# Patient Record
Sex: Male | Born: 1965
Health system: Southern US, Community
[De-identification: ages and names within clinical notes are randomized; demographics above are authoritative.]

## PROBLEM LIST (undated history)

## (undated) DIAGNOSIS — N400 Enlarged prostate without lower urinary tract symptoms: Secondary | ICD-10-CM

## (undated) DIAGNOSIS — R972 Elevated prostate specific antigen [PSA]: Secondary | ICD-10-CM

## (undated) DIAGNOSIS — G4730 Sleep apnea, unspecified: Secondary | ICD-10-CM

## (undated) DIAGNOSIS — C61 Malignant neoplasm of prostate: Secondary | ICD-10-CM

## (undated) DIAGNOSIS — N529 Male erectile dysfunction, unspecified: Secondary | ICD-10-CM

## (undated) DIAGNOSIS — E669 Obesity, unspecified: Secondary | ICD-10-CM

## (undated) DIAGNOSIS — R339 Retention of urine, unspecified: Secondary | ICD-10-CM

## (undated) DIAGNOSIS — I10 Essential (primary) hypertension: Secondary | ICD-10-CM

## (undated) HISTORY — DX: Benign prostatic hyperplasia without lower urinary tract symptoms: N40.0

## (undated) HISTORY — DX: Retention of urine, unspecified: R33.9

## (undated) HISTORY — DX: Male erectile dysfunction, unspecified: N52.9

## (undated) HISTORY — DX: Elevated prostate specific antigen (PSA): R97.20

---
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
## 2000-05-11 ENCOUNTER — Emergency Department (HOSPITAL_COMMUNITY): Admission: EM | Admit: 2000-05-11 | Discharge: 2000-05-11 | Payer: Self-pay | Admitting: Emergency Medicine

## 2002-06-07 ENCOUNTER — Emergency Department (HOSPITAL_COMMUNITY): Admission: EM | Admit: 2002-06-07 | Discharge: 2002-06-07 | Payer: Self-pay | Admitting: Emergency Medicine

## 2002-06-07 ENCOUNTER — Encounter: Payer: Self-pay | Admitting: Emergency Medicine

## 2005-04-09 ENCOUNTER — Emergency Department (HOSPITAL_COMMUNITY): Admission: EM | Admit: 2005-04-09 | Discharge: 2005-04-09 | Payer: Self-pay | Admitting: Emergency Medicine

## 2006-10-01 ENCOUNTER — Emergency Department (HOSPITAL_COMMUNITY): Admission: EM | Admit: 2006-10-01 | Discharge: 2006-10-01 | Payer: Self-pay | Admitting: Emergency Medicine

## 2009-01-28 ENCOUNTER — Emergency Department (HOSPITAL_COMMUNITY): Admission: EM | Admit: 2009-01-28 | Discharge: 2009-01-28 | Payer: Self-pay | Admitting: Emergency Medicine

## 2010-04-02 ENCOUNTER — Emergency Department (HOSPITAL_COMMUNITY): Admission: EM | Admit: 2010-04-02 | Discharge: 2010-04-02 | Payer: Self-pay | Admitting: Emergency Medicine

## 2013-09-20 ENCOUNTER — Encounter (HOSPITAL_COMMUNITY): Payer: Self-pay | Admitting: *Deleted

## 2013-09-20 ENCOUNTER — Emergency Department (HOSPITAL_COMMUNITY)
Admission: EM | Admit: 2013-09-20 | Discharge: 2013-09-20 | Disposition: A | Discharge status: Home / Self Care | Payer: Self-pay | Attending: Emergency Medicine | Admitting: Emergency Medicine

## 2013-09-20 ENCOUNTER — Emergency Department (HOSPITAL_COMMUNITY): Payer: Self-pay

## 2013-09-20 DIAGNOSIS — Z79899 Other long term (current) drug therapy: Secondary | ICD-10-CM | POA: Insufficient documentation

## 2013-09-20 DIAGNOSIS — M7651 Patellar tendinitis, right knee: Secondary | ICD-10-CM

## 2013-09-20 DIAGNOSIS — M25469 Effusion, unspecified knee: Secondary | ICD-10-CM | POA: Insufficient documentation

## 2013-09-20 DIAGNOSIS — E669 Obesity, unspecified: Secondary | ICD-10-CM | POA: Insufficient documentation

## 2013-09-20 DIAGNOSIS — I10 Essential (primary) hypertension: Secondary | ICD-10-CM | POA: Insufficient documentation

## 2013-09-20 DIAGNOSIS — Z7982 Long term (current) use of aspirin: Secondary | ICD-10-CM | POA: Insufficient documentation

## 2013-09-20 DIAGNOSIS — M7650 Patellar tendinitis, unspecified knee: Secondary | ICD-10-CM | POA: Insufficient documentation

## 2013-09-20 HISTORY — DX: Essential (primary) hypertension: I10

## 2013-09-20 MED ORDER — NAPROXEN 500 MG PO TABS: 500.0000 mg | ORAL_TABLET | Freq: Two times a day (BID) | ORAL | Status: DC

## 2013-09-20 MED ORDER — HYDROCODONE-ACETAMINOPHEN 5-325 MG PO TABS: 1.0000 | ORAL_TABLET | Freq: Four times a day (QID) | ORAL | Status: DC | PRN

## 2013-09-20 NOTE — Progress Notes (Signed)
Orthopedic Tech Progress Note Patient Details:  Craig Collins 1966-10-26 [id number]  Ortho Devices Type of Ortho Device: Knee Immobilizer Ortho Device/Splint Location: RLE Ortho Device/Splint Interventions: Ordered;Application   Jennye Moccasin 09/20/2013, 9:02 PM

## 2013-09-20 NOTE — ED Notes (Signed)
Pt reports pain and swelling to right knee that started yesterday, denies injury. No relief with ibuprofen.

## 2013-09-20 NOTE — ED Provider Notes (Signed)
CSN: [id number]     Arrival date & time 09/20/13  1849 History  This chart was scribed for non-physician practitioner Felicie Morn, NP, working with Toy Baker, MD by Ronal Fear, ED scribe. This patient was seen in room TR10C/TR10C and the patient's care was started at 8:19 PM.    Chief Complaint  Patient presents with  . Knee Pain  . Joint Swelling    Patient is a 47 y.o. male presenting with knee pain. The history is provided by the patient. No language interpreter was used.  Knee Pain Location:  Knee Time since incident:  1 day Injury: no   Knee location:  R knee Pain details:    Quality:  Aching   Radiates to:  Does not radiate   Severity:  Mild   Onset quality:  Sudden   Timing:  Intermittent Chronicity:  Recurrent Dislocation: no   Prior injury to area:  No Relieved by:  Nothing Worsened by:  Activity Ineffective treatments:  NSAIDs Associated symptoms: stiffness and swelling   Associated symptoms: no muscle weakness and no numbness    Pt has a hx of high BP Past Medical History  Diagnosis Date  . Obesity   . Hypertension    History reviewed. No pertinent past surgical history. History reviewed. No pertinent family history. History  Substance Use Topics  . Smoking status: Not on file  . Smokeless tobacco: Not on file  . Alcohol Use: No    Review of Systems  Musculoskeletal: Positive for joint swelling and stiffness.  All other systems reviewed and are negative.    Allergies  Review of patient's allergies indicates no known allergies.  Home Medications   Current Outpatient Rx  Name  Route  Sig  Dispense  Refill  . aspirin 325 MG buffered tablet   Oral   Take 325 mg by mouth daily.         . hydrALAZINE (APRESOLINE) 10 MG tablet   Oral   Take 10 mg by mouth 3 (three) times daily.         Marland Kitchen lisinopril (PRINIVIL,ZESTRIL) 20 MG tablet   Oral   Take 20 mg by mouth daily.          BP 138/90  Pulse 86  Temp(Src) 98.4 F (36.9 C) (Oral)   Resp 19  SpO2 97% Physical Exam  Nursing note and vitals reviewed. Constitutional: He is oriented to person, place, and time. He appears well-developed and well-nourished. No distress.  HENT:  Head: Normocephalic and atraumatic.  Eyes: EOM are normal.  Neck: Neck supple. No tracheal deviation present.  Cardiovascular: Normal rate.   Pulmonary/Chest: Effort normal. No respiratory distress.  Musculoskeletal: Normal range of motion. He exhibits tenderness.  Left knee patella tendon tenderness with inflammation. Not red or warm  Neurological: He is alert and oriented to person, place, and time.  Skin: Skin is warm and dry.  Psychiatric: He has a normal mood and affect. His behavior is normal.    ED Course  Procedures (including critical care time)  DIAGNOSTIC STUDIES: Oxygen Saturation is 97% on RA, adequate by my interpretation.    COORDINATION OF CARE: 8:21 PM- Pt advised of plan for treatment including immobilizer, narcotic pain medication, anti inflammatory medication and follow-up with PCP and pt agrees.       Labs Review Labs Reviewed - No data to display Imaging Review Dg Knee Complete 4 Views Right  09/20/2013   CLINICAL DATA:  Right knee pain and swelling.  EXAM: RIGHT KNEE - COMPLETE 4+ VIEW  COMPARISON:  None.  FINDINGS: There is no evidence of fracture, dislocation, or joint effusion. Minimal proliferative changes are seen involving tibial spines and the superior patella. No bony lesions or destruction identified. Soft tissues are unremarkable.  IMPRESSION: Minimal degenerative changes. No acute findings.   Electronically Signed   By: Irish Lack   On: 09/20/2013 20:01   Radiology results reviewed and shared with patient. MDM  Right knee pain, suspect patellar tendonitis.   I personally performed the services described in this documentation, which was scribed in my presence. The recorded information has been reviewed and is accurate.    Jimmye Norman,  NP 09/21/13 [phone]

## 2013-09-23 NOTE — ED Provider Notes (Signed)
Medical screening examination/treatment/procedure(s) were performed by non-physician practitioner and as supervising physician I was immediately available for consultation/collaboration.  Toy Baker, MD 09/23/13 [phone]

## 2013-12-31 ENCOUNTER — Emergency Department (HOSPITAL_COMMUNITY)
Admission: EM | Admit: 2013-12-31 | Discharge: 2013-12-31 | Disposition: A | Discharge status: Home / Self Care | Payer: Self-pay | Attending: Emergency Medicine | Admitting: Emergency Medicine

## 2013-12-31 ENCOUNTER — Emergency Department (HOSPITAL_COMMUNITY): Payer: Self-pay

## 2013-12-31 ENCOUNTER — Encounter (HOSPITAL_COMMUNITY): Payer: Self-pay | Admitting: Emergency Medicine

## 2013-12-31 DIAGNOSIS — E669 Obesity, unspecified: Secondary | ICD-10-CM | POA: Insufficient documentation

## 2013-12-31 DIAGNOSIS — Z79899 Other long term (current) drug therapy: Secondary | ICD-10-CM | POA: Insufficient documentation

## 2013-12-31 DIAGNOSIS — Z791 Long term (current) use of non-steroidal anti-inflammatories (NSAID): Secondary | ICD-10-CM | POA: Insufficient documentation

## 2013-12-31 DIAGNOSIS — M25476 Effusion, unspecified foot: Secondary | ICD-10-CM | POA: Insufficient documentation

## 2013-12-31 DIAGNOSIS — Z7982 Long term (current) use of aspirin: Secondary | ICD-10-CM | POA: Insufficient documentation

## 2013-12-31 DIAGNOSIS — M25572 Pain in left ankle and joints of left foot: Secondary | ICD-10-CM

## 2013-12-31 DIAGNOSIS — M25473 Effusion, unspecified ankle: Secondary | ICD-10-CM | POA: Insufficient documentation

## 2013-12-31 DIAGNOSIS — M6580 Other synovitis and tenosynovitis, unspecified site: Secondary | ICD-10-CM | POA: Insufficient documentation

## 2013-12-31 DIAGNOSIS — I10 Essential (primary) hypertension: Secondary | ICD-10-CM | POA: Insufficient documentation

## 2013-12-31 DIAGNOSIS — M67969 Unspecified disorder of synovium and tendon, unspecified lower leg: Secondary | ICD-10-CM

## 2013-12-31 DIAGNOSIS — M6790 Unspecified disorder of synovium and tendon, unspecified site: Secondary | ICD-10-CM

## 2013-12-31 HISTORY — DX: Sleep apnea, unspecified: G47.30

## 2013-12-31 MED ORDER — MELOXICAM 15 MG PO TABS: 15.0000 mg | ORAL_TABLET | Freq: Every day | ORAL | Status: DC

## 2013-12-31 NOTE — ED Provider Notes (Signed)
Medical screening examination/treatment/procedure(s) were performed by non-physician practitioner and as supervising physician I was immediately available for consultation/collaboration.  EKG Interpretation   None         Elmer Sow, MD 12/31/13 2103

## 2013-12-31 NOTE — ED Provider Notes (Signed)
CSN: [id number]     Arrival date & time 12/31/13  75 History   First MD Initiated Contact with Patient 12/31/13 1116     Chief Complaint  Patient presents with  . Foot Pain   (Consider location/radiation/quality/duration/timing/severity/associated sxs/prior Treatment) HPI Comments: This is patient with several days of left ankle pain.  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  He states that the pain in his left ankle is in the anterior portion.  It is worse with standing and walking, relieved with rest and ice.  The patient also has noticed some swelling.  He said his ankle is very tender to palpation.  He thought he might have gout however he was tested at his primary care providers for elevated uric acid and did not have it.  The patient denies any history of gonococcal infection or urethritis.  She denies fevers, chills, myalgias, arthralgias.  No history of DVT, no unilateral calf swelling or pain.  Patient is a 48 y.o. male presenting with ankle pain.  Ankle Pain Location:  Ankle and foot Injury: no   Ankle location:  L ankle (anterior tendons) Foot location:  Dorsum of L foot Pain details:    Quality:  Aching and shooting   Radiates to:  L leg   Severity:  Moderate   Progression:  Unchanged Prior injury to area:  No Relieved by:  NSAIDs, rest and ice Worsened by:  Bearing weight and activity (flex/ext/inversion of ankle, flexion of big toe) Associated symptoms: decreased ROM and swelling   Associated symptoms: no back pain, no fatigue, no fever, no itching, no muscle weakness, no neck pain, no numbness and no tingling     Past Medical History  Diagnosis Date  . Obesity   . Hypertension   . Sleep apnea    History reviewed. No pertinent past surgical history. History reviewed. No pertinent family history. History  Substance Use Topics  . Smoking status: Never Smoker   . Smokeless tobacco: Not on file  . Alcohol Use: No    Review of  Systems  Constitutional: Negative for fever and fatigue.  Musculoskeletal: Negative for back pain and neck pain.  Skin: Negative for itching and rash.    Allergies  Review of patient's allergies indicates no known allergies.  Home Medications   Current Outpatient Rx  Name  Route  Sig  Dispense  Refill  . aspirin 325 MG buffered tablet   Oral   Take 325 mg by mouth daily.         . hydrALAZINE (APRESOLINE) 10 MG tablet   Oral   Take 10 mg by mouth 3 (three) times daily.         Marland Kitchen HYDROcodone-acetaminophen (NORCO/VICODIN) 5-325 MG per tablet   Oral   Take 1 tablet by mouth every 6 (six) hours as needed for pain (for pain not controlled with naprosyn).   10 tablet   0   . lisinopril (PRINIVIL,ZESTRIL) 20 MG tablet   Oral   Take 20 mg by mouth daily.         . naproxen (NAPROSYN) 500 MG tablet   Oral   Take 1 tablet (500 mg total) by mouth 2 (two) times daily.   20 tablet   0    BP 130/82  Pulse 90  Temp(Src) 98.6 F (37 C) (Oral)  Resp 22  SpO2 98% Physical Exam Physical Exam  Nursing note and vitals reviewed. Constitutional: He appears well-developed  and well-nourished. No distress.  obese HENT:  Head: Normocephalic and atraumatic.  Eyes: Conjunctivae normal are normal. No scleral icterus.  Neck: Normal range of motion. Neck supple.  Cardiovascular: Normal rate, regular rhythm and normal heart sounds.   Pulmonary/Chest: Effort normal and breath sounds normal. No respiratory distress.  Abdominal: Soft. There is no tenderness.  Musculoskeletal: He exhibits no edema.  Neurological: He is alert.  Skin: Skin is warm and dry. He is not diaphoretic.  Musculoskeletal: Patient with limited range of motion of the left ankle due to pain.  He is exquisitely tender to palpation over the anterior ankle flexors.  Patient has pain with flexion extension and inversion of the ankle.  No medial malleoli are tenderness.  Distal pulses and sensation are intact.  There is  minimal swelling, no redness, no signs of infection.  Antalgic gait.   Psychiatric: His behavior is normal.    ED Course  Procedures (including critical care time) Labs Review Labs Reviewed - No data to display Imaging Review No results found.  EKG Interpretation   None       MDM   1. Ankle pain, left   2. Lower extremity tendinopathy    Patient with complaint of left foot and ankle pain.  I suspect the anterior ankle tendinopathy.  The patient will have x-ray of the ankle.   Filed Vitals:   12/31/13 1108 12/31/13 1131 12/31/13 1230 12/31/13 1307  BP: 149/90 130/82 120/74 117/81  Pulse: 87 90 79 77  Temp: 99.1 F (37.3 C) 98.6 F (37 C)    TempSrc: Oral Oral    Resp: 16 22 20 20   SpO2: 96% 98% 98% 95%   Patient xray negative for acute abnormality I personally reviewed the images using our PACS system. Patient will be discharged with conservative treatment for tendinopathy including Cam Walker boot.  He is advised to ice 3 times a day along with high-dose anti-inflammatories for 10 days.  I have advised patient to follow up with PCP and podiatry regarding his likely tendinopathy. The patient appears reasonably screened and/or stabilized for discharge and I doubt any other medical condition or other [facility] requiring further screening, evaluation, or treatment in the ED at this time prior to discharge.    Margarita Mail, PA-C 12/31/13 1347

## 2013-12-31 NOTE — Progress Notes (Signed)
Orthopedic Tech Progress Note Patient Details:  Craig Collins Aug 06, 1966 [id number]  Ortho Devices Type of Ortho Device: CAM walker Ortho Device/Splint Location: lle Ortho Device/Splint Interventions: Application   Lareen Mullings 12/31/2013, 1:43 PM

## 2013-12-31 NOTE — Discharge Instructions (Signed)
Please wear the walking boot during waking hours. You ma remove the boot for sleep. Ice the affected area 3 times daily. Take the mobic in the morning with food.  follow up with podiatry   Ankle Pain Ankle pain is a common symptom. The bones, cartilage, tendons, and muscles of the ankle joint perform a lot of work each day. The ankle joint holds your body weight and allows you to move around. Ankle pain can occur on either side or back of 1 or both ankles. Ankle pain may be sharp and burning or dull and aching. There may be tenderness, stiffness, redness, or warmth around the ankle. The pain occurs more often when a person walks or puts pressure on the ankle. CAUSES  There are many reasons ankle pain can develop. It is important to work with your caregiver to identify the cause since many conditions can impact the bones, cartilage, muscles, and tendons. Causes for ankle pain include:  Injury, including a break (fracture), sprain, or strain often due to a fall, sports, or a high-impact activity.  Swelling (inflammation) of a tendon (tendonitis).  Achilles tendon rupture.  Ankle instability after repeated sprains and strains.  Poor foot alignment.  Pressure on a nerve (tarsal tunnel syndrome).  Arthritis in the ankle or the lining of the ankle.  Crystal formation in the ankle (gout or pseudogout). DIAGNOSIS  A diagnosis is based on your medical history, your symptoms, results of your physical exam, and results of diagnostic tests. Diagnostic tests may include X-ray exams or a computerized magnetic scan (magnetic resonance imaging, MRI). TREATMENT  Treatment will depend on the cause of your ankle pain and may include:  Keeping pressure off the ankle and limiting activities.  Using crutches or other walking support (a cane or brace).  Using rest, ice, compression, and elevation.  Participating in physical therapy or home exercises.  Wearing shoe inserts or special shoes.  Losing  weight.  Taking medications to reduce pain or swelling or receiving an injection.  Undergoing surgery. HOME CARE INSTRUCTIONS   Only take over-the-counter or prescription medicines for pain, discomfort, or fever as directed by your caregiver.  Put ice on the injured area.  Put ice in a plastic bag.  Place a towel between your skin and the bag.  Leave the ice on for 15-20 minutes at a time, 03-04 times a day.  Keep your leg raised (elevated) when possible to lessen swelling.  Avoid activities that cause ankle pain.  Follow specific exercises as directed by your caregiver.  Record how often you have ankle pain, the location of the pain, and what it feels like. This information may be helpful to you and your caregiver.  Ask your caregiver about returning to work or sports and whether you should drive.  Follow up with your caregiver for further examination, therapy, or testing as directed. SEEK MEDICAL CARE IF:   Pain or swelling continues or worsens beyond 1 week.  You have an oral temperature above 102 F (38.9 C).  You are feeling unwell or have chills.  You are having an increasingly difficult time with walking.  You have loss of sensation or other new symptoms.  You have questions or concerns. MAKE SURE YOU:   Understand these instructions.  Will watch your condition.  Will get help right away if you are not doing well or get worse. Document Released: 05/28/2010 Document Revised: 03/01/2012 Document Reviewed: 05/28/2010 [facility] Patient Information 2014 [place].

## 2013-12-31 NOTE — ED Notes (Addendum)
Pt c/o L foot and ankle pain and swelling for past several days. Reports he was here in past for similar pain and diagnosed with tendon inflammation. 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 Taking ibuprofen with some relief. Hurts worse to elevate

## 2014-01-05 ENCOUNTER — Encounter (HOSPITAL_COMMUNITY): Payer: Self-pay | Admitting: Emergency Medicine

## 2014-01-05 ENCOUNTER — Emergency Department (HOSPITAL_COMMUNITY)
Admission: EM | Admit: 2014-01-05 | Discharge: 2014-01-05 | Disposition: A | Discharge status: Home / Self Care | Payer: Self-pay | Attending: Emergency Medicine | Admitting: Emergency Medicine

## 2014-01-05 DIAGNOSIS — M25579 Pain in unspecified ankle and joints of unspecified foot: Secondary | ICD-10-CM | POA: Insufficient documentation

## 2014-01-05 DIAGNOSIS — M7989 Other specified soft tissue disorders: Secondary | ICD-10-CM

## 2014-01-05 DIAGNOSIS — Z791 Long term (current) use of non-steroidal anti-inflammatories (NSAID): Secondary | ICD-10-CM | POA: Insufficient documentation

## 2014-01-05 DIAGNOSIS — M25572 Pain in left ankle and joints of left foot: Secondary | ICD-10-CM

## 2014-01-05 DIAGNOSIS — Z7982 Long term (current) use of aspirin: Secondary | ICD-10-CM | POA: Insufficient documentation

## 2014-01-05 DIAGNOSIS — Z79899 Other long term (current) drug therapy: Secondary | ICD-10-CM | POA: Insufficient documentation

## 2014-01-05 DIAGNOSIS — E669 Obesity, unspecified: Secondary | ICD-10-CM | POA: Insufficient documentation

## 2014-01-05 DIAGNOSIS — M25473 Effusion, unspecified ankle: Secondary | ICD-10-CM | POA: Insufficient documentation

## 2014-01-05 DIAGNOSIS — M25476 Effusion, unspecified foot: Principal | ICD-10-CM | POA: Insufficient documentation

## 2014-01-05 DIAGNOSIS — I10 Essential (primary) hypertension: Secondary | ICD-10-CM | POA: Insufficient documentation

## 2014-01-05 MED ORDER — IBUPROFEN 800 MG PO TABS: 800.0000 mg | ORAL_TABLET | Freq: Three times a day (TID) | ORAL | Status: DC

## 2014-01-05 MED ORDER — OXYCODONE-ACETAMINOPHEN 5-325 MG PO TABS
2.0000 | ORAL_TABLET | Freq: Once | ORAL | Status: AC
  Administered 2014-01-05: 2 via ORAL
  Filled 2014-01-05: qty 2

## 2014-01-05 MED ORDER — CEPHALEXIN 500 MG PO CAPS: 500.0000 mg | ORAL_CAPSULE | Freq: Four times a day (QID) | ORAL | Status: DC

## 2014-01-05 MED ORDER — OXYCODONE-ACETAMINOPHEN 5-325 MG PO TABS: 2.0000 | ORAL_TABLET | ORAL | Status: DC | PRN

## 2014-01-05 MED ORDER — CYCLOBENZAPRINE HCL 10 MG PO TABS: 10.0000 mg | ORAL_TABLET | Freq: Two times a day (BID) | ORAL | Status: DC | PRN

## 2014-01-05 NOTE — ED Provider Notes (Signed)
Medical screening examination/treatment/procedure(s) were conducted as a shared visit with non-physician practitioner(s) and myself.  I personally evaluated the patient during the encounter.  EKG Interpretation   None      Patient with left foot swelling and tenderness. Seen a few days ago, told he had a tendinopathy. No relief after using a Cam Walker. Patient has had this before and it was an unknown etiology. He was worked up for gout and it was not gout. He denies any trauma. He denies any fever or systemic symptoms. His left foot is swollen, but his foot is soft. No tight compartments. No concern for compartment syndrome. Skin is not red, there are no cellulitic changes. No concern for infection. He did say he did have an infection once and was treated with antibiotics and improved. He has good cap refill distally and good sensation. I'm unsure of the etiology of his foot swelling. He had a negative x-ray 2 days ago. Patient given pain medicine, anti-inflammatories. He is also given Keflex in case this becomes red, he understands to not fill it if it does not needed. Patient stable for discharge. Given orthopedic followup  Osvaldo Shipper, MD 01/05/14 2328

## 2014-01-05 NOTE — ED Notes (Signed)
Pt was seen here on 1/10 for left foot and ankle pain. Had xray done and sent home with meds and cam walker on left foot. Reports no relief with meds and increase in pain and swelling.

## 2014-01-05 NOTE — ED Notes (Signed)
PA at bedside.

## 2014-01-05 NOTE — Discharge Instructions (Signed)
1. Medications: percocet, flexeril, ibuprofen, Keflex (only fill this if your foot becomes red), usual home medications 2. Treatment: rest, drink plenty of fluids, gentle stretching as discussed, use ice 3. Follow Up: Please followup with your primary doctor for discussion of your diagnoses and further evaluation after today's visit; if you do not have a primary care doctor use the resource guide provided to find one;   Arthralgia Your caregiver has diagnosed you as suffering from an arthralgia. Arthralgia means there is pain in a joint. This can come from many reasons including:  Bruising the joint which causes soreness (inflammation) in the joint.  Wear and tear on the joints which occur as we grow older (osteoarthritis).  Overusing the joint.  Various forms of arthritis.  Infections of the joint. Regardless of the cause of pain in your joint, most of these different pains respond to anti-inflammatory drugs and rest. The exception to this is when a joint is infected, and these cases are treated with antibiotics, if it is a bacterial infection. HOME CARE INSTRUCTIONS   Rest the injured area for as long as directed by your caregiver. Then slowly start using the joint as directed by your caregiver and as the pain allows. Crutches as directed may be useful if the ankles, knees or hips are involved. If the knee was splinted or casted, continue use and care as directed. If an stretchy or elastic wrapping bandage has been applied today, it should be removed and re-applied every 3 to 4 hours. It should not be applied tightly, but firmly enough to keep swelling down. Watch toes and feet for swelling, bluish discoloration, coldness, numbness or excessive pain. If any of these problems (symptoms) occur, remove the ace bandage and re-apply more loosely. If these symptoms persist, contact your caregiver or return to this location.  For the first 24 hours, keep the injured extremity elevated on pillows  while lying down.  Apply ice for 15-20 minutes to the sore joint every couple hours while awake for the first half day. Then 03-04 times per day for the first 48 hours. Put the ice in a plastic bag and place a towel between the bag of ice and your skin.  Wear any splinting, casting, elastic bandage applications, or slings as instructed.  Only take over-the-counter or prescription medicines for pain, discomfort, or fever as directed by your caregiver. Do not use aspirin immediately after the injury unless instructed by your physician. Aspirin can cause increased bleeding and bruising of the tissues.  If you were given crutches, continue to use them as instructed and do not resume weight bearing on the sore joint until instructed. Persistent pain and inability to use the sore joint as directed for more than 2 to 3 days are warning signs indicating that you should see a caregiver for a follow-up visit as soon as possible. Initially, a hairline fracture (break in bone) may not be evident on X-rays. Persistent pain and swelling indicate that further evaluation, non-weight bearing or use of the joint (use of crutches or slings as instructed), or further X-rays are indicated. X-rays may sometimes not show a small fracture until a week or 10 days later. Make a follow-up appointment with your own caregiver or one to whom we have referred you. A radiologist (specialist in reading X-rays) may read your X-rays. Make sure you know how you are to obtain your X-ray results. Do not assume everything is normal if you do not hear from [place]. SEEK MEDICAL CARE IF:  Bruising, swelling, or pain increases. SEEK IMMEDIATE MEDICAL CARE IF:   Your fingers or toes are numb or blue.  The pain is not responding to medications and continues to stay the same or get worse.  The pain in your joint becomes severe.  You develop a fever over 102 F (38.9 C).  It becomes impossible to move or use the joint. MAKE SURE YOU:    Understand these instructions.  Will watch your condition.  Will get help right away if you are not doing well or get worse. Document Released: 12/08/2005 Document Revised: 03/01/2012 Document Reviewed: 07/26/2008 [facility] Patient Information 2014 [place].

## 2014-01-05 NOTE — ED Provider Notes (Signed)
CSN: [id number]     Arrival date & time 01/05/14  1755 History   First MD Initiated Contact with Patient 01/05/14 1921     Chief Complaint  Patient presents with  . Leg Swelling   (Consider location/radiation/quality/duration/timing/severity/associated sxs/prior Treatment) The history is provided by the patient and medical records. No language interpreter was used.    Craig Collins is a 48 y.o. male  with a hx of obesity, hypertension, sleep apnea presents to the Emergency Department complaining of gradual, persistent, progressively worsening left foot pain onset approximately 1 week ago. Associated symptoms include swelling and pain with ambulation.  Patient has attended taking meloxicam and Naprosyn without improvement. Nothing makes it better and walking makes it worse.  Pt denies fever, chills, headache, neck pain, chest pain, shortness of breath abdominal pain, nausea, vomiting, swelling of the legs, numbness, tingling, weakness, syncope.  Record review shows the patient was seen on 01/01/2012 with negative x-ray. I have personally reviewed this imaging.  He was diagnosed with tendinopathy that time and placed in a cam walker. Patient reports this is making his pain worse.  He reports 4 episodes the last 6 months of similar swelling and pain but all less intense than today's symptoms..     Past Medical History  Diagnosis Date  . Obesity   . Hypertension   . Sleep apnea    History reviewed. No pertinent past surgical history. History reviewed. No pertinent family history. History  Substance Use Topics  . Smoking status: Never Smoker   . Smokeless tobacco: Not on file  . Alcohol Use: No    Review of Systems  Constitutional: Negative for fever and chills.  Gastrointestinal: Negative for nausea and vomiting.  Musculoskeletal: Positive for arthralgias and joint swelling. Negative for back pain, neck pain and neck stiffness.  Skin: Negative for wound.  Neurological: Negative for  numbness.  Hematological: Does not bruise/bleed easily.  Psychiatric/Behavioral: The patient is not nervous/anxious.   All other systems reviewed and are negative.    Allergies  Review of patient's allergies indicates no known allergies.  Home Medications   Current Outpatient Rx  Name  Route  Sig  Dispense  Refill  . aspirin EC 81 MG tablet   Oral   Take 81 mg by mouth daily.         . hydrALAZINE (APRESOLINE) 10 MG tablet   Oral   Take 10 mg by mouth daily.          Marland Kitchen lisinopril (PRINIVIL,ZESTRIL) 20 MG tablet   Oral   Take 20 mg by mouth daily.         . meloxicam (MOBIC) 15 MG tablet   Oral   Take 1 tablet (15 mg total) by mouth daily. Take 1 daily with food.   10 tablet   0   . naproxen (NAPROSYN) 500 MG tablet   Oral   Take 500 mg by mouth 2 (two) times daily.         . cephALEXin (KEFLEX) 500 MG capsule   Oral   Take 1 capsule (500 mg total) by mouth 4 (four) times daily.   40 capsule   0   . cyclobenzaprine (FLEXERIL) 10 MG tablet   Oral   Take 1 tablet (10 mg total) by mouth 2 (two) times daily as needed for muscle spasms.   20 tablet   0   . ibuprofen (ADVIL,MOTRIN) 800 MG tablet   Oral   Take 1 tablet (800 mg total)  by mouth 3 (three) times daily.   21 tablet   0   . oxyCODONE-acetaminophen (PERCOCET/ROXICET) 5-325 MG per tablet   Oral   Take 2 tablets by mouth every 4 (four) hours as needed for severe pain.   6 tablet   0    BP 118/75  Pulse 101  Temp(Src) 98.1 F (36.7 C) (Oral)  Resp 22  SpO2 99% Physical Exam  Nursing note and vitals reviewed. Constitutional: He is oriented to person, place, and time. He appears well-developed and well-nourished. No distress.  HENT:  Head: Normocephalic and atraumatic.  Eyes: Conjunctivae are normal.  Neck: Normal range of motion.  Cardiovascular: Normal rate, regular rhythm, normal heart sounds and intact distal pulses.   No murmur heard. Capillary refill less than 3 seconds   Pulmonary/Chest: Effort normal and breath sounds normal.  Musculoskeletal: He exhibits edema and tenderness.  ROM: Decreased range of motion of the left ankle secondary to pain in the dorsum of the foot, decreased range of motion in the toes for the same reason, full range of motion in the knee  1+ pitting edema in the dorsum of the foot but not in the calf ankle or toes  Neurological: He is alert and oriented to person, place, and time. Coordination normal.  Sensation intact to dull and sharp Strength 2/5 in the left foot and ankle including dorsiflexion and plantar flexion secondary to pain, 5 out of 5 in the right foot and ankle  Skin: Skin is warm and dry. He is not diaphoretic.  No tenting of the skin  Psychiatric: He has a normal mood and affect.    ED Course  Procedures (including critical care time) Labs Review Labs Reviewed - No data to display Imaging Review No results found.  EKG Interpretation   None       MDM   1. Foot swelling   2. Arthralgia of foot, left      [name] presents with persistent and worsening arthralgia and swelling of the left foot.  Patient without erythema or increased warmth. No evidence of cellulitis as patient has no induration or fluctuance. No evidence of gout and patient has no history of the same. Patient denies trauma and with negative x-rays no indication for fracture.   Unknown etiology of patient's pain and swelling. Patient without calf tenderness or calf pain. No evidence of DVT. Pain managed in ED. Pt advised to follow up with PCP for further evaluation. Patient given post-op shoe and crutches while in ED, conservative therapy recommended and discussed. Patient will be dc home & is agreeable with above plan.  It has been determined that no acute conditions requiring further emergency intervention are present at this time. The patient/guardian have been advised of the diagnosis and plan. We have discussed signs and symptoms  that warrant return to the ED, such as changes or worsening in symptoms.   Vital signs are stable at discharge.   BP 118/75  Pulse 101  Temp(Src) 98.1 F (36.7 C) (Oral)  Resp 22  SpO2 99%  Patient/guardian has voiced understanding and agreed to follow-up with the PCP or specialist.    I personally performed the services described in this documentation, which was scribed in my presence. The recorded information has been reviewed and is accurate.      Jarrett Soho Slater Mcmanaman, PA-C 01/05/14 2102

## 2014-10-15 ENCOUNTER — Encounter (HOSPITAL_COMMUNITY): Payer: Self-pay | Admitting: Emergency Medicine

## 2014-10-15 ENCOUNTER — Emergency Department (HOSPITAL_COMMUNITY)
Admission: EM | Admit: 2014-10-15 | Discharge: 2014-10-15 | Disposition: A | Discharge status: Home / Self Care | Payer: Self-pay | Attending: Emergency Medicine | Admitting: Emergency Medicine

## 2014-10-15 DIAGNOSIS — Z8669 Personal history of other diseases of the nervous system and sense organs: Secondary | ICD-10-CM | POA: Insufficient documentation

## 2014-10-15 DIAGNOSIS — Z7982 Long term (current) use of aspirin: Secondary | ICD-10-CM | POA: Insufficient documentation

## 2014-10-15 DIAGNOSIS — Z791 Long term (current) use of non-steroidal anti-inflammatories (NSAID): Secondary | ICD-10-CM | POA: Insufficient documentation

## 2014-10-15 DIAGNOSIS — I10 Essential (primary) hypertension: Secondary | ICD-10-CM | POA: Insufficient documentation

## 2014-10-15 DIAGNOSIS — E669 Obesity, unspecified: Secondary | ICD-10-CM | POA: Insufficient documentation

## 2014-10-15 DIAGNOSIS — M25462 Effusion, left knee: Secondary | ICD-10-CM | POA: Insufficient documentation

## 2014-10-15 DIAGNOSIS — M25562 Pain in left knee: Secondary | ICD-10-CM | POA: Insufficient documentation

## 2014-10-15 DIAGNOSIS — Z792 Long term (current) use of antibiotics: Secondary | ICD-10-CM | POA: Insufficient documentation

## 2014-10-15 DIAGNOSIS — Z79899 Other long term (current) drug therapy: Secondary | ICD-10-CM | POA: Insufficient documentation

## 2014-10-15 DIAGNOSIS — R52 Pain, unspecified: Secondary | ICD-10-CM

## 2014-10-15 MED ORDER — HYDROCODONE-ACETAMINOPHEN 5-325 MG PO TABS: ORAL_TABLET | ORAL | Status: DC

## 2014-10-15 MED ORDER — MELOXICAM 15 MG PO TABS: 15.0000 mg | ORAL_TABLET | Freq: Every day | ORAL | Status: DC

## 2014-10-15 MED ORDER — HYDROCODONE-ACETAMINOPHEN 5-325 MG PO TABS
1.0000 | ORAL_TABLET | Freq: Once | ORAL | Status: AC
  Administered 2014-10-15: 1 via ORAL
  Filled 2014-10-15: qty 1

## 2014-10-15 NOTE — ED Provider Notes (Signed)
CSN: [id number]     Arrival date & time 10/15/14  1259 History  This chart was scribed for Carlisle Cater, PA-C, working with Blanchie Dessert, MD by Steva Colder, ED Scribe. The patient was seen in room TR05C/TR05C at 1:22 PM.    Chief Complaint  Patient presents with  . Knee Pain    The history is provided by the patient. No language interpreter was used.   HPI Comments: Craig Collins is a 48 y.o. male who presents to the Emergency Department complaining of left knee pain onset 3 days. He states that he has been working out in [employer] on the treadmill. He states that he just started back working out for the past 3 weeks. He states that the knee feels better when he is standing just not while he is sitting. He states that he is not able to bend the knee. He states that the last time he was here, he was informed that he didn't have gout. He states that he is having associated symptoms of joint swelling. He states that he has tried Ibuprofen and Muscle relaxer medication with mild relief for his symptoms. He states that he last took one at 6 this morning. He denies symptoms in toes and any other symptoms. He states that he does not have an Orthopedic. He states that he takes medications for his HTN and they are lisinopril and hydralazine. He denies kidney and liver issues.   Past Medical History  Diagnosis Date  . Obesity   . Hypertension   . Sleep apnea    History reviewed. No pertinent past surgical history. History reviewed. No pertinent family history. History  Substance Use Topics  . Smoking status: Never Smoker   . Smokeless tobacco: Not on file  . Alcohol Use: Yes     Comment: occ    Review of Systems  Constitutional: Negative for activity change.  Musculoskeletal: Positive for arthralgias (left knee pain), gait problem and joint swelling (right knee). Negative for back pain and neck pain.  Skin: Negative for wound.  Neurological: Negative for weakness and numbness.       Allergies  Review of patient's allergies indicates no known allergies.  Home Medications   Prior to Admission medications   Medication Sig Start Date End Date Taking? Authorizing Provider  aspirin EC 81 MG tablet Take 81 mg by mouth daily.    Historical Provider, MD  cephALEXin (KEFLEX) 500 MG capsule Take 1 capsule (500 mg total) by mouth 4 (four) times daily. 01/05/14   Hannah Muthersbaugh, PA-C  cyclobenzaprine (FLEXERIL) 10 MG tablet Take 1 tablet (10 mg total) by mouth 2 (two) times daily as needed for muscle spasms. 01/05/14   Hannah Muthersbaugh, PA-C  hydrALAZINE (APRESOLINE) 10 MG tablet Take 10 mg by mouth daily.     Historical Provider, MD  ibuprofen (ADVIL,MOTRIN) 800 MG tablet Take 1 tablet (800 mg total) by mouth 3 (three) times daily. 01/05/14   Hannah Muthersbaugh, PA-C  lisinopril (PRINIVIL,ZESTRIL) 20 MG tablet Take 20 mg by mouth daily.    Historical Provider, MD  meloxicam (MOBIC) 15 MG tablet Take 1 tablet (15 mg total) by mouth daily. Take 1 daily with food. 12/31/13   Margarita Mail, PA-C  naproxen (NAPROSYN) 500 MG tablet Take 500 mg by mouth 2 (two) times daily. 09/20/13   Norman Herrlich, NP  oxyCODONE-acetaminophen (PERCOCET/ROXICET) 5-325 MG per tablet Take 2 tablets by mouth every 4 (four) hours as needed for severe pain. 01/05/14  Hannah Muthersbaugh, PA-C   BP 148/102  Pulse 98  Temp(Src) 97.8 F (36.6 C) (Oral)  Resp 20  SpO2 95%  Physical Exam  Nursing note and vitals reviewed. Constitutional: He appears well-developed and well-nourished. No distress.  HENT:  Head: Normocephalic and atraumatic.  Eyes: Conjunctivae and EOM are normal.  Neck: Normal range of motion. Neck supple. No tracheal deviation present.  Cardiovascular: Normal rate and normal pulses.   Pulses:      Dorsalis pedis pulses are 2+ on the left side.       Posterior tibial pulses are 2+ on the left side.  Pulmonary/Chest: Effort normal. No respiratory distress.   Musculoskeletal: He exhibits tenderness. He exhibits no edema.       Left knee: He exhibits decreased range of motion (slightly decreased in flexion) and effusion (mild). He exhibits no ecchymosis, no deformity, no erythema and no bony tenderness. Tenderness found. Medial joint line and lateral joint line tenderness noted.       Left ankle: Normal.       Left upper leg: Normal.       Left lower leg: Normal.       Left foot: Normal. He exhibits normal range of motion and no tenderness.  Neurological: He is alert. No sensory deficit.  Motor, sensation, and vascular distal to the injury is fully intact.   Skin: Skin is warm and dry.  Psychiatric: He has a normal mood and affect. His behavior is normal.    ED Course  Procedures (including critical care time) DIAGNOSTIC STUDIES: Oxygen Saturation is 95% on room air, normal by my interpretation.    COORDINATION OF CARE: 1:28 PM-Discussed treatment plan which includes recheck in 1 week if the symptoms persists with an Orthopedist, Meloxicam, and Vicodin with pt at bedside and pt agreed to plan.   Labs Review Labs Reviewed - No data to display  Imaging Review No results found.   EKG Interpretation None      Vital signs reviewed and are as follows: Filed Vitals:   10/15/14 1348  BP: 154/104  Pulse: 99  Temp:   Resp: 20   Patient was counseled on RICE protocol and told to rest injury, use ice for no longer than 15 minutes every hour, compress the area, and elevate above the level of their heart as much as possible to reduce swelling. Questions answered. Patient verbalized understanding.    Patient counseled on use of narcotic pain medications. Counseled not to combine these medications with others containing tylenol. Urged not to drink alcohol, drive, or perform any other activities that requires focus while taking these medications. The patient verbalizes understanding and agrees with the plan.    MDM   Final diagnoses:  Pain  aggravated by activities of daily living  Knee pain, acute, left  Knee effusion, left   Patient with knee effusion, likely secondary to overuse injury from recent new exercise. Patient likely has osteoarthritis. No significant decreased range of motion or erythema, other systemic symptoms to suggest septic arthritis. Gout/pseudogout is considered possible, but less likely.  I personally performed the services described in this documentation, which was scribed in my presence. The recorded information has been reviewed and is accurate.    Carlisle Cater, PA-C 10/15/14 1415

## 2014-10-15 NOTE — ED Provider Notes (Signed)
Medical screening examination/treatment/procedure(s) were performed by non-physician practitioner and as supervising physician I was immediately available for consultation/collaboration.   EKG Interpretation None        Blanchie Dessert, MD 10/15/14 1623

## 2014-10-15 NOTE — ED Notes (Signed)
Pt c/o left knee pain x 3 days; pt sts hx of chronic issues with same knee; pt unsure of obvious injury and some swelling noted

## 2014-10-15 NOTE — Discharge Instructions (Signed)
Please read and follow all provided instructions.  Your diagnoses today include:  1. Pain aggravated by activities of daily living   2. Knee pain, acute, left   3. Knee effusion, left     Tests performed today include:  Vital signs. See below for your results today.   Medications prescribed:   Vicodin (hydrocodone/acetaminophen) - narcotic pain medication  DO NOT drive or perform any activities that require you to be awake and alert because this medicine can make you drowsy. BE VERY CAREFUL not to take multiple medicines containing Tylenol (also called acetaminophen). Doing so can lead to an overdose which can damage your liver and cause liver failure and possibly death.   Meloxicam - anti-inflammatory pain medication  You have been prescribed an anti-inflammatory medication or NSAID. Take with food. Do not take aspirin, ibuprofen, or naproxen if taking this medication. Take smallest effective dose for the shortest duration needed for your pain. Stop taking if you experience stomach pain or vomiting.   Take any prescribed medications only as directed.  Home care instructions:   Follow any educational materials contained in this packet  Follow R.I.C.E. Protocol:  R - rest your injury   I  - use ice on injury without applying directly to skin  C - compress injury with bandage or splint  E - elevate the injury as much as possible  Follow-up instructions: Please follow-up with your primary care provider or the provided orthopedic physician (bone specialist) if you continue to have significant pain or trouble walking in 1 week. In this case you may have a severe injury that requires further care.   Return instructions:   Please return if your toes are numb or tingling, appear gray or blue, or you have severe pain (also elevate leg and loosen splint or wrap if you were given one)  Please return to the Emergency Department if you experience worsening symptoms.   Please return  if you have any other emergent concerns.  Additional Information:  Your vital signs today were: BP 148/102   Pulse 98   Temp(Src) 97.8 F (36.6 C) (Oral)   Resp 20   SpO2 95% If your blood pressure (BP) was elevated above 135/85 this visit, please have this repeated by your doctor within one month. -------------- If prescribed crutches for your injury: use crutches with non-weight bearing for the first few days. Then, you may walk as the pain allows, or as instructed. Start gradually with weight bearing on the affected side. Once you can walk pain free, then try jogging. When you can run forwards, then you can try moving side-to-side. If you cannot walk without crutches in one week, you need a re-check. --------------

## 2016-11-18 ENCOUNTER — Emergency Department (HOSPITAL_COMMUNITY)
Admission: EM | Admit: 2016-11-18 | Discharge: 2016-11-18 | Disposition: A | Discharge status: Home / Self Care | Payer: Self-pay | Attending: Emergency Medicine | Admitting: Emergency Medicine

## 2016-11-18 ENCOUNTER — Encounter (HOSPITAL_COMMUNITY): Payer: Self-pay | Admitting: *Deleted

## 2016-11-18 DIAGNOSIS — I10 Essential (primary) hypertension: Secondary | ICD-10-CM | POA: Insufficient documentation

## 2016-11-18 DIAGNOSIS — Z7982 Long term (current) use of aspirin: Secondary | ICD-10-CM | POA: Insufficient documentation

## 2016-11-18 DIAGNOSIS — B309 Viral conjunctivitis, unspecified: Secondary | ICD-10-CM | POA: Insufficient documentation

## 2016-11-18 NOTE — ED Notes (Signed)
Patient is A&Ox4 at this time.  Patient in no signs of distress.  Please see providers note for complete history and physical exam.  

## 2016-11-18 NOTE — ED Triage Notes (Signed)
Pt reports a recent cold. States that he woke this morning with reddness and drainage to rt eye

## 2016-11-18 NOTE — ED Provider Notes (Signed)
[facility] DEPT Provider Note   CSN: [id number] Arrival date & time: 11/18/16  1810  By signing my name below, I, [name], attest that this documentation has been prepared under the direction and in the presence of Virgel Manifold, MD . Electronically Signed: [name], Scribe. 11/18/2016. 7:39 PM.  History   Chief Complaint Chief Complaint  Patient presents with  . Eye Drainage   The history is provided by the patient. No language interpreter was used.    HPI Comments:  Craig Collins is a 50 y.o. male who presents to the Emergency Department complaining of eye redness and irritation to the right eye since this AM. He describes a griyty/sandy sensation in the eye. Pt reports associated teraing from the right eye. No blurry vision. No alleviating factors noted.  No use of corrective lenses or contacts. Pt notes he recently had cough and congestion; symptoms have improved at this time.   Past Medical History:  Diagnosis Date  . Hypertension   . Obesity   . Sleep apnea     There are no active problems to display for this patient.   No past surgical history on file.     Home Medications    Prior to Admission medications   Medication Sig Start Date End Date Taking? Authorizing Provider  aspirin EC 81 MG tablet Take 81 mg by mouth daily.    Historical Provider, MD  cephALEXin (KEFLEX) 500 MG capsule Take 1 capsule (500 mg total) by mouth 4 (four) times daily. 01/05/14   Hannah Muthersbaugh, PA-C  cyclobenzaprine (FLEXERIL) 10 MG tablet Take 1 tablet (10 mg total) by mouth 2 (two) times daily as needed for muscle spasms. 01/05/14   Hannah Muthersbaugh, PA-C  hydrALAZINE (APRESOLINE) 10 MG tablet Take 10 mg by mouth daily.     Historical Provider, MD  HYDROcodone-acetaminophen (NORCO/VICODIN) 5-325 MG per tablet Take 1-2 tablets every 6 hours as needed for severe pain 10/15/14   Carlisle Cater, PA-C  ibuprofen (ADVIL,MOTRIN) 800 MG tablet Take 1 tablet (800 mg total)  by mouth 3 (three) times daily. 01/05/14   Hannah Muthersbaugh, PA-C  lisinopril (PRINIVIL,ZESTRIL) 20 MG tablet Take 20 mg by mouth daily.    Historical Provider, MD  meloxicam (MOBIC) 15 MG tablet Take 1 tablet (15 mg total) by mouth daily. Take 1 daily with food. 10/15/14   Carlisle Cater, PA-C  naproxen (NAPROSYN) 500 MG tablet Take 500 mg by mouth 2 (two) times daily. 09/20/13   Etta Quill, NP  oxyCODONE-acetaminophen (PERCOCET/ROXICET) 5-325 MG per tablet Take 2 tablets by mouth every 4 (four) hours as needed for severe pain. 01/05/14   Jarrett Soho Muthersbaugh, PA-C    Family History No family history on file.  Social History Social History  Substance Use Topics  . Smoking status: Never Smoker  . Smokeless tobacco: Not on file  . Alcohol use Yes     Comment: occ     Allergies   Patient has no known allergies.   Review of Systems Review of Systems  Constitutional: Negative for fever.  HENT: Positive for congestion.   Eyes: Positive for discharge, redness and itching. Negative for visual disturbance.  Respiratory: Positive for cough.      Physical Exam Updated Vital Signs BP (!) 169/117 (BP Location: Right Arm)   Pulse 91   Temp 98.1 F (36.7 C) (Oral)   Resp 16   SpO2 99%   Physical Exam  Constitutional: He is oriented to person, place, and time. He appears  well-developed and well-nourished. No distress.  HENT:  Head: Normocephalic.  Eyes: EOM are normal.  Right conjunctival injection; tearing  Periorbital tissue is normal   Neck: Normal range of motion.  Pulmonary/Chest: Effort normal.  Abdominal: He exhibits no distension.  Musculoskeletal: Normal range of motion.  Neurological: He is alert and oriented to person, place, and time.  Psychiatric: He has a normal mood and affect.  Nursing note and vitals reviewed.    ED Treatments / Results  DIAGNOSTIC STUDIES:  Oxygen Saturation is 99% on RA, normal by my interpretation.    COORDINATION OF CARE:  7:35  PM Discussed treatment plan with pt at bedside and pt agreed to plan.  Labs (all labs ordered are listed, but only abnormal results are displayed) Labs Reviewed - No data to display  EKG  EKG Interpretation None       Radiology No results found.  Procedures Procedures (including critical care time)  Medications Ordered in ED Medications - No data to display   Initial Impression / Assessment and Plan / ED Course  I have reviewed the triage vital signs and the nursing notes.  Pertinent labs & imaging results that were available during my care of the patient were reviewed by me and considered in my medical decision making (see chart for details).  Clinical Course     Final Clinical Impressions(s) / ED Diagnoses   Final diagnoses:  Viral conjunctivitis of right eye    New Prescriptions New Prescriptions   No medications on file   I personally preformed the services scribed in my presence. The recorded information has been reviewed is accurate. Virgel Manifold, MD.     Virgel Manifold, MD 11/27/16 [phone]

## 2016-11-18 NOTE — ED Notes (Signed)
Patient Alert and oriented X4. Stable and ambulatory. Patient verbalized understanding of the discharge instructions.  Patient belongings were taken by the patient.  

## 2017-10-12 ENCOUNTER — Encounter (HOSPITAL_COMMUNITY): Payer: Self-pay | Admitting: *Deleted

## 2017-10-12 ENCOUNTER — Inpatient Hospital Stay (HOSPITAL_COMMUNITY)
Admission: EM | Admit: 2017-10-12 | Discharge: 2017-10-15 | DRG: 378 | Disposition: A | Discharge status: Home / Self Care | Payer: Self-pay | Attending: Internal Medicine | Admitting: Internal Medicine

## 2017-10-12 DIAGNOSIS — K219 Gastro-esophageal reflux disease without esophagitis: Secondary | ICD-10-CM | POA: Diagnosis present

## 2017-10-12 DIAGNOSIS — G4733 Obstructive sleep apnea (adult) (pediatric): Secondary | ICD-10-CM | POA: Diagnosis present

## 2017-10-12 DIAGNOSIS — D62 Acute posthemorrhagic anemia: Secondary | ICD-10-CM | POA: Diagnosis present

## 2017-10-12 DIAGNOSIS — E669 Obesity, unspecified: Secondary | ICD-10-CM | POA: Diagnosis present

## 2017-10-12 DIAGNOSIS — N179 Acute kidney failure, unspecified: Secondary | ICD-10-CM | POA: Diagnosis present

## 2017-10-12 DIAGNOSIS — K5731 Diverticulosis of large intestine without perforation or abscess with bleeding: Principal | ICD-10-CM

## 2017-10-12 DIAGNOSIS — Z7982 Long term (current) use of aspirin: Secondary | ICD-10-CM

## 2017-10-12 DIAGNOSIS — R739 Hyperglycemia, unspecified: Secondary | ICD-10-CM | POA: Diagnosis present

## 2017-10-12 DIAGNOSIS — Z6836 Body mass index (BMI) 36.0-36.9, adult: Secondary | ICD-10-CM

## 2017-10-12 DIAGNOSIS — K921 Melena: Secondary | ICD-10-CM

## 2017-10-12 DIAGNOSIS — D128 Benign neoplasm of rectum: Secondary | ICD-10-CM

## 2017-10-12 DIAGNOSIS — E538 Deficiency of other specified B group vitamins: Secondary | ICD-10-CM | POA: Diagnosis present

## 2017-10-12 DIAGNOSIS — I10 Essential (primary) hypertension: Secondary | ICD-10-CM | POA: Diagnosis present

## 2017-10-12 DIAGNOSIS — Z8249 Family history of ischemic heart disease and other diseases of the circulatory system: Secondary | ICD-10-CM

## 2017-10-12 DIAGNOSIS — K625 Hemorrhage of anus and rectum: Secondary | ICD-10-CM | POA: Diagnosis present

## 2017-10-12 DIAGNOSIS — K922 Gastrointestinal hemorrhage, unspecified: Secondary | ICD-10-CM | POA: Diagnosis present

## 2017-10-12 HISTORY — DX: Obesity, unspecified: E66.9

## 2017-10-12 LAB — COMPREHENSIVE METABOLIC PANEL
ALBUMIN: 3.8 g/dL (ref 3.5–5.0)
ALT: 19 U/L (ref 17–63)
AST: 20 U/L (ref 15–41)
Alkaline Phosphatase: 64 U/L (ref 38–126)
Anion gap: 7 (ref 5–15)
BUN: 18 mg/dL (ref 6–20)
CHLORIDE: 107 mmol/L (ref 101–111)
CO2: 25 mmol/L (ref 22–32)
Calcium: 8.9 mg/dL (ref 8.9–10.3)
Creatinine, Ser: 1.48 mg/dL — ABNORMAL HIGH (ref 0.61–1.24)
GFR calc Af Amer: >60 mL/min (ref >60)
GFR calc non Af Amer: 53 mL/min — ABNORMAL LOW (ref >60)
GLUCOSE: 132 mg/dL — AB (ref 65–99)
POTASSIUM: 3.6 mmol/L (ref 3.5–5.1)
Sodium: 139 mmol/L (ref 135–145)
Total Bilirubin: 0.7 mg/dL (ref 0.3–1.2)
Total Protein: 6.7 g/dL (ref 6.5–8.1)

## 2017-10-12 LAB — POC OCCULT BLOOD, ED: Fecal Occult Bld: POSITIVE — AB

## 2017-10-12 LAB — CBC
HEMATOCRIT: 39.2 % (ref 39.0–52.0)
Hemoglobin: 12.7 g/dL — ABNORMAL LOW (ref 13.0–17.0)
MCH: 27.3 pg (ref 26.0–34.0)
MCHC: 32.4 g/dL (ref 30.0–36.0)
MCV: 84.3 fL (ref 78.0–100.0)
Platelets: 226 10*3/uL (ref 150–400)
RBC: 4.65 MIL/uL (ref 4.22–5.81)
RDW: 13.1 % (ref 11.5–15.5)
WBC: 5.2 10*3/uL (ref 4.0–10.5)

## 2017-10-12 NOTE — ED Notes (Signed)
Patient ambulatory to restroom and changing into gown.

## 2017-10-12 NOTE — ED Triage Notes (Signed)
Pt reports that his "stomach has been feeling uneasy today." had bowel movement with blood noted x 2 today.denies n/v.  No acute distress is noted at triage.

## 2017-10-12 NOTE — ED Provider Notes (Signed)
[facility] EMERGENCY DEPARTMENT Provider Note   CSN: [id number] Arrival date & time: 10/12/17  1636     History   Chief Complaint Chief Complaint  Patient presents with  . Rectal Bleeding    HPI Craig Collins is a 51 y.o. male.  The history is provided by the patient and medical records.  Rectal Bleeding     51 y.o. M with hx of HTN, obesity, sleep apnea, presenting to the ED for rectal bleeding.  States today he felt like he needed to have a BM so he went to the bathroom, but states it was nothing but gushes of blood that came out.  States it was lighter red in color, not dark and tarry.  States this happened an additional 2x.  No clots passed.  Denies abdominal pain, rectal pain, nausea, vomiting, or urinary symptoms.  States he takes daily ASA, no other anticoagulation.  Does not take any NSAIDs regularly.  States no prior hx of GI bleeding or other issues in the past.  Has never had a colonoscopy.  Past Medical History:  Diagnosis Date  . Hypertension   . Obesity   . Sleep apnea     There are no active problems to display for this patient.   History reviewed. No pertinent surgical history.     Home Medications    Prior to Admission medications   Medication Sig Start Date End Date Taking? Authorizing Provider  aspirin EC 81 MG tablet Take 81 mg by mouth daily.    [provider]  cephALEXin (KEFLEX) 500 MG capsule Take 1 capsule (500 mg total) by mouth 4 (four) times daily. 01/05/14   Muthersbaugh, Jarrett Soho, PA-C  cyclobenzaprine (FLEXERIL) 10 MG tablet Take 1 tablet (10 mg total) by mouth 2 (two) times daily as needed for muscle spasms. 01/05/14   Muthersbaugh, Jarrett Soho, PA-C  hydrALAZINE (APRESOLINE) 10 MG tablet Take 10 mg by mouth daily.     [provider]  HYDROcodone-acetaminophen (NORCO/VICODIN) 5-325 MG per tablet Take 1-2 tablets every 6 hours as needed for severe pain 10/15/14   Carlisle Cater, PA-C  ibuprofen  (ADVIL,MOTRIN) 800 MG tablet Take 1 tablet (800 mg total) by mouth 3 (three) times daily. 01/05/14   Muthersbaugh, Jarrett Soho, PA-C  lisinopril (PRINIVIL,ZESTRIL) 20 MG tablet Take 20 mg by mouth daily.    [provider]  meloxicam (MOBIC) 15 MG tablet Take 1 tablet (15 mg total) by mouth daily. Take 1 daily with food. 10/15/14   Carlisle Cater, PA-C  naproxen (NAPROSYN) 500 MG tablet Take 500 mg by mouth 2 (two) times daily. 09/20/13   Etta Quill, NP  oxyCODONE-acetaminophen (PERCOCET/ROXICET) 5-325 MG per tablet Take 2 tablets by mouth every 4 (four) hours as needed for severe pain. 01/05/14   Muthersbaugh, Jarrett Soho, PA-C    Family History History reviewed. No pertinent family history.  Social History Social History  Substance Use Topics  . Smoking status: Never Smoker  . Smokeless tobacco: Not on file  . Alcohol use Yes     Comment: occ     Allergies   Patient has no known allergies.   Review of Systems Review of Systems  Gastrointestinal: Positive for hematochezia.  All other systems reviewed and are negative.    Physical Exam Updated Vital Signs BP (!) 174/113 (BP Location: Right Arm)   Pulse (!) 105   Temp 98.4 F (36.9 C) (Oral)   Resp 18   SpO2 96%   Physical Exam  Constitutional:  He is oriented to person, place, and time. He appears well-developed and well-nourished.  HENT:  Head: Normocephalic and atraumatic.  Mouth/Throat: Oropharynx is clear and moist.  Eyes: Pupils are equal, round, and reactive to light. Conjunctivae and EOM are normal.  Neck: Normal range of motion.  Cardiovascular: Normal rate, regular rhythm and normal heart sounds.   Pulmonary/Chest: Effort normal and breath sounds normal.  Abdominal: Soft. Bowel sounds are normal. There is no tenderness. There is no rigidity and no guarding.  Genitourinary:  Genitourinary Comments: Exam chaperoned by NT Rectum normal without external hemorrhoids or fissures; gross bright red blood on DRE  intermixed with light brown stool  Musculoskeletal: Normal range of motion.  Neurological: He is alert and oriented to person, place, and time.  Skin: Skin is warm and dry.  Psychiatric: He has a normal mood and affect.  Nursing note and vitals reviewed.    ED Treatments / Results  Labs (all labs ordered are listed, but only abnormal results are displayed) Labs Reviewed  COMPREHENSIVE METABOLIC PANEL - Abnormal; Notable for the following:       Result Value   Glucose, Bld 132 (*)    Creatinine, Ser 1.48 (*)    GFR calc non Af Amer 53 (*)    All other components within normal limits  CBC - Abnormal; Notable for the following:    Hemoglobin 12.7 (*)    All other components within normal limits  POC OCCULT BLOOD, ED - Abnormal; Notable for the following:    Fecal Occult Bld POSITIVE (*)    All other components within normal limits  PROTIME-INR  APTT  TYPE AND SCREEN    EKG  EKG Interpretation None       Radiology No results found.  Procedures Procedures (including critical care time)  Medications Ordered in ED Medications - No data to display   Initial Impression / Assessment and Plan / ED Course  I have reviewed the triage vital signs and the nursing notes.  Pertinent labs & imaging results that were available during my care of the patient were reviewed by me and considered in my medical decision making (see chart for details).  51 year old male here with rectal bleeding onset today. No abdominal pain, vomiting, fever, or chills. Takes daily aspirin, no other anticoagulation. No history of GI bleeding or issues in the past. No prior colonoscopy.  On exam he is afebrile and nontoxic. Abdomen is obese but nontender Bowel sounds are normal. Rectal exam was performed here, gross bright red blood noted intermixed with light brown stool. Patient is hemodynamically stable. Labs reviewed, overall reassuring. Hemoglobin is 12.7.  Patient has no established PCP or GI  follow-up.  Will admit for further evaluation.  Coags and type and screen added.  Discussed with on call GI, Dr. Fuller Plan-- member of his team will see in the morning.  Will plan admission to medicine service.  Discussed with Dr. Hal Hope, he will admit for ongoing care.  Final Clinical Impressions(s) / ED Diagnoses   Final diagnoses:  Acute GI bleeding    New Prescriptions New Prescriptions   No medications on file     Larene Pickett, PA-C 10/13/17 0114    Merryl Hacker, MD 10/14/17 [phone]

## 2017-10-13 ENCOUNTER — Encounter (HOSPITAL_COMMUNITY): Payer: Self-pay | Admitting: Internal Medicine

## 2017-10-13 DIAGNOSIS — K625 Hemorrhage of anus and rectum: Secondary | ICD-10-CM | POA: Diagnosis present

## 2017-10-13 DIAGNOSIS — K922 Gastrointestinal hemorrhage, unspecified: Secondary | ICD-10-CM | POA: Diagnosis present

## 2017-10-13 DIAGNOSIS — N179 Acute kidney failure, unspecified: Secondary | ICD-10-CM

## 2017-10-13 DIAGNOSIS — D62 Acute posthemorrhagic anemia: Secondary | ICD-10-CM | POA: Diagnosis present

## 2017-10-13 DIAGNOSIS — K921 Melena: Secondary | ICD-10-CM

## 2017-10-13 LAB — BASIC METABOLIC PANEL
ANION GAP: 6 (ref 5–15)
BUN: 17 mg/dL (ref 6–20)
CALCIUM: 8.5 mg/dL — AB (ref 8.9–10.3)
CO2: 24 mmol/L (ref 22–32)
CREATININE: 1.37 mg/dL — AB (ref 0.61–1.24)
Chloride: 109 mmol/L (ref 101–111)
GFR, EST NON AFRICAN AMERICAN: 58 mL/min — AB (ref >60)
GLUCOSE: 118 mg/dL — AB (ref 65–99)
Potassium: 4 mmol/L (ref 3.5–5.1)
Sodium: 139 mmol/L (ref 135–145)

## 2017-10-13 LAB — IRON AND TIBC
IRON: 104 ug/dL (ref 45–182)
Saturation Ratios: 36 % (ref 17.9–39.5)
TIBC: 290 ug/dL (ref 250–450)
UIBC: 186 ug/dL

## 2017-10-13 LAB — CBC
HCT: 34 % — ABNORMAL LOW (ref 39.0–52.0)
HEMATOCRIT: 31.8 % — AB (ref 39.0–52.0)
HEMOGLOBIN: 10.3 g/dL — AB (ref 13.0–17.0)
Hemoglobin: 11 g/dL — ABNORMAL LOW (ref 13.0–17.0)
MCH: 27.5 pg (ref 26.0–34.0)
MCH: 27.5 pg (ref 26.0–34.0)
MCHC: 32.4 g/dL (ref 30.0–36.0)
MCHC: 32.4 g/dL (ref 30.0–36.0)
MCV: 85 fL (ref 78.0–100.0)
MCV: 85 fL (ref 78.0–100.0)
PLATELETS: 196 10*3/uL (ref 150–400)
Platelets: 175 10*3/uL (ref 150–400)
RBC: 4 MIL/uL — AB (ref 4.22–5.81)
RBC: 3.74 MIL/uL — AB (ref 4.22–5.81)
RDW: 12.9 % (ref 11.5–15.5)
RDW: 13 % (ref 11.5–15.5)
WBC: 5.7 10*3/uL (ref 4.0–10.5)
WBC: 6.5 10*3/uL (ref 4.0–10.5)

## 2017-10-13 LAB — RETICULOCYTES
RBC.: 3.74 MIL/uL — ABNORMAL LOW (ref 4.22–5.81)
RETIC COUNT ABSOLUTE: 44.9 10*3/uL (ref 19.0–186.0)
Retic Ct Pct: 1.2 % (ref 0.4–3.1)

## 2017-10-13 LAB — MAGNESIUM: MAGNESIUM: 1.9 mg/dL (ref 1.7–2.4)

## 2017-10-13 LAB — TYPE AND SCREEN
ABO/RH(D): O POS
Antibody Screen: NEGATIVE

## 2017-10-13 LAB — HIV ANTIBODY (ROUTINE TESTING W REFLEX): HIV SCREEN 4TH GENERATION: NONREACTIVE

## 2017-10-13 LAB — FERRITIN: Ferritin: 158 ng/mL (ref 24–336)

## 2017-10-13 LAB — GLUCOSE, CAPILLARY
GLUCOSE-CAPILLARY: 106 mg/dL — AB (ref 65–99)
GLUCOSE-CAPILLARY: 95 mg/dL (ref 65–99)

## 2017-10-13 LAB — ABO/RH: ABO/RH(D): O POS

## 2017-10-13 LAB — PROTIME-INR
INR: 1.09
PROTHROMBIN TIME: 14 s (ref 11.4–15.2)

## 2017-10-13 LAB — APTT: aPTT: 33 seconds (ref 24–36)

## 2017-10-13 MED ORDER — HYDRALAZINE HCL 20 MG/ML IJ SOLN: 5.0000 mg | INTRAMUSCULAR | Status: DC | PRN

## 2017-10-13 MED ORDER — PEG-KCL-NACL-NASULF-NA ASC-C 100 G PO SOLR
1.0000 | Freq: Once | ORAL | Status: AC
  Administered 2017-10-14: 200 g via ORAL

## 2017-10-13 MED ORDER — METOCLOPRAMIDE HCL 5 MG/ML IJ SOLN
10.0000 mg | Freq: Once | INTRAMUSCULAR | Status: AC
  Administered 2017-10-13: 10 mg via INTRAVENOUS
  Filled 2017-10-13: qty 2

## 2017-10-13 MED ORDER — ACETAMINOPHEN 325 MG PO TABS: 650.0000 mg | ORAL_TABLET | Freq: Four times a day (QID) | ORAL | Status: DC | PRN

## 2017-10-13 MED ORDER — PEG-KCL-NACL-NASULF-NA ASC-C 100 G PO SOLR: 1.0000 | Freq: Once | ORAL | Status: DC

## 2017-10-13 MED ORDER — PEG-KCL-NACL-NASULF-NA ASC-C 100 G PO SOLR
1.0000 | Freq: Once | ORAL | Status: AC
  Administered 2017-10-13: 200 g via ORAL
  Filled 2017-10-13: qty 1

## 2017-10-13 MED ORDER — METOCLOPRAMIDE HCL 5 MG/ML IJ SOLN
10.0000 mg | Freq: Once | INTRAMUSCULAR | Status: AC
  Administered 2017-10-14: 10 mg via INTRAVENOUS
  Filled 2017-10-13: qty 2

## 2017-10-13 MED ORDER — ACETAMINOPHEN 650 MG RE SUPP: 650.0000 mg | Freq: Four times a day (QID) | RECTAL | Status: DC | PRN

## 2017-10-13 MED ORDER — ONDANSETRON HCL 4 MG/2ML IJ SOLN: 4.0000 mg | Freq: Four times a day (QID) | INTRAMUSCULAR | Status: DC | PRN

## 2017-10-13 MED ORDER — SODIUM CHLORIDE 0.9 % IV SOLN
INTRAVENOUS | Status: DC
  Administered 2017-10-13: 04:00:00 via INTRAVENOUS

## 2017-10-13 MED ORDER — ONDANSETRON HCL 4 MG PO TABS: 4.0000 mg | ORAL_TABLET | Freq: Four times a day (QID) | ORAL | Status: DC | PRN

## 2017-10-13 MED ORDER — SODIUM CHLORIDE 0.9 % IV SOLN: INTRAVENOUS | Status: DC

## 2017-10-13 MED ORDER — BISACODYL 5 MG PO TBEC
5.0000 mg | DELAYED_RELEASE_TABLET | Freq: Once | ORAL | Status: AC
  Administered 2017-10-13: 5 mg via ORAL
  Filled 2017-10-13: qty 1

## 2017-10-13 NOTE — Progress Notes (Signed)
Pt experiencing frequent PVC's, one episode of trigeminal PVC's. BP 144/115, P 100. Patient asymptomatic. MD notified, new orders received.

## 2017-10-13 NOTE — Consult Note (Signed)
[facility] Consult: 9:20 AM 10/13/2017  LOS: 0 days    Referring Provider: Dr Wynelle Cleveland  Primary Care Physician:  Patient, No Pcp Per.  Previously Craig Collins.   Primary Gastroenterologist:  unassigned   Reason for Consultation:  Bleeding from rectum.     HPI: Craig Collins is a 51 y.o. male.  Hx OSA.  Htn.  Obesity.  Takes 81 mg ASA and vitamin, Herbal Life supplements but no Rx meds.   Had 4 to 5 episodes of hematochezia, no abd pain or GI upset, scattered throughout the day yesterday.  Started at 8 AM.  No systemic sxs: able to walk 3 miles in park in the AM, go to work as Cabin crew.  Never dizzy, no CP or palpitations.   For about 6 months having intermittent scant BPR and occasional smears of brown stool after a normal formed "turd".  Ongoing weight loss efforts: 70 # 2 years ago when he went off BP meds and his CPAP.  In last 4 weeks, dropped 7 #, his goal is to achieve 30# wt loss.  Last BM this AM was some dark blood and some brown stool.     Hypertensive to 174/113.  Tachy to 111.   Hgb 11.  MCV 85.  BUN, coags, platelets normal.  Minor increase creatinine with normal GFR.  FOBT +.    No previous EGD or colonoscopy.  GERD sxs abated with 70# wt loss.  No anorexia.   FM hx + for heart dz: brothers died at 64 and 34, Dad at age 31.  Mom died in her 64s with ? Leukemia.  No known colorectal disease or cancers.   No NSAIDs, no tobacco.  No ETOH for many weeks and generally moderate if/when he drinks.  No hx liver disease.    Past Medical History:  Diagnosis Date  . Hypertension   . Obesity (BMI 35.0-39.9 without comorbidity)   . Sleep apnea     History reviewed. No pertinent surgical history.  Prior to Admission medications   Medication Sig Start Date End Date Taking? Authorizing  Provider  aspirin EC 81 MG tablet Take 81 mg by mouth daily.   Yes [provider]    Scheduled Meds:  Infusions:  PRN Meds: acetaminophen **OR** acetaminophen, hydrALAZINE, ondansetron **OR** ondansetron (ZOFRAN) IV   Allergies as of 10/12/2017  . (No Known Allergies)    Family History  Problem Relation Age of Onset  . Hypertension Other   . Hypertension Brother   . Colon cancer Neg Hx     Social History   Social History  . Marital status: Married    Spouse name: N/A  . Number of children: N/A  . Years of education: N/A   Occupational History  . Not on file.   Social History Main Topics  . Smoking status: Never Smoker  . Smokeless tobacco: Never Used  . Alcohol use Yes     Comment: occ  . Drug use: No  . Sexual activity: Not on file   Other Topics Concern  .  Not on file   Social History Narrative  . No narrative on file    REVIEW OF SYSTEMS: Constitutional:  No weakness. No malaise. ENT:  No nose bleeds Pulm:  No trouble breathing. Able to walk a ong flight of stairs without difficulty. Also exercises regularly without problems.  His wife has noticed that he doesn't have any snoring or apneic spells that he used to have before he lost weight. CV:  No palpitations, no LE edema. No chest pain. GU:  No hematuria, no frequency GI:  Per HPI.  No heartburn or dysphagia. Heme:  No unusual patterns of excessive bleeding or bruising.   Transfusions:  Has never received transfusion of blood products. Neuro:  No headaches, no peripheral tingling or numbness Derm:  No itching, no rash or sores.  Endocrine:  No sweats or chills.  No polyuria or dysuria Immunization:  Did not inquire as to recent or past immunizations. Travel:  None beyond local counties in last few months.    PHYSICAL EXAM: Vital signs in last 24 hours: Vitals:   10/13/17 0408 10/13/17 0529  BP: (!) 144/115 (!) 137/91  Pulse: 100 99  Resp:  17  Temp: 98 F (36.7 C) 97.8 F (36.6  C)  SpO2: 98% 95%   Wt Readings from Last 3 Encounters:  10/13/17 119.5 kg (263 lb 7.2 oz)    General: obese, alert, comfortable. Does not look ill. Head:  No facial asymmetry or swelling. No signs of head trauma.  Eyes:  No conjunctival pallor.EOMI. Ears:  Not hard of hearing  Nose:  No discharge or congestion Mouth:  Good dentition. Tongue midline. Oral mucosa pink, moist, clear. Neck:  No JVD, no thyromegaly. No masses or bruits. Lungs:  nonlabored breathing. No cough. Lungs clear bilaterally. Heart: RRR. No MRG. S1, S2 present. Abdomen:  Obese, soft. Nontender. No organomegaly. No masses, no bruits no hernias..   Rectal: in the ED he had no rectal masses, no hemorrhoids, no fissures. There was bright red blood intermixed with light brown stool on exam glove. Rectal exam was not repeated   Musc/Skeltl: no joint swelling or gross deformities. Extremities:  No CCE.  Neurologic:  Alert. Oriented times 3. Excellent historian. Moves all 4 limbs, strength not tested. No tremors no gross deficits. Skin:  No rashes, no sores, no purpura, no bruising. Tattoos:  None observed Nodes:  o cervical or inguinal adenopathy.   Psych:  Pleasant, cooperative. Fluid speech. Not anxious or depressed  LAB RESULTS:  Recent Labs  10/12/17 1713 10/13/17 0435  WBC 5.2 5.7  HGB 12.7* 11.0*  HCT 39.2 34.0*  PLT 226 196   BMET Lab Results  Component Value Date   NA 139 10/13/2017   NA 139 10/12/2017   K 4.0 10/13/2017   K 3.6 10/12/2017   CL 109 10/13/2017   CL 107 10/12/2017   CO2 24 10/13/2017   CO2 25 10/12/2017   GLUCOSE 118 (H) 10/13/2017   GLUCOSE 132 (H) 10/12/2017   BUN 17 10/13/2017   BUN 18 10/12/2017   CREATININE 1.37 (H) 10/13/2017   CREATININE 1.48 (H) 10/12/2017   CALCIUM 8.5 (L) 10/13/2017   CALCIUM 8.9 10/12/2017   LFT  Recent Labs  10/12/17 1713  PROT 6.7  ALBUMIN 3.8  AST 20  ALT 19  ALKPHOS 64  BILITOT 0.7   PT/INR Lab Results  Component Value Date    INR 1.09 10/13/2017    IMPRESSION:   *  Painless hematochezia.  Rule  out diverticulosis, rule out colon mass, rule out hemorrhoidal bleeding  *  Hypertension.  No meds at home.  His blood pressure had improved when he lost 70 pounds but looks like he may need to restart antihypertensives.   *  Obesity.  Through moderate exercise and diet, patient lost 70 pounds 2 years ago and has lost 7 pounds in the last month.  BMI remains in obese range at 36.  He is to be commended for his significant weight reduction and lifestyle changes.    *  OSA. Patient stopped using CPAP when he lost weight.    PLAN:     *  Colonoscopy set for 1 PM tomorrow.  Risks and benefits of the procedure discussed with patient along with the prep protocol. Patient is agreeable to proceed.  *  Clear liquid diet today, split dose moviprep begins tonight.   Azucena Freed  10/13/2017, 9:20 AM Pager: [phone]    [facility] Attending   I have taken an interval history, reviewed the chart and examined the patient. I agree with the Advanced Practitioner's note, impression and recommendations.    Gatha Mayer, MD, [facility] [phone] (pager) 10/13/2017 5:57 PM

## 2017-10-13 NOTE — Progress Notes (Signed)
Pt arrived to unit, ambulated to bed. Identified appropriately and assessed pt. Alert and oriented x 4. Temperature 98.0, BP 144/110, P 111, R 17. Skin intact. Oriented to room. Resting, denies any pain. Will ctm.

## 2017-10-13 NOTE — H&P (Signed)
History and Physical    Craig Collins GGY:[id number] DOB: 30-Aug-1966 DOA: 10/12/2017  PCP: Patient, No Pcp Per  Patient coming from: Home.  Chief Complaint: Rectal bleeding.  HPI: Craig Collins is a 51 y.o. male with history of hypertension and sleep apnea who has not been taking any medications and has not been to a physician for many years presents to the ER after patient had an episode of rectal bleeding.  Patient states yesterday morning patient had an episode of frank rectal bleeding.  Denies any abdominal pain nausea vomiting.  Denies any dizziness or weakness.  Denies taking any NSAIDs but does take baby aspirin.  ED Course: In the ER just before I was about examined patient had another episode of rectal bleeding.  Patient was mildly tachycardic but blood pressure was elevated.  Abdomen appears benign and hemoglobin was around 12.  Patient is being admitted for rectal bleeding likely lower GI.  Review of Systems: As per HPI, rest all negative.   Past Medical History:  Diagnosis Date  . Hypertension   . Obesity   . Sleep apnea     History reviewed. No pertinent surgical history.   reports that he has never smoked. He has never used smokeless tobacco. He reports that he drinks alcohol. He reports that he does not use drugs.  No Known Allergies  Family History  Problem Relation Age of Onset  . Hypertension Other     Prior to Admission medications   Medication Sig Start Date End Date Taking? Authorizing Provider  aspirin EC 81 MG tablet Take 81 mg by mouth daily.   Yes [provider]  cephALEXin (KEFLEX) 500 MG capsule Take 1 capsule (500 mg total) by mouth 4 (four) times daily. Patient not taking: Reported on 10/13/2017 01/05/14   Muthersbaugh, Jarrett Soho, PA-C  cyclobenzaprine (FLEXERIL) 10 MG tablet Take 1 tablet (10 mg total) by mouth 2 (two) times daily as needed for muscle spasms. Patient not taking: Reported on 10/13/2017 01/05/14   Muthersbaugh, Jarrett Soho,  PA-C  HYDROcodone-acetaminophen (NORCO/VICODIN) 5-325 MG per tablet Take 1-2 tablets every 6 hours as needed for severe pain Patient not taking: Reported on 10/13/2017 10/15/14   Carlisle Cater, PA-C  ibuprofen (ADVIL,MOTRIN) 800 MG tablet Take 1 tablet (800 mg total) by mouth 3 (three) times daily. Patient not taking: Reported on 10/13/2017 01/05/14   Muthersbaugh, Jarrett Soho, PA-C  meloxicam (MOBIC) 15 MG tablet Take 1 tablet (15 mg total) by mouth daily. Take 1 daily with food. Patient not taking: Reported on 10/13/2017 10/15/14   Carlisle Cater, PA-C  oxyCODONE-acetaminophen (PERCOCET/ROXICET) 5-325 MG per tablet Take 2 tablets by mouth every 4 (four) hours as needed for severe pain. Patient not taking: Reported on 10/13/2017 01/05/14   Abigail Butts, PA-C    Physical Exam: Vitals:   10/12/17 1646 10/12/17 1813  BP: (!) 139/105 (!) 174/113  Pulse: (!) 105 (!) 105  Resp: 18 18  Temp: 98.4 F (36.9 C)   TempSrc: Oral   SpO2: 98% 96%      Constitutional: Moderately built and nourished. Vitals:   10/12/17 1646 10/12/17 1813  BP: (!) 139/105 (!) 174/113  Pulse: (!) 105 (!) 105  Resp: 18 18  Temp: 98.4 F (36.9 C)   TempSrc: Oral   SpO2: 98% 96%   Eyes: Anicteric no pallor. ENMT: No discharge from the ears eyes nose or mouth. Neck: No mass felt.  No neck rigidity. Respiratory: No rhonchi or crepitations. Cardiovascular: S1-S2 heard no murmurs  appreciated. Abdomen: Soft nontender bowel sounds present. Musculoskeletal: No edema.  No joint effusion. Skin: No rash.  The skin appears warm. Neurologic: Alert awake oriented to time place and person.  Moves all extremities. Psychiatric: Appears normal.  Normal affect.   Labs on Admission: I have personally reviewed following labs and imaging studies  CBC:  Recent Labs Lab 10/12/17 1713  WBC 5.2  HGB 12.7*  HCT 39.2  MCV 84.3  PLT 696   Basic Metabolic Panel:  Recent Labs Lab 10/12/17 1713  NA 139  K 3.6  CL  107  CO2 25  GLUCOSE 132*  BUN 18  CREATININE 1.48*  CALCIUM 8.9   GFR: CrCl cannot be calculated (Unknown ideal weight.). Liver Function Tests:  Recent Labs Lab 10/12/17 1713  AST 20  ALT 19  ALKPHOS 64  BILITOT 0.7  PROT 6.7  ALBUMIN 3.8   No results for input(s): LIPASE, AMYLASE in the last 168 hours. No results for input(s): AMMONIA in the last 168 hours. Coagulation Profile:  Recent Labs Lab 10/13/17 0009  INR 1.09   Cardiac Enzymes: No results for input(s): CKTOTAL, CKMB, CKMBINDEX, TROPONINI in the last 168 hours. BNP (last 3 results) No results for input(s): PROBNP in the last 8760 hours. HbA1C: No results for input(s): HGBA1C in the last 72 hours. CBG: No results for input(s): GLUCAP in the last 168 hours. Lipid Profile: No results for input(s): CHOL, HDL, LDLCALC, TRIG, CHOLHDL, LDLDIRECT in the last 72 hours. Thyroid Function Tests: No results for input(s): TSH, T4TOTAL, FREET4, T3FREE, THYROIDAB in the last 72 hours. Anemia Panel: No results for input(s): VITAMINB12, FOLATE, FERRITIN, TIBC, IRON, RETICCTPCT in the last 72 hours. Urine analysis: No results found for: COLORURINE, APPEARANCEUR, LABSPEC, PHURINE, GLUCOSEU, HGBUR, BILIRUBINUR, KETONESUR, PROTEINUR, UROBILINOGEN, NITRITE, LEUKOCYTESUR Sepsis Labs: @LABRCNTIP (procalcitonin:4,lacticidven:4) )No results found for this or any previous visit (from the past 240 hour(s)).   Radiological Exams on Admission: No results found.  EKG: Independently reviewed.  Sinus tachycardia with PVCs.  Assessment/Plan Principal Problem:   Rectal bleeding Active Problems:   Acute blood loss anemia   Acute renal failure (ARF) (HCC)   Acute GI bleeding    1. Rectal bleeding -likely lower GI.  Will hold off aspirin.  Follow CBC.  Transfuse if hemoglobin less than 7 or patient becomes hypotensive.  Gently hydrate.  Consult gastroenterologist in the morning.  ER physician had discussed with Dr. Fuller Plan,  gastroenterologist. 2. History of hypertension has not been on medications the past few years -we will keep patient on as needed IV hydralazine for now and closely follow blood pressure trends. 3. Acute blood loss anemia -follow CBC. 4. Renal failure likely acute -gently hydrate and recheck metabolic panel. 5. History of sleep apnea -patient states he has not required CPAP after he lost weight.   DVT prophylaxis: SCDs. Code Status: Full code. Family Communication: Discussed with patient. Disposition Plan: Home. Consults called: The ER physician discussed with gastroenterologist Dr. Fuller Plan. Admission status: Observation.   Rise Patience MD [facility] Pager [phone].  If 7PM-7AM, please contact night-coverage [email or web address] Password [email or web address]  10/13/2017, 12:55 AM

## 2017-10-13 NOTE — ED Notes (Signed)
Patient is stable and ready to be transport to the floor at this time.  Report was called to 5W RN.  Belongings taken with the patient to the floor.   

## 2017-10-13 NOTE — Progress Notes (Signed)
[facility]  HPI: 51 y/o with history of hypertension and sleep apnea who has not been taking any medications and has not been to a physician for at least 1 yr presents to the ER after patient had an episode of rectal bleeding.  He has a few episodes of frank rectal bleeding.  Denies any abdominal pain nausea vomiting.  Denies any dizziness or weakness.  Denies taking any NSAIDs but does take baby aspirin daily (prophylactically).  FOB +  Principal Problem:   Rectal bleeding - GI plans for colonoscopy tomorrow afternoon- he has never had one in the past - last stool this AM had a small amount of blood mixed with stool  Active Problems:   Acute blood loss anemia - Hb 12.7 >> 11 - check Q 12 hrs and transfused if < 7 - check Anemia panel in AM  HTN - states he was taken off of medications 2 yrs ago- does not have a PCP currently - follow BP- on hydralazine PRN for now    Acute renal failure (ARF) vs CKD 2 - follow  Obesity - has lost about 40 lb via using Herbal life supplements/ meal replacement drinks  Mild hyperglycemia - check A1c  No PCP - case manager aware and attempting to find appt with [facility]  Debbe Odea, MD Pager: [email or web address]

## 2017-10-13 NOTE — Care Management Note (Signed)
Case Management Note  Patient Details  Name: Craig Collins MRN: [id number] Date of Birth: 03/27/1966  Subjective/Objective:          Presents with rectal bleeding, hx of sleep apnea and HTN, obesity. Resides with wife, Darryll Capers. Independent with ADL's, no DME usage. Pt without PCP/insurance.  Beauregard Jarrells (Spouse)     [phone]       PCP: n/a  Action/Plan: Colonoscopy scheduled for tomorrow @ 1pm..... CM to f/u with disposition needs.  Post hospital follow up scheduled for 10/28/2017 at 9:30 am with Molli Barrows NP @ [facility], noted on AVS. CM to share information with pt. Expected Discharge Date:    10/14/2017           Expected Discharge Plan:  Home/Self Care  In-House Referral:     Discharge planning Services  CM Consult  Post Acute Care Choice:    Choice offered to:     DME Arranged:    DME Agency:     HH Arranged:    HH Agency:     Status of Service:  In process, will continue to follow  If discussed at Long Length of Stay Meetings, dates discussed:    Additional Comments:  Sharin Mons, RN 10/13/2017, 10:27 AM

## 2017-10-14 ENCOUNTER — Encounter (HOSPITAL_COMMUNITY)
Admission: EM | Disposition: A | Discharge status: Home / Self Care | Payer: Self-pay | Source: Home / Self Care | Attending: Internal Medicine

## 2017-10-14 ENCOUNTER — Encounter (HOSPITAL_COMMUNITY): Payer: Self-pay

## 2017-10-14 DIAGNOSIS — K5731 Diverticulosis of large intestine without perforation or abscess with bleeding: Secondary | ICD-10-CM

## 2017-10-14 DIAGNOSIS — K621 Rectal polyp: Secondary | ICD-10-CM

## 2017-10-14 DIAGNOSIS — D128 Benign neoplasm of rectum: Secondary | ICD-10-CM

## 2017-10-14 DIAGNOSIS — K625 Hemorrhage of anus and rectum: Secondary | ICD-10-CM

## 2017-10-14 HISTORY — PX: COLONOSCOPY: SHX5424

## 2017-10-14 LAB — CBC
HCT: 30 % — ABNORMAL LOW (ref 39.0–52.0)
HEMATOCRIT: 29.8 % — AB (ref 39.0–52.0)
Hemoglobin: 9.7 g/dL — ABNORMAL LOW (ref 13.0–17.0)
Hemoglobin: 9.9 g/dL — ABNORMAL LOW (ref 13.0–17.0)
MCH: 27.6 pg (ref 26.0–34.0)
MCH: 28 pg (ref 26.0–34.0)
MCHC: 32.6 g/dL (ref 30.0–36.0)
MCHC: 33 g/dL (ref 30.0–36.0)
MCV: 84.7 fL (ref 78.0–100.0)
MCV: 85 fL (ref 78.0–100.0)
Platelets: 184 10*3/uL (ref 150–400)
Platelets: 178 10*3/uL (ref 150–400)
RBC: 3.52 MIL/uL — ABNORMAL LOW (ref 4.22–5.81)
RBC: 3.53 MIL/uL — AB (ref 4.22–5.81)
RDW: 13.1 % (ref 11.5–15.5)
RDW: 12.9 % (ref 11.5–15.5)
WBC: 5.3 10*3/uL (ref 4.0–10.5)
WBC: 5.5 10*3/uL (ref 4.0–10.5)

## 2017-10-14 LAB — BASIC METABOLIC PANEL
Anion gap: 6 (ref 5–15)
BUN: 10 mg/dL (ref 6–20)
CALCIUM: 8.3 mg/dL — AB (ref 8.9–10.3)
CHLORIDE: 109 mmol/L (ref 101–111)
CO2: 23 mmol/L (ref 22–32)
Creatinine, Ser: 1.34 mg/dL — ABNORMAL HIGH (ref 0.61–1.24)
GFR calc non Af Amer: 60 mL/min — ABNORMAL LOW (ref >60)
Glucose, Bld: 108 mg/dL — ABNORMAL HIGH (ref 65–99)
Potassium: 3.6 mmol/L (ref 3.5–5.1)
Sodium: 138 mmol/L (ref 135–145)

## 2017-10-14 LAB — GLUCOSE, CAPILLARY
GLUCOSE-CAPILLARY: 104 mg/dL — AB (ref 65–99)
Glucose-Capillary: 109 mg/dL — ABNORMAL HIGH (ref 65–99)

## 2017-10-14 LAB — VITAMIN B12: Vitamin B-12: 234 pg/mL (ref 180–914)

## 2017-10-14 LAB — FOLATE: FOLATE: 13.2 ng/mL (ref >5.9)

## 2017-10-14 LAB — HEMOGLOBIN A1C
HEMOGLOBIN A1C: 5.7 % — AB (ref 4.8–5.6)
Mean Plasma Glucose: 116.89 mg/dL

## 2017-10-14 SURGERY — COLONOSCOPY
Anesthesia: Moderate Sedation

## 2017-10-14 MED ORDER — DIPHENHYDRAMINE HCL 50 MG/ML IJ SOLN
INTRAMUSCULAR | Status: AC
  Filled 2017-10-14: qty 1

## 2017-10-14 MED ORDER — VITAMIN B-12 1000 MCG PO TABS
1000.0000 ug | ORAL_TABLET | Freq: Every day | ORAL | Status: DC
  Administered 2017-10-15: 1000 ug via ORAL
  Filled 2017-10-14: qty 1

## 2017-10-14 MED ORDER — FENTANYL CITRATE (PF) 100 MCG/2ML IJ SOLN
INTRAMUSCULAR | Status: AC
  Filled 2017-10-14: qty 2

## 2017-10-14 MED ORDER — MIDAZOLAM HCL 5 MG/5ML IJ SOLN
INTRAMUSCULAR | Status: DC | PRN
  Administered 2017-10-14 (×2): 2 mg via INTRAVENOUS
  Administered 2017-10-14: 1 mg via INTRAVENOUS
  Administered 2017-10-14: 2 mg via INTRAVENOUS

## 2017-10-14 MED ORDER — SODIUM CHLORIDE 0.9 % IV SOLN
INTRAVENOUS | Status: DC
  Administered 2017-10-14: 14:00:00 via INTRAVENOUS

## 2017-10-14 MED ORDER — FENTANYL CITRATE (PF) 100 MCG/2ML IJ SOLN
INTRAMUSCULAR | Status: DC | PRN
  Administered 2017-10-14 (×3): 25 ug via INTRAVENOUS

## 2017-10-14 MED ORDER — CYANOCOBALAMIN 1000 MCG/ML IJ SOLN
1000.0000 ug | Freq: Once | INTRAMUSCULAR | Status: AC
  Administered 2017-10-14: 1000 ug via SUBCUTANEOUS
  Filled 2017-10-14: qty 1

## 2017-10-14 MED ORDER — MIDAZOLAM HCL 5 MG/ML IJ SOLN
INTRAMUSCULAR | Status: AC
  Filled 2017-10-14: qty 3

## 2017-10-14 NOTE — Interval H&P Note (Signed)
History and Physical Interval Note:  10/14/2017 2:56 PM  Craig Collins  has presented today for surgery, with the diagnosis of hematochezia  The various methods of treatment have been discussed with the patient and family. After consideration of risks, benefits and other options for treatment, the patient has consented to  Procedure(s): COLONOSCOPY (N/A) as a surgical intervention .  The patient's history has been reviewed, patient examined, no change in status, stable for surgery.  I have reviewed the patient's chart and labs.  Questions were answered to the patient's satisfaction.     Silvano Rusk

## 2017-10-14 NOTE — Op Note (Addendum)
[facility] Patient Name: Craig Collins Procedure Date : 10/14/2017 MRN: [id number] Attending MD: Gatha Mayer , MD Date of Birth: 20-Mar-1966 CSN: [id number] Age: 51 Admit Type: Inpatient Procedure:                Colonoscopy Indications:              Hematochezia Providers:                Gatha Mayer, MD, Zenon Mayo, RN, Alan Mulder, Technician Referring MD:              Medicines:                Fentanyl 75 micrograms IV, Midazolam 7 mg IV Complications:            No immediate complications. Estimated Blood Loss:     Estimated blood loss was minimal. Procedure:                Pre-Anesthesia Assessment:                           - Prior to the procedure, a History and Physical                            was performed, and patient medications and                            allergies were reviewed. The patient's tolerance of                            previous anesthesia was also reviewed. The risks                            and benefits of the procedure and the sedation                            options and risks were discussed with the patient.                            All questions were answered, and informed consent                            was obtained. Prior Anticoagulants: The patient has                            taken no previous anticoagulant or antiplatelet                            agents. ASA Grade Assessment: III - A patient with                            severe systemic disease. After reviewing the risks  and benefits, the patient was deemed in                            satisfactory condition to undergo the procedure.                           After obtaining informed consent, the colonoscope                            was passed under direct vision. Throughout the                            procedure, the patient's blood pressure, pulse, and                            oxygen  saturations were monitored continuously. The                            EC-[id number] ([id number]) scope was introduced through                            the anus and advanced to the the cecum, identified                            by appendiceal orifice and ileocecal valve. The                            colonoscopy was performed without difficulty. The                            patient tolerated the procedure well. The quality                            of the bowel preparation was good. The appendiceal                            orifice and the rectum were photographed. Scope In: 3:20:50 PM Scope Out: 3:35:26 PM Scope Withdrawal Time: 0 hours 12 minutes 6 seconds  Total Procedure Duration: 0 hours 14 minutes 36 seconds  Findings:      The perianal and digital rectal examinations were normal.      A diminutive polyp was found in the rectum. The polyp was sessile. The       polyp was removed with a cold snare. Resection and retrieval were       complete. Verification of patient identification for the specimen was       done. Estimated blood loss was minimal.      Multiple small and large-mouthed diverticula were found in the sigmoid       colon. A few in transverse colon.      The exam was otherwise without abnormality on direct and retroflexion       views. Impression:               - One diminutive polyp in the rectum, removed with  a cold snare. Resected and retrieved.                           - Diverticulosis in the sigmoid colon and                            transverse colon. I suspect this was cause of                            bleeding.                           - The examination was otherwise normal on direct                            and retroflexion views. Moderate Sedation:      Moderate (conscious) sedation was administered by the endoscopy nurse       and supervised by the endoscopist. The following parameters were       monitored: oxygen  saturation, heart rate, blood pressure, respiratory       rate, EKG, adequacy of pulmonary ventilation, and response to care.       Total physician intraservice time was 15 minutes. Recommendation:           - Patient has a contact number available for                            emergencies. The signs and symptoms of potential                            delayed complications were discussed with the                            patient. Return to normal activities tomorrow.                            Written discharge instructions were provided to the                            patient.                           - Resume previous diet.                           - Continue present medications.                           - Repeat colonoscopy is recommended. The                            colonoscopy date will be determined after pathology                            results from today's exam become available for  review.                           - Return patient to hospital ward for ongoing care.                           - I will f/u pathology and notify patient.                           Follow-up GI prn                           Home tomorrow if no further bleeding - call if ? Procedure Code(s):        --- Professional ---                           [phone], Colonoscopy, flexible; with removal of                            tumor(s), polyp(s), or other lesion(s) by snare                            technique                           G0500, Moderate sedation services provided by the                            same physician or other qualified health care                            professional performing a gastrointestinal                            endoscopic service that sedation supports,                            requiring the presence of an independent trained                            observer to assist in the monitoring of the                             patient's level of consciousness and physiological                            status; initial 15 minutes of intra-service time;                            patient age 63 years or older (additional time may                            be reported with [phone], as appropriate) Diagnosis Code(s):        --- Professional ---  K62.1, Rectal polyp                           K92.1, Melena (includes Hematochezia)                           K57.30, Diverticulosis of large intestine without                            perforation or abscess without bleeding CPT copyright 2016 [employer]. All rights reserved. The codes documented in this report are preliminary and upon coder review may  be revised to meet current compliance requirements. Gatha Mayer, MD 10/14/2017 3:46:42 PM This report has been signed electronically. Number of Addenda: 0

## 2017-10-14 NOTE — H&P (View-Only) (Signed)
[facility] Consult: 9:20 AM 10/13/2017  LOS: 0 days    Referring Provider: Dr Wynelle Cleveland  Primary Care Physician:  Patient, No Pcp Per.  Previously [name].   Primary Gastroenterologist:  unassigned   Reason for Consultation:  Bleeding from rectum.     HPI: INGVALD THEISEN is a 51 y.o. male.  Hx OSA.  Htn.  Obesity.  Takes 81 mg ASA and vitamin, Herbal Life supplements but no Rx meds.   Had 4 to 5 episodes of hematochezia, no abd pain or GI upset, scattered throughout the day yesterday.  Started at 8 AM.  No systemic sxs: able to walk 3 miles in park in the AM, go to work as Cabin crew.  Never dizzy, no CP or palpitations.   For about 6 months having intermittent scant BPR and occasional smears of brown stool after a normal formed "turd".  Ongoing weight loss efforts: 70 # 2 years ago when he went off BP meds and his CPAP.  In last 4 weeks, dropped 7 #, his goal is to achieve 30# wt loss.  Last BM this AM was some dark blood and some brown stool.     Hypertensive to 174/113.  Tachy to 111.   Hgb 11.  MCV 85.  BUN, coags, platelets normal.  Minor increase creatinine with normal GFR.  FOBT +.    No previous EGD or colonoscopy.  GERD sxs abated with 70# wt loss.  No anorexia.   FM hx + for heart dz: brothers died at 38 and 71, Dad at age 60.  Mom died in her 48s with ? Leukemia.  No known colorectal disease or cancers.   No NSAIDs, no tobacco.  No ETOH for many weeks and generally moderate if/when he drinks.  No hx liver disease.    Past Medical History:  Diagnosis Date  . Hypertension   . Obesity (BMI 35.0-39.9 without comorbidity)   . Sleep apnea     History reviewed. No pertinent surgical history.  Prior to Admission medications   Medication Sig Start Date End Date Taking? Authorizing  Provider  aspirin EC 81 MG tablet Take 81 mg by mouth daily.   Yes [provider]    Scheduled Meds:  Infusions:  PRN Meds: acetaminophen **OR** acetaminophen, hydrALAZINE, ondansetron **OR** ondansetron (ZOFRAN) IV   Allergies as of 10/12/2017  . (No Known Allergies)    Family History  Problem Relation Age of Onset  . Hypertension Other   . Hypertension Brother   . Colon cancer Neg Hx     Social History   Social History  . Marital status: Married    Spouse name: N/A  . Number of children: N/A  . Years of education: N/A   Occupational History  . Not on file.   Social History Main Topics  . Smoking status: Never Smoker  . Smokeless tobacco: Never Used  . Alcohol use Yes     Comment: occ  . Drug use: No  . Sexual activity: Not on file   Other Topics Concern  .  Not on file   Social History Narrative  . No narrative on file    REVIEW OF SYSTEMS: Constitutional:  No weakness. No malaise. ENT:  No nose bleeds Pulm:  No trouble breathing. Able to walk a ong flight of stairs without difficulty. Also exercises regularly without problems.  His wife has noticed that he doesn't have any snoring or apneic spells that he used to have before he lost weight. CV:  No palpitations, no LE edema. No chest pain. GU:  No hematuria, no frequency GI:  Per HPI.  No heartburn or dysphagia. Heme:  No unusual patterns of excessive bleeding or bruising.   Transfusions:  Has never received transfusion of blood products. Neuro:  No headaches, no peripheral tingling or numbness Derm:  No itching, no rash or sores.  Endocrine:  No sweats or chills.  No polyuria or dysuria Immunization:  Did not inquire as to recent or past immunizations. Travel:  None beyond local counties in last few months.    PHYSICAL EXAM: Vital signs in last 24 hours: Vitals:   10/13/17 0408 10/13/17 0529  BP: (!) 144/115 (!) 137/91  Pulse: 100 99  Resp:  17  Temp: 98 F (36.7 C) 97.8 F (36.6  C)  SpO2: 98% 95%   Wt Readings from Last 3 Encounters:  10/13/17 119.5 kg (263 lb 7.2 oz)    General: obese, alert, comfortable. Does not look ill. Head:  No facial asymmetry or swelling. No signs of head trauma.  Eyes:  No conjunctival pallor.EOMI. Ears:  Not hard of hearing  Nose:  No discharge or congestion Mouth:  Good dentition. Tongue midline. Oral mucosa pink, moist, clear. Neck:  No JVD, no thyromegaly. No masses or bruits. Lungs:  nonlabored breathing. No cough. Lungs clear bilaterally. Heart: RRR. No MRG. S1, S2 present. Abdomen:  Obese, soft. Nontender. No organomegaly. No masses, no bruits no hernias..   Rectal: in the ED he had no rectal masses, no hemorrhoids, no fissures. There was bright red blood intermixed with light brown stool on exam glove. Rectal exam was not repeated   Musc/Skeltl: no joint swelling or gross deformities. Extremities:  No CCE.  Neurologic:  Alert. Oriented times 3. Excellent historian. Moves all 4 limbs, strength not tested. No tremors no gross deficits. Skin:  No rashes, no sores, no purpura, no bruising. Tattoos:  None observed Nodes:  o cervical or inguinal adenopathy.   Psych:  Pleasant, cooperative. Fluid speech. Not anxious or depressed  LAB RESULTS:  Recent Labs  10/12/17 1713 10/13/17 0435  WBC 5.2 5.7  HGB 12.7* 11.0*  HCT 39.2 34.0*  PLT 226 196   BMET Lab Results  Component Value Date   NA 139 10/13/2017   NA 139 10/12/2017   K 4.0 10/13/2017   K 3.6 10/12/2017   CL 109 10/13/2017   CL 107 10/12/2017   CO2 24 10/13/2017   CO2 25 10/12/2017   GLUCOSE 118 (H) 10/13/2017   GLUCOSE 132 (H) 10/12/2017   BUN 17 10/13/2017   BUN 18 10/12/2017   CREATININE 1.37 (H) 10/13/2017   CREATININE 1.48 (H) 10/12/2017   CALCIUM 8.5 (L) 10/13/2017   CALCIUM 8.9 10/12/2017   LFT  Recent Labs  10/12/17 1713  PROT 6.7  ALBUMIN 3.8  AST 20  ALT 19  ALKPHOS 64  BILITOT 0.7   PT/INR Lab Results  Component Value Date    INR 1.09 10/13/2017    IMPRESSION:   *  Painless hematochezia.  Rule  out diverticulosis, rule out colon mass, rule out hemorrhoidal bleeding  *  Hypertension.  No meds at home.  His blood pressure had improved when he lost 70 pounds but looks like he may need to restart antihypertensives.   *  Obesity.  Through moderate exercise and diet, patient lost 70 pounds 2 years ago and has lost 7 pounds in the last month.  BMI remains in obese range at 36.  He is to be commended for his significant weight reduction and lifestyle changes.    *  OSA. Patient stopped using CPAP when he lost weight.    PLAN:     *  Colonoscopy set for 1 PM tomorrow.  Risks and benefits of the procedure discussed with patient along with the prep protocol. Patient is agreeable to proceed.  *  Clear liquid diet today, split dose moviprep begins tonight.   Azucena Freed  10/13/2017, 9:20 AM Pager: [phone]    [facility] Attending   I have taken an interval history, reviewed the chart and examined the patient. I agree with the Advanced Practitioner's note, impression and recommendations.    Gatha Mayer, MD, [facility] [phone] (pager) 10/13/2017 5:57 PM

## 2017-10-14 NOTE — Progress Notes (Addendum)
[facility] Progress Note  Patient: Craig Collins ERX:[id number]   PCP: Patient, No Pcp Per DOB: [birth year]   DOA: 10/12/2017   DOS: 10/14/2017   Date of Service: the patient was seen and examined on 10/14/2017  Subjective: Feeling better, no further bowel movement with blood. Patient at the end of his bowel prep had only clear liquid coming out without any active bleeding.  Brief hospital course: Pt. with PMH of hypertension and sleep apnea; admitted on 10/12/2017, presented with complaint of maroon color bowel movement, was found to have hematochezia likely from diverticular bleed. Currently further plan is monitor overnight.  Assessment and Plan: 1.  Hematochezia Diverticular bleed. S/P colonoscopy. One polyp and diverticulosis in the sigmoid colon. No active bleeding identified. Hemogram and relatively stable. GI recommends to monitor overnight and discharge his hemoglobin stable in the morning. Avoid NSAIDs.  2. Acute blood loss anemia. B-12 deficiency. Hemoglobin dropped, transfuse for hemoglobin less than 7. B-12 relatively low, we will provide 1 dose of subcutaneous injection followed by daily supplement. Recommend to recheck as an outpatient.  3. Obesity. OSA. Outpatient follow-up.  4. Elevated serum creatinine. GFR more than 60. CR CL 112. Continue to monitor for now.  Diet: Cardiac diet DVT Prophylaxis: mechanical compression device  Advance goals of care discussion: full code  Family Communication: no family was present at bedside, at the time of interview.  Disposition:  Discharge to home.  Consultants: gastroenterology  Procedures: colonoscopy   Antibiotics: Anti-infectives    None       Objective: Physical Exam: Vitals:   10/14/17 1540 10/14/17 1546 10/14/17 1557 10/14/17 1605  BP: 133/78 124/82 122/74 133/77  Pulse: 98 100 97 91  Resp: 19 19 17  (!) 24  Temp:      TempSrc:      SpO2: 100% 100% 100% 98%  Weight:      Height:          Intake/Output Summary (Last 24 hours) at 10/14/17 1717 Last data filed at 10/14/17 1500  Gross per 24 hour  Intake          1010.33 ml  Output                0 ml  Net          1010.33 ml   Filed Weights   10/13/17 0320 10/14/17 1351  Weight: 119.5 kg (263 lb 7.2 oz) 119.5 kg (263 lb 7.2 oz)   General: Alert, Awake and Oriented to Time, Place and Person. Appear in mild distress, affect appropriate Eyes: PERRL, Conjunctiva normal ENT: Oral Mucosa clear moist. Neck: no JVD, no Abnormal Mass Or lumps Cardiovascular: S1 and S2 Present, no Murmur, Peripheral Pulses Present Respiratory: normal respiratory effort, Bilateral Air entry equal and Decreased, no use of accessory muscle, Clear to Auscultation, no Crackles, no wheezes Abdomen: Bowel Sound present, Soft and no tenderness, no hernia Skin: no redness, no Rash, no induration Extremities: no Pedal edema, no calf tenderness Neurologic: Grossly no focal neuro deficit. Bilaterally Equal motor strength  Data Reviewed: CBC:  Recent Labs Lab 10/12/17 1713 10/13/17 0435 10/13/17 1814 10/14/17 0458  WBC 5.2 5.7 6.5 5.3  HGB 12.7* 11.0* 10.3* 9.7*  HCT 39.2 34.0* 31.8* 29.8*  MCV 84.3 85.0 85.0 84.7  PLT 226 196 175 950   Basic Metabolic Panel:  Recent Labs Lab 10/12/17 1713 10/13/17 0435 10/14/17 0458  NA 139 139 138  K 3.6 4.0 3.6  CL 107 109 109  CO2  25 24 23   GLUCOSE 132* 118* 108*  BUN 18 17 10   CREATININE 1.48* 1.37* 1.34*  CALCIUM 8.9 8.5* 8.3*  MG  --  1.9  --     Liver Function Tests:  Recent Labs Lab 10/12/17 1713  AST 20  ALT 19  ALKPHOS 64  BILITOT 0.7  PROT 6.7  ALBUMIN 3.8   No results for input(s): LIPASE, AMYLASE in the last 168 hours. No results for input(s): AMMONIA in the last 168 hours. Coagulation Profile:  Recent Labs Lab 10/13/17 0009  INR 1.09   Cardiac Enzymes: No results for input(s): CKTOTAL, CKMB, CKMBINDEX, TROPONINI in the last 168 hours. BNP (last 3 results) No  results for input(s): PROBNP in the last 8760 hours. CBG:  Recent Labs Lab 10/13/17 0755 10/13/17 1516 10/14/17 0138 10/14/17 0752  GLUCAP 106* 95 104* 109*   Studies: No results found.  Scheduled Meds: . [START ON 10/15/2017] vitamin B-12  1,000 mcg Oral Daily   Continuous Infusions: PRN Meds: acetaminophen **OR** acetaminophen, hydrALAZINE, ondansetron **OR** ondansetron (ZOFRAN) IV  Time spent: 35 minutes  Author: Berle Mull, MD [facility] Pager: [phone] 10/14/2017 5:17 PM  If 7PM-7AM, please contact night-coverage at [email or web address], password [email or web address]

## 2017-10-15 LAB — CBC
HCT: 28.1 % — ABNORMAL LOW (ref 39.0–52.0)
HCT: 29.1 % — ABNORMAL LOW (ref 39.0–52.0)
Hemoglobin: 9.2 g/dL — ABNORMAL LOW (ref 13.0–17.0)
Hemoglobin: 9.6 g/dL — ABNORMAL LOW (ref 13.0–17.0)
MCH: 27.8 pg (ref 26.0–34.0)
MCH: 28.2 pg (ref 26.0–34.0)
MCHC: 32.7 g/dL (ref 30.0–36.0)
MCHC: 33 g/dL (ref 30.0–36.0)
MCV: 84.9 fL (ref 78.0–100.0)
MCV: 85.6 fL (ref 78.0–100.0)
PLATELETS: 185 10*3/uL (ref 150–400)
PLATELETS: 184 10*3/uL (ref 150–400)
RBC: 3.31 MIL/uL — ABNORMAL LOW (ref 4.22–5.81)
RBC: 3.4 MIL/uL — ABNORMAL LOW (ref 4.22–5.81)
RDW: 13.2 % (ref 11.5–15.5)
RDW: 13 % (ref 11.5–15.5)
WBC: 4.6 10*3/uL (ref 4.0–10.5)
WBC: 4.5 10*3/uL (ref 4.0–10.5)

## 2017-10-15 LAB — GLUCOSE, CAPILLARY
GLUCOSE-CAPILLARY: 129 mg/dL — AB (ref 65–99)
Glucose-Capillary: 103 mg/dL — ABNORMAL HIGH (ref 65–99)

## 2017-10-15 MED ORDER — CYANOCOBALAMIN 1000 MCG PO TABS: 1000.0000 ug | ORAL_TABLET | Freq: Every day | ORAL | 0 refills | Status: DC

## 2017-10-15 NOTE — Progress Notes (Signed)
Ether Griffins to be D/C'd Home per MD order.  Discussed with the patient and all questions fully answered.  VSS, Skin clean, dry and intact without evidence of skin break down, no evidence of skin tears noted. IV catheter discontinued intact. Site without signs and symptoms of complications. Dressing and pressure applied.  An After Visit Summary was printed and given to the patient. Patient received prescription.  D/c education completed with patient/family including follow up instructions, medication list, d/c activities limitations if indicated, with other d/c instructions as indicated by MD - patient able to verbalize understanding, all questions fully answered.   Patient instructed to return to ED, call 911, or call MD for any changes in condition.   Patient refused to be escorted and D/C'd home via private auto.  Christoper Fabian Anaiz Qazi 10/15/2017 12:57 PM

## 2017-10-17 ENCOUNTER — Encounter (HOSPITAL_COMMUNITY): Payer: Self-pay | Admitting: Internal Medicine

## 2017-10-19 NOTE — Discharge Summary (Signed)
[facility] Discharge Summary   Patient: Craig Collins [id number]   PCP: Patient, No Pcp Per DOB: 06-30-1966   Date of admission: 10/12/2017   Date of discharge: 10/15/2017   Discharge Diagnoses:  Principal Problem:   Rectal bleeding Active Problems:   Acute blood loss anemia   Acute renal failure (ARF) (HCC)   Acute GI bleeding   Hematochezia   Benign neoplasm of rectum   Diverticulosis of colon with hemorrhage   Admitted From: home Disposition:  home  Recommendations for Outpatient Follow-up:  1. Follow-up with GI as recommended. 2. Follow-up with PCP in 1 week with repeat CBC.  Follow-up [place]. Go on 10/28/2017.   Specialty:  Internal Medicine Why:  Post hospital follow up scheduled for 10/28/2017 at 9:30am with Molli Barrows NP Contact information: [place] [phone]       [facility]. Schedule an appointment as soon as possible for a visit in 3 month(s).   Specialty:  Gastroenterology Contact information: [address] [place] [address] [phone]         Diet recommendation: Regular diet  Activity: The patient is advised to gradually reintroduce usual activities.  Discharge Condition: good  Code Status: Full code  History of present illness: As per the H and P dictated on admission, "Craig Collins is a 51 y.o. male with history of hypertension and sleep apnea who has not been taking any medications and has not been to a physician for many years presents to the ER after patient had an episode of rectal bleeding.  Patient states yesterday morning patient had an episode of frank rectal bleeding.  Denies any abdominal pain nausea vomiting.  Denies any dizziness or weakness.  Denies taking any NSAIDs but does take baby aspirin.  ED Course: In the ER just before I was about examined patient had another episode of rectal  bleeding.  Patient was mildly tachycardic but blood pressure was elevated.  Abdomen appears benign and hemoglobin was around 12.  Patient is being admitted for rectal bleeding likely lower GI."  Hospital Course:  Summary of his active problems in the hospital is as following. 1.  Hematochezia Diverticular bleed. S/P colonoscopy. One polyp and diverticulosis in the sigmoid colon. No active bleeding identified. Hemogram relatively stable. Avoid NSAIDs. Follow-up with GI as needed.  2. Acute blood loss anemia. B-12 deficiency. Hemoglobin dropped, transfuse for hemoglobin less than 7. B-12 relatively low, we will provide 1 dose of subcutaneous injection followed by daily supplement. Recommend to recheck as an outpatient.  3. Obesity. OSA. Outpatient follow-up.  4. Elevated serum creatinine. GFR more than 60. CR CL 112. Continue to monitor for now.  All other chronic medical condition were stable during the hospitalization.  Patient was ambulatory without any assistance. On the day of the discharge the patient's vitals were stable, and no other acute medical condition were reported by patient. the patient was felt safe to be discharge at home with family.  Procedures and Results:  colonoscopy   Consultations:  Gastroenterology  DISCHARGE MEDICATION: Discharge Medication List as of 10/15/2017 12:32 PM    START taking these medications   Details  vitamin B-12 1000 MCG tablet Take 1 tablet (1,000 mcg total) by mouth daily., Starting Thu 10/15/2017, Print      CONTINUE these medications which have NOT CHANGED   Details  aspirin EC 81 MG tablet Take 81 mg by  mouth daily., Until Discontinued, Historical Med      STOP taking these medications     meloxicam (MOBIC) 15 MG tablet        No Known Allergies Discharge Instructions    Diet - low sodium heart healthy    Complete by:  As directed    Discharge instructions    Complete by:  As directed    It is important  that you read following instructions as well as go over your medication list with RN to help you understand your care after this hospitalization.  Discharge Instructions: Please follow-up with PCP in one week  Please request your primary care physician to go over all Hospital Tests and Procedure/Radiological results at the follow up,  Please get all Hospital records sent to your PCP by signing hospital release before you go home.   Do not take more than prescribed Pain, Sleep and Anxiety Medications. You were cared for by a hospitalist during your hospital stay. If you have any questions about your discharge medications or the care you received while you were in the hospital after you are discharged, you can call the unit and ask to speak with the hospitalist on call if the hospitalist that took care of you is not available.  Once you are discharged, your primary care physician will handle any further medical issues. Please note that NO REFILLS for any discharge medications will be authorized once you are discharged, as it is imperative that you return to your primary care physician (or establish a relationship with a primary care physician if you do not have one) for your aftercare needs so that they can reassess your need for medications and monitor your lab values. You Must read complete instructions/literature along with all the possible adverse reactions/side effects for all the Medicines you take and that have been prescribed to you. Take any new Medicines after you have completely understood and accept all the possible adverse reactions/side effects. Wear Seat belts while driving. If you have smoked or chewed Tobacco in the last 2 yrs please stop smoking and/or stop any Recreational drug use.   Increase activity slowly    Complete by:  As directed      Discharge Exam: Filed Weights   10/13/17 0320 10/14/17 1351  Weight: 119.5 kg (263 lb 7.2 oz) 119.5 kg (263 lb 7.2 oz)   Vitals:    10/14/17 2106 10/15/17 0539  BP: (!) 154/96 (!) 143/88  Pulse: (!) 105 86  Resp: 18 20  Temp: 98.8 F (37.1 C) 98.3 F (36.8 C)  SpO2: 98% 97%   General: Appear in no distress, no Rash; Oral Mucosa moist. Cardiovascular: S1 and S2 Present, no Murmur, no JVD Respiratory: Bilateral Air entry present and Clear to Auscultation, no Crackles, no wheezes Abdomen: Bowel Sound present, Soft and no tenderness Extremities: no Pedal edema, no calf tenderness Neurology: Grossly no focal neuro deficit.  The results of significant diagnostics from this hospitalization (including imaging, microbiology, ancillary and laboratory) are listed below for reference.    Significant Diagnostic Studies: No results found.  Microbiology: No results found for this or any previous visit (from the past 240 hour(s)).   Labs: CBC:  Recent Labs Lab 10/13/17 1814 10/14/17 0458 10/14/17 1819 10/15/17 0808 10/15/17 0911  WBC 6.5 5.3 5.5 4.6 4.5  HGB 10.3* 9.7* 9.9* 9.2* 9.6*  HCT 31.8* 29.8* 30.0* 28.1* 29.1*  MCV 85.0 84.7 85.0 84.9 85.6  PLT 175 184 178 185 184   Basic  Metabolic Panel:  Recent Labs Lab 10/12/17 1713 10/13/17 0435 10/14/17 0458  NA 139 139 138  K 3.6 4.0 3.6  CL 107 109 109  CO2 25 24 23   GLUCOSE 132* 118* 108*  BUN 18 17 10   CREATININE 1.48* 1.37* 1.34*  CALCIUM 8.9 8.5* 8.3*  MG  --  1.9  --    Liver Function Tests:  Recent Labs Lab 10/12/17 1713  AST 20  ALT 19  ALKPHOS 64  BILITOT 0.7  PROT 6.7  ALBUMIN 3.8   CBG:  Recent Labs Lab 10/13/17 1516 10/14/17 0138 10/14/17 0752 10/15/17 0017 10/15/17 0809  GLUCAP 95 104* 109* 129* 103*   Time spent: 35 minutes  Signed:  Deola Rewis  [facility] 10/15/2017 , 12:37 PM

## 2017-10-23 ENCOUNTER — Encounter: Payer: Self-pay | Admitting: Internal Medicine

## 2017-10-23 NOTE — Progress Notes (Signed)
Hyperplastic rectal polyp Recall 2028 Letter created

## 2017-10-28 ENCOUNTER — Ambulatory Visit (INDEPENDENT_AMBULATORY_CARE_PROVIDER_SITE_OTHER): Payer: Self-pay | Admitting: Family Medicine

## 2017-10-28 ENCOUNTER — Encounter: Payer: Self-pay | Admitting: Family Medicine

## 2017-10-28 VITALS — BP 150/86 | HR 85 | Temp 98.3°F | Ht 71.0 in | Wt 267.0 lb

## 2017-10-28 DIAGNOSIS — I10 Essential (primary) hypertension: Secondary | ICD-10-CM

## 2017-10-28 DIAGNOSIS — R7309 Other abnormal glucose: Secondary | ICD-10-CM

## 2017-10-28 DIAGNOSIS — E669 Obesity, unspecified: Secondary | ICD-10-CM

## 2017-10-28 DIAGNOSIS — Z125 Encounter for screening for malignant neoplasm of prostate: Secondary | ICD-10-CM

## 2017-10-28 DIAGNOSIS — K5731 Diverticulosis of large intestine without perforation or abscess with bleeding: Secondary | ICD-10-CM

## 2017-10-28 DIAGNOSIS — G4733 Obstructive sleep apnea (adult) (pediatric): Secondary | ICD-10-CM

## 2017-10-28 LAB — CBC WITH DIFFERENTIAL/PLATELET
BASOS PCT: 0.6 %
Basophils Absolute: 31 cells/uL (ref 0–200)
Eosinophils Absolute: 224 cells/uL (ref 15–500)
Eosinophils Relative: 4.3 %
HEMATOCRIT: 34.3 % — AB (ref 38.5–50.0)
Hemoglobin: 11.1 g/dL — ABNORMAL LOW (ref 13.2–17.1)
LYMPHS ABS: 2018 {cells}/uL (ref 850–3900)
MCH: 27.7 pg (ref 27.0–33.0)
MCHC: 32.4 g/dL (ref 32.0–36.0)
MCV: 85.5 fL (ref 80.0–100.0)
MPV: 9.2 fL (ref 7.5–12.5)
Monocytes Relative: 9.3 %
NEUTROS PCT: 47 %
Neutro Abs: 2444 cells/uL (ref 1500–7800)
Platelets: 299 10*3/uL (ref 140–400)
RBC: 4.01 10*6/uL — AB (ref 4.20–5.80)
RDW: 13.6 % (ref 11.0–15.0)
Total Lymphocyte: 38.8 %
WBC: 5.2 10*3/uL (ref 3.8–10.8)
WBCMIX: 484 {cells}/uL (ref 200–950)

## 2017-10-28 MED ORDER — CLONIDINE HCL 0.1 MG PO TABS
0.1000 mg | ORAL_TABLET | Freq: Once | ORAL | Status: AC
  Administered 2017-10-28: 0.1 mg via ORAL

## 2017-10-28 MED ORDER — LISINOPRIL 20 MG PO TABS: 20.0000 mg | ORAL_TABLET | Freq: Every day | ORAL | 3 refills | Status: DC

## 2017-10-28 MED ORDER — HYDROCHLOROTHIAZIDE 25 MG PO TABS: 12.5000 mg | ORAL_TABLET | Freq: Every day | ORAL | 3 refills | Status: DC

## 2017-10-28 MED FILL — HYDROCHLOROTHIAZIDE 25 MG T: 25 | 30 days supply | Qty: 15 | Fill #0

## 2017-10-28 MED FILL — LISINOPRIL 20 MG TABLET: 20 | 30 days supply | Qty: 30 | Fill #0

## 2017-10-28 NOTE — Progress Notes (Signed)
Craig Collins, is a 51 y.o. male  MWU:[id number]  VOZ:[id number]  DOB - 16-Mar-1966  CC:  Chief Complaint  Patient presents with  . Establish Care  . Hospitalization Follow-up    Rectal bleeding      HPI: Craig Collins is a 51 y.o. male is here today to establish care and for hospital follow-up. On 10/12/2017, Craig Collins was admitted to [facility] for a complaint of rectal bleeding. During admission, his hemoglobin was stable, however, s/p colonoscopy, hemoglobin dropped to below 7. He was transfused 1 unit of blood. Colonoscopy findings consisted of diverticulosis of colon and the presence of one colon polyp in which pathology is pending.  [name]'s medical history is only significant for hypertension and sleep apnea . He has been treated in the past with ACE and diuretic therapy. He discontinued medication after losing approximately 70 lbs and making lifestyle changes. Current Body mass index is 37.24 kg/m.  He has a CPAP machine which he doesn't use at present, although spouse who accompanies at today's appointment notes apneic episodes throughout the night. He is uninsured and has not had a sleep study in several years. [name] reports routine physical activity, 1 hour/5 days weekly, adheres to low sodium diet, and drinks mostly water for hydration.   No Known Allergies Past Medical History:  Diagnosis Date  . Hypertension   . Obesity (BMI 35.0-39.9 without comorbidity)   . Sleep apnea    Current Outpatient Medications on File Prior to Visit  Medication Sig Dispense Refill  . aspirin EC 81 MG tablet Take 81 mg by mouth daily.    . vitamin B-12 1000 MCG tablet Take 1 tablet (1,000 mcg total) by mouth daily. 30 tablet 0   No current facility-administered medications on file prior to visit.    Family History  Problem Relation Age of Onset  . Hypertension Other   . Hypertension Brother   . Colon cancer Neg Hx    Social History   Socioeconomic History  . Marital status: Married     Spouse name: Not on file  . Number of children: Not on file  . Years of education: Not on file  . Highest education level: Not on file  Social Needs  . Financial resource strain: Not on file  . Food insecurity - worry: Not on file  . Food insecurity - inability: Not on file  . Transportation needs - medical: Not on file  . Transportation needs - non-medical: Not on file  Occupational History  . Not on file  Tobacco Use  . Smoking status: Never Smoker  . Smokeless tobacco: Never Used  Substance and Sexual Activity  . Alcohol use: Yes    Comment: occ  . Drug use: No  . Sexual activity: Not on file  Other Topics Concern  . Not on file  Social History Narrative  . Not on file    Review of Systems: Constitutional: Negative for fever, chills, diaphoresis, activity change, appetite change and fatigue. HENT: Negative for ear pain, nosebleeds, congestion, facial swelling, rhinorrhea, neck pain, neck stiffness and ear discharge.  Eyes: Negative for pain, discharge, redness, itching and visual disturbance. Respiratory: Negative for cough, choking, chest tightness, shortness of breath, wheezing and stridor.  Cardiovascular: Negative for chest pain, palpitations and leg swelling. Gastrointestinal: Negative for abdominal distention. Genitourinary: Negative for dysuria, urgency, frequency, hematuria, flank pain, decreased urine volume, difficulty urinating and dyspareunia.  Musculoskeletal: Negative for back pain, joint swelling, arthralgia and gait problem. Neurological: Negative for dizziness,  tremors, seizures, syncope, facial asymmetry, speech difficulty, weakness, light-headedness, numbness and headaches.  Hematological: Negative for adenopathy. Does not bruise/bleed easily. Psychiatric/Behavioral: Negative for hallucinations, behavioral problems, confusion, dysphoric mood, decreased concentration and agitation.    Objective:   Vitals:   10/28/17 0940 10/28/17 1035  BP: (!)  180/90 (!) 150/86  Pulse: 85   Temp: 98.3 F (36.8 C)   SpO2: 100%     Physical Exam: Constitutional: Patient appears well-developed and well-nourished. No distress. HENT: Normocephalic, atraumatic, External right and left ear normal. Oropharynx is clear and moist.  Eyes: Conjunctivae and EOM are normal. PERRLA, no scleral icterus. Neck: Normal ROM. Neck supple. No JVD. No tracheal deviation. No thyromegaly. CVS: RRR, S1/S2 +, no murmurs, no gallops, no carotid bruit.  Pulmonary: Effort and breath sounds normal, no stridor, rhonchi, wheezes, rales.  Abdominal: Soft. BS +, no distension, tenderness, rebound or guarding.  Musculoskeletal: Normal range of motion. No edema and no tenderness.  Lymphadenopathy: No lymphadenopathy noted, cervical, inguinal or axillary Neuro: Alert. Normal reflexes, muscle tone coordination. No cranial nerve deficit. Skin: Skin is warm and dry. No rash noted. Not diaphoretic. No erythema. No pallor. Psychiatric: Normal mood and affect. Behavior, judgment, thought content normal.  Lab Results  Component Value Date   WBC 4.5 10/15/2017   HGB 9.6 (L) 10/15/2017   HCT 29.1 (L) 10/15/2017   MCV 85.6 10/15/2017   PLT 184 10/15/2017   Lab Results  Component Value Date   CREATININE 1.34 (H) 10/14/2017   BUN 10 10/14/2017   NA 138 10/14/2017   K 3.6 10/14/2017   CL 109 10/14/2017   CO2 23 10/14/2017    Lab Results  Component Value Date   HGBA1C 5.7 (H) 10/14/2017   Lipid Panel  No results found for: CHOL, TRIG, HDL, CHOLHDL, VLDL, LDLCALC      Assessment and plan:  1. Accelerated essential hypertension, patient has been without antihypertensive medications for several years. Patient reports elevated BP per home readings for at least 1 month. Resuming previously prescribed blood pressure medications and doses. Treated hypertension in office with Clonidine 0.1 mg. Start Lisinopril 20 mg once daily and hydrochlorothiazide 12.5 mg.  Continue lifestyle  modifications.   2. Diverticulosis of large intestine with hemorrhage, GI bleeding resolved. Colonoscopy was significant for 1 polyp. Pathology pending. No follow-up pending with GI. Patient is asymptomatic. Will repeat CBC to check hemoglobin and check B12 level.   3. Elevated glucose- check hemoglobin A1c  4. Screening PSA (prostate specific antigen)-obtaining PSA  5. OSA (obstructive sleep apnea), provided a  Patient Assistance Application for patient to complete in order to be referred for sleep study.    6. Obesity (BMI 30-39.9), continue current physical activity and dietary management regimen.     Orders Placed This Encounter  Procedures  . CBC with Differential  . Vitamin B12  . Basic metabolic panel  . Thyroid Panel With TSH  . Hemoglobin A1c  . PSA  . Lipid panel  . Recheck vitals   Meds ordered this encounter  Medications  . cloNIDine (CATAPRES) tablet 0.1 mg  . lisinopril (PRINIVIL,ZESTRIL) 20 MG tablet    Sig: Take 1 tablet (20 mg total) daily by mouth.    Dispense:  90 tablet    Refill:  3    Order Specific Question:   Supervising Provider    Answer:   Tresa Garter [id number]  . hydrochlorothiazide (HYDRODIURIL) 25 MG tablet    Sig: Take 0.5 tablets (12.5 mg  total) daily by mouth.    Dispense:  90 tablet    Refill:  3    Order Specific Question:   Supervising Provider    Answer:   Tresa Garter [[id number]]    [id number] S. [name], MSN, FNP-C [facility]  [name] [place], [place] [address] [phone]   Return in about 4 weeks (around 11/25/2017), or Hypertension and Monitor hemoglobin.  The patient was given clear instructions to go to ER or return to medical center if symptoms don't improve, worsen or new problems develop. The patient verbalized understanding. The patient was told to call to get lab results if they haven't heard anything in the next week.     This note has been created  with Surveyor, quantity. Any transcriptional errors are unintentional.

## 2017-10-28 NOTE — Patient Instructions (Signed)
For constipation I recommend, Miralax, 2 grams twice daily as needed. Continue water intake to facilitate bulky stool production.  I am resuming your blood pressure medications as follows:   Lisinopril 20 mg once daily and hydrochlorothiazide 12.5 mg once daily.  Target BP range and blood pressure goal 140/90. Continue to monitor BP at home. Recommend DASH diet which is cardiovascular healthy in addition to your current herbal life eating plan.  Contact me once your Financial Assistance is approved in order to be referred to sleep medicine for repeat sleep study and Gastroenterology to follow-up with Dr. Carlean Purl.   DASH Eating Plan DASH stands for "Dietary Approaches to Stop Hypertension." The DASH eating plan is a healthy eating plan that has been shown to reduce high blood pressure (hypertension). It may also reduce your risk for type 2 diabetes, heart disease, and stroke. The DASH eating plan may also help with weight loss. What are tips for following this plan? General guidelines  Avoid eating more than 2,300 mg (milligrams) of salt (sodium) a day. If you have hypertension, you may need to reduce your sodium intake to 1,500 mg a day.  Limit alcohol intake to no more than 1 drink a day for nonpregnant women and 2 drinks a day for men. One drink equals 12 oz of beer, 5 oz of wine, or 1 oz of hard liquor.  Work with your health care provider to maintain a healthy body weight or to lose weight. Ask what an ideal weight is for you.  Get at least 30 minutes of exercise that causes your heart to beat faster (aerobic exercise) most days of the week. Activities may include walking, swimming, or biking.  Work with your health care provider or diet and nutrition specialist (dietitian) to adjust your eating plan to your individual calorie needs. Reading food labels  Check food labels for the amount of sodium per serving. Choose foods with less than 5 percent of the Daily Value of sodium.  Generally, foods with less than 300 mg of sodium per serving fit into this eating plan.  To find whole grains, look for the word "whole" as the first word in the ingredient list. Shopping  Buy products labeled as "low-sodium" or "no salt added."  Buy fresh foods. Avoid canned foods and premade or frozen meals. Cooking  Avoid adding salt when cooking. Use salt-free seasonings or herbs instead of table salt or sea salt. Check with your health care provider or pharmacist before using salt substitutes.  Do not fry foods. Cook foods using healthy methods such as baking, boiling, grilling, and broiling instead.  Cook with heart-healthy oils, such as olive, canola, soybean, or sunflower oil. Meal planning   Eat a balanced diet that includes: ? 5 or more servings of fruits and vegetables each day. At each meal, try to fill half of your plate with fruits and vegetables. ? Up to 6-8 servings of whole grains each day. ? Less than 6 oz of lean meat, poultry, or fish each day. A 3-oz serving of meat is about the same size as a deck of cards. One egg equals 1 oz. ? 2 servings of low-fat dairy each day. ? A serving of nuts, seeds, or beans 5 times each week. ? Heart-healthy fats. Healthy fats called Omega-3 fatty acids are found in foods such as flaxseeds and coldwater fish, like sardines, salmon, and mackerel.  Limit how much you eat of the following: ? Canned or prepackaged foods. ? Food that is high  in trans fat, such as fried foods. ? Food that is high in saturated fat, such as fatty meat. ? Sweets, desserts, sugary drinks, and other foods with added sugar. ? Full-fat dairy products.  Do not salt foods before eating.  Try to eat at least 2 vegetarian meals each week.  Eat more home-cooked food and less restaurant, buffet, and fast food.  When eating at a restaurant, ask that your food be prepared with less salt or no salt, if possible. What foods are recommended? The items listed may  not be a complete list. Talk with your dietitian about what dietary choices are best for you. Grains Whole-grain or whole-wheat bread. Whole-grain or whole-wheat pasta. Brown rice. Modena Morrow. Bulgur. Whole-grain and low-sodium cereals. Pita bread. Low-fat, low-sodium crackers. Whole-wheat flour tortillas. Vegetables Fresh or frozen vegetables (raw, steamed, roasted, or grilled). Low-sodium or reduced-sodium tomato and vegetable juice. Low-sodium or reduced-sodium tomato sauce and tomato paste. Low-sodium or reduced-sodium canned vegetables. Fruits All fresh, dried, or frozen fruit. Canned fruit in natural juice (without added sugar). Meat and other protein foods Skinless chicken or [place]. Ground chicken or [place]. Pork with fat trimmed off. Fish and seafood. Egg whites. Dried beans, peas, or lentils. Unsalted nuts, nut butters, and seeds. Unsalted canned beans. Lean cuts of beef with fat trimmed off. Low-sodium, lean deli meat. Dairy Low-fat (1%) or fat-free (skim) milk. Fat-free, low-fat, or reduced-fat cheeses. Nonfat, low-sodium ricotta or cottage cheese. Low-fat or nonfat yogurt. Low-fat, low-sodium cheese. Fats and oils Soft margarine without trans fats. Vegetable oil. Low-fat, reduced-fat, or light mayonnaise and salad dressings (reduced-sodium). Canola, safflower, olive, soybean, and sunflower oils. Avocado. Seasoning and other foods Herbs. Spices. Seasoning mixes without salt. Unsalted popcorn and pretzels. Fat-free sweets. What foods are not recommended? The items listed may not be a complete list. Talk with your dietitian about what dietary choices are best for you. Grains Baked goods made with fat, such as croissants, muffins, or some breads. Dry pasta or rice meal packs. Vegetables Creamed or fried vegetables. Vegetables in a cheese sauce. Regular canned vegetables (not low-sodium or reduced-sodium). Regular canned tomato sauce and paste (not low-sodium or reduced-sodium).  Regular tomato and vegetable juice (not low-sodium or reduced-sodium). Angie Fava. Olives. Fruits Canned fruit in a light or heavy syrup. Fried fruit. Fruit in cream or butter sauce. Meat and other protein foods Fatty cuts of meat. Ribs. Fried meat. Berniece Salines. Sausage. Bologna and other processed lunch meats. Salami. Fatback. Hotdogs. Bratwurst. Salted nuts and seeds. Canned beans with added salt. Canned or smoked fish. Whole eggs or egg yolks. Chicken or [place] with skin. Dairy Whole or 2% milk, cream, and half-and-half. Whole or full-fat cream cheese. Whole-fat or sweetened yogurt. Full-fat cheese. Nondairy creamers. Whipped toppings. Processed cheese and cheese spreads. Fats and oils Butter. Stick margarine. Lard. Shortening. Ghee. Bacon fat. Tropical oils, such as coconut, palm kernel, or palm oil. Seasoning and other foods Salted popcorn and pretzels. Onion salt, garlic salt, seasoned salt, table salt, and sea salt. Worcestershire sauce. Tartar sauce. Barbecue sauce. Teriyaki sauce. Soy sauce, including reduced-sodium. Steak sauce. Canned and packaged gravies. Fish sauce. Oyster sauce. Cocktail sauce. Horseradish that you find on the shelf. Ketchup. Mustard. Meat flavorings and tenderizers. Bouillon cubes. Hot sauce and Tabasco sauce. Premade or packaged marinades. Premade or packaged taco seasonings. Relishes. Regular salad dressings. Where to find more information:  National Heart, Lung, and [place]: https://wilson-eaton.com/  American Heart Association: www.heart.org Summary  The DASH eating plan is a healthy eating plan  that has been shown to reduce high blood pressure (hypertension). It may also reduce your risk for type 2 diabetes, heart disease, and stroke.  With the DASH eating plan, you should limit salt (sodium) intake to 2,300 mg a day. If you have hypertension, you may need to reduce your sodium intake to 1,500 mg a day.  When on the DASH eating plan, aim to eat more fresh fruits and  vegetables, whole grains, lean proteins, low-fat dairy, and heart-healthy fats.  Work with your health care provider or diet and nutrition specialist (dietitian) to adjust your eating plan to your individual calorie needs. This information is not intended to replace advice given to you by your health care provider. Make sure you discuss any questions you have with your health care provider. Document Released: 11/27/2011 Document Revised: 12/01/2016 Document Reviewed: 12/01/2016 Elsevier Interactive Patient Education  2017 Elsevier Inc.   Diverticulosis Diverticulosis is a condition that develops when small pouches (diverticula) form in the wall of the large intestine (colon). The colon is where water is absorbed and stool is formed. The pouches form when the inside layer of the colon pushes through weak spots in the outer layers of the colon. You may have a few pouches or many of them. What are the causes? The cause of this condition is not known. What increases the risk? The following factors may make you more likely to develop this condition:  Being older than age 68. Your risk for this condition increases with age. Diverticulosis is rare among people younger than age 33. By age 50, many people have it.  Eating a low-fiber diet.  Having frequent constipation.  Being overweight.  Not getting enough exercise.  Smoking.  Taking over-the-counter pain medicines, like aspirin and ibuprofen.  Having a family history of diverticulosis.  What are the signs or symptoms? In most people, there are no symptoms of this condition. If you do have symptoms, they may include:  Bloating.  Cramps in the abdomen.  Constipation or diarrhea.  Pain in the lower left side of the abdomen.  How is this diagnosed? This condition is most often diagnosed during an exam for other colon problems. Because diverticulosis usually has no symptoms, it often cannot be diagnosed independently. This condition  may be diagnosed by:  Using a flexible scope to examine the colon (colonoscopy).  Taking an X-ray of the colon after dye has been put into the colon (barium enema).  Doing a CT scan.  How is this treated? You may not need treatment for this condition if you have never developed an infection related to diverticulosis. If you have had an infection before, treatment may include:  Eating a high-fiber diet. This may include eating more fruits, vegetables, and grains.  Taking a fiber supplement.  Taking a live bacteria supplement (probiotic).  Taking medicine to relax your colon.  Taking antibiotic medicines.  Follow these instructions at home:  Drink 6-8 glasses of water or more each day to prevent constipation.  Try not to strain when you have a bowel movement.  If you have had an infection before: ? Eat more fiber as directed by your health care provider or your diet and nutrition specialist (dietitian). ? Take a fiber supplement or probiotic, if your health care provider approves.  Take over-the-counter and prescription medicines only as told by your health care provider.  If you were prescribed an antibiotic, take it as told by your health care provider. Do not stop taking the antibiotic even  if you start to feel better.  Keep all follow-up visits as told by your health care provider. This is important. Contact a health care provider if:  You have pain in your abdomen.  You have bloating.  You have cramps.  You have not had a bowel movement in 3 days. Get help right away if:  Your pain gets worse.  Your bloating becomes very bad.  You have a fever or chills, and your symptoms suddenly get worse.  You vomit.  You have bowel movements that are bloody or black.  You have bleeding from your rectum. Summary  Diverticulosis is a condition that develops when small pouches (diverticula) form in the wall of the large intestine (colon).  You may have a few pouches  or many of them.  This condition is most often diagnosed during an exam for other colon problems.  If you have had an infection related to diverticulosis, treatment may include increasing the fiber in your diet, taking supplements, or taking medicines. This information is not intended to replace advice given to you by your health care provider. Make sure you discuss any questions you have with your health care provider. Document Released: 09/04/2004 Document Revised: 10/27/2016 Document Reviewed: 10/27/2016 Elsevier Interactive Patient Education  2017 [employer].

## 2017-10-29 LAB — THYROID PANEL WITH TSH
Free Thyroxine Index: 2.4 (ref 1.4–3.8)
T3 Uptake: 30 % (ref 22–35)
T4, Total: 7.9 ug/dL (ref 4.9–10.5)
TSH: 1.26 m[IU]/L (ref 0.40–4.50)

## 2017-10-29 LAB — HEMOGLOBIN A1C
HEMOGLOBIN A1C: 5 %{Hb} (ref <5.7)
MEAN PLASMA GLUCOSE: 97 (calc)
eAG (mmol/L): 5.4 (calc)

## 2017-10-29 LAB — LIPID PANEL
CHOLESTEROL: 196 mg/dL (ref <200)
HDL: 56 mg/dL (ref >40)
LDL Cholesterol (Calc): 123 mg/dL (calc) — ABNORMAL HIGH
Non-HDL Cholesterol (Calc): 140 mg/dL (calc) — ABNORMAL HIGH (ref <130)
Total CHOL/HDL Ratio: 3.5 (calc) (ref <5.0)
Triglycerides: 76 mg/dL (ref <150)

## 2017-10-29 LAB — PSA: PSA: 17 ng/mL — AB (ref <=4.0)

## 2017-10-29 LAB — BASIC METABOLIC PANEL
BUN/Creatinine Ratio: 14 (calc) (ref 6–22)
BUN: 20 mg/dL (ref 7–25)
CO2: 22 mmol/L (ref 20–32)
CREATININE: 1.39 mg/dL — AB (ref 0.70–1.33)
Calcium: 8.9 mg/dL (ref 8.6–10.3)
Chloride: 106 mmol/L (ref 98–110)
Glucose, Bld: 88 mg/dL (ref 65–99)
POTASSIUM: 4 mmol/L (ref 3.5–5.3)
SODIUM: 137 mmol/L (ref 135–146)

## 2017-10-29 LAB — VITAMIN B12: Vitamin B-12: 475 pg/mL (ref 200–1100)

## 2017-11-03 ENCOUNTER — Telehealth: Payer: Self-pay | Admitting: Family Medicine

## 2017-11-03 DIAGNOSIS — R972 Elevated prostate specific antigen [PSA]: Secondary | ICD-10-CM

## 2017-11-03 NOTE — Telephone Encounter (Signed)
Craig Collins,  Please schedule a 3 month lab visit for PSA level.  Craig Collins. [name], MSN, FNP-C [facility]  [address] [name],  [address] [phone]

## 2017-11-03 NOTE — Telephone Encounter (Signed)
Phone patient to advise of recent lab results.  Advised him that his LDL was elevated and I recommended lifestyle modification such as decreasing saturated fat intake and intake of fried foods.  Increase in physical activity.  Also advised patient that his PSA antigen was elevated at 17ng- recommended a repeat lab in 3 months.  Patient reports that he has been evaluated by a urologist about 2 years ago for an elevated PSA antigen.  He thinks level was around the 17 range at that time.  Will recheck PSA level in 3 months.  Patient is applying for [facility] financial assistance therefore I will refer him for second opinion to urology once assistance has been approved.  In the meantime we will continue to evaluate level here in office.    Craig Collins. [name], MSN, FNP-C [facility]  [name] [place], [place] [address] [phone]

## 2017-11-04 NOTE — Telephone Encounter (Signed)
Patient notified and schedule for lab recheck in 3 months

## 2017-11-23 ENCOUNTER — Ambulatory Visit: Payer: Self-pay

## 2017-11-23 MED FILL — HYDROCHLOROTHIAZIDE 25 MG T: 25 | 30 days supply | Qty: 15 | Fill #1

## 2017-11-23 MED FILL — LISINOPRIL 20 MG TAB: 20 | 30 days supply | Qty: 30 | Fill #1

## 2017-11-25 ENCOUNTER — Ambulatory Visit (INDEPENDENT_AMBULATORY_CARE_PROVIDER_SITE_OTHER): Payer: Self-pay | Admitting: Family Medicine

## 2017-11-25 ENCOUNTER — Encounter: Payer: Self-pay | Admitting: Family Medicine

## 2017-11-25 VITALS — BP 140/82 | HR 66 | Temp 98.5°F | Resp 16 | Ht 71.0 in | Wt 280.0 lb

## 2017-11-25 DIAGNOSIS — I10 Essential (primary) hypertension: Secondary | ICD-10-CM

## 2017-11-25 DIAGNOSIS — R972 Elevated prostate specific antigen [PSA]: Secondary | ICD-10-CM

## 2017-11-25 NOTE — Progress Notes (Signed)
Patient ID: Craig Collins, male    DOB: Jun 23, 1966, 51 y.o.   MRN: [id number]  PCP: Scot Jun, FNP  Chief Complaint  Patient presents with  . Follow-up    accelerated hypertension     Subjective:  HPI Craig Collins is a 51 y.o. male presents for evaluation of hypertension and elevated prostate level. [name] reports that he has recently applied for [facility] and application is pending approval in order him to be referred to Gastroenterology for evaluation of recent GI bleed. During most recent office visit, Craig Collins was found to have accelerated hypertension. He was resumed on an anti-hypertension regimen and reports compliance with medication. He reports no home monitoring, although denies any episodes of dizziness, headaches, shortness of breath, or chest pain. Craig Collins 's PSA level was abnormally elevated during his last office visit. He reports that this is not a new problem and had been elevated for over the course of 2-3 years. He was to follow-up with urology bi annually, however he is uninsured, for this reason has been unable to follow-up. Denies active symptoms of weak urine stream , hematuria, persistent low back or hip pain, or urine frequency.  Social History   Socioeconomic History  . Marital status: Married    Spouse name: Not on file  . Number of children: Not on file  . Years of education: Not on file  . Highest education level: Not on file  Social Needs  . Financial resource strain: Not on file  . Food insecurity - worry: Not on file  . Food insecurity - inability: Not on file  . Transportation needs - medical: Not on file  . Transportation needs - non-medical: Not on file  Occupational History  . Not on file  Tobacco Use  . Smoking status: Never Smoker  . Smokeless tobacco: Never Used  Substance and Sexual Activity  . Alcohol use: Yes    Comment: occ  . Drug use: No  . Sexual activity: Not on file  Other Topics Concern  . Not on  file  Social History Narrative  . Not on file    Family History  Problem Relation Age of Onset  . Hypertension Other   . Hypertension Brother   . Colon cancer Neg Hx    Review of Systems  Constitutional: Negative.   Respiratory: Negative.   Cardiovascular: Negative.   Genitourinary: Negative.   Musculoskeletal: Negative.   Neurological: Negative.   Psychiatric/Behavioral: Negative.     Patient Active Problem List   Diagnosis Date Noted  . Benign neoplasm of rectum   . Diverticulosis of colon with hemorrhage   . Rectal bleeding 10/13/2017  . Acute blood loss anemia 10/13/2017  . Acute renal failure (ARF) ([place]) 10/13/2017  . Acute GI bleeding 10/13/2017  . Hematochezia     No Known Allergies  Prior to Admission medications   Medication Sig Start Date End Date Taking? Authorizing Provider  aspirin EC 81 MG tablet Take 81 mg by mouth daily.   Yes [provider]  hydrochlorothiazide (HYDRODIURIL) 25 MG tablet Take 0.5 tablets (12.5 mg total) daily by mouth. 10/28/17  Yes Scot Jun, FNP  lisinopril (PRINIVIL,ZESTRIL) 20 MG tablet Take 1 tablet (20 mg total) daily by mouth. 10/28/17  Yes Scot Jun, FNP  vitamin B-12 1000 MCG tablet Take 1 tablet (1,000 mcg total) by mouth daily. 10/15/17  Yes Lavina Hamman, MD    Past Medical, Surgical Family and Social History  reviewed and updated.    Objective:   Today's Vitals   11/25/17 1005 11/25/17 1044  BP: (!) 160/90 140/82  Pulse: 66   Resp: 16   Temp: 98.5 F (36.9 C)   TempSrc: Oral   SpO2: 98%   Weight: 280 lb (127 kg)   Height: 5\' 11"  (1.803 m)     Wt Readings from Last 3 Encounters:  11/25/17 280 lb (127 kg)  10/28/17 267 lb (121.1 kg)  10/14/17 263 lb 7.2 oz (119.5 kg)    Physical Exam Constitutional: Patient appears well-developed and well-nourished. No distress. HENT: Normocephalic, atraumatic, External right and left ear normal. Oropharynx is clear and moist.  Eyes:  Conjunctivae and EOM are normal. PERRLA, no scleral icterus. Neck: Normal ROM. Neck supple. No JVD. No tracheal deviation. No thyromegaly. CVS: RRR, S1/S2 +, no murmurs, no gallops, no carotid bruit.  Pulmonary: Effort and breath sounds normal, no stridor, rhonchi, wheezes, rales.  Abdominal: Soft. BS +, no distension, tenderness, rebound or guarding.  Musculoskeletal: Normal range of motion. No edema and no tenderness.  Neuro: Alert. Normal reflexes, muscle tone coordination. No cranial nerve deficit. Skin: Skin is warm and dry. No rash noted. Not diaphoretic. No erythema. No pallor. Psychiatric: Normal mood and affect. Behavior, judgment, thought content normal.   Assessment & Plan:  1. Elevated PSA, rechecking today. Patient is pending approval of [facility],-all documentation has been sumbitted. I will proceed today with referring him to urology for further evaluation and work-up.   2. Hypertension, essential, initially elevated, recheck improved. No changes in regimen today. We have discussed target BP range and blood pressure goal. I have advised patient to check BP regularly and to call us back or report to clinic if the numbers are consistently higher than 140/90. We discussed the importance of compliance with medical therapy and DASH diet recommended, consequences of uncontrolled hypertension discussed.  - continue current BP medications  Orders Placed This Encounter  Procedures  . PSA   Return for care 6 months for hypertension follow-up.    Carroll Sage. [name], MSN, FNP-C [facility]  [address] [name] [place], [place] [address] [phone]

## 2017-11-26 LAB — PSA: Prostate Specific Ag, Serum: 21.2 ng/mL — ABNORMAL HIGH (ref 0.0–4.0)

## 2018-02-04 ENCOUNTER — Other Ambulatory Visit: Payer: Self-pay

## 2018-03-23 MED FILL — LISINOPRIL 20 MG TAB: 20 | 30 days supply | Qty: 30 | Fill #2

## 2018-03-23 MED FILL — HYDROCHLOROTHIAZIDE 25 MG T: 25 | 30 days supply | Qty: 15 | Fill #2

## 2018-04-26 MED FILL — LISINOPRIL 20 MG TAB: 20 | 30 days supply | Qty: 30 | Fill #3

## 2018-04-26 MED FILL — HYDROCHLOROTHIAZIDE 25 MG T: 25 | 30 days supply | Qty: 15 | Fill #3

## 2018-05-21 MED FILL — HYDROCHLOROTHIAZIDE 25 MG T: 25 | 30 days supply | Qty: 15 | Fill #4

## 2018-05-21 MED FILL — LISINOPRIL 20 MG TAB: 20 | 30 days supply | Qty: 30 | Fill #4

## 2018-05-26 ENCOUNTER — Encounter: Payer: Self-pay | Admitting: Family Medicine

## 2018-05-26 ENCOUNTER — Ambulatory Visit (INDEPENDENT_AMBULATORY_CARE_PROVIDER_SITE_OTHER): Payer: Self-pay | Admitting: Family Medicine

## 2018-05-26 VITALS — BP 142/80 | HR 84 | Temp 97.8°F | Ht 71.0 in | Wt 273.0 lb

## 2018-05-26 DIAGNOSIS — I10 Essential (primary) hypertension: Secondary | ICD-10-CM

## 2018-05-26 DIAGNOSIS — R972 Elevated prostate specific antigen [PSA]: Secondary | ICD-10-CM

## 2018-05-26 DIAGNOSIS — Z09 Encounter for follow-up examination after completed treatment for conditions other than malignant neoplasm: Secondary | ICD-10-CM

## 2018-05-26 NOTE — Progress Notes (Signed)
POCT

## 2018-05-29 MED ORDER — HYDROCHLOROTHIAZIDE 25 MG PO TABS: 12.5000 mg | ORAL_TABLET | Freq: Every day | ORAL | 6 refills | Status: DC

## 2018-05-29 NOTE — Progress Notes (Signed)
Subjective:    Patient ID: Craig Collins, male    DOB: 1966-02-22, 52 y.o.   MRN: [id number]   PCP: Craig Becton, NP  Chief Complaint  Patient presents with  . Follow-up    HTN   HPI  Craig Collins has a history of Sleep Apnea, Obesity, and Hypertension. He is here today for follow up.   Current Status: He is doing well. He continues to work on his weight loss. He denies fevers, chills, recent infections, fatigue, and night sweats. Denies visual changes, headaches, dizziness, and falls. Denies cough, chest pain, heart palpitations, and shortness of breath.   Denies GI symptoms.   Denies pain.   Past Medical History:  Diagnosis Date  . Hypertension   . Obesity (BMI 35.0-39.9 without comorbidity)   . Sleep apnea     Family History  Problem Relation Age of Onset  . Hypertension Other   . Hypertension Brother   . Colon cancer Neg Hx     Social History   Socioeconomic History  . Marital status: Married    Spouse name: Not on file  . Number of children: Not on file  . Years of education: Not on file  . Highest education level: Not on file  Occupational History  . Not on file  Social Needs  . Financial resource strain: Not on file  . Food insecurity:    Worry: Not on file    Inability: Not on file  . Transportation needs:    Medical: Not on file    Non-medical: Not on file  Tobacco Use  . Smoking status: Never Smoker  . Smokeless tobacco: Never Used  Substance and Sexual Activity  . Alcohol use: Yes    Comment: occ  . Drug use: No  . Sexual activity: Not on file  Lifestyle  . Physical activity:    Days per week: Not on file    Minutes per session: Not on file  . Stress: Not on file  Relationships  . Social connections:    Talks on phone: Not on file    Gets together: Not on file    Attends religious service: Not on file    Active member of club or organization: Not on file    Attends meetings of clubs or organizations: Not on file    Relationship  status: Not on file  . Intimate partner violence:    Fear of current or ex partner: Not on file    Emotionally abused: Not on file    Physically abused: Not on file    Forced sexual activity: Not on file  Other Topics Concern  . Not on file  Social History Narrative  . Not on file    Past Surgical History:  Procedure Laterality Date  . COLONOSCOPY N/A 10/14/2017   Procedure: COLONOSCOPY;  Surgeon: Craig Mayer, MD;  Location[facility] ENDOSCOPY;  Service: Endoscopy;  Laterality: N/A;    There is no immunization history on file for this patient.  Current Meds  Medication Sig  . aspirin EC 81 MG tablet Take 81 mg by mouth daily.  . hydrochlorothiazide (HYDRODIURIL) 25 MG tablet Take 0.5 tablets (12.5 mg total) daily by mouth.  Marland Kitchen lisinopril (PRINIVIL,ZESTRIL) 20 MG tablet Take 1 tablet (20 mg total) daily by mouth.    No Known Allergies  BP (!) 142/80 (BP Location: Left Arm, Patient Position: Sitting, Cuff Size: Large)   Pulse 84   Temp 97.8 F (36.6 C) (Oral)  Ht 5\' 11"  (1.803 m)   Wt 273 lb (123.8 kg)   SpO2 99%   BMI 38.08 kg/m      Review of Systems  Constitutional: Negative.   HENT: Negative.   Eyes: Negative.   Respiratory: Negative.   Cardiovascular: Negative.   Gastrointestinal: Negative.   Endocrine: Negative.   Genitourinary: Negative.   Musculoskeletal: Negative.   Skin: Negative.   Allergic/Immunologic: Negative.   Neurological: Negative.   Hematological: Negative.   Psychiatric/Behavioral: Negative.    Objective:   Physical Exam  Constitutional: He is oriented to person, place, and time. He appears well-developed and well-nourished.  HENT:  Head: Normocephalic and atraumatic.  Right Ear: External ear normal.  Left Ear: External ear normal.  Nose: Nose normal.  Mouth/Throat: Oropharynx is clear and moist.  Eyes: Pupils are equal, round, and reactive to light. Conjunctivae and EOM are normal.  Neck: Normal range of motion. Neck supple.   Cardiovascular: Normal rate, regular rhythm, normal heart sounds and intact distal pulses.  Pulmonary/Chest: Effort normal and breath sounds normal.  Abdominal: Soft. Bowel sounds are normal.  Musculoskeletal: Normal range of motion.  Neurological: He is alert and oriented to person, place, and time.  Skin: Skin is warm and dry. Capillary refill takes less than 2 seconds.  Psychiatric: He has a normal mood and affect. His behavior is normal. Judgment and thought content normal.  Nursing note and vitals reviewed.  Assessment & Plan:   1. Hypertension, essential Stable. Blood pressure today is stable at 142/80.  Continue Lisinopril and HCTZ as directed.  - hydrochlorothiazide (HYDRODIURIL) 25 MG tablet; Take 0.5 tablets (12.5 mg total) by mouth daily.  Dispense: 15 tablet; Refill: 6  2. Elevated PSA PSA was elevated at 21.2. Patient states that he has an initial appointment with Oncologist in July.   3. Follow up She will follow up in 6 months.   Meds ordered this encounter  Medications  . hydrochlorothiazide (HYDRODIURIL) 25 MG tablet    Sig: Take 0.5 tablets (12.5 mg total) by mouth daily.    Dispense:  15 tablet    Refill:  [place],  MSN, FNP-BC [facility] [address] [place], [place] [address] [phone]

## 2018-06-29 MED FILL — HYDROCHLOROTHIAZIDE 25 MG T: 25 | 30 days supply | Qty: 15 | Fill #5

## 2018-06-29 MED FILL — LISINOPRIL 20 MG TAB: 20 | 30 days supply | Qty: 30 | Fill #5

## 2018-08-02 MED FILL — HYDROCHLOROTHIAZIDE 25 MG T: 25 | 30 days supply | Qty: 15 | Fill #6

## 2018-08-02 MED FILL — LISINOPRIL 20 MG TAB: 20 | 30 days supply | Qty: 30 | Fill #6

## 2018-09-03 MED FILL — LISINOPRIL 20 MG TAB: 20 | 30 days supply | Qty: 30 | Fill #7

## 2018-09-03 MED FILL — HYDROCHLOROTHIAZIDE 25 MG T: 25 | 30 days supply | Qty: 15 | Fill #7

## 2018-10-05 MED FILL — LISINOPRIL 20 MG TAB: 20 | 30 days supply | Qty: 30 | Fill #8

## 2018-10-05 MED FILL — HYDROCHLOROTHIAZIDE 25 MG T: 25 | 30 days supply | Qty: 15 | Fill #8

## 2018-11-09 ENCOUNTER — Other Ambulatory Visit: Payer: Self-pay | Admitting: Family Medicine

## 2018-11-09 MED FILL — HYDROCHLOROTHIAZIDE 25 MG T: 25 | 30 days supply | Qty: 15 | Fill #0

## 2018-11-10 MED FILL — LISINOPRIL 20 MG TAB: 20 | 30 days supply | Qty: 30 | Fill #0

## 2018-11-26 ENCOUNTER — Ambulatory Visit: Payer: Self-pay | Admitting: Family Medicine

## 2018-11-29 ENCOUNTER — Ambulatory Visit: Payer: Self-pay | Admitting: Family Medicine

## 2018-12-20 MED FILL — HYDROCHLOROTHIAZIDE 25 MG T: 25 | 30 days supply | Qty: 15 | Fill #1

## 2018-12-20 MED FILL — LISINOPRIL 20 MG TAB: 20 | 30 days supply | Qty: 30 | Fill #1

## 2019-01-31 MED FILL — HYDROCHLOROTHIAZIDE 25 MG T: 25 | 30 days supply | Qty: 15 | Fill #2

## 2019-01-31 MED FILL — LISINOPRIL 20 MG TAB: 20 | 30 days supply | Qty: 30 | Fill #2

## 2019-03-03 MED FILL — LISINOPRIL 20 MG TAB: 20 | 30 days supply | Qty: 30 | Fill #3

## 2019-03-03 MED FILL — HYDROCHLOROTHIAZIDE 25 MG T: 25 | 30 days supply | Qty: 15 | Fill #3

## 2019-04-15 ENCOUNTER — Other Ambulatory Visit: Payer: Self-pay

## 2019-04-15 ENCOUNTER — Ambulatory Visit (INDEPENDENT_AMBULATORY_CARE_PROVIDER_SITE_OTHER): Payer: BLUE CROSS/BLUE SHIELD | Admitting: Family Medicine

## 2019-04-15 ENCOUNTER — Encounter: Payer: Self-pay | Admitting: Family Medicine

## 2019-04-15 VITALS — BP 140/74 | HR 88 | Temp 98.2°F | Ht 71.0 in | Wt 260.8 lb

## 2019-04-15 DIAGNOSIS — R339 Retention of urine, unspecified: Secondary | ICD-10-CM

## 2019-04-15 DIAGNOSIS — N400 Enlarged prostate without lower urinary tract symptoms: Secondary | ICD-10-CM

## 2019-04-15 DIAGNOSIS — Z09 Encounter for follow-up examination after completed treatment for conditions other than malignant neoplasm: Secondary | ICD-10-CM

## 2019-04-15 DIAGNOSIS — I10 Essential (primary) hypertension: Secondary | ICD-10-CM

## 2019-04-15 DIAGNOSIS — Z0000 Encounter for general adult medical examination without abnormal findings: Secondary | ICD-10-CM

## 2019-04-15 DIAGNOSIS — R972 Elevated prostate specific antigen [PSA]: Secondary | ICD-10-CM | POA: Diagnosis not present

## 2019-04-15 LAB — POCT URINALYSIS DIP (MANUAL ENTRY)
Bilirubin, UA: NEGATIVE
Blood, UA: NEGATIVE
Glucose, UA: NEGATIVE mg/dL
Ketones, POC UA: NEGATIVE mg/dL
Leukocytes, UA: NEGATIVE
Nitrite, UA: NEGATIVE
Protein Ur, POC: NEGATIVE mg/dL
Spec Grav, UA: 1.015 (ref 1.010–1.025)
Urobilinogen, UA: 0.2 E.U./dL
pH, UA: 6 (ref 5.0–8.0)

## 2019-04-15 NOTE — Progress Notes (Signed)
[facility]   Established Patient Office Visit  Subjective:  Patient ID: Craig Collins, male    DOB: 24-May-1966  Age: 53 y.o. MRN: [id number]  CC:  Chief Complaint  Patient presents with  . Urinary Retention    HPI Craig Collins is a 52 year old male who presents for Sick Visit today.  Past Medical History:  Diagnosis Date  . Benign prostate hyperplasia   . Elevated PSA   . Hypertension   . Obesity (BMI 35.0-39.9 without comorbidity)   . Sleep apnea   . Urinary retention    Current Status: Since his last office visit, he has urinary retention X 1 month. He states that he is experiencing difficulty beginning urinary stream. New medications that he has been using Nugenix for workouts X 2 months, for improved stamina. He has a history of elevated PSA of 21.2 on 11/25/2017. He was referred to Urology at that time, but did not follow up. He denies dysuria and hematuria.   He denies fevers, chills, fatigue, recent infections, weight loss, and night sweats. He has not had any headaches, visual changes, dizziness, and falls. No chest pain, heart palpitations, cough and shortness of breath reported. No reports of GI problems such as nausea, vomiting, diarrhea, and constipation. He has no reports of blood in stools. No depression or anxiety reported.  He denies pain today.   Past Surgical History:  Procedure Laterality Date  . COLONOSCOPY N/A 10/14/2017   Procedure: COLONOSCOPY;  Surgeon: Gatha Mayer, MD;  Location: [facility] ENDOSCOPY;  Service: Endoscopy;  Laterality: N/A;    Family History  Problem Relation Age of Onset  . Hypertension Other   . Hypertension Brother   . Colon cancer Neg Hx     Social History   Socioeconomic History  . Marital status: Married    Spouse name: Not on file  . Number of children: Not on file  . Years of education: Not on file  . Highest education level: Not on file  Occupational History  . Not on  file  Social Needs  . Financial resource strain: Not on file  . Food insecurity:    Worry: Not on file    Inability: Not on file  . Transportation needs:    Medical: Not on file    Non-medical: Not on file  Tobacco Use  . Smoking status: Never Smoker  . Smokeless tobacco: Never Used  Substance and Sexual Activity  . Alcohol use: Yes    Comment: occ  . Drug use: No  . Sexual activity: Not on file  Lifestyle  . Physical activity:    Days per week: Not on file    Minutes per session: Not on file  . Stress: Not on file  Relationships  . Social connections:    Talks on phone: Not on file    Gets together: Not on file    Attends religious service: Not on file    Active member of club or organization: Not on file    Attends meetings of clubs or organizations: Not on file    Relationship status: Not on file  . Intimate partner violence:    Fear of current or ex partner: Not on file    Emotionally abused: Not on file    Physically abused: Not on file    Forced sexual activity: Not on file  Other Topics Concern  . Not on file  Social History Narrative  .  Not on file    Outpatient Medications Prior to Visit  Medication Sig Dispense Refill  . aspirin EC 81 MG tablet Take 81 mg by mouth daily.    . hydrochlorothiazide (HYDRODIURIL) 25 MG tablet Take 0.5 tablets (12.5 mg total) by mouth daily. 15 tablet 6  . lisinopril (PRINIVIL,ZESTRIL) 20 MG tablet TAKE 1 TABLET DAILY BY MOUTH. 90 tablet 3  . NON FORMULARY Take 3 tablets by mouth daily. NUGENIX    . vitamin B-12 1000 MCG tablet Take 1 tablet (1,000 mcg total) by mouth daily. (Patient not taking: Reported on 05/26/2018) 30 tablet 0   No facility-administered medications prior to visit.     No Known Allergies  ROS Review of Systems  Constitutional: Negative.   HENT: Negative.   Eyes: Negative.   Respiratory: Negative.   Cardiovascular: Negative.   Gastrointestinal: Negative.   Endocrine: Negative.   Genitourinary:  Negative.        Urinary retention  Musculoskeletal: Negative.   Skin: Negative.   Allergic/Immunologic: Negative.   Neurological: Negative.   Hematological: Negative.   Psychiatric/Behavioral: Negative.    Objective:    Physical Exam  Constitutional: He is oriented to person, place, and time. He appears well-developed and well-nourished.  HENT:  Head: Normocephalic and atraumatic.  Eyes: Conjunctivae are normal.  Neck: Normal range of motion. Neck supple.  Cardiovascular: Normal rate, regular rhythm, normal heart sounds and intact distal pulses.  Pulmonary/Chest: Effort normal and breath sounds normal.  Abdominal: Soft. Bowel sounds are normal.  Musculoskeletal: Normal range of motion.  Neurological: He is alert and oriented to person, place, and time. He has normal reflexes.  Skin: Skin is warm and dry.  Psychiatric: He has a normal mood and affect. His behavior is normal. Judgment and thought content normal.  Nursing note and vitals reviewed.   BP 140/74 (BP Location: Right Arm, Patient Position: Sitting, Cuff Size: Large)   Pulse 88   Temp 98.2 F (36.8 C) (Oral)   Ht 5\' 11"  (1.803 m)   Wt 260 lb 12.8 oz (118.3 kg)   SpO2 99%   BMI 36.37 kg/m  Wt Readings from Last 3 Encounters:  04/15/19 260 lb 12.8 oz (118.3 kg)  05/26/18 273 lb (123.8 kg)  11/25/17 280 lb (127 kg)     There are no preventive care reminders to display for this patient.  There are no preventive care reminders to display for this patient.  Lab Results  Component Value Date   TSH 0.807 04/15/2019   Lab Results  Component Value Date   WBC 5.1 04/15/2019   HGB 14.0 04/15/2019   HCT 41.6 04/15/2019   MCV 82 04/15/2019   PLT 246 04/15/2019   Lab Results  Component Value Date   NA 142 04/15/2019   K 3.8 04/15/2019   CO2 20 04/15/2019   GLUCOSE 96 04/15/2019   BUN 14 04/15/2019   CREATININE 1.38 (H) 04/15/2019   BILITOT 0.5 04/15/2019   ALKPHOS 67 04/15/2019   AST 16 04/15/2019   ALT  17 04/15/2019   PROT 7.6 04/15/2019   ALBUMIN 4.5 04/15/2019   CALCIUM 9.8 04/15/2019   ANIONGAP 6 10/14/2017   Lab Results  Component Value Date   CHOL 183 04/15/2019   Lab Results  Component Value Date   HDL 59 04/15/2019   Lab Results  Component Value Date   LDLCALC 112 (H) 04/15/2019   Lab Results  Component Value Date   TRIG 60 04/15/2019   Lab Results  Component Value Date   CHOLHDL 3.1 04/15/2019   Lab Results  Component Value Date   HGBA1C 5.0 10/28/2017   Assessment & Plan:   1. Urinary retention Worsening.  - POCT urinalysis dipstick  2. Benign prostatic hyperplasia   3. Elevated PSA Most recent PSA was 21.2 on 11/25/2017. We will re-check PSA today.   4. Hypertension, essential Blood pressure is stable at 140/74 today. Continue HCTZ and Lisinopril as prescribed. He will continue to decrease high sodium intake, excessive alcohol intake, increase potassium intake, smoking cessation, and increase physical activity of at least 30 minutes of cardio activity daily. He will continue to follow Heart Healthy or DASH diet.  5. Healthcare maintenance - CBC with Differential - Comprehensive metabolic panel - Lipid Panel - Vitamin B12 - TSH - PSA  6. Follow up He will follow up in 3 months.  We will send referral to Urology today.   Meds ordered this encounter  Medications  . finasteride (PROSCAR) 5 MG tablet    Sig: Take 1 tablet (5 mg total) by mouth daily.    Dispense:  30 tablet    Refill:  3    Orders Placed This Encounter  Procedures  . CBC with Differential  . Comprehensive metabolic panel  . Lipid Panel  . Vitamin B12  . TSH  . PSA  . Ambulatory referral to Urology  . POCT urinalysis dipstick     Referral Orders     Ambulatory referral to Urology  Kathe Becton,  MSN, [place] [place], [place] [address] [phone]   Problem List Items Addressed This Visit     None    Visit Diagnoses    Urinary retention    -  Primary   Relevant Medications   finasteride (PROSCAR) 5 MG tablet   Other Relevant Orders   POCT urinalysis dipstick (Completed)   Ambulatory referral to Urology   Benign prostatic hyperplasia with elevated prostate specific antigen (PSA)       Relevant Medications   finasteride (PROSCAR) 5 MG tablet   Other Relevant Orders   Ambulatory referral to Urology   Elevated PSA       Relevant Medications   finasteride (PROSCAR) 5 MG tablet   Other Relevant Orders   Ambulatory referral to Urology   Hypertension, essential       Healthcare maintenance       Relevant Orders   CBC with Differential (Completed)   Comprehensive metabolic panel (Completed)   Lipid Panel (Completed)   Vitamin B12 (Completed)   TSH (Completed)   PSA (Completed)   Follow up          Meds ordered this encounter  Medications  . finasteride (PROSCAR) 5 MG tablet    Sig: Take 1 tablet (5 mg total) by mouth daily.    Dispense:  30 tablet    Refill:  3    Follow-up: Return in about 3 months (around 07/15/2019).    Azzie Glatter, FNP

## 2019-04-16 ENCOUNTER — Encounter: Payer: Self-pay | Admitting: Family Medicine

## 2019-04-16 DIAGNOSIS — R339 Retention of urine, unspecified: Secondary | ICD-10-CM | POA: Insufficient documentation

## 2019-04-16 DIAGNOSIS — N400 Enlarged prostate without lower urinary tract symptoms: Secondary | ICD-10-CM | POA: Insufficient documentation

## 2019-04-16 DIAGNOSIS — R972 Elevated prostate specific antigen [PSA]: Secondary | ICD-10-CM | POA: Insufficient documentation

## 2019-04-16 DIAGNOSIS — I10 Essential (primary) hypertension: Secondary | ICD-10-CM | POA: Insufficient documentation

## 2019-04-16 LAB — CBC WITH DIFFERENTIAL/PLATELET
Basophils Absolute: 0 10*3/uL (ref 0.0–0.2)
Basos: 0 %
EOS (ABSOLUTE): 0.2 10*3/uL (ref 0.0–0.4)
Eos: 4 %
Hematocrit: 41.6 % (ref 37.5–51.0)
Hemoglobin: 14 g/dL (ref 13.0–17.7)
Immature Grans (Abs): 0 10*3/uL (ref 0.0–0.1)
Immature Granulocytes: 0 %
Lymphocytes Absolute: 1.6 10*3/uL (ref 0.7–3.1)
Lymphs: 31 %
MCH: 27.5 pg (ref 26.6–33.0)
MCHC: 33.7 g/dL (ref 31.5–35.7)
MCV: 82 fL (ref 79–97)
Monocytes Absolute: 0.5 10*3/uL (ref 0.1–0.9)
Monocytes: 11 %
Neutrophils Absolute: 2.7 10*3/uL (ref 1.4–7.0)
Neutrophils: 54 %
Platelets: 246 10*3/uL (ref 150–450)
RBC: 5.09 x10E6/uL (ref 4.14–5.80)
RDW: 13 % (ref 11.6–15.4)
WBC: 5.1 10*3/uL (ref 3.4–10.8)

## 2019-04-16 LAB — VITAMIN B12: Vitamin B-12: 841 pg/mL (ref 232–1245)

## 2019-04-16 LAB — COMPREHENSIVE METABOLIC PANEL
ALT: 17 IU/L (ref 0–44)
AST: 16 IU/L (ref 0–40)
Albumin/Globulin Ratio: 1.5 (ref 1.2–2.2)
Albumin: 4.5 g/dL (ref 3.8–4.9)
Alkaline Phosphatase: 67 IU/L (ref 39–117)
BUN/Creatinine Ratio: 10 (ref 9–20)
BUN: 14 mg/dL (ref 6–24)
Bilirubin Total: 0.5 mg/dL (ref 0.0–1.2)
CO2: 20 mmol/L (ref 20–29)
Calcium: 9.8 mg/dL (ref 8.7–10.2)
Chloride: 103 mmol/L (ref 96–106)
Creatinine, Ser: 1.38 mg/dL — ABNORMAL HIGH (ref 0.76–1.27)
GFR calc Af Amer: 67 mL/min/{1.73_m2} (ref >59)
GFR calc non Af Amer: 58 mL/min/{1.73_m2} — ABNORMAL LOW (ref >59)
Globulin, Total: 3.1 g/dL (ref 1.5–4.5)
Glucose: 96 mg/dL (ref 65–99)
Potassium: 3.8 mmol/L (ref 3.5–5.2)
Sodium: 142 mmol/L (ref 134–144)
Total Protein: 7.6 g/dL (ref 6.0–8.5)

## 2019-04-16 LAB — LIPID PANEL
Chol/HDL Ratio: 3.1 ratio (ref 0.0–5.0)
Cholesterol, Total: 183 mg/dL (ref 100–199)
HDL: 59 mg/dL (ref >39)
LDL Calculated: 112 mg/dL — ABNORMAL HIGH (ref 0–99)
Triglycerides: 60 mg/dL (ref 0–149)
VLDL Cholesterol Cal: 12 mg/dL (ref 5–40)

## 2019-04-16 LAB — PSA: Prostate Specific Ag, Serum: 42.3 ng/mL — ABNORMAL HIGH (ref 0.0–4.0)

## 2019-04-16 LAB — TSH: TSH: 0.807 u[IU]/mL (ref 0.450–4.500)

## 2019-04-16 MED ORDER — FINASTERIDE 5 MG PO TABS: 5.0000 mg | ORAL_TABLET | Freq: Every day | ORAL | 3 refills | Status: DC

## 2019-04-22 MED FILL — HYDROCHLOROTHIAZIDE 25 MG T: 25 | 30 days supply | Qty: 15 | Fill #4

## 2019-04-22 MED FILL — LISINOPRIL 20 MG TAB: 20 | 30 days supply | Qty: 30 | Fill #4

## 2019-05-10 DIAGNOSIS — R3914 Feeling of incomplete bladder emptying: Secondary | ICD-10-CM | POA: Diagnosis not present

## 2019-05-10 DIAGNOSIS — N403 Nodular prostate with lower urinary tract symptoms: Secondary | ICD-10-CM | POA: Diagnosis not present

## 2019-05-10 DIAGNOSIS — R972 Elevated prostate specific antigen [PSA]: Secondary | ICD-10-CM | POA: Diagnosis not present

## 2019-05-17 MED FILL — levoFLOXacin 750 MG TABS: 750 | 1 days supply | Qty: 1 | Fill #0

## 2019-05-17 MED FILL — TAMSULOSIN HCL 0.4 MG CAP: 0.4 | 30 days supply | Qty: 30 | Fill #0

## 2019-05-23 DIAGNOSIS — C61 Malignant neoplasm of prostate: Secondary | ICD-10-CM

## 2019-05-23 HISTORY — DX: Malignant neoplasm of prostate: C61

## 2019-06-02 DIAGNOSIS — R972 Elevated prostate specific antigen [PSA]: Secondary | ICD-10-CM | POA: Diagnosis not present

## 2019-06-03 ENCOUNTER — Other Ambulatory Visit: Payer: Self-pay | Admitting: Family Medicine

## 2019-06-03 DIAGNOSIS — I10 Essential (primary) hypertension: Secondary | ICD-10-CM

## 2019-06-03 MED FILL — LISINOPRIL 20 MG TABLET: 20 | 30 days supply | Qty: 30 | Fill #5

## 2019-06-16 ENCOUNTER — Telehealth: Payer: Self-pay

## 2019-06-16 ENCOUNTER — Other Ambulatory Visit: Payer: Self-pay

## 2019-06-16 DIAGNOSIS — C61 Malignant neoplasm of prostate: Secondary | ICD-10-CM | POA: Diagnosis not present

## 2019-06-16 DIAGNOSIS — I10 Essential (primary) hypertension: Secondary | ICD-10-CM

## 2019-06-16 MED ORDER — HYDROCHLOROTHIAZIDE 25 MG PO TABS: 12.5000 mg | ORAL_TABLET | Freq: Every day | ORAL | 0 refills | Status: DC

## 2019-06-16 MED FILL — HYDROCHLOROTHIAZIDE 25 MG T: 25 | 30 days supply | Qty: 15 | Fill #0

## 2019-06-16 NOTE — Telephone Encounter (Signed)
30 day supply sent to pharmacy until patient come in for follow up

## 2019-06-16 NOTE — Telephone Encounter (Signed)
Patient notified

## 2019-06-17 ENCOUNTER — Other Ambulatory Visit: Payer: Self-pay | Admitting: Urology

## 2019-06-17 DIAGNOSIS — C61 Malignant neoplasm of prostate: Secondary | ICD-10-CM

## 2019-07-01 ENCOUNTER — Encounter (HOSPITAL_COMMUNITY)
Admission: RE | Admit: 2019-07-01 | Discharge: 2019-07-01 | Disposition: A | Discharge status: Home / Self Care | Payer: BC Managed Care – PPO | Source: Ambulatory Visit | Attending: Urology | Admitting: Urology

## 2019-07-01 ENCOUNTER — Other Ambulatory Visit: Payer: Self-pay

## 2019-07-01 DIAGNOSIS — R590 Localized enlarged lymph nodes: Secondary | ICD-10-CM | POA: Diagnosis not present

## 2019-07-01 DIAGNOSIS — C61 Malignant neoplasm of prostate: Secondary | ICD-10-CM | POA: Diagnosis not present

## 2019-07-01 MED ORDER — TECHNETIUM TC 99M MEDRONATE IV KIT
20.0000 | PACK | Freq: Once | INTRAVENOUS | Status: AC | PRN
  Administered 2019-07-01: 22 via INTRAVENOUS

## 2019-07-11 MED FILL — HYDROCHLOROTHIAZIDE 25 MG T: 25 | 30 days supply | Qty: 15 | Fill #1

## 2019-07-11 MED FILL — LISINOPRIL 20 MG TABLET: 20 | 30 days supply | Qty: 30 | Fill #6

## 2019-07-14 DIAGNOSIS — C7951 Secondary malignant neoplasm of bone: Secondary | ICD-10-CM | POA: Diagnosis not present

## 2019-07-14 DIAGNOSIS — C61 Malignant neoplasm of prostate: Secondary | ICD-10-CM | POA: Diagnosis not present

## 2019-07-15 ENCOUNTER — Ambulatory Visit (INDEPENDENT_AMBULATORY_CARE_PROVIDER_SITE_OTHER): Payer: BC Managed Care – PPO | Admitting: Family Medicine

## 2019-07-15 ENCOUNTER — Encounter: Payer: Self-pay | Admitting: Family Medicine

## 2019-07-15 ENCOUNTER — Other Ambulatory Visit: Payer: Self-pay

## 2019-07-15 VITALS — BP 140/78 | HR 60 | Temp 98.2°F | Ht 71.0 in | Wt 270.0 lb

## 2019-07-15 DIAGNOSIS — I10 Essential (primary) hypertension: Secondary | ICD-10-CM | POA: Diagnosis not present

## 2019-07-15 DIAGNOSIS — N400 Enlarged prostate without lower urinary tract symptoms: Secondary | ICD-10-CM

## 2019-07-15 DIAGNOSIS — C61 Malignant neoplasm of prostate: Secondary | ICD-10-CM | POA: Diagnosis not present

## 2019-07-15 DIAGNOSIS — R972 Elevated prostate specific antigen [PSA]: Secondary | ICD-10-CM | POA: Diagnosis not present

## 2019-07-15 DIAGNOSIS — R339 Retention of urine, unspecified: Secondary | ICD-10-CM

## 2019-07-15 DIAGNOSIS — Z09 Encounter for follow-up examination after completed treatment for conditions other than malignant neoplasm: Secondary | ICD-10-CM

## 2019-07-15 LAB — POCT URINALYSIS DIP (MANUAL ENTRY)
Bilirubin, UA: NEGATIVE
Blood, UA: NEGATIVE
Glucose, UA: NEGATIVE mg/dL
Ketones, POC UA: NEGATIVE mg/dL
Leukocytes, UA: NEGATIVE
Nitrite, UA: NEGATIVE
Protein Ur, POC: NEGATIVE mg/dL
Spec Grav, UA: 1.02 (ref 1.010–1.025)
Urobilinogen, UA: 0.2 E.U./dL
pH, UA: 5.5 (ref 5.0–8.0)

## 2019-07-15 NOTE — Progress Notes (Signed)
[facility]   Established Patient Office Visit  Subjective:  Patient ID: Craig Collins, male    DOB: 1966-05-17  Age: 53 y.o. MRN: [id number]  CC:  Chief Complaint  Patient presents with  . Follow-up    HPI Craig Collins is a 53 year old male who presents for follow up today.   Past Medical History:  Diagnosis Date  . Benign prostate hyperplasia   . Elevated PSA   . Hypertension   . Obesity (BMI 35.0-39.9 without comorbidity)   . Prostate cancer ([place]) 05/2019  . Sleep apnea   . Urinary retention    Current Status: Since his last office visit, he is doing well with no complaints. He was referred to Urology because in increasing PSA levels and was recently diagnosed with Prostate Cancer in 05/2019. He is currently receiving Lupron injection treatments from Dr. Karsten Ro at [facility]. He continues to have occasional urinary retention, which he was prescribed Flomax. He states that he only has problems after long sedentary periods. He has discontinued medication because if itching. His anxiety is stable today is mild. He denies suicidal ideations, homicidal ideations, or auditory hallucinations. He denies visual changes, chest pain, cough, shortness of breath, heart palpitations, and falls. He has occasional headaches and dizziness with position changes. Denies severe headaches, confusion, seizures, double vision, and blurred vision, nausea and vomiting.  He denies fevers, chills, fatigue, recent infections, weight loss, and night sweats. He has not had any visual changes, and falls. No chest pain, heart palpitations, cough and shortness of breath reported. No reports of GI problems such as diarrhea, and constipation. He has no reports of blood in stools, dysuria and hematuria. He denies pain today.   Past Surgical History:  Procedure Laterality Date  . COLONOSCOPY N/A 10/14/2017   Procedure: COLONOSCOPY;  Surgeon: Gatha Mayer, MD;  Location: [facility] ENDOSCOPY;  Service: Endoscopy;  Laterality: N/A;    Family History  Problem Relation Age of Onset  . Hypertension Other   . Hypertension Brother   . Colon cancer Neg Hx     Social History   Socioeconomic History  . Marital status: Married    Spouse name: Not on file  . Number of children: Not on file  . Years of education: Not on file  . Highest education level: Not on file  Occupational History  . Not on file  Social Needs  . Financial resource strain: Not on file  . Food insecurity    Worry: Not on file    Inability: Not on file  . Transportation needs    Medical: Not on file    Non-medical: Not on file  Tobacco Use  . Smoking status: Never Smoker  . Smokeless tobacco: Never Used  Substance and Sexual Activity  . Alcohol use: Yes    Comment: occ  . Drug use: No  . Sexual activity: Not on file  Lifestyle  . Physical activity    Days per week: Not on file    Minutes per session: Not on file  . Stress: Not on file  Relationships  . Social Herbalist on phone: Not on file    Gets together: Not on file    Attends religious service: Not on file    Active member of club or organization: Not on file    Attends meetings of clubs or organizations: Not on file    Relationship status: Not on  file  . Intimate partner violence    Fear of current or ex partner: Not on file    Emotionally abused: Not on file    Physically abused: Not on file    Forced sexual activity: Not on file  Other Topics Concern  . Not on file  Social History Narrative  . Not on file    Outpatient Medications Prior to Visit  Medication Sig Dispense Refill  . aspirin EC 81 MG tablet Take 81 mg by mouth daily.    . Calcium Carbonate-Vitamin D (CALCIUM 500/D) 500-125 MG-UNIT TABS Take by mouth.    . hydrochlorothiazide (HYDRODIURIL) 25 MG tablet Take 0.5 tablets (12.5 mg total) by mouth daily. 30 tablet 0  . lisinopril (PRINIVIL,ZESTRIL) 20 MG tablet TAKE 1 TABLET  DAILY BY MOUTH. 90 tablet 3  . vitamin B-12 1000 MCG tablet Take 1 tablet (1,000 mcg total) by mouth daily. 30 tablet 0  . finasteride (PROSCAR) 5 MG tablet Take 1 tablet (5 mg total) by mouth daily. (Patient not taking: Reported on 07/15/2019) 30 tablet 3  . NON FORMULARY Take 3 tablets by mouth daily. NUGENIX    . tamsulosin (FLOMAX) 0.4 MG CAPS capsule Take 0.4 mg by mouth.     No facility-administered medications prior to visit.     No Known Allergies  ROS Review of Systems  Constitutional: Negative.   HENT: Negative.   Eyes: Negative.   Respiratory: Negative.   Cardiovascular: Negative.   Gastrointestinal: Negative.   Endocrine: Negative.   Genitourinary: Negative.   Musculoskeletal: Negative.   Skin: Negative.   Allergic/Immunologic: Negative.   Neurological: Positive for dizziness (occasional) and headaches (occasional ).  Hematological: Negative.   Psychiatric/Behavioral: Negative.     Objective:    Physical Exam  Constitutional: He is oriented to person, place, and time. He appears well-developed and well-nourished.  HENT:  Head: Normocephalic and atraumatic.  Eyes: Conjunctivae are normal.  Neck: Normal range of motion. Neck supple.  Cardiovascular: Normal rate, regular rhythm, normal heart sounds and intact distal pulses.  Pulmonary/Chest: Effort normal and breath sounds normal.  Abdominal: Soft. Bowel sounds are normal.  Musculoskeletal: Normal range of motion.  Neurological: He is alert and oriented to person, place, and time. He has normal reflexes.  Skin: Skin is warm and dry.  Psychiatric: He has a normal mood and affect. His behavior is normal. Judgment and thought content normal.    BP 140/78 (BP Location: Right Arm, Patient Position: Sitting, Cuff Size: Large)   Pulse 60   Temp 98.2 F (36.8 C) (Oral)   Ht 5\' 11"  (1.803 m)   Wt 270 lb (122.5 kg)   SpO2 99%   BMI 37.66 kg/m  Wt Readings from Last 3 Encounters:  07/15/19 270 lb (122.5 kg)   04/15/19 260 lb 12.8 oz (118.3 kg)  05/26/18 273 lb (123.8 kg)     There are no preventive care reminders to display for this patient.  There are no preventive care reminders to display for this patient.  Lab Results  Component Value Date   TSH 0.807 04/15/2019   Lab Results  Component Value Date   WBC 5.1 04/15/2019   HGB 14.0 04/15/2019   HCT 41.6 04/15/2019   MCV 82 04/15/2019   PLT 246 04/15/2019   Lab Results  Component Value Date   NA 142 04/15/2019   K 3.8 04/15/2019   CO2 20 04/15/2019   GLUCOSE 96 04/15/2019   BUN 14 04/15/2019   CREATININE 1.38 (  H) 04/15/2019   BILITOT 0.5 04/15/2019   ALKPHOS 67 04/15/2019   AST 16 04/15/2019   ALT 17 04/15/2019   PROT 7.6 04/15/2019   ALBUMIN 4.5 04/15/2019   CALCIUM 9.8 04/15/2019   ANIONGAP 6 10/14/2017   Lab Results  Component Value Date   CHOL 183 04/15/2019   Lab Results  Component Value Date   HDL 59 04/15/2019   Lab Results  Component Value Date   LDLCALC 112 (H) 04/15/2019   Lab Results  Component Value Date   TRIG 60 04/15/2019   Lab Results  Component Value Date   CHOLHDL 3.1 04/15/2019   Lab Results  Component Value Date   HGBA1C 5.0 10/28/2017   Assessment & Plan:   1. Prostate cancer [facility]) Recently diagnoses 05/2019. Stable. Continue injection treatments from Urology.   2. Benign prostatic hyperplasia with elevated prostate specific antigen (PSA)  3. Elevated PSA  4. Hypertension, essential The current medical regimen is effective; blood pressure  is stable at 140/78 today; continue present plan and medications as prescribed. He will continue to decrease high sodium intake, excessive alcohol intake, increase potassium intake, smoking cessation, and increase physical activity of at least 30 minutes of cardio activity daily. He will continue to follow Heart Healthy or DASH diet. - POCT urinalysis dipstick  5. Urinary retention Patient has discontinued Flomax at this time.   6. Follow  up He will follow up in 6 months.   No orders of the defined types were placed in this encounter.   Orders Placed This Encounter  Procedures  . POCT urinalysis dipstick    Referral Orders  No referral(s) requested today    Kathe Becton,  MSN, FNP-BC [facility] [address] [place],  [address] [phone]   Problem List Items Addressed This Visit      Cardiovascular and Mediastinum   Hypertension, essential   Relevant Orders   POCT urinalysis dipstick (Completed)     Genitourinary   Benign prostatic hyperplasia with elevated prostate specific antigen (PSA)   Relevant Medications   tamsulosin (FLOMAX) 0.4 MG CAPS capsule   Urinary retention     Other   Elevated PSA    Other Visit Diagnoses    Prostate cancer ([place])    -  Primary   Follow up          No orders of the defined types were placed in this encounter.   Follow-up: Return in about 6 months (around 01/15/2020).    Azzie Glatter, FNP

## 2019-08-05 ENCOUNTER — Other Ambulatory Visit: Payer: Self-pay | Admitting: Family Medicine

## 2019-08-05 DIAGNOSIS — I10 Essential (primary) hypertension: Secondary | ICD-10-CM

## 2019-08-05 MED FILL — HYDROCHLOROTHIAZIDE 25 MG T: 25 | 30 days supply | Qty: 15 | Fill #0

## 2019-08-10 MED FILL — LISINOPRIL 20 MG TABLET: 20 | 30 days supply | Qty: 30 | Fill #7

## 2019-09-01 ENCOUNTER — Telehealth: Payer: Self-pay

## 2019-09-02 ENCOUNTER — Other Ambulatory Visit: Payer: Self-pay

## 2019-09-02 DIAGNOSIS — I10 Essential (primary) hypertension: Secondary | ICD-10-CM

## 2019-09-02 MED ORDER — HYDROCHLOROTHIAZIDE 25 MG PO TABS: 12.5000 mg | ORAL_TABLET | Freq: Every day | ORAL | 1 refills | Status: DC

## 2019-09-02 MED FILL — HYDROCHLOROTHIAZIDE 25 MG T: 25 | 30 days supply | Qty: 15 | Fill #0

## 2019-09-02 NOTE — Telephone Encounter (Signed)
Sent refill to the pharmacy  

## 2019-09-12 MED FILL — LISINOPRIL 20 MG TABLET: 20 | 30 days supply | Qty: 30 | Fill #8

## 2019-10-12 ENCOUNTER — Other Ambulatory Visit: Payer: Self-pay | Admitting: Urology

## 2019-10-12 ENCOUNTER — Other Ambulatory Visit (HOSPITAL_COMMUNITY): Payer: Self-pay | Admitting: Urology

## 2019-10-12 DIAGNOSIS — C61 Malignant neoplasm of prostate: Secondary | ICD-10-CM

## 2019-10-14 MED FILL — LISINOPRIL 20 MG TABLET: 20 | 30 days supply | Qty: 30 | Fill #9

## 2019-10-18 MED FILL — HYDROCHLOROTHIAZIDE 25 MG T: 25 | 30 days supply | Qty: 15 | Fill #0

## 2019-11-15 ENCOUNTER — Other Ambulatory Visit: Payer: Self-pay

## 2019-11-15 MED ORDER — LISINOPRIL 20 MG PO TABS: 20.0000 mg | ORAL_TABLET | Freq: Every day | ORAL | 3 refills | Status: DC

## 2019-11-16 ENCOUNTER — Telehealth: Payer: Self-pay | Admitting: Internal Medicine

## 2019-11-16 NOTE — Telephone Encounter (Signed)
error 

## 2019-12-20 ENCOUNTER — Other Ambulatory Visit: Payer: Self-pay

## 2019-12-20 ENCOUNTER — Telehealth: Payer: Self-pay | Admitting: Family Medicine

## 2019-12-20 ENCOUNTER — Encounter: Payer: Self-pay | Admitting: Family Medicine

## 2019-12-20 ENCOUNTER — Ambulatory Visit (INDEPENDENT_AMBULATORY_CARE_PROVIDER_SITE_OTHER): Payer: BC Managed Care – PPO | Admitting: Family Medicine

## 2019-12-20 VITALS — BP 132/65 | HR 69 | Temp 97.8°F | Ht 71.0 in | Wt 269.0 lb

## 2019-12-20 DIAGNOSIS — R972 Elevated prostate specific antigen [PSA]: Secondary | ICD-10-CM | POA: Diagnosis not present

## 2019-12-20 DIAGNOSIS — N400 Enlarged prostate without lower urinary tract symptoms: Secondary | ICD-10-CM | POA: Diagnosis not present

## 2019-12-20 DIAGNOSIS — I10 Essential (primary) hypertension: Secondary | ICD-10-CM

## 2019-12-20 DIAGNOSIS — T148XXA Other injury of unspecified body region, initial encounter: Secondary | ICD-10-CM | POA: Diagnosis not present

## 2019-12-20 DIAGNOSIS — Z09 Encounter for follow-up examination after completed treatment for conditions other than malignant neoplasm: Secondary | ICD-10-CM

## 2019-12-20 DIAGNOSIS — C61 Malignant neoplasm of prostate: Secondary | ICD-10-CM

## 2019-12-20 DIAGNOSIS — R339 Retention of urine, unspecified: Secondary | ICD-10-CM

## 2019-12-20 LAB — POCT URINALYSIS DIPSTICK
Bilirubin, UA: NEGATIVE
Blood, UA: NEGATIVE
Glucose, UA: NEGATIVE
Ketones, UA: NEGATIVE
Leukocytes, UA: NEGATIVE
Nitrite, UA: NEGATIVE
Protein, UA: NEGATIVE
Spec Grav, UA: 1.025 (ref 1.010–1.025)
Urobilinogen, UA: 0.2 E.U./dL
pH, UA: 5.5 (ref 5.0–8.0)

## 2019-12-20 MED ORDER — IBUPROFEN 800 MG PO TABS: 800.0000 mg | ORAL_TABLET | Freq: Three times a day (TID) | ORAL | 3 refills | Status: DC | PRN

## 2019-12-20 MED FILL — IBUPROFEN 800 MG TABLET: 800 | 15 days supply | Qty: 45 | Fill #0

## 2019-12-20 NOTE — Telephone Encounter (Signed)
error 

## 2019-12-20 NOTE — Progress Notes (Signed)
Patient [facility]   Established Patient Office Visit  Subjective:  Patient ID: Craig Collins, male    DOB: 07-03-1966  Age: 53 y.o. MRN: [id number]  CC:  Chief Complaint  Patient presents with  . Follow-up    HTN  . Shoulder Pain    RIGHT Shoulder pain 2 weeks   . Neck Pain    RIGHT side neck    HPI Craig Collins is a 53 year old male who presents for Follow Up today.   Past Medical History:  Diagnosis Date  . Benign prostate hyperplasia   . Elevated PSA   . Hypertension   . Obesity (BMI 35.0-39.9 without comorbidity)   . Prostate cancer ([place]) 05/2019  . Sleep apnea   . Urinary retention    Current Status: Since his last office visit, he has c/o of right shoulder/arm/neck pain X 3 weeks now. He has not been able to work out, and is now having problems with preforming tasks at work. He continues to follow up with Urology for Prostate Cancer. He denies fevers, chills, fatigue, recent infections, weight loss, and night sweats. He has not had any headaches, visual changes, dizziness, and falls. No chest pain, heart palpitations, cough and shortness of breath reported. No reports of GI problems such as nausea, vomiting, diarrhea, and constipation. He has no reports of blood in stools, dysuria and hematuria. No depression or anxiety, and denies suicidal ideations, homicidal ideations, or auditory hallucinations. He denies pain today.   Past Surgical History:  Procedure Laterality Date  . COLONOSCOPY N/A 10/14/2017   Procedure: COLONOSCOPY;  Surgeon: Gatha Mayer, MD;  Location: [facility] ENDOSCOPY;  Service: Endoscopy;  Laterality: N/A;    Family History  Problem Relation Age of Onset  . Hypertension Other   . Hypertension Brother   . Colon cancer Neg Hx     Social History   Socioeconomic History  . Marital status: Married    Spouse name: Not on file  . Number of children: Not on file  . Years of education: Not on file  .  Highest education level: Not on file  Occupational History  . Not on file  Tobacco Use  . Smoking status: Never Smoker  . Smokeless tobacco: Never Used  Substance and Sexual Activity  . Alcohol use: Yes    Comment: occ  . Drug use: No  . Sexual activity: Yes    Birth control/protection: Condom  Other Topics Concern  . Not on file  Social History Narrative  . Not on file   Social Determinants of Health   Financial Resource Strain:   . Difficulty of Paying Living Expenses: Not on file  Food Insecurity:   . Worried About Charity fundraiser in the Last Year: Not on file  . Ran Out of Food in the Last Year: Not on file  Transportation Needs:   . Lack of Transportation (Medical): Not on file  . Lack of Transportation (Non-Medical): Not on file  Physical Activity:   . Days of Exercise per Week: Not on file  . Minutes of Exercise per Session: Not on file  Stress:   . Feeling of Stress : Not on file  Social Connections:   . Frequency of Communication with Friends and Family: Not on file  . Frequency of Social Gatherings with Friends and Family: Not on file  . Attends Religious Services: Not on file  . Active Member of Clubs or Organizations:  Not on file  . Attends Archivist Meetings: Not on file  . Marital Status: Not on file  Intimate Partner Violence:   . Fear of Current or Ex-Partner: Not on file  . Emotionally Abused: Not on file  . Physically Abused: Not on file  . Sexually Abused: Not on file    Outpatient Medications Prior to Visit  Medication Sig Dispense Refill  . aspirin EC 81 MG tablet Take 81 mg by mouth daily.    . Calcium Carbonate-Vitamin D (CALCIUM 500/D) 500-125 MG-UNIT TABS Take by mouth.    . hydrochlorothiazide (HYDRODIURIL) 25 MG tablet Take 0.5 tablets (12.5 mg total) by mouth daily. 30 tablet 1  . lisinopril (ZESTRIL) 20 MG tablet Take 1 tablet (20 mg total) by mouth daily. 90 tablet 3  . tamsulosin (FLOMAX) 0.4 MG CAPS capsule Take 0.4 mg  by mouth.    . vitamin B-12 1000 MCG tablet Take 1 tablet (1,000 mcg total) by mouth daily. 30 tablet 0  . finasteride (PROSCAR) 5 MG tablet Take 1 tablet (5 mg total) by mouth daily. (Patient not taking: Reported on 07/15/2019) 30 tablet 3  . NON FORMULARY Take 3 tablets by mouth daily. NUGENIX     No facility-administered medications prior to visit.    No Known Allergies  ROS Review of Systems  Constitutional: Negative.   HENT: Negative.   Eyes: Negative.   Respiratory: Negative.   Cardiovascular: Negative.   Gastrointestinal: Negative.   Endocrine: Negative.   Genitourinary:       Urinary retention  Musculoskeletal: Negative.   Skin: Negative.   Allergic/Immunologic: Negative.   Neurological: Positive for dizziness (occasional ) and headaches (occasional ).  Hematological: Negative.   Psychiatric/Behavioral: Negative.       Objective:    Physical Exam  Constitutional: He is oriented to person, place, and time. He appears well-developed and well-nourished.  HENT:  Head: Normocephalic and atraumatic.  Eyes: Conjunctivae are normal.  Cardiovascular: Normal rate, regular rhythm, normal heart sounds and intact distal pulses.  Pulmonary/Chest: Effort normal and breath sounds normal.  Abdominal: Soft. Bowel sounds are normal. He exhibits distension (occasional.).  Genitourinary:    Genitourinary Comments: Prostate cancer   Musculoskeletal:        General: Normal range of motion.     Cervical back: Normal range of motion and neck supple.  Neurological: He is alert and oriented to person, place, and time. He has normal reflexes.  Skin: Skin is warm and dry.  Psychiatric: He has a normal mood and affect. His behavior is normal. Judgment and thought content normal.  Nursing note and vitals reviewed.   BP 132/65   Pulse 69   Temp 97.8 F (36.6 C) (Oral)   Ht 5\' 11"  (1.803 m)   Wt 269 lb (122 kg)   SpO2 99%   BMI 37.52 kg/m  Wt Readings from Last 3 Encounters:    12/20/19 269 lb (122 kg)  07/15/19 270 lb (122.5 kg)  04/15/19 260 lb 12.8 oz (118.3 kg)     Health Maintenance Due  Topic Date Due  . INFLUENZA VACCINE  07/23/2019    There are no preventive care reminders to display for this patient.  Lab Results  Component Value Date   TSH 0.807 04/15/2019   Lab Results  Component Value Date   WBC 5.1 04/15/2019   HGB 14.0 04/15/2019   HCT 41.6 04/15/2019   MCV 82 04/15/2019   PLT 246 04/15/2019   Lab Results  Component Value Date   NA 142 04/15/2019   K 3.8 04/15/2019   CO2 20 04/15/2019   GLUCOSE 96 04/15/2019   BUN 14 04/15/2019   CREATININE 1.38 (H) 04/15/2019   BILITOT 0.5 04/15/2019   ALKPHOS 67 04/15/2019   AST 16 04/15/2019   ALT 17 04/15/2019   PROT 7.6 04/15/2019   ALBUMIN 4.5 04/15/2019   CALCIUM 9.8 04/15/2019   ANIONGAP 6 10/14/2017   Lab Results  Component Value Date   CHOL 183 04/15/2019   Lab Results  Component Value Date   HDL 59 04/15/2019   Lab Results  Component Value Date   LDLCALC 112 (H) 04/15/2019   Lab Results  Component Value Date   TRIG 60 04/15/2019   Lab Results  Component Value Date   CHOLHDL 3.1 04/15/2019   Lab Results  Component Value Date   HGBA1C 5.0 10/28/2017      Assessment & Plan:   1. Muscle strain We will initiate pain medication today. Discussed and printed RICE interventions for pain relief also. 24 excuse written today. Patient will report to office if symptoms do not improve or worsen.  - ibuprofen (ADVIL) 800 MG tablet; Take 1 tablet (800 mg total) by mouth every 8 (eight) hours as needed.  Dispense: 45 tablet; Refill: 3  2. Prostate cancer ([place])  3. Elevated PSA  4. Benign prostatic hyperplasia with elevated prostate specific antigen (PSA) He will continue Lupron injections, per Urology.   5. Urinary retention  6. Hypertension, essential The current medical regimen is effective; blood pressure is stable at 132/65 today; continue present  plan and medications as prescribed. He will continue to take medications as prescribed, to decrease high sodium intake, excessive alcohol intake, increase potassium intake, smoking cessation, and increase physical activity of at least 30 minutes of cardio activity daily. He will continue to follow Heart Healthy or DASH diet. - Urinalysis Dipstick  7. Follow up He will follow up in 6 months.   Meds ordered this encounter  Medications  . ibuprofen (ADVIL) 800 MG tablet    Sig: Take 1 tablet (800 mg total) by mouth every 8 (eight) hours as needed.    Dispense:  45 tablet    Refill:  3    Orders Placed This Encounter  Procedures  . Urinalysis Dipstick    Referral Orders  No referral(s) requested today   Kathe Becton,  MSN, FNP-BC [place], [place] [address] [phone] fax   Problem List Items Addressed This Visit      Cardiovascular and Mediastinum   Hypertension, essential   Relevant Orders   Urinalysis Dipstick (Completed)     Genitourinary   Benign prostatic hyperplasia with elevated prostate specific antigen (PSA)   Urinary retention     Other   Elevated PSA    Other Visit Diagnoses    Muscle strain    -  Primary   Relevant Medications   ibuprofen (ADVIL) 800 MG tablet   Prostate cancer ([place])       Follow up          Meds ordered this encounter  Medications  . ibuprofen (ADVIL) 800 MG tablet    Sig: Take 1 tablet (800 mg total) by mouth every 8 (eight) hours as needed.    Dispense:  45 tablet    Refill:  3    Follow-up: Return in about 6 months (around 06/19/2020).  Azzie Glatter, FNP

## 2019-12-21 ENCOUNTER — Other Ambulatory Visit: Payer: Self-pay | Admitting: Family Medicine

## 2019-12-21 DIAGNOSIS — C61 Malignant neoplasm of prostate: Secondary | ICD-10-CM | POA: Insufficient documentation

## 2019-12-21 DIAGNOSIS — R972 Elevated prostate specific antigen [PSA]: Secondary | ICD-10-CM

## 2019-12-21 DIAGNOSIS — R339 Retention of urine, unspecified: Secondary | ICD-10-CM

## 2019-12-21 DIAGNOSIS — T148XXA Other injury of unspecified body region, initial encounter: Secondary | ICD-10-CM | POA: Insufficient documentation

## 2019-12-21 DIAGNOSIS — N400 Enlarged prostate without lower urinary tract symptoms: Secondary | ICD-10-CM

## 2019-12-21 MED ORDER — TAMSULOSIN HCL 0.4 MG PO CAPS: 0.4000 mg | ORAL_CAPSULE | Freq: Every day | ORAL | 6 refills | Status: DC

## 2019-12-21 MED FILL — TAMSULOSIN HCL 0.4 MG CAP: 0.4 | 30 days supply | Qty: 30 | Fill #0

## 2019-12-21 NOTE — Patient Instructions (Signed)
RICE Therapy for Routine Care of Injuries Many injuries can be cared for with rest, ice, compression, and elevation (RICE therapy). This includes:  Resting the injured part.  Putting ice on the injury.  Putting pressure (compression) on the injury.  Raising the injured part (elevation). Using RICE therapy can help to lessen pain and swelling. Supplies needed:  Ice.  Plastic bag.  Towel.  Elastic bandage.  Pillow or pillows to raise (elevate) your injured body part. How to care for your injury with RICE therapy Rest Limit your normal activities, and try not to use the injured part of your body. You can go back to your normal activities when your doctor says it is okay to do them and you feel okay. Ask your doctor if you should do exercises to help your injury get better. Ice Put ice on the injured area. Do not put ice on your bare skin.  Put ice in a plastic bag.  Place a towel between your skin and the bag.  Leave the ice on for 20 minutes, 2-3 times a day. Use ice on as many days as told by your doctor.  Compression Compression means putting pressure on the injured area. This can be done with an elastic bandage. If an elastic bandage has been put on your injury:  Do not wrap the bandage too tight. Wrap the bandage more loosely if part of your body away from the bandage is blue, swollen, cold, painful, or loses feeling (gets numb).  Take off the bandage and put it on again. Do this every 3-4 hours or as told by your doctor.  See your doctor if the bandage seems to make your problems worse.  Elevation Elevation means keeping the injured area raised. If you can, raise the injured area above your heart or the center of your chest. Contact a doctor if:  You keep having pain and swelling.  Your symptoms get worse. Get help right away if:  You have sudden bad pain at your injury or lower than your injury.  You have redness or more swelling around your injury.  You  have tingling or numbness at your injury or lower than your injury, and it does not go away when you take off the bandage. Summary  Many injuries can be cared for using rest, ice, compression, and elevation (RICE therapy).  You can go back to your normal activities when you feel okay and your doctor says it is okay.  Put ice on the injured area as told by your doctor.  Get help if your symptoms get worse or if you keep having pain and swelling. This information is not intended to replace advice given to you by your health care provider. Make sure you discuss any questions you have with your health care provider. Document Released: 05/26/2008 Document Revised: 08/28/2017 Document Reviewed: 08/28/2017 Elsevier Patient Education  2020 [place]. Muscle Strain A muscle strain is an injury that happens when a muscle is stretched longer than normal. This can happen during a fall, sports, or lifting. This can tear some muscle fibers. Usually, recovery from muscle strain takes 1-2 weeks. Complete healing normally takes 5-6 weeks. This condition is first treated with PRICE therapy. This involves:  Protecting your muscle from being injured again.  Resting your injured muscle.  Icing your injured muscle.  Applying pressure (compression) to your injured muscle. This may be done with a splint or elastic bandage.  Raising (elevating) your injured muscle. Your doctor may also recommend  medicine for pain. Follow these instructions at home: If you have a splint:  Wear the splint as told by your doctor. Take it off only as told by your doctor.  Loosen the splint if your fingers or toes tingle, get numb, or turn cold and blue.  Keep the splint clean.  If the splint is not waterproof: ? Do not let it get wet. ? Cover it with a watertight covering when you take a bath or a shower. Managing pain, stiffness, and swelling   If directed, put ice on your injured area. ? If you have a removable  splint, take it off as told by your doctor. ? Put ice in a plastic bag. ? Place a towel between your skin and the bag. ? Leave the ice on for 20 minutes, 2-3 times a day.  Move your fingers or toes often. This helps to avoid stiffness and lessen swelling.  Raise your injured area above the level of your heart while you are sitting or lying down.  Wear an elastic bandage as told by your doctor. Make sure it is not too tight. General instructions  Take over-the-counter and prescription medicines only as told by your doctor.  Limit your activity. Rest your injured muscle as told by your doctor. Your doctor may say that gentle movements are okay.  If physical therapy was prescribed, do exercises as told by your doctor.  Do not put pressure on any part of the splint until it is fully hardened. This may take many hours.  Do not use any products that contain nicotine or tobacco, such as cigarettes and e-cigarettes. These can delay bone healing. If you need help quitting, ask your doctor.  Warm up before you exercise. This helps to prevent more muscle strains.  Ask your doctor when it is safe to drive if you have a splint.  Keep all follow-up visits as told by your doctor. This is important. Contact a doctor if:  You have more pain or swelling in your injured area. Get help right away if:  You have any of these problems in your injured area: ? You have numbness. ? You have tingling. ? You lose a lot of strength. Summary  A muscle strain is an injury that happens when a muscle is stretched longer than normal.  This condition is first treated with PRICE therapy. This includes protecting, resting, icing, adding pressure, and raising your injury.  Limit your activity. Rest your injured muscle as told by your doctor. Your doctor may say that gentle movements are okay.  Warm up before you exercise. This helps to prevent more muscle strains. This information is not intended to replace  advice given to you by your health care provider. Make sure you discuss any questions you have with your health care provider. Document Released: 09/16/2008 Document Revised: 02/03/2019 Document Reviewed: 01/14/2017 Elsevier Patient Education  2020 [employer].

## 2019-12-25 ENCOUNTER — Other Ambulatory Visit: Payer: Self-pay | Admitting: Family Medicine

## 2019-12-26 MED FILL — HYDROCHLOROTHIAZIDE 25 MG T: 25 | 30 days supply | Qty: 15 | Fill #2

## 2020-01-16 ENCOUNTER — Ambulatory Visit: Payer: BC Managed Care – PPO | Admitting: Family Medicine

## 2020-01-17 ENCOUNTER — Encounter (HOSPITAL_COMMUNITY)
Admission: RE | Admit: 2020-01-17 | Discharge: 2020-01-17 | Disposition: A | Discharge status: Home / Self Care | Payer: BC Managed Care – PPO | Source: Ambulatory Visit | Attending: Urology | Admitting: Urology

## 2020-01-17 ENCOUNTER — Other Ambulatory Visit: Payer: Self-pay

## 2020-01-17 DIAGNOSIS — C61 Malignant neoplasm of prostate: Secondary | ICD-10-CM | POA: Diagnosis not present

## 2020-01-17 MED ORDER — TECHNETIUM TC 99M MEDRONATE IV KIT
20.0000 | PACK | Freq: Once | INTRAVENOUS | Status: AC | PRN
  Administered 2020-01-17: 21.8 via INTRAVENOUS

## 2020-01-23 MED FILL — TAMSULOSIN HCL 0.4 MG CAP: 0.4 | 30 days supply | Qty: 30 | Fill #1

## 2020-01-23 MED FILL — IBUPROFEN 800 MG TABLET: 800 | 15 days supply | Qty: 45 | Fill #1

## 2020-01-23 MED FILL — HYDROCHLOROTHIAZIDE 25 MG T: 25 | 30 days supply | Qty: 15 | Fill #3

## 2020-01-25 DIAGNOSIS — C61 Malignant neoplasm of prostate: Secondary | ICD-10-CM | POA: Diagnosis not present

## 2020-01-25 DIAGNOSIS — R232 Flushing: Secondary | ICD-10-CM | POA: Diagnosis not present

## 2020-01-25 DIAGNOSIS — C778 Secondary and unspecified malignant neoplasm of lymph nodes of multiple regions: Secondary | ICD-10-CM | POA: Diagnosis not present

## 2020-02-23 MED FILL — TAMSULOSIN HCL 0.4 MG CAP: 0.4 | 30 days supply | Qty: 30 | Fill #2

## 2020-02-23 MED FILL — LISINOPRIL 20 MG TABLET: 20 | 90 days supply | Qty: 90 | Fill #1

## 2020-02-23 MED FILL — IBUPROFEN 800 MG TABLET: 800 | 15 days supply | Qty: 45 | Fill #2

## 2020-02-23 MED FILL — HYDROCHLOROTHIAZIDE 25 MG T: 25 | 30 days supply | Qty: 15 | Fill #0

## 2020-03-26 MED FILL — HYDROCHLOROTHIAZIDE 25 MG T: 25 | 30 days supply | Qty: 15 | Fill #1

## 2020-03-26 MED FILL — TAMSULOSIN HCL 0.4 MG CAP: 0.4 | 30 days supply | Qty: 30 | Fill #3

## 2020-05-02 ENCOUNTER — Other Ambulatory Visit: Payer: Self-pay | Admitting: Family Medicine

## 2020-05-02 DIAGNOSIS — I10 Essential (primary) hypertension: Secondary | ICD-10-CM

## 2020-06-04 MED FILL — HYDROCHLOROTHIAZIDE 25 MG T: 25 | 30 days supply | Qty: 15 | Fill #1

## 2020-06-04 MED FILL — TAMSULOSIN HCL 0.4 MG CAP: 0.4 | 30 days supply | Qty: 30 | Fill #5

## 2020-06-04 MED FILL — LISINOPRIL 20 MG TABLET: 20 | 90 days supply | Qty: 90 | Fill #2

## 2020-06-04 MED FILL — IBUPROFEN 800 MG TABLET: 800 | 15 days supply | Qty: 45 | Fill #3

## 2020-06-18 ENCOUNTER — Other Ambulatory Visit: Payer: Self-pay

## 2020-06-18 ENCOUNTER — Ambulatory Visit (INDEPENDENT_AMBULATORY_CARE_PROVIDER_SITE_OTHER): Payer: BC Managed Care – PPO | Admitting: Family Medicine

## 2020-06-18 ENCOUNTER — Encounter: Payer: Self-pay | Admitting: Family Medicine

## 2020-06-18 VITALS — BP 145/103 | HR 78 | Temp 97.5°F | Ht 71.0 in | Wt 283.1 lb

## 2020-06-18 DIAGNOSIS — C61 Malignant neoplasm of prostate: Secondary | ICD-10-CM | POA: Diagnosis not present

## 2020-06-18 DIAGNOSIS — R972 Elevated prostate specific antigen [PSA]: Secondary | ICD-10-CM

## 2020-06-18 DIAGNOSIS — R339 Retention of urine, unspecified: Secondary | ICD-10-CM

## 2020-06-18 DIAGNOSIS — N529 Male erectile dysfunction, unspecified: Secondary | ICD-10-CM | POA: Diagnosis not present

## 2020-06-18 DIAGNOSIS — Z09 Encounter for follow-up examination after completed treatment for conditions other than malignant neoplasm: Secondary | ICD-10-CM

## 2020-06-18 DIAGNOSIS — I10 Essential (primary) hypertension: Secondary | ICD-10-CM | POA: Diagnosis not present

## 2020-06-18 DIAGNOSIS — N400 Enlarged prostate without lower urinary tract symptoms: Secondary | ICD-10-CM | POA: Diagnosis not present

## 2020-06-18 DIAGNOSIS — I160 Hypertensive urgency: Secondary | ICD-10-CM

## 2020-06-18 LAB — POCT URINALYSIS DIPSTICK
Bilirubin, UA: NEGATIVE
Blood, UA: NEGATIVE
Glucose, UA: NEGATIVE
Ketones, UA: NEGATIVE
Leukocytes, UA: NEGATIVE
Nitrite, UA: NEGATIVE
Protein, UA: NEGATIVE
Spec Grav, UA: 1.025 (ref 1.010–1.025)
Urobilinogen, UA: 0.2 E.U./dL
pH, UA: 5.5 (ref 5.0–8.0)

## 2020-06-18 MED ORDER — SILDENAFIL CITRATE 100 MG PO TABS: 100.0000 mg | ORAL_TABLET | Freq: Every day | ORAL | 2 refills | Status: DC | PRN

## 2020-06-18 MED ORDER — CLONIDINE HCL 0.1 MG PO TABS
0.1000 mg | ORAL_TABLET | Freq: Once | ORAL | Status: AC
  Administered 2020-06-18: 0.2 mg via ORAL

## 2020-06-18 NOTE — Patient Instructions (Signed)
Sildenafil tablets (Erectile Dysfunction) What is this medicine? SILDENAFIL (sil DEN a fil) is used to treat erection problems in men. This medicine may be used for other purposes; ask your health care provider or pharmacist if you have questions. COMMON BRAND NAME(S): Viagra What should I tell my health care provider before I take this medicine? They need to know if you have any of these conditions:  bleeding disorders  eye or vision problems, including a rare inherited eye disease called retinitis pigmentosa  anatomical deformation of the penis, Peyronie's disease, or history of priapism (painful and prolonged erection)  heart disease, angina, a history of heart attack, irregular heart beats, or other heart problems  high or low blood pressure  history of blood diseases, like sickle cell anemia or leukemia  history of stomach bleeding  kidney disease  liver disease  stroke  an unusual or allergic reaction to sildenafil, other medicines, foods, dyes, or preservatives  pregnant or trying to get pregnant  breast-feeding How should I use this medicine? Take this medicine by mouth with a glass of water. Follow the directions on the prescription label. The dose is usually taken 1 hour before sexual activity. You should not take the dose more than once per day. Do not take your medicine more often than directed. Talk to your pediatrician regarding the use of this medicine in children. This medicine is not used in children for this condition. Overdosage: If you think you have taken too much of this medicine contact a poison control center or emergency room at once. NOTE: This medicine is only for you. Do not share this medicine with others. What if I miss a dose? This does not apply. Do not take double or extra doses. What may interact with this medicine? Do not take this medicine with any of the following medications:  cisapride  nitrates like amyl nitrite, isosorbide  dinitrate, isosorbide mononitrate, nitroglycerin  riociguat This medicine may also interact with the following medications:  antiviral medicines for HIV or AIDS  bosentan  certain medicines for benign prostatic hyperplasia (BPH)  certain medicines for blood pressure  certain medicines for fungal infections like ketoconazole and itraconazole  cimetidine  erythromycin  rifampin This list may not describe all possible interactions. Give your health care provider a list of all the medicines, herbs, non-prescription drugs, or dietary supplements you use. Also tell them if you smoke, drink alcohol, or use illegal drugs. Some items may interact with your medicine. What should I watch for while using this medicine? If you notice any changes in your vision while taking this drug, call your doctor or health care professional as soon as possible. Stop using this medicine and call your health care provider right away if you have a loss of sight in one or both eyes. Contact your doctor or health care professional right away if you have an erection that lasts longer than 4 hours or if it becomes painful. This may be a sign of a serious problem and must be treated right away to prevent permanent damage. If you experience symptoms of nausea, dizziness, chest pain or arm pain upon initiation of sexual activity after taking this medicine, you should refrain from further activity and call your doctor or health care professional as soon as possible. Do not drink alcohol to excess (examples, 5 glasses of wine or 5 shots of whiskey) when taking this medicine. When taken in excess, alcohol can increase your chances of getting a headache or getting dizzy, increasing  your heart rate or lowering your blood pressure. Using this medicine does not protect you or your partner against HIV infection (the virus that causes AIDS) or other sexually transmitted diseases. What side effects may I notice from receiving this  medicine? Side effects that you should report to your doctor or health care professional as soon as possible:  allergic reactions like skin rash, itching or hives, swelling of the face, lips, or tongue  breathing problems  changes in hearing  changes in vision  chest pain  fast, irregular heartbeat  prolonged or painful erection  seizures Side effects that usually do not require medical attention (report to your doctor or health care professional if they continue or are bothersome):  back pain  dizziness  flushing  headache  indigestion  muscle aches  nausea  stuffy or runny nose This list may not describe all possible side effects. Call your doctor for medical advice about side effects. You may report side effects to FDA at 1-800-FDA-1088. Where should I keep my medicine? Keep out of reach of children. Store at room temperature between 15 and 30 degrees C (59 and 86 degrees F). Throw away any unused medicine after the expiration date. NOTE: This sheet is a summary. It may not cover all possible information. If you have questions about this medicine, talk to your doctor, pharmacist, or health care provider.  2020 Elsevier/Gold Standard (2015-11-21 12:00:25)  

## 2020-06-18 NOTE — Progress Notes (Signed)
Patient [facility]   Subjective:  Patient ID: Craig Collins, male    DOB: 04-Nov-1966  Age: 54 y.o. MRN: [id number]  CC:  Chief Complaint  Patient presents with  . Follow-up    6 month follow up; no concerns    HPI Craig Collins is a 54 year old male who presents for Follow Up today.   Patient Active Problem List   Diagnosis Date Noted  . Muscle strain 12/21/2019  . Prostate cancer ([place]) 12/21/2019  . Urinary retention 04/16/2019  . Benign prostatic hyperplasia with elevated prostate specific antigen (PSA) 04/16/2019  . Elevated PSA 04/16/2019  . Hypertension, essential 04/16/2019  . Benign neoplasm of rectum   . Diverticulosis of colon with hemorrhage   . Rectal bleeding 10/13/2017  . Acute blood loss anemia 10/13/2017  . Acute renal failure (ARF) ([place]) 10/13/2017  . Acute GI bleeding 10/13/2017  . Hematochezia     Past Medical History:  Diagnosis Date  . Benign prostate hyperplasia   . Elevated PSA   . Erectile dysfunction   . Hypertension   . Obesity (BMI 35.0-39.9 without comorbidity)   . Prostate cancer ([place]) 05/2019  . Sleep apnea   . Urinary retention      Current Status: Since his last office visit, he continues to have increased fatigue, and has not been successful with taking Sildenafil, which was prescribed for Erectile Dysfunction. His blood pressures are elevated today, which he has not been taking hypertensive medications as prescribed.  He denies visual changes, chest pain, cough, shortness of breath, heart palpitations, and falls. He has occasional headaches and dizziness with position changes. Denies severe headaches, confusion, seizures, double vision, and blurred vision, nausea and vomiting. He denies fevers, chills, fatigue, recent infections, weight loss, and night sweats. Denies GI problems such as diarrhea, and constipation. He has no reports of blood in stools, dysuria and hematuria. He has mild  anxiety r/t his healthcare status. He denies suicidal ideations, homicidal ideations, or auditory hallucinations. He is taking all medications as prescribed. He denies pain today.   Past Surgical History:  Procedure Laterality Date  . COLONOSCOPY N/A 10/14/2017   Procedure: COLONOSCOPY;  Surgeon: Gatha Mayer, MD;  Location: [facility];  Service: Endoscopy;  Laterality: N/A;    Family History  Problem Relation Age of Onset  . Hypertension Other   . Hypertension Brother   . Colon cancer Neg Hx     Social History   Socioeconomic History  . Marital status: Married    Spouse name: Not on file  . Number of children: Not on file  . Years of education: Not on file  . Highest education level: Not on file  Occupational History  . Not on file  Tobacco Use  . Smoking status: Never Smoker  . Smokeless tobacco: Never Used  Vaping Use  . Vaping Use: Never used  Substance and Sexual Activity  . Alcohol use: Yes    Comment: occ  . Drug use: No  . Sexual activity: Yes    Birth control/protection: Condom  Other Topics Concern  . Not on file  Social History Narrative  . Not on file   Social Determinants of Health   Financial Resource Strain:   . Difficulty of Paying Living Expenses:   Food Insecurity:   . Worried About Charity fundraiser in the Last Year:   . [place] in the Last Year:  Transportation Needs:   . Film/video editor (Medical):   Marland Kitchen Lack of Transportation (Non-Medical):   Physical Activity:   . Days of Exercise per Week:   . Minutes of Exercise per Session:   Stress:   . Feeling of Stress :   Social Connections:   . Frequency of Communication with Friends and Family:   . Frequency of Social Gatherings with Friends and Family:   . Attends Religious Services:   . Active Member of Clubs or Organizations:   . Attends Archivist Meetings:   Marland Kitchen Marital Status:   Intimate Partner Violence:   . Fear of Current or Ex-Partner:   .  Emotionally Abused:   Marland Kitchen Physically Abused:   . Sexually Abused:     Outpatient Medications Prior to Visit  Medication Sig Dispense Refill  . aspirin EC 81 MG tablet Take 81 mg by mouth daily.    . Calcium Carbonate-Vitamin D (CALCIUM 500/D) 500-125 MG-UNIT TABS Take by mouth.    Marland Kitchen ibuprofen (ADVIL) 800 MG tablet Take 1 tablet (800 mg total) by mouth every 8 (eight) hours as needed. 45 tablet 3  . tamsulosin (FLOMAX) 0.4 MG CAPS capsule Take 1 capsule (0.4 mg total) by mouth daily. 30 capsule 6  . vitamin B-12 1000 MCG tablet Take 1 tablet (1,000 mcg total) by mouth daily. 30 tablet 0  . finasteride (PROSCAR) 5 MG tablet Take 1 tablet (5 mg total) by mouth daily. 30 tablet 3  . hydrochlorothiazide (HYDRODIURIL) 25 MG tablet TAKE 1/2 TABLET (12.5 MG TOTAL) BY MOUTH DAILY. 30 tablet 1  . lisinopril (ZESTRIL) 20 MG tablet Take 1 tablet (20 mg total) by mouth daily. 90 tablet 3  . NON FORMULARY Take 3 tablets by mouth daily. NUGENIX     No facility-administered medications prior to visit.    No Known Allergies  ROS Review of Systems  Constitutional: Negative.   HENT: Negative.   Eyes: Negative.   Respiratory: Negative.   Cardiovascular: Negative.   Gastrointestinal: Negative.   Endocrine: Negative.   Genitourinary: Negative.   Musculoskeletal: Positive for arthralgias (generalized joint pain).  Skin: Negative.   Allergic/Immunologic: Negative.   Neurological: Positive for dizziness (occasional ) and headaches (occasional ).  Hematological: Negative.   Psychiatric/Behavioral: Negative.       Objective:    Physical Exam Vitals and nursing note reviewed.  Constitutional:      Appearance: Normal appearance.  HENT:     Head: Normocephalic and atraumatic.     Nose: Nose normal.     Mouth/Throat:     Mouth: Mucous membranes are moist.     Pharynx: Oropharynx is clear.  Cardiovascular:     Rate and Rhythm: Normal rate and regular rhythm.     Pulses: Normal pulses.     Heart  sounds: Normal heart sounds.  Pulmonary:     Effort: Pulmonary effort is normal.     Breath sounds: Normal breath sounds.  Abdominal:     General: Bowel sounds are normal. There is distension (obese).     Palpations: Abdomen is soft.  Musculoskeletal:        General: Normal range of motion.     Cervical back: Normal range of motion and neck supple.  Skin:    General: Skin is warm and dry.  Neurological:     General: No focal deficit present.     Mental Status: He is alert and oriented to person, place, and time.  Psychiatric:  Mood and Affect: Mood normal.        Behavior: Behavior normal.        Thought Content: Thought content normal.        Judgment: Judgment normal.     BP (!) 145/103   Pulse 78   Temp (!) 97.5 F (36.4 C)   Ht 5\' 11"  (1.803 m)   Wt 283 lb 0.8 oz (128.4 kg)   SpO2 100%   BMI 39.48 kg/m  Wt Readings from Last 3 Encounters:  06/18/20 283 lb 0.8 oz (128.4 kg)  12/20/19 269 lb (122 kg)  07/15/19 270 lb (122.5 kg)     Health Maintenance Due  Topic Date Due  . Hepatitis C Screening  Never done  . COVID-19 Vaccine (1) Never done    There are no preventive care reminders to display for this patient.  Lab Results  Component Value Date   TSH 1.580 06/18/2020   Lab Results  Component Value Date   WBC 4.5 06/18/2020   HGB 12.5 (L) 06/18/2020   HCT 36.7 (L) 06/18/2020   MCV 83 06/18/2020   PLT 242 06/18/2020   Lab Results  Component Value Date   NA 140 06/18/2020   K 4.2 06/18/2020   CO2 21 06/18/2020   GLUCOSE 93 06/18/2020   BUN 23 06/18/2020   CREATININE 1.40 (H) 06/18/2020   BILITOT 0.2 06/18/2020   ALKPHOS 76 06/18/2020   AST 11 06/18/2020   ALT 10 06/18/2020   PROT 7.4 06/18/2020   ALBUMIN 4.3 06/18/2020   CALCIUM 9.8 06/18/2020   ANIONGAP 6 10/14/2017   Lab Results  Component Value Date   CHOL 200 (H) 06/18/2020   Lab Results  Component Value Date   HDL 51 06/18/2020   Lab Results  Component Value Date    LDLCALC 131 (H) 06/18/2020   Lab Results  Component Value Date   TRIG 98 06/18/2020   Lab Results  Component Value Date   CHOLHDL 3.9 06/18/2020   Lab Results  Component Value Date   HGBA1C 5.0 10/28/2017   Assessment & Plan:   1. Hypertensive urgency Blood pressures are elevated today. Clonidine 0.3 mg given to patient in office and blood pressures remain elevated. We referred him to ED via ambulance at this time. Patient refused and signed AMA form at discharge. He will re-start taking his hypertensive medications as prescribed. He denies severe headaches, confusion, seizures, double vision, and blurred vision, nausea and vomiting. He will report to ED if he experiences these symptoms. Patient verbalized understanding.    2. Hypertension, essential He will continue to take medications as prescribed, to decrease high sodium intake, excessive alcohol intake, increase potassium intake, smoking cessation, and increase physical activity of at least 30 minutes of cardio activity daily. He will continue to follow Heart Healthy or DASH diet. - Urinalysis Dipstick - CBC with Differential - Comprehensive metabolic panel - Lipid Panel - Vitamin B12 - Vitamin D, 25-hydroxy - TSH - cloNIDine (CATAPRES) tablet 0.1 mg - finasteride (PROSCAR) 5 MG tablet; Take 1 tablet (5 mg total) by mouth daily.  Dispense: 90 tablet; Refill: 3 - hydrochlorothiazide (HYDRODIURIL) 25 MG tablet; TAKE 1/2 TABLET (12.5 MG TOTAL) BY MOUTH DAILY.  Dispense: 45 tablet; Refill: 3 - lisinopril (ZESTRIL) 20 MG tablet; Take 1 tablet (20 mg total) by mouth daily.  Dispense: 90 tablet; Refill: 3  3. Erectile dysfunction, unspecified erectile dysfunction type - PSA - sildenafil (VIAGRA) 100 MG tablet; Take 1 tablet (100 mg  total) by mouth daily as needed for erectile dysfunction.  Dispense: 10 tablet; Refill: 2  4. Benign prostatic hyperplasia with elevated prostate specific antigen (PSA) - PSA - sildenafil (VIAGRA) 100  MG tablet; Take 1 tablet (100 mg total) by mouth daily as needed for erectile dysfunction.  Dispense: 10 tablet; Refill: 2 - finasteride (PROSCAR) 5 MG tablet; Take 1 tablet (5 mg total) by mouth daily.  Dispense: 90 tablet; Refill: 3  5. Prostate cancer ([place]) - PSA - sildenafil (VIAGRA) 100 MG tablet; Take 1 tablet (100 mg total) by mouth daily as needed for erectile dysfunction.  Dispense: 10 tablet; Refill: 2  6. Elevated PSA Most recent PSA on 04/15/2019 elevated at 42.3. He will continue to follow up with Urology as needed to receive Lupron injections.  - PSA - sildenafil (VIAGRA) 100 MG tablet; Take 1 tablet (100 mg total) by mouth daily as needed for erectile dysfunction.  Dispense: 10 tablet; Refill: 2 - finasteride (PROSCAR) 5 MG tablet; Take 1 tablet (5 mg total) by mouth daily.  Dispense: 90 tablet; Refill: 3  7. Urinary retention - PSA - finasteride (PROSCAR) 5 MG tablet; Take 1 tablet (5 mg total) by mouth daily.  Dispense: 90 tablet; Refill: 3  8. Follow up He will follow up in 4 days for blood pressure recheck only.  He will follow up 6 months.    Meds ordered this encounter  Medications  . DISCONTD: sildenafil (VIAGRA) 100 MG tablet    Sig: Take 1 tablet (100 mg total) by mouth daily as needed for erectile dysfunction.    Dispense:  10 tablet    Refill:  2  . sildenafil (VIAGRA) 100 MG tablet    Sig: Take 1 tablet (100 mg total) by mouth daily as needed for erectile dysfunction.    Dispense:  10 tablet    Refill:  2  . cloNIDine (CATAPRES) tablet 0.1 mg  . finasteride (PROSCAR) 5 MG tablet    Sig: Take 1 tablet (5 mg total) by mouth daily.    Dispense:  90 tablet    Refill:  3  . hydrochlorothiazide (HYDRODIURIL) 25 MG tablet    Sig: TAKE 1/2 TABLET (12.5 MG TOTAL) BY MOUTH DAILY.    Dispense:  45 tablet    Refill:  3  . lisinopril (ZESTRIL) 20 MG tablet    Sig: Take 1 tablet (20 mg total) by mouth daily.    Dispense:  90 tablet    Refill:  3    Orders  Placed This Encounter  Procedures  . CBC with Differential  . Comprehensive metabolic panel  . Lipid Panel  . Vitamin B12  . Vitamin D, 25-hydroxy  . PSA  . TSH  . TSH  . Specimen status report  . Urinalysis Dipstick    Referral Orders  No referral(s) requested today    Kathe Becton,  MSN, FNP-BC [place], [place] [address] [phone] fax   Problem List Items Addressed This Visit      Cardiovascular and Mediastinum   Hypertension, essential   Relevant Medications   sildenafil (VIAGRA) 100 MG tablet   finasteride (PROSCAR) 5 MG tablet   hydrochlorothiazide (HYDRODIURIL) 25 MG tablet   lisinopril (ZESTRIL) 20 MG tablet   Other Relevant Orders   Urinalysis Dipstick (Completed)   CBC with Differential (Completed)   Comprehensive metabolic panel (Completed)   Lipid Panel (Completed)  Vitamin B12 (Completed)   Vitamin D, 25-hydroxy (Completed)   TSH     Genitourinary   Benign prostatic hyperplasia with elevated prostate specific antigen (PSA)   Relevant Medications   sildenafil (VIAGRA) 100 MG tablet   finasteride (PROSCAR) 5 MG tablet   Other Relevant Orders   PSA (Completed)   Prostate cancer ([place])   Relevant Medications   sildenafil (VIAGRA) 100 MG tablet   Other Relevant Orders   PSA (Completed)   Urinary retention   Relevant Medications   finasteride (PROSCAR) 5 MG tablet   Other Relevant Orders   PSA (Completed)     Other   Elevated PSA   Relevant Medications   sildenafil (VIAGRA) 100 MG tablet   finasteride (PROSCAR) 5 MG tablet   Other Relevant Orders   PSA (Completed)    Other Visit Diagnoses    Hypertensive urgency    -  Primary   Relevant Medications   sildenafil (VIAGRA) 100 MG tablet   cloNIDine (CATAPRES) tablet 0.1 mg (Completed)   hydrochlorothiazide (HYDRODIURIL) 25 MG tablet   lisinopril (ZESTRIL) 20  MG tablet   Erectile dysfunction, unspecified erectile dysfunction type       Relevant Medications   sildenafil (VIAGRA) 100 MG tablet   Other Relevant Orders   PSA (Completed)   Follow up          Meds ordered this encounter  Medications  . DISCONTD: sildenafil (VIAGRA) 100 MG tablet    Sig: Take 1 tablet (100 mg total) by mouth daily as needed for erectile dysfunction.    Dispense:  10 tablet    Refill:  2  . sildenafil (VIAGRA) 100 MG tablet    Sig: Take 1 tablet (100 mg total) by mouth daily as needed for erectile dysfunction.    Dispense:  10 tablet    Refill:  2  . cloNIDine (CATAPRES) tablet 0.1 mg  . finasteride (PROSCAR) 5 MG tablet    Sig: Take 1 tablet (5 mg total) by mouth daily.    Dispense:  90 tablet    Refill:  3  . hydrochlorothiazide (HYDRODIURIL) 25 MG tablet    Sig: TAKE 1/2 TABLET (12.5 MG TOTAL) BY MOUTH DAILY.    Dispense:  45 tablet    Refill:  3  . lisinopril (ZESTRIL) 20 MG tablet    Sig: Take 1 tablet (20 mg total) by mouth daily.    Dispense:  90 tablet    Refill:  3    Follow-up: Return in about 6 months (around 12/18/2020).    Azzie Glatter, FNP

## 2020-06-19 LAB — COMPREHENSIVE METABOLIC PANEL
ALT: 10 IU/L (ref 0–44)
AST: 11 IU/L (ref 0–40)
Albumin/Globulin Ratio: 1.4 (ref 1.2–2.2)
Albumin: 4.3 g/dL (ref 3.8–4.9)
Alkaline Phosphatase: 76 IU/L (ref 48–121)
BUN/Creatinine Ratio: 16 (ref 9–20)
BUN: 23 mg/dL (ref 6–24)
Bilirubin Total: 0.2 mg/dL (ref 0.0–1.2)
CO2: 21 mmol/L (ref 20–29)
Calcium: 9.8 mg/dL (ref 8.7–10.2)
Chloride: 107 mmol/L — ABNORMAL HIGH (ref 96–106)
Creatinine, Ser: 1.4 mg/dL — ABNORMAL HIGH (ref 0.76–1.27)
GFR calc Af Amer: 65 mL/min/{1.73_m2} (ref >59)
GFR calc non Af Amer: 57 mL/min/{1.73_m2} — ABNORMAL LOW (ref >59)
Globulin, Total: 3.1 g/dL (ref 1.5–4.5)
Glucose: 93 mg/dL (ref 65–99)
Potassium: 4.2 mmol/L (ref 3.5–5.2)
Sodium: 140 mmol/L (ref 134–144)
Total Protein: 7.4 g/dL (ref 6.0–8.5)

## 2020-06-19 LAB — CBC WITH DIFFERENTIAL/PLATELET
Basophils Absolute: 0 10*3/uL (ref 0.0–0.2)
Basos: 1 %
EOS (ABSOLUTE): 0.2 10*3/uL (ref 0.0–0.4)
Eos: 4 %
Hematocrit: 36.7 % — ABNORMAL LOW (ref 37.5–51.0)
Hemoglobin: 12.5 g/dL — ABNORMAL LOW (ref 13.0–17.7)
Immature Grans (Abs): 0 10*3/uL (ref 0.0–0.1)
Immature Granulocytes: 0 %
Lymphocytes Absolute: 2 10*3/uL (ref 0.7–3.1)
Lymphs: 45 %
MCH: 28.3 pg (ref 26.6–33.0)
MCHC: 34.1 g/dL (ref 31.5–35.7)
MCV: 83 fL (ref 79–97)
Monocytes Absolute: 0.4 10*3/uL (ref 0.1–0.9)
Monocytes: 9 %
Neutrophils Absolute: 1.9 10*3/uL (ref 1.4–7.0)
Neutrophils: 41 %
Platelets: 242 10*3/uL (ref 150–450)
RBC: 4.42 x10E6/uL (ref 4.14–5.80)
RDW: 13.1 % (ref 11.6–15.4)
WBC: 4.5 10*3/uL (ref 3.4–10.8)

## 2020-06-19 LAB — PSA: Prostate Specific Ag, Serum: 8.5 ng/mL — ABNORMAL HIGH (ref 0.0–4.0)

## 2020-06-19 LAB — VITAMIN D 25 HYDROXY (VIT D DEFICIENCY, FRACTURES): Vit D, 25-Hydroxy: 21.7 ng/mL — ABNORMAL LOW (ref 30.0–100.0)

## 2020-06-19 LAB — LIPID PANEL
Chol/HDL Ratio: 3.9 ratio (ref 0.0–5.0)
Cholesterol, Total: 200 mg/dL — ABNORMAL HIGH (ref 100–199)
HDL: 51 mg/dL (ref >39)
LDL Chol Calc (NIH): 131 mg/dL — ABNORMAL HIGH (ref 0–99)
Triglycerides: 98 mg/dL (ref 0–149)
VLDL Cholesterol Cal: 18 mg/dL (ref 5–40)

## 2020-06-19 LAB — VITAMIN B12: Vitamin B-12: 1555 pg/mL — ABNORMAL HIGH (ref 232–1245)

## 2020-06-20 DIAGNOSIS — I160 Hypertensive urgency: Secondary | ICD-10-CM | POA: Insufficient documentation

## 2020-06-20 LAB — SPECIMEN STATUS REPORT

## 2020-06-20 LAB — TSH: TSH: 1.58 u[IU]/mL (ref 0.450–4.500)

## 2020-06-20 MED ORDER — FINASTERIDE 5 MG PO TABS: 5.0000 mg | ORAL_TABLET | Freq: Every day | ORAL | 3 refills | Status: DC

## 2020-06-20 MED ORDER — LISINOPRIL 20 MG PO TABS: 20.0000 mg | ORAL_TABLET | Freq: Every day | ORAL | 3 refills | Status: DC

## 2020-06-20 MED ORDER — HYDROCHLOROTHIAZIDE 25 MG PO TABS: ORAL_TABLET | ORAL | 3 refills | Status: DC

## 2020-07-16 DIAGNOSIS — C61 Malignant neoplasm of prostate: Secondary | ICD-10-CM | POA: Diagnosis not present

## 2020-07-23 DIAGNOSIS — C778 Secondary and unspecified malignant neoplasm of lymph nodes of multiple regions: Secondary | ICD-10-CM | POA: Diagnosis not present

## 2020-07-23 DIAGNOSIS — C61 Malignant neoplasm of prostate: Secondary | ICD-10-CM | POA: Diagnosis not present

## 2020-07-23 DIAGNOSIS — N522 Drug-induced erectile dysfunction: Secondary | ICD-10-CM | POA: Diagnosis not present

## 2020-08-01 DIAGNOSIS — C61 Malignant neoplasm of prostate: Secondary | ICD-10-CM | POA: Diagnosis not present

## 2020-08-01 DIAGNOSIS — Z5111 Encounter for antineoplastic chemotherapy: Secondary | ICD-10-CM | POA: Diagnosis not present

## 2020-08-03 MED FILL — TAMSULOSIN HCL 0.4 MG CAP: 0.4 | 30 days supply | Qty: 30 | Fill #6

## 2020-08-16 ENCOUNTER — Other Ambulatory Visit: Payer: Self-pay | Admitting: Family Medicine

## 2020-08-16 DIAGNOSIS — T148XXA Other injury of unspecified body region, initial encounter: Secondary | ICD-10-CM

## 2020-09-05 ENCOUNTER — Other Ambulatory Visit: Payer: Self-pay | Admitting: Family Medicine

## 2020-09-05 DIAGNOSIS — R339 Retention of urine, unspecified: Secondary | ICD-10-CM

## 2020-09-05 DIAGNOSIS — R972 Elevated prostate specific antigen [PSA]: Secondary | ICD-10-CM

## 2020-09-05 MED FILL — LISINOPRIL 20 MG TABLET: 20 | 90 days supply | Qty: 90 | Fill #3

## 2020-09-06 ENCOUNTER — Other Ambulatory Visit: Payer: Self-pay | Admitting: Family Medicine

## 2020-09-06 ENCOUNTER — Telehealth: Payer: Self-pay | Admitting: Family Medicine

## 2020-09-06 DIAGNOSIS — N400 Enlarged prostate without lower urinary tract symptoms: Secondary | ICD-10-CM

## 2020-09-06 DIAGNOSIS — R972 Elevated prostate specific antigen [PSA]: Secondary | ICD-10-CM

## 2020-09-06 DIAGNOSIS — R339 Retention of urine, unspecified: Secondary | ICD-10-CM

## 2020-09-06 MED ORDER — TAMSULOSIN HCL 0.4 MG PO CAPS: 0.4000 mg | ORAL_CAPSULE | Freq: Every day | ORAL | 11 refills | Status: DC

## 2020-09-06 NOTE — Telephone Encounter (Signed)
Patient left message for refill on Flomax.

## 2020-09-07 MED FILL — TAMSULOSIN HCL 0.4 MG CAP: 0.4 | 30 days supply | Qty: 30 | Fill #0

## 2020-10-08 ENCOUNTER — Ambulatory Visit (INDEPENDENT_AMBULATORY_CARE_PROVIDER_SITE_OTHER): Payer: BC Managed Care – PPO | Admitting: Family Medicine

## 2020-10-08 ENCOUNTER — Other Ambulatory Visit: Payer: Self-pay | Admitting: Family Medicine

## 2020-10-08 ENCOUNTER — Encounter: Payer: Self-pay | Admitting: Family Medicine

## 2020-10-08 ENCOUNTER — Other Ambulatory Visit: Payer: Self-pay

## 2020-10-08 VITALS — BP 152/98 | HR 94 | Temp 98.3°F | Resp 18 | Ht 71.0 in | Wt 291.2 lb

## 2020-10-08 DIAGNOSIS — Z09 Encounter for follow-up examination after completed treatment for conditions other than malignant neoplasm: Secondary | ICD-10-CM

## 2020-10-08 DIAGNOSIS — N400 Enlarged prostate without lower urinary tract symptoms: Secondary | ICD-10-CM

## 2020-10-08 DIAGNOSIS — I10 Essential (primary) hypertension: Secondary | ICD-10-CM

## 2020-10-08 DIAGNOSIS — I160 Hypertensive urgency: Secondary | ICD-10-CM | POA: Diagnosis not present

## 2020-10-08 DIAGNOSIS — N529 Male erectile dysfunction, unspecified: Secondary | ICD-10-CM | POA: Diagnosis not present

## 2020-10-08 DIAGNOSIS — Z0000 Encounter for general adult medical examination without abnormal findings: Secondary | ICD-10-CM

## 2020-10-08 DIAGNOSIS — Z760 Encounter for issue of repeat prescription: Secondary | ICD-10-CM

## 2020-10-08 DIAGNOSIS — R972 Elevated prostate specific antigen [PSA]: Secondary | ICD-10-CM

## 2020-10-08 DIAGNOSIS — C61 Malignant neoplasm of prostate: Secondary | ICD-10-CM

## 2020-10-08 DIAGNOSIS — Z23 Encounter for immunization: Secondary | ICD-10-CM

## 2020-10-08 DIAGNOSIS — R339 Retention of urine, unspecified: Secondary | ICD-10-CM

## 2020-10-08 DIAGNOSIS — N62 Hypertrophy of breast: Secondary | ICD-10-CM

## 2020-10-08 DIAGNOSIS — T50905A Adverse effect of unspecified drugs, medicaments and biological substances, initial encounter: Secondary | ICD-10-CM

## 2020-10-08 DIAGNOSIS — M62830 Muscle spasm of back: Secondary | ICD-10-CM

## 2020-10-08 DIAGNOSIS — T148XXA Other injury of unspecified body region, initial encounter: Secondary | ICD-10-CM

## 2020-10-08 MED ORDER — CLONIDINE HCL 0.1 MG PO TABS
0.1000 mg | ORAL_TABLET | Freq: Once | ORAL | Status: AC
  Administered 2020-10-08: 0.1 mg via ORAL

## 2020-10-08 MED ORDER — IBUPROFEN 800 MG PO TABS: 800.0000 mg | ORAL_TABLET | Freq: Three times a day (TID) | ORAL | 3 refills | Status: DC | PRN

## 2020-10-08 MED ORDER — CYCLOBENZAPRINE HCL 10 MG PO TABS: 10.0000 mg | ORAL_TABLET | Freq: Three times a day (TID) | ORAL | 6 refills | Status: DC | PRN

## 2020-10-08 MED FILL — IBUPROFEN 800 MG TABLET: 800 | 20 days supply | Qty: 60 | Fill #0

## 2020-10-08 MED FILL — TAMSULOSIN HCL 0.4 MG CAP: 0.4 | 30 days supply | Qty: 30 | Fill #1

## 2020-10-08 MED FILL — CYCLOBENZAPRINE 10 MG TAB: 10 | 20 days supply | Qty: 60 | Fill #0

## 2020-10-08 NOTE — Progress Notes (Signed)
[facility]    Established Patient Office Visit  Subjective:  Patient ID: Craig Collins, male    DOB: 1966/09/29  Age: 54 y.o. MRN: [id number]  CC:  Chief Complaint  Patient presents with  . Back Pain    X 1wk    HPI Craig Collins is a 54 year old male who presents for Follow Up today.    Patient Active Problem List   Diagnosis Date Noted  . Hypertensive urgency 06/20/2020  . Muscle strain 12/21/2019  . Prostate cancer ([place]) 12/21/2019  . Urinary retention 04/16/2019  . Benign prostatic hyperplasia with elevated prostate specific antigen (PSA) 04/16/2019  . Elevated PSA 04/16/2019  . Hypertension, essential 04/16/2019  . Benign neoplasm of rectum   . Diverticulosis of colon with hemorrhage   . Rectal bleeding 10/13/2017  . Acute blood loss anemia 10/13/2017  . Acute renal failure (ARF) ([place]) 10/13/2017  . Acute GI bleeding 10/13/2017  . Hematochezia     Patient Active Problem List   Diagnosis Date Noted  . Hypertensive urgency 06/20/2020  . Muscle strain 12/21/2019  . Prostate cancer ([place]) 12/21/2019  . Urinary retention 04/16/2019  . Benign prostatic hyperplasia with elevated prostate specific antigen (PSA) 04/16/2019  . Elevated PSA 04/16/2019  . Hypertension, essential 04/16/2019  . Benign neoplasm of rectum   . Diverticulosis of colon with hemorrhage   . Rectal bleeding 10/13/2017  . Acute blood loss anemia 10/13/2017  . Acute renal failure (ARF) ([place]) 10/13/2017  . Acute GI bleeding 10/13/2017  . Hematochezia    Current Status: Since his last office visit, he is doing well with no complaints.  He has c/o of chronic back pain today. He is currently taking Motrin to help relieve his pain, which he states elevates his blood pressures. His blood pressures are elevated today. He does monitor his blood pressures at home and they are normally 117-120/70's. He denies visual changes, chest pain, cough, shortness  of breath, heart palpitations, and falls. He has occasional headaches and dizziness with position changes. Denies severe headaches, confusion, seizures, double vision, and blurred vision, nausea and vomiting. He denies fevers, chills, fatigue, recent infections, weight loss, and night sweats. Denies GI problems such as diarrhea, and constipation. He has no reports of blood in stools, dysuria and hematuria. No depression or anxiety reported today. He is taking all medications as prescribed.   Past Medical History:  Diagnosis Date  . Benign prostate hyperplasia   . Elevated PSA   . Erectile dysfunction   . Hypertension   . Obesity (BMI 35.0-39.9 without comorbidity)   . Prostate cancer ([place]) 05/2019  . Sleep apnea   . Urinary retention     Past Surgical History:  Procedure Laterality Date  . COLONOSCOPY N/A 10/14/2017   Procedure: COLONOSCOPY;  Surgeon: Gatha Mayer, MD;  Location: [facility] ENDOSCOPY;  Service: Endoscopy;  Laterality: N/A;    Family History  Problem Relation Age of Onset  . Hypertension Other   . Hypertension Brother   . Colon cancer Neg Hx     Social History   Socioeconomic History  . Marital status: Married    Spouse name: Not on file  . Number of children: Not on file  . Years of education: Not on file  . Highest education level: Not on file  Occupational History  . Not on file  Tobacco Use  . Smoking status: Never Smoker  . Smokeless tobacco: Never Used  Vaping Use  . Vaping Use: Never used  Substance and Sexual Activity  . Alcohol use: Yes    Comment: occ  . Drug use: No  . Sexual activity: Yes    Birth control/protection: Condom  Other Topics Concern  . Not on file  Social History Narrative  . Not on file   Social Determinants of Health   Financial Resource Strain:   . Difficulty of Paying Living Expenses: Not on file  Food Insecurity:   . Worried About Charity fundraiser in the Last Year: Not on file  . Ran Out of Food in the Last Year:  Not on file  Transportation Needs:   . Lack of Transportation (Medical): Not on file  . Lack of Transportation (Non-Medical): Not on file  Physical Activity:   . Days of Exercise per Week: Not on file  . Minutes of Exercise per Session: Not on file  Stress:   . Feeling of Stress : Not on file  Social Connections:   . Frequency of Communication with Friends and Family: Not on file  . Frequency of Social Gatherings with Friends and Family: Not on file  . Attends Religious Services: Not on file  . Active Member of Clubs or Organizations: Not on file  . Attends Archivist Meetings: Not on file  . Marital Status: Not on file  Intimate Partner Violence:   . Fear of Current or Ex-Partner: Not on file  . Emotionally Abused: Not on file  . Physically Abused: Not on file  . Sexually Abused: Not on file    Outpatient Medications Prior to Visit  Medication Sig Dispense Refill  . aspirin EC 81 MG tablet Take 81 mg by mouth daily.    . Calcium Carbonate-Vitamin D (CALCIUM 500/D) 500-125 MG-UNIT TABS Take by mouth.    . hydrochlorothiazide (HYDRODIURIL) 25 MG tablet TAKE 1/2 TABLET (12.5 MG TOTAL) BY MOUTH DAILY. 45 tablet 3  . lisinopril (ZESTRIL) 20 MG tablet Take 1 tablet (20 mg total) by mouth daily. 90 tablet 3  . NON FORMULARY Take 3 tablets by mouth daily. NUGENIX    . tamsulosin (FLOMAX) 0.4 MG CAPS capsule Take 1 capsule (0.4 mg total) by mouth daily. 30 capsule 11  . vitamin B-12 1000 MCG tablet Take 1 tablet (1,000 mcg total) by mouth daily. 30 tablet 0  . ibuprofen (ADVIL) 800 MG tablet Take 1 tablet (800 mg total) by mouth every 8 (eight) hours as needed. 45 tablet 3  . finasteride (PROSCAR) 5 MG tablet Take 1 tablet (5 mg total) by mouth daily. (Patient not taking: Reported on 10/08/2020) 90 tablet 3  . sildenafil (VIAGRA) 100 MG tablet Take 1 tablet (100 mg total) by mouth daily as needed for erectile dysfunction. (Patient not taking: Reported on 10/08/2020) 10 tablet 2    No facility-administered medications prior to visit.    No Known Allergies  ROS Review of Systems  Constitutional: Negative.   HENT: Negative.   Eyes: Negative.   Respiratory: Negative.   Cardiovascular: Negative.   Gastrointestinal: Negative.   Endocrine: Negative.   Genitourinary: Negative.   Musculoskeletal: Negative.   Skin: Negative.   Allergic/Immunologic: Negative.   Neurological: Positive for dizziness (occasional ) and headaches (occasional ).  Hematological: Negative.   Psychiatric/Behavioral: Negative.       Objective:    Physical Exam Vitals and nursing note reviewed.  Constitutional:      Appearance: Normal appearance.  HENT:     Head: Normocephalic  and atraumatic.     Nose: Nose normal.     Mouth/Throat:     Mouth: Mucous membranes are dry.     Pharynx: Oropharynx is clear.  Cardiovascular:     Rate and Rhythm: Normal rate and regular rhythm.     Pulses: Normal pulses.     Heart sounds: Normal heart sounds.  Pulmonary:     Effort: Pulmonary effort is normal.     Breath sounds: Normal breath sounds.  Abdominal:     General: Bowel sounds are normal.     Palpations: Abdomen is soft.  Musculoskeletal:     Cervical back: Normal range of motion and neck supple.     Comments: Limited ROM in spine  Skin:    General: Skin is warm and dry.       Neurological:     General: No focal deficit present.     Mental Status: He is alert and oriented to person, place, and time.  Psychiatric:        Mood and Affect: Mood normal.        Behavior: Behavior normal.        Thought Content: Thought content normal.        Judgment: Judgment normal.    BP (!) 152/98 (BP Location: Right Arm, Patient Position: Sitting, Cuff Size: Large)   Pulse 94   Temp 98.3 F (36.8 C)   Resp 18   Ht 5\' 11"  (1.803 m)   Wt 291 lb 3.2 oz (132.1 kg)   SpO2 99%   BMI 40.61 kg/m  Wt Readings from Last 3 Encounters:  10/08/20 291 lb 3.2 oz (132.1 kg)  06/18/20 283 lb 0.8 oz  (128.4 kg)  12/20/19 269 lb (122 kg)     Health Maintenance Due  Topic Date Due  . Hepatitis C Screening  Never done  . COVID-19 Vaccine (1) Never done  . INFLUENZA VACCINE  Never done    There are no preventive care reminders to display for this patient.  Lab Results  Component Value Date   TSH 1.580 06/18/2020   Lab Results  Component Value Date   WBC 4.5 06/18/2020   HGB 12.5 (L) 06/18/2020   HCT 36.7 (L) 06/18/2020   MCV 83 06/18/2020   PLT 242 06/18/2020   Lab Results  Component Value Date   NA 140 06/18/2020   K 4.2 06/18/2020   CO2 21 06/18/2020   GLUCOSE 93 06/18/2020   BUN 23 06/18/2020   CREATININE 1.40 (H) 06/18/2020   BILITOT 0.2 06/18/2020   ALKPHOS 76 06/18/2020   AST 11 06/18/2020   ALT 10 06/18/2020   PROT 7.4 06/18/2020   ALBUMIN 4.3 06/18/2020   CALCIUM 9.8 06/18/2020   ANIONGAP 6 10/14/2017   Lab Results  Component Value Date   CHOL 200 (H) 06/18/2020   Lab Results  Component Value Date   HDL 51 06/18/2020   Lab Results  Component Value Date   LDLCALC 131 (H) 06/18/2020   Lab Results  Component Value Date   TRIG 98 06/18/2020   Lab Results  Component Value Date   CHOLHDL 3.9 06/18/2020   Lab Results  Component Value Date   HGBA1C 5.0 10/28/2017      Assessment & Plan:   1. Hypertensive urgency Blood pressures are elevated today. Clonidine 0.1 mg given to patient in office and blood pressures remain elevated. We referred him to ED via ambulance at this time. Patient refused and signed AMA form at  discharge. He denies severe headaches, confusion, seizures, double vision, and blurred vision, nausea and vomiting. He will report to ED if he experiences these symptoms. Patient verbalized understanding.    2. Hypertension, essential - Urinalysis Dipstick - cloNIDine (CATAPRES) tablet 0.1 mg  3. Erectile dysfunction, unspecified erectile dysfunction type  4. Prostate cancer ([place])  5. Benign prostatic hyperplasia with  elevated prostate specific antigen (PSA)  6. Urinary retention  7. Elevated PSA Stable. He will continue to follow up with Urology and receive Lupron injections as prescribed.   8. Muscle spasm of back - cyclobenzaprine (FLEXERIL) 10 MG tablet; Take 1 tablet (10 mg total) by mouth 3 (three) times daily as needed for muscle spasms.  Dispense: 60 tablet; Refill: 6 - ibuprofen (ADVIL) 800 MG tablet; Take 1 tablet (800 mg total) by mouth every 8 (eight) hours as needed.  Dispense: 60 tablet; Refill: 3  9. Muscle strain - ibuprofen (ADVIL) 800 MG tablet; Take 1 tablet (800 mg total) by mouth every 8 (eight) hours as needed.  Dispense: 60 tablet; Refill: 3  10. Drug-induced gynecomastia  11. Medication refill  12. Healthcare maintenance - Flu Vaccine QUAD 6+ mos PF IM (Fluarix Quad PF)  13. Follow up He will follow in 6 months.   Meds ordered this encounter  Medications  . cloNIDine (CATAPRES) tablet 0.1 mg  . cyclobenzaprine (FLEXERIL) 10 MG tablet    Sig: Take 1 tablet (10 mg total) by mouth 3 (three) times daily as needed for muscle spasms.    Dispense:  60 tablet    Refill:  6  . ibuprofen (ADVIL) 800 MG tablet    Sig: Take 1 tablet (800 mg total) by mouth every 8 (eight) hours as needed.    Dispense:  60 tablet    Refill:  3    Orders Placed This Encounter  Procedures  . Flu Vaccine QUAD 6+ mos PF IM (Fluarix Quad PF)  . Urinalysis Dipstick    Referral Orders  No referral(s) requested today    Kathe Becton,  MSN, FNP-BC [facility] [address] [place], [place] [address] [phone]- fax   Problem List Items Addressed This Visit      Cardiovascular and Mediastinum   Hypertension, essential   Relevant Orders   Urinalysis Dipstick   Hypertensive urgency - Primary     Musculoskeletal and Integument   Muscle strain   Relevant Medications   ibuprofen  (ADVIL) 800 MG tablet     Genitourinary   Benign prostatic hyperplasia with elevated prostate specific antigen (PSA)   Prostate cancer ([place])   Urinary retention     Other   Elevated PSA    Other Visit Diagnoses    Erectile dysfunction, unspecified erectile dysfunction type       Muscle spasm of back       Relevant Medications   cyclobenzaprine (FLEXERIL) 10 MG tablet   ibuprofen (ADVIL) 800 MG tablet   Drug-induced gynecomastia       Medication refill       Healthcare maintenance       Relevant Orders   Flu Vaccine QUAD 6+ mos PF IM (Fluarix Quad PF)   Follow up          Meds ordered this encounter  Medications  . cloNIDine (CATAPRES) tablet 0.1 mg  . cyclobenzaprine (FLEXERIL) 10 MG tablet    Sig: Take 1 tablet (10 mg total) by mouth 3 (  three) times daily as needed for muscle spasms.    Dispense:  60 tablet    Refill:  6  . ibuprofen (ADVIL) 800 MG tablet    Sig: Take 1 tablet (800 mg total) by mouth every 8 (eight) hours as needed.    Dispense:  60 tablet    Refill:  3    Follow-up: Return in about 6 months (around 04/08/2021).    Azzie Glatter, FNP

## 2020-10-08 NOTE — Patient Instructions (Signed)
Muscle Cramps and Spasms Muscle cramps and spasms are when muscles tighten by themselves. They usually get better within minutes. Muscle cramps are painful. They are usually stronger and last longer than muscle spasms. Muscle spasms may or may not be painful. They can last a few seconds or much longer. Cramps and spasms can affect any muscle, but they occur most often in the calf muscles of the leg. They are usually not caused by a serious problem. In many cases, the cause is not known. Some common causes include:  Doing more physical work or exercise than your body is ready for.  Using the muscles too much (overuse) by repeating certain movements too many times.  Staying in a certain position for a long time.  Playing a sport or doing an activity without preparing properly.  Using bad form or technique while playing a sport or doing an activity.  Not having enough water in your body (dehydration).  Injury.  Side effects of some medicines.  Low levels of the salts and minerals in your blood (electrolytes), such as low potassium or calcium. Follow these instructions at home: Managing pain and stiffness      Massage, stretch, and relax the muscle. Do this for many minutes at a time.  If told, put heat on tight or tense muscles as often as told by your doctor. Use the heat source that your doctor recommends, such as a moist heat pack or a heating pad. ? Place a towel between your skin and the heat source. ? Leave the heat on for 20-30 minutes. ? Remove the heat if your skin turns bright red. This is very important if you are not able to feel pain, heat, or cold. You may have a greater risk of getting burned.  If told, put ice on the affected area. This may help if you are sore or have pain after a cramp or spasm. ? Put ice in a plastic bag. ? Place a towel between your skin and the bag. ? Leave the ice on for 20 minutes, 2-3 times a day.  Try taking hot showers or baths to help  relax tight muscles. Eating and drinking  Drink enough fluid to keep your pee (urine) pale yellow.  Eat a healthy diet to help ensure that your muscles work well. This should include: ? Fruits and vegetables. ? Lean protein. ? Whole grains. ? Low-fat or nonfat dairy products. General instructions  If you are having cramps often, avoid intense exercise for several days.  Take over-the-counter and prescription medicines only as told by your doctor.  Watch for any changes in your symptoms.  Keep all follow-up visits as told by your doctor. This is important. Contact a doctor if:  Your cramps or spasms get worse or happen more often.  Your cramps or spasms do not get better with time. Summary  Muscle cramps and spasms are when muscles tighten by themselves. They usually get better within minutes.  Cramps and spasms occur most often in the calf muscles of the leg.  Massage, stretch, and relax the muscle. This may help the cramp or spasm go away.  Drink enough fluid to keep your pee (urine) pale yellow. This information is not intended to replace advice given to you by your health care provider. Make sure you discuss any questions you have with your health care provider. Document Revised: 05/03/2018 Document Reviewed: 05/03/2018 Elsevier Patient Education  [place]. Cyclobenzaprine tablets What is this medicine? CYCLOBENZAPRINE (sye kloe  BEN za preen) is a muscle relaxer. It is used to treat muscle pain, spasms, and stiffness. This medicine may be used for other purposes; ask your health care provider or pharmacist if you have questions. COMMON BRAND NAME(S): Fexmid, Flexeril What should I tell my health care provider before I take this medicine? They need to know if you have any of these conditions:  heart disease, irregular heartbeat, or previous heart attack  liver disease  thyroid problem  an unusual or allergic reaction to cyclobenzaprine, tricyclic  antidepressants, lactose, other medicines, foods, dyes, or preservatives  pregnant or trying to get pregnant  breast-feeding How should I use this medicine? Take this medicine by mouth with a glass of water. Follow the directions on the prescription label. If this medicine upsets your stomach, take it with food or milk. Take your medicine at regular intervals. Do not take it more often than directed. Talk to your pediatrician regarding the use of this medicine in children. Special care may be needed. Overdosage: If you think you have taken too much of this medicine contact a poison control center or emergency room at once. NOTE: This medicine is only for you. Do not share this medicine with others. What if I miss a dose? If you miss a dose, take it as soon as you can. If it is almost time for your next dose, take only that dose. Do not take double or extra doses. What may interact with this medicine? Do not take this medicine with any of the following medications:  MAOIs like Carbex, Eldepryl, Marplan, Nardil, and Parnate  narcotic medicines for cough  safinamide This medicine may also interact with the following medications:  alcohol  bupropion  antihistamines for allergy, cough and cold  certain medicines for anxiety or sleep  certain medicines for bladder problems like oxybutynin, tolterodine  certain medicines for depression like amitriptyline, fluoxetine, sertraline  certain medicines for Parkinson's disease like benztropine, trihexyphenidyl  certain medicines for seizures like phenobarbital, primidone  certain medicines for stomach problems like dicyclomine, hyoscyamine  certain medicines for travel sickness like scopolamine  general anesthetics like halothane, isoflurane, methoxyflurane, propofol  ipratropium  local anesthetics like lidocaine, pramoxine, tetracaine  medicines that relax muscles for surgery  narcotic medicines for pain  phenothiazines like  chlorpromazine, mesoridazine, prochlorperazine, thioridazine  verapamil This list may not describe all possible interactions. Give your health care provider a list of all the medicines, herbs, non-prescription drugs, or dietary supplements you use. Also tell them if you smoke, drink alcohol, or use illegal drugs. Some items may interact with your medicine. What should I watch for while using this medicine? Tell your doctor or health care professional if your symptoms do not start to get better or if they get worse. You may get drowsy or dizzy. Do not drive, use machinery, or do anything that needs mental alertness until you know how this medicine affects you. Do not stand or sit up quickly, especially if you are an older patient. This reduces the risk of dizzy or fainting spells. Alcohol may interfere with the effect of this medicine. Avoid alcoholic drinks. If you are taking another medicine that also causes drowsiness, you may have more side effects. Give your health care provider a list of all medicines you use. Your doctor will tell you how much medicine to take. Do not take more medicine than directed. Call emergency for help if you have problems breathing or unusual sleepiness. Your mouth may get dry. Chewing sugarless gum  or sucking hard candy, and drinking plenty of water may help. Contact your doctor if the problem does not go away or is severe. What side effects may I notice from receiving this medicine? Side effects that you should report to your doctor or health care professional as soon as possible:  allergic reactions like skin rash, itching or hives, swelling of the face, lips, or tongue  breathing problems  chest pain  fast, irregular heartbeat  hallucinations  seizures  unusually weak or tired Side effects that usually do not require medical attention (report to your doctor or health care professional if they continue or are bothersome):  headache  nausea, vomiting This  list may not describe all possible side effects. Call your doctor for medical advice about side effects. You may report side effects to FDA at 1-800-FDA-1088. Where should I keep my medicine? Keep out of the reach of children. Store at room temperature between 15 and 30 degrees C (59 and 86 degrees F). Keep container tightly closed. Throw away any unused medicine after the expiration date. NOTE: This sheet is a summary. It may not cover all possible information. If you have questions about this medicine, talk to your doctor, pharmacist, or health care provider.  2020 Elsevier/Gold Standard (2018-11-10 12:49:26)

## 2020-10-10 ENCOUNTER — Encounter: Payer: Self-pay | Admitting: Family Medicine

## 2020-10-23 DIAGNOSIS — C61 Malignant neoplasm of prostate: Secondary | ICD-10-CM | POA: Diagnosis not present

## 2020-11-05 ENCOUNTER — Telehealth: Payer: Self-pay | Admitting: Family Medicine

## 2020-11-05 MED FILL — TAMSULOSIN HCL 0.4 MG CAP: 0.4 | 30 days supply | Qty: 30 | Fill #2

## 2020-11-05 NOTE — Telephone Encounter (Signed)
Pt was called concerning awv w/ pcc. lvm to return call

## 2020-11-12 DIAGNOSIS — C61 Malignant neoplasm of prostate: Secondary | ICD-10-CM | POA: Diagnosis not present

## 2020-11-12 DIAGNOSIS — C778 Secondary and unspecified malignant neoplasm of lymph nodes of multiple regions: Secondary | ICD-10-CM | POA: Diagnosis not present

## 2020-11-12 DIAGNOSIS — N62 Hypertrophy of breast: Secondary | ICD-10-CM | POA: Diagnosis not present

## 2020-11-12 DIAGNOSIS — C7951 Secondary malignant neoplasm of bone: Secondary | ICD-10-CM | POA: Diagnosis not present

## 2020-12-03 ENCOUNTER — Other Ambulatory Visit: Payer: Self-pay | Admitting: Family Medicine

## 2020-12-03 DIAGNOSIS — I10 Essential (primary) hypertension: Secondary | ICD-10-CM

## 2020-12-03 NOTE — Telephone Encounter (Signed)
Please see refill request.

## 2020-12-04 ENCOUNTER — Other Ambulatory Visit: Payer: Self-pay | Admitting: Family Medicine

## 2020-12-04 MED FILL — LISINOPRIL 20 MG TABLET: 20 | 90 days supply | Qty: 90 | Fill #0

## 2020-12-10 MED FILL — TAMSULOSIN HCL 0.4 MG CAP: 0.4 | 30 days supply | Qty: 30 | Fill #3

## 2020-12-11 MED FILL — IBUPROFEN 800 MG TABLET: 800 | 20 days supply | Qty: 60 | Fill #1

## 2020-12-18 ENCOUNTER — Ambulatory Visit: Payer: BC Managed Care – PPO | Admitting: Family Medicine

## 2020-12-24 ENCOUNTER — Ambulatory Visit: Payer: Self-pay | Admitting: Surgery

## 2020-12-24 DIAGNOSIS — N62 Hypertrophy of breast: Secondary | ICD-10-CM | POA: Diagnosis not present

## 2020-12-24 DIAGNOSIS — T50905A Adverse effect of unspecified drugs, medicaments and biological substances, initial encounter: Secondary | ICD-10-CM | POA: Diagnosis not present

## 2020-12-24 NOTE — H&P (Signed)
Craig Collins Appointment: 12/24/2020 3:00 PM Location: [facility] Patient #: [id number] DOB: 1966-02-11 Married / Language: English / Race: Black or African American Male  History of Present Illness Craig Collins A. Masen Luallen MD; 12/24/2020 4:08 PM) Patient words: Patient sent at the request of Dr. Mena Goes of urology for gynecomastia. The patient is stage IV prostate cancer and is managed medically. Medications he takes do suppress the effects of testosterone which in turn will magnify the effects of admission and progesterone. He has significant severe gynecomastia. He denies any breast pain, breast mass or nipple discharge. His become uncomfortable given the increase in size. No history of breast cancer in his family.  The patient is a 55 year old male.   Past Surgical History Jaynee Collins, [place]; 12/24/2020 3:11 PM) Colon Polyp Removal - Colonoscopy Oral Surgery  Diagnostic Studies History Jaynee Collins, [place]; 12/24/2020 3:11 PM) Colonoscopy 1-5 years ago  Allergies Jaynee Collins, CMA; 12/24/2020 3:11 PM) No Known Drug Allergies [12/24/2020]: Allergies Reconciled  Medication History Jaynee Collins, CMA; 12/24/2020 3:14 PM) Lisinopril (20MG  Tablet, Oral) Active. Tamsulosin HCl (0.4MG  Capsule, Oral) Active. Ibuprofen (800MG  Tablet, Oral) Active. hydroCHLOROthiazide (25MG  Tablet, Oral) Active. Erleada (60MG  Tablet, Oral) Active. Cyclobenzaprine HCl (10MG  Tablet, Oral) Active. Vitamin B-12 ( Tablet, 1 (one) Oral) Active. Calcium Carbonate w/Vitamin D (600-400MG -UNIT Tablet, Oral) Active. D3 (Oral) Specific strength unknown - Active. Aspirin (81MG  Tablet, 1 (one) Oral) Active. Medications Reconciled  Social History , ; 12/24/2020 3:11 PM) Alcohol use Occasional alcohol use. Caffeine use Tea. No drug use Tobacco use Never smoker.  Family History , ; 12/24/2020 3:11 PM) Alcohol Abuse Family Members In  General. Cerebrovascular Accident Father. Diabetes Mellitus Brother. Heart Disease Brother. Heart disease in male family member before age 61 Heart disease in male family member before age 39 Seizure disorder Father.  Other Problems 02/21/2021, CMA; 12/24/2020 3:11 PM) Enlarged Prostate High blood pressure Prostate Cancer Sleep Apnea     Review of Systems [place] CMA; 12/24/2020 3:11 PM) General Present- Night Sweats and Weight Gain. Not Present- Appetite Loss, Chills, Fatigue, Fever and Weight Loss.  Vitals 76 CMA; 12/24/2020 3:15 PM) 12/24/2020 3:14 PM Weight: 290.38 lb Height: 71in Body Surface Area: 2.47 m Body Mass Index: 40.5 kg/m  Temp.: 18F  Pulse: 122 (Regular)  P.OX: 96% (Room air) BP: 170/98(Sitting, Left Arm, Standard)        Physical Exam (Clayton Jarmon A. Aydien Majette MD; 12/24/2020 4:09 PM)  General Mental Status-Alert. General Appearance-Consistent with stated age. Hydration-Well hydrated. Voice-Normal.  Head and Neck Head-normocephalic, atraumatic with no lesions or palpable masses. Trachea-midline. Thyroid Gland Characteristics - normal size and consistency.  Chest and Lung Exam Note: Severe excessive bilateral diffuse gynecomastia noted. No masses in either breast.  Cardiovascular Cardiovascular examination reveals -normal heart sounds, regular rate and rhythm with no murmurs and normal pedal pulses bilaterally.  Neurologic Neurologic evaluation reveals -alert and oriented x 3 with no impairment of recent or remote memory. Mental Status-Normal.  Lymphatic Head & Neck  General Head & Neck Lymphatics: Bilateral - Description - Normal. Axillary  General Axillary Region: Bilateral - Description - Normal. Tenderness - Non Tender.    Assessment & Plan (Nahome Bublitz A. Jamiya Nims MD; 12/24/2020 4:15 PM)  DRUG-INDUCED GYNECOMASTIA (N62) Impression: Gynecomastia secondary to hormone therapy  secondary to stage IV prostate cancer Medications he will be on pressors life when intact his testosterone levels which will directly impact breast development given that there will be some elevation of estrogen and progesterone.  This has caused significant gynecomastia. This is causing significant psychological or physical stress patient. He has a strong desire for vasectomy to reduce the breast burden to help with this condition. He is a good candidate for nipple preserving mastectomy which should keep his natural anatomy and treat his underlying condition. There is no medical treatment for this because the medications he is on a workup are more likely for many years if not the rest of his life. He has stable disease and certainly at this point time is not an risk of rapid decline from his prostate cancer. It is reasonable to consider bilateral mastectomy for his excessive gynecomastia in this circumstance. Tinged and he may have some element of recurrence of this given the fact that he is on medications as stated above. Risks and benefits of surgery discussed as well as potential issues with getting scheduled. Discussed treatment options for breast cancer to include breast conservation vs mastectomy with reconstruction. Pt has decided on mastectomy. Risk include bleeding, infection, flap necrosis, pain, numbness, nipple loss recurrence, hematoma, other surgery needs. Pt understands and agrees to proceed.  total time 45 minutes  Current Plans You are being scheduled for surgery- Our schedulers will call you.  You should hear from our office's scheduling department within 5 working days about the location, date, and time of surgery. We try to make accommodations for patient's preferences in scheduling surgery, but sometimes the OR schedule or the surgeon's schedule prevents [place] from making those accommodations.  If you have not heard from our office [phone]) in 5 working days, call the office and  ask for your surgeon's nurse.  If you have other questions about your diagnosis, plan, or surgery, call the office and ask for your surgeon's nurse.  Pt Education - [facility] Breast Cancer Information Given - [employer] "Breast Journey" Package Pt Education - [facility] Mastectomy HCI

## 2021-01-08 MED FILL — TAMSULOSIN HCL 0.4 MG CAP: 0.4 | 30 days supply | Qty: 30 | Fill #4

## 2021-01-22 MED FILL — CYCLOBENZAPRINE 10 MG TAB: 10 | 20 days supply | Qty: 60 | Fill #1

## 2021-01-22 NOTE — Pre-Procedure Instructions (Signed)
[place], [employer] [address] [place] [address] Phone: [phone] Fax: [facility] #[id number] [place], [address] [place] [address] Phone: [phone] Fax: [phone]      Your procedure is scheduled on Wednesday, February 9th.  Report to [facility] Main Entrance "A" at 11:45 A.M., and check in at the Admitting office.  Call this number if you have problems the morning of surgery:  [phone]  Call [phone] if you have any questions prior to your surgery date Monday-Friday 8am-4pm    Remember:  Do not eat after midnight the night before your surgery  You may drink clear liquids until 10:45 A.M. the morning of your surgery.   Clear liquids allowed are: Water, Non-Citrus Juices (without pulp), Carbonated Beverages, Clear Tea, Black Coffee Only, and Gatorade    Take these medicines the morning of surgery with A SIP OF WATER  tamsulosin (FLOMAX)  cyclobenzaprine (FLEXERIL)-use as needed  Follow your surgeon's instructions on when to stop Aspirin.  If no instructions were given by your surgeon then you will need to call the office to get those instructions.     As of today, STOP taking any Aspirin (unless otherwise instructed by your surgeon) Aleve, Naproxen, Ibuprofen, Motrin, Advil, Goody's, BC's, all herbal medications, fish oil, and all vitamins.                      Do not wear jewelry            Do not wear lotions, powders, colognes, or deodorant.            Men may shave face and neck.            Do not bring valuables to the hospital.            [facility] is not responsible for any belongings or valuables.  Do NOT Smoke (Tobacco/Vaping) or drink Alcohol 24 hours prior to your procedure If you use a CPAP at night, you may bring all equipment for your overnight stay.   Contacts, glasses, dentures  or bridgework may not be worn into surgery.      For patients admitted to the hospital, discharge time will be determined by your treatment team.   Patients discharged the day of surgery will not be allowed to drive home, and someone needs to stay with them for 24 hours.    Special instructions:   [facility]- Preparing For Surgery  Before surgery, you can play an important role. Because skin is not sterile, your skin needs to be as free of germs as possible. You can reduce the number of germs on your skin by washing with CHG (chlorahexidine gluconate) Soap before surgery.  CHG is an antiseptic cleaner which kills germs and bonds with the skin to continue killing germs even after washing.    Oral Hygiene is also important to reduce your risk of infection.  Remember - BRUSH YOUR TEETH THE MORNING OF SURGERY WITH YOUR REGULAR TOOTHPASTE  Please do not use if you have an allergy to CHG or antibacterial soaps. If your skin becomes reddened/irritated stop using the CHG.  Do not shave (including legs and underarms) for at least 48 hours prior to first CHG shower. It is OK to shave your face.  Please follow these instructions  carefully.   1. Shower the NIGHT BEFORE SURGERY and the MORNING OF SURGERY with CHG Soap.   2. If you chose to wash your hair, wash your hair first as usual with your normal shampoo.  3. After you shampoo, rinse your hair and body thoroughly to remove the shampoo.  4. Use CHG as you would any other liquid soap. You can apply CHG directly to the skin and wash gently with a scrungie or a clean washcloth.   5. Apply the CHG Soap to your body ONLY FROM THE NECK DOWN.  Do not use on open wounds or open sores. Avoid contact with your eyes, ears, mouth and genitals (private parts). Wash Face and genitals (private parts)  with your normal soap.   6. Wash thoroughly, paying special attention to the area where your surgery will be performed.  7. Thoroughly rinse your body with  warm water from the neck down.  8. DO NOT shower/wash with your normal soap after using and rinsing off the CHG Soap.  9. Pat yourself dry with a CLEAN TOWEL.  10. Wear CLEAN PAJAMAS to bed the night before surgery  11. Place CLEAN SHEETS on your bed the night of your first shower and DO NOT SLEEP WITH PETS.   Day of Surgery: Wear Clean/Comfortable clothing the morning of surgery Do not apply any deodorants/lotions.   Remember to brush your teeth WITH YOUR REGULAR TOOTHPASTE.   Please read over the following fact sheets that you were given.

## 2021-01-23 ENCOUNTER — Inpatient Hospital Stay (HOSPITAL_COMMUNITY)
Admission: RE | Admit: 2021-01-23 | Discharge: 2021-01-23 | Disposition: A | Discharge status: Home / Self Care | Payer: Self-pay | Source: Ambulatory Visit

## 2021-01-26 ENCOUNTER — Inpatient Hospital Stay (HOSPITAL_COMMUNITY): Admission: RE | Admit: 2021-01-26 | Payer: Self-pay | Source: Ambulatory Visit

## 2021-01-28 ENCOUNTER — Other Ambulatory Visit: Payer: Self-pay | Admitting: Pharmacy Technician

## 2021-01-30 ENCOUNTER — Ambulatory Visit: Admission: RE | Admit: 2021-01-30 | Payer: BC Managed Care – PPO | Source: Home / Self Care | Admitting: Surgery

## 2021-01-30 ENCOUNTER — Encounter: Admission: RE | Payer: Self-pay | Source: Home / Self Care

## 2021-01-30 SURGERY — MASTECTOMY, NIPPLE SPARING
Anesthesia: General | Site: Breast | Laterality: Bilateral

## 2021-01-30 MED FILL — IBUPROFEN 800 MG TABLET: 800 | 20 days supply | Qty: 60 | Fill #2

## 2021-01-30 MED FILL — CYCLOBENZAPRINE 10 MG TAB: 10 | 20 days supply | Qty: 60 | Fill #1

## 2021-02-08 MED FILL — TAMSULOSIN HCL 0.4 MG CAP: 0.4 | 30 days supply | Qty: 30 | Fill #5

## 2021-02-11 DIAGNOSIS — N522 Drug-induced erectile dysfunction: Secondary | ICD-10-CM | POA: Diagnosis not present

## 2021-02-11 DIAGNOSIS — C61 Malignant neoplasm of prostate: Secondary | ICD-10-CM | POA: Diagnosis not present

## 2021-03-31 MED FILL — Ibuprofen Tab 800 MG: ORAL | 20 days supply | Qty: 60 | Fill #0 | Status: CN

## 2021-03-31 MED FILL — Tamsulosin HCl Cap 0.4 MG: ORAL | 30 days supply | Qty: 30 | Fill #0 | Status: CN

## 2021-04-01 ENCOUNTER — Other Ambulatory Visit: Payer: Self-pay

## 2021-04-08 ENCOUNTER — Other Ambulatory Visit: Payer: Self-pay

## 2021-04-08 ENCOUNTER — Encounter: Payer: Self-pay | Admitting: Nurse Practitioner

## 2021-04-08 ENCOUNTER — Ambulatory Visit (INDEPENDENT_AMBULATORY_CARE_PROVIDER_SITE_OTHER): Payer: Self-pay | Admitting: Nurse Practitioner

## 2021-04-08 ENCOUNTER — Ambulatory Visit: Payer: BC Managed Care – PPO | Admitting: Family Medicine

## 2021-04-08 VITALS — BP 143/92 | HR 89 | Temp 99.3°F | Ht 71.0 in | Wt 304.0 lb

## 2021-04-08 DIAGNOSIS — G4733 Obstructive sleep apnea (adult) (pediatric): Secondary | ICD-10-CM

## 2021-04-08 DIAGNOSIS — I10 Essential (primary) hypertension: Secondary | ICD-10-CM

## 2021-04-08 DIAGNOSIS — Z13220 Encounter for screening for lipoid disorders: Secondary | ICD-10-CM

## 2021-04-08 DIAGNOSIS — Z8249 Family history of ischemic heart disease and other diseases of the circulatory system: Secondary | ICD-10-CM

## 2021-04-08 DIAGNOSIS — Z1159 Encounter for screening for other viral diseases: Secondary | ICD-10-CM

## 2021-04-08 DIAGNOSIS — C61 Malignant neoplasm of prostate: Secondary | ICD-10-CM

## 2021-04-08 LAB — POCT URINALYSIS DIPSTICK
Bilirubin, UA: NEGATIVE
Blood, UA: NEGATIVE
Clarity, UA: NEGATIVE
Glucose, UA: NEGATIVE
Ketones, UA: NEGATIVE
Leukocytes, UA: NEGATIVE
Nitrite, UA: NEGATIVE
Protein, UA: NEGATIVE
Spec Grav, UA: 1.02 (ref 1.010–1.025)
Urobilinogen, UA: 0.2 E.U./dL
pH, UA: 5.5 (ref 5.0–8.0)

## 2021-04-08 MED FILL — Ibuprofen Tab 800 MG: ORAL | 20 days supply | Qty: 60 | Fill #0 | Status: AC

## 2021-04-08 MED FILL — Tamsulosin HCl Cap 0.4 MG: ORAL | 30 days supply | Qty: 30 | Fill #0 | Status: AC

## 2021-04-08 NOTE — Progress Notes (Signed)
[place], [place]  [address] Phone:  [phone]   Fax:  [phone]   Established Patient Office Visit  Subjective:  Patient ID: Craig Collins, male    DOB: [birth year]  Age: 55 y.o. MRN: [id number]  CC:  Chief Complaint  Patient presents with  . Follow-up    6 month follow up     HPI Craig Collins presents for follow up. He  has a past medical history of Benign prostate hyperplasia, Elevated PSA, Erectile dysfunction, Hypertension, Obesity (BMI 35.0-39.9 without comorbidity), Prostate cancer ([place]) (05/2019), Sleep apnea, and Urinary retention.    Hypertension Patient is here for follow-up of elevated blood pressure. He is not exercising and is adherent to a low-salt diet. Blood pressure is well controlled at home.128-130/80-90; q 2-3 days Cardiac symptoms: none. Patient denies chest pain, dyspnea, irregular heart beat, orthopnea, palpitations and syncope. Cardiovascular risk factors: advanced age (older than 14 for men, 48 for women), hypertension, male gender, obesity (BMI >= 30 kg/m2) and sedentary lifestyle. Use of agents associated with hypertension: NSAIDS. History of target organ damage: nonerenal function stable however familiar Heart disease 2 brothers passed under the age of 73 from heart attack.    He has heel spurs. He is using [employer]. He is using this more due to weight gain. He feels like the cancer injection for his prostate cancer has caused the weight gain. He continues with his injections per urology. His PSA level is down.  He is requesting reevaluation of his PSA.  He reports this is monitored   Past Medical History:  Diagnosis Date  . Benign prostate hyperplasia   . Elevated PSA   . Erectile dysfunction   . Hypertension   . Obesity (BMI 35.0-39.9 without comorbidity)   . Prostate cancer ([place]) 05/2019  . Sleep apnea   . Urinary retention     Past Surgical History:  Procedure Laterality Date  . COLONOSCOPY N/A  10/14/2017   Procedure: COLONOSCOPY;  Surgeon: Gatha Mayer, MD;  Location: [facility] ENDOSCOPY;  Service: Endoscopy;  Laterality: N/A;    Family History  Problem Relation Age of Onset  . Hypertension Other   . Hypertension Brother   . Colon cancer Neg Hx     Social History   Socioeconomic History  . Marital status: Married    Spouse name: Not on file  . Number of children: Not on file  . Years of education: Not on file  . Highest education level: Not on file  Occupational History  . Not on file  Tobacco Use  . Smoking status: Never Smoker  . Smokeless tobacco: Never Used  Vaping Use  . Vaping Use: Never used  Substance and Sexual Activity  . Alcohol use: Yes    Comment: occ  . Drug use: No  . Sexual activity: Yes    Birth control/protection: Condom  Other Topics Concern  . Not on file  Social History Narrative  . Not on file   Social Determinants of Health   Financial Resource Strain: Not on file  Food Insecurity: Not on file  Transportation Needs: Not on file  Physical Activity: Not on file  Stress: Not on file  Social Connections: Not on file  Intimate Partner Violence: Not on file    Outpatient Medications Prior to Visit  Medication Sig Dispense Refill  . aspirin EC 81 MG tablet Take 81 mg by mouth daily.    . Calcium Carbonate-Vitamin D (  CALCIUM 500/D) 500-125 MG-UNIT TABS Take by mouth.    . cyclobenzaprine (FLEXERIL) 10 MG tablet TAKE 1 TABLET (10 MG TOTAL) BY MOUTH 3 (THREE) TIMES DAILY AS NEEDED FOR MUSCLE SPASMS. 60 tablet 6  . hydrochlorothiazide (HYDRODIURIL) 25 MG tablet TAKE 1/2 TABLET (12.5 MG TOTAL) BY MOUTH DAILY. 45 tablet 3  . ibuprofen (ADVIL) 800 MG tablet TAKE 1 TABLET (800 MG TOTAL) BY MOUTH EVERY 8 (EIGHT) HOURS AS NEEDED. 60 tablet 3  . lisinopril (ZESTRIL) 20 MG tablet TAKE 1 TABLET (20 MG TOTAL) BY MOUTH DAILY. 90 tablet 3  . NON FORMULARY Take 3 tablets by mouth daily. NUGENIX    . tamsulosin (FLOMAX) 0.4 MG CAPS capsule TAKE 1 CAPSULE  (0.4 MG TOTAL) BY MOUTH DAILY. 30 capsule 11  . vitamin B-12 1000 MCG tablet Take 1 tablet (1,000 mcg total) by mouth daily. 30 tablet 0  . sildenafil (VIAGRA) 100 MG tablet Take 1 tablet (100 mg total) by mouth daily as needed for erectile dysfunction. 10 tablet 2  . ERLEADA 60 MG tablet Take 240 mg by mouth daily.    . finasteride (PROSCAR) 5 MG tablet Take 1 tablet (5 mg total) by mouth daily. (Patient not taking: Reported on 10/08/2020) 90 tablet 3   No facility-administered medications prior to visit.    No Known Allergies  ROS Review of Systems    Objective:    Physical Exam Constitutional:      General: He is not in acute distress.    Appearance: He is obese. He is not ill-appearing, toxic-appearing or diaphoretic.  HENT:     Head: Normocephalic and atraumatic.  Cardiovascular:     Rate and Rhythm: Normal rate and regular rhythm.     Pulses: Normal pulses.     Heart sounds: Normal heart sounds.  Pulmonary:     Effort: Pulmonary effort is normal.     Breath sounds: Normal breath sounds.  Abdominal:     General: Bowel sounds are normal.     Palpations: Abdomen is soft.  Musculoskeletal:        General: Normal range of motion.     Cervical back: Normal range of motion.  Skin:    General: Skin is warm.     Capillary Refill: Capillary refill takes less than 2 seconds.  Neurological:     General: No focal deficit present.     Mental Status: He is alert and oriented to person, place, and time.  Psychiatric:        Mood and Affect: Mood normal.        Behavior: Behavior normal.        Thought Content: Thought content normal.        Judgment: Judgment normal.     BP (!) 143/92   Pulse 89   Temp 99.3 F (37.4 C) (Temporal)   Ht 5\' 11"  (1.803 m)   Wt (!) 304 lb (137.9 kg)   SpO2 95%   BMI 42.40 kg/m  Wt Readings from Last 3 Encounters:  04/08/21 (!) 304 lb (137.9 kg)  10/08/20 291 lb 3.2 oz (132.1 kg)  06/18/20 283 lb 0.8 oz (128.4 kg)     There are no  preventive care reminders to display for this patient.  There are no preventive care reminders to display for this patient.  Lab Results  Component Value Date   TSH 1.580 06/18/2020   Lab Results  Component Value Date   WBC 4.5 06/18/2020   HGB 12.5 (L) 06/18/2020  HCT 36.7 (L) 06/18/2020   MCV 83 06/18/2020   PLT 242 06/18/2020   Lab Results  Component Value Date   NA 140 06/18/2020   K 4.2 06/18/2020   CO2 21 06/18/2020   GLUCOSE 93 06/18/2020   BUN 23 06/18/2020   CREATININE 1.40 (H) 06/18/2020   BILITOT 0.2 06/18/2020   ALKPHOS 76 06/18/2020   AST 11 06/18/2020   ALT 10 06/18/2020   PROT 7.4 06/18/2020   ALBUMIN 4.3 06/18/2020   CALCIUM 9.8 06/18/2020   ANIONGAP 6 10/14/2017   Lab Results  Component Value Date   CHOL 200 (H) 06/18/2020   Lab Results  Component Value Date   HDL 51 06/18/2020   Lab Results  Component Value Date   LDLCALC 131 (H) 06/18/2020   Lab Results  Component Value Date   TRIG 98 06/18/2020   Lab Results  Component Value Date   CHOLHDL 3.9 06/18/2020   Lab Results  Component Value Date   HGBA1C 5.0 10/28/2017      Assessment & Plan:   Problem List Items Addressed This Visit      Cardiovascular and Mediastinum   Hypertension, essential - Primary Persistent  Encouraged on going compliance with current medication regimen Encouraged home monitoring and recording BP <130/80 Eating a heart-healthy diet with less salt Encouraged regular physical activity  Recommend Weight loss Recommend cardiology referral due to his family history and Hx of OSA and inconsistent with the use of CPAP   Relevant Orders   POCT Urinalysis Dipstick (Completed)   Comp. Metabolic Panel (12)     Genitourinary   Prostate cancer ([place]) Persistent on Lupron injections and Erleada; Urology monitored closely     Other Visit Diagnoses    Encounter for hepatitis C screening test for low risk patient       Relevant Orders   Hepatitis C antibody    Screening for cholesterol level       Relevant Orders   Lipid panel   OSA (obstructive sleep apnea)     CPAP no using daily      No orders of the defined types were placed in this encounter.   Follow-up: Return in about 6 months (around 10/08/2021).    Vevelyn Francois, NP

## 2021-04-08 NOTE — Patient Instructions (Addendum)
Managing Your Hypertension Hypertension, also called high blood pressure, is when the force of the blood pressing against the walls of the arteries is too strong. Arteries are blood vessels that carry blood from your heart throughout your body. Hypertension forces the heart to work harder to pump blood and may cause the arteries to become narrow or stiff. Understanding blood pressure readings Your personal target blood pressure may vary depending on your medical conditions, your age, and other factors. A blood pressure reading includes a higher number over a lower number. Ideally, your blood pressure should be below 120/80. You should know that:  The first, or top, number is called the systolic pressure. It is a measure of the pressure in your arteries as your heart beats.  The second, or bottom number, is called the diastolic pressure. It is a measure of the pressure in your arteries as the heart relaxes. Blood pressure is classified into four stages. Based on your blood pressure reading, your health care provider may use the following stages to determine what type of treatment you need, if any. Systolic pressure and diastolic pressure are measured in a unit called mmHg. Normal  Systolic pressure: below 120.  Diastolic pressure: below 80. Elevated  Systolic pressure: 120-129.  Diastolic pressure: below 80. Hypertension stage 1  Systolic pressure: 130-139.  Diastolic pressure: 80-89. Hypertension stage 2  Systolic pressure: 140 or above.  Diastolic pressure: 90 or above. How can this condition affect me? Managing your hypertension is an important responsibility. Over time, hypertension can damage the arteries and decrease blood flow to important parts of the body, including the brain, heart, and kidneys. Having untreated or uncontrolled hypertension can lead to:  A heart attack.  A stroke.  A weakened blood vessel (aneurysm).  Heart failure.  Kidney damage.  Eye  damage.  Metabolic syndrome.  Memory and concentration problems.  Vascular dementia. What actions can I take to manage this condition? Hypertension can be managed by making lifestyle changes and possibly by taking medicines. Your health care provider will help you make a plan to bring your blood pressure within a normal range. Nutrition  Eat a diet that is high in fiber and potassium, and low in salt (sodium), added sugar, and fat. An example eating plan is called the Dietary Approaches to Stop Hypertension (DASH) diet. To eat this way: ? Eat plenty of fresh fruits and vegetables. Try to fill one-half of your plate at each meal with fruits and vegetables. ? Eat whole grains, such as whole-wheat pasta, brown rice, or whole-grain bread. Fill about one-fourth of your plate with whole grains. ? Eat low-fat dairy products. ? Avoid fatty cuts of meat, processed or cured meats, and poultry with skin. Fill about one-fourth of your plate with lean proteins such as fish, chicken without skin, beans, eggs, and tofu. ? Avoid pre-made and processed foods. These tend to be higher in sodium, added sugar, and fat.  Reduce your daily sodium intake. Most people with hypertension should eat less than 1,500 mg of sodium a day.   Lifestyle  Work with your health care provider to maintain a healthy body weight or to lose weight. Ask what an ideal weight is for you.  Get at least 30 minutes of exercise that causes your heart to beat faster (aerobic exercise) most days of the week. Activities may include walking, swimming, or biking.  Include exercise to strengthen your muscles (resistance exercise), such as weight lifting, as part of your weekly exercise routine. Try   to do these types of exercises for 30 minutes at least 3 days a week.  Do not use any products that contain nicotine or tobacco, such as cigarettes, e-cigarettes, and chewing tobacco. If you need help quitting, ask your health care  provider.  Control any long-term (chronic) conditions you have, such as high cholesterol or diabetes.  Identify your sources of stress and find ways to manage stress. This may include meditation, deep breathing, or making time for fun activities.   Alcohol use  Do not drink alcohol if: ? Your health care provider tells you not to drink. ? You are pregnant, may be pregnant, or are planning to become pregnant.  If you drink alcohol: ? Limit how much you use to:  0-1 drink a day for women.  0-2 drinks a day for men. ? Be aware of how much alcohol is in your drink. In the [place] one drink equals one 12 oz bottle of beer (355 mL), one 5 oz glass of wine (148 mL), or one 1 oz glass of hard liquor (44 mL). Medicines Your health care provider may prescribe medicine if lifestyle changes are not enough to get your blood pressure under control and if:  Your systolic blood pressure is 130 or higher.  Your diastolic blood pressure is 80 or higher. Take medicines only as told by your health care provider. Follow the directions carefully. Blood pressure medicines must be taken as told by your health care provider. The medicine does not work as well when you skip doses. Skipping doses also puts you at risk for problems. Monitoring Before you monitor your blood pressure:  Do not smoke, drink caffeinated beverages, or exercise within 30 minutes before taking a measurement.  Use the bathroom and empty your bladder (urinate).  Sit quietly for at least 5 minutes before taking measurements. Monitor your blood pressure at home as told by your health care provider. To do this:  Sit with your back straight and supported.  Place your feet flat on the floor. Do not cross your legs.  Support your arm on a flat surface, such as a table. Make sure your upper arm is at heart level.  Each time you measure, take two or three readings one minute apart and record the results. You may also need to have your  blood pressure checked regularly by your health care provider.   General information  Talk with your health care provider about your diet, exercise habits, and other lifestyle factors that may be contributing to hypertension.  Review all the medicines you take with your health care provider because there may be side effects or interactions.  Keep all visits as told by your health care provider. Your health care provider can help you create and adjust your plan for managing your high blood pressure. Where to find more information  National Heart, Lung, and Blood Institute: www.nhlbi.nih.gov  American Heart Association: www.heart.org Contact a health care provider if:  You think you are having a reaction to medicines you have taken.  You have repeated (recurrent) headaches.  You feel dizzy.  You have swelling in your ankles.  You have trouble with your vision. Get help right away if:  You develop a severe headache or confusion.  You have unusual weakness or numbness, or you feel faint.  You have severe pain in your chest or abdomen.  You vomit repeatedly.  You have trouble breathing. These symptoms may represent a serious problem that is an emergency. Do not wait   to see if the symptoms will go away. Get medical help right away. Call your local emergency services (911 in the [place] Do not drive yourself to the hospital. Summary  Hypertension is when the force of blood pumping through your arteries is too strong. If this condition is not controlled, it may put you at risk for serious complications.  Your personal target blood pressure may vary depending on your medical conditions, your age, and other factors. For most people, a normal blood pressure is less than 120/80.  Hypertension is managed by lifestyle changes, medicines, or both.  Lifestyle changes to help manage hypertension include losing weight, eating a healthy, low-sodium diet, exercising more, stopping smoking, and  limiting alcohol. This information is not intended to replace advice given to you by your health care provider. Make sure you discuss any questions you have with your health care provider. Document Revised: 01/13/2020 Document Reviewed: 11/08/2019 Elsevier Patient Education  2021 [place]  A heel spur is a bony growth that forms on the bottom of the heel bone (calcaneus). Heel spurs are common. They often cause inflammation in the plantar fascia, which is the band of tissue that connects the toe bones to the heel bone. When the plantar fascia is inflamed, it is called plantar fasciitis. This may cause pain on the bottom of the foot, near the heel. Many people with plantar fasciitis also have heel spurs. However, spurs are not the cause of plantar fasciitis pain. What are the causes? The exact cause of heel spurs is not known. They may be caused by: Pressure on the heel bone. Bands of tissues that connect muscle to bone (tendons) pulling on the heel bone. What increases the risk? You are more likely to develop this condition if you: Are older than age 70. Are overweight. Have wear-and-tear arthritis (osteoarthritis). Have plantar fascia inflammation. Participate in sports or activities that include a lot of running or jumping. Wear poorly fitted shoes. What are the signs or symptoms? Some people have no symptoms. If you do have symptoms, they may include: Pain in the bottom of your heel. Pain that is worse when you first get out of bed. Pain that gets worse after walking or standing. How is this diagnosed? This condition may be diagnosed based on: Your symptoms and medical history. A physical exam. A foot X-ray. How is this treated? Treatment for this condition depends on how much pain you have. Treatment options may include: Doing stretching exercises and losing weight, if necessary. Wearing specific shoes or inserts inside of shoes (orthotics) for comfort and  support. Wearing splints on your feet while you sleep. Splints keep your feet in a position (usually a 90-degree angle) that should prevent and relieve the pain you feel when you first get out of bed. They also make stretching easier in the morning. Taking over-the-counter medicine to relieve pain, such as NSAIDs. Using high-intensity sound waves to break up the heel spur (extracorporeal shock wave therapy). Getting steroid injections in your heel to reduce inflammation. Having surgery, if your heel spur causes long-term (chronic) pain. Follow these instructions at home: Activity Avoid activities that cause pain until you recover, or for as long as told by your health care provider. Do stretching exercises as told. Stretch before exercising or being physically active. Managing pain, stiffness, and swelling If directed, put ice on your foot. To do this: Put ice in a plastic bag. Place a towel between your skin and the bag. Leave  the ice on for 20 minutes, 2-3 times a day. Remove the ice if your skin turns bright red. This is very important. If you cannot feel pain, heat, or cold, you have a greater risk of damage to the area. Move your toes often to reduce stiffness and swelling. When possible, raise (elevate) your foot above the level of your heart while you are sitting or lying down. General instructions Take over-the-counter and prescription medicines only as told by your health care provider. Wear supportive shoes that fit well. Wear splints, inserts, or orthotics as told by your health care provider. If recommended, work with your health care provider to lose weight. This can relieve pressure on your foot. Do not use any products that contain nicotine or tobacco, such as cigarettes, e-cigarettes, and chewing tobacco. These can delay bone healing. If you need help quitting, ask your health care provider. Keep all follow-up visits. This is important.   Where to find more  information American Academy of Orthopaedic Surgeons: www.orthoinfo.aaos.org Contact a health care provider if: Your pain does not go away with treatment. Your pain gets worse. Summary A heel spur is a bony growth that forms on the bottom of the heel bone (calcaneus). Heel spurs often cause inflammation in the plantar fascia, which is the band of tissue that connects the toes to the heel bone. This may cause pain on the bottom of the foot, near the heel. Doing stretching exercises, losing weight, wearing specific shoes or shoe inserts, wearing splints while you sleep, and taking pain medicine may ease the pain and stiffness. Other treatment options may include high-intensity sound waves to break up the heel spur, steroid injections, or surgery. This information is not intended to replace advice given to you by your health care provider. Make sure you discuss any questions you have with your health care provider. Document Revised: 04/03/2020 Document Reviewed: 04/03/2020 Elsevier Patient Education  2021 [employer].

## 2021-04-09 LAB — COMP. METABOLIC PANEL (12)
AST: 12 IU/L (ref 0–40)
Albumin/Globulin Ratio: 1.2 (ref 1.2–2.2)
Albumin: 4.3 g/dL (ref 3.8–4.9)
Alkaline Phosphatase: 103 IU/L (ref 44–121)
BUN/Creatinine Ratio: 11 (ref 9–20)
BUN: 15 mg/dL (ref 6–24)
Bilirubin Total: 0.3 mg/dL (ref 0.0–1.2)
Calcium: 9.7 mg/dL (ref 8.7–10.2)
Chloride: 103 mmol/L (ref 96–106)
Creatinine, Ser: 1.41 mg/dL — ABNORMAL HIGH (ref 0.76–1.27)
Globulin, Total: 3.5 g/dL (ref 1.5–4.5)
Glucose: 95 mg/dL (ref 65–99)
Potassium: 4.2 mmol/L (ref 3.5–5.2)
Sodium: 140 mmol/L (ref 134–144)
Total Protein: 7.8 g/dL (ref 6.0–8.5)
eGFR: 59 mL/min/{1.73_m2} — ABNORMAL LOW (ref >59)

## 2021-04-09 LAB — LIPID PANEL
Chol/HDL Ratio: 4.6 ratio (ref 0.0–5.0)
Cholesterol, Total: 220 mg/dL — ABNORMAL HIGH (ref 100–199)
HDL: 48 mg/dL (ref >39)
LDL Chol Calc (NIH): 156 mg/dL — ABNORMAL HIGH (ref 0–99)
Triglycerides: 92 mg/dL (ref 0–149)
VLDL Cholesterol Cal: 16 mg/dL (ref 5–40)

## 2021-04-09 LAB — HEPATITIS C ANTIBODY: Hep C Virus Ab: <0.1 s/co ratio (ref 0.0–0.9)

## 2021-04-12 ENCOUNTER — Other Ambulatory Visit: Payer: Self-pay

## 2021-05-09 MED FILL — Tamsulosin HCl Cap 0.4 MG: ORAL | 30 days supply | Qty: 30 | Fill #1 | Status: AC

## 2021-05-10 ENCOUNTER — Other Ambulatory Visit: Payer: Self-pay

## 2021-05-13 ENCOUNTER — Other Ambulatory Visit: Payer: Self-pay

## 2021-06-03 ENCOUNTER — Other Ambulatory Visit: Payer: Self-pay

## 2021-06-03 MED FILL — Lisinopril Tab 20 MG: ORAL | 90 days supply | Qty: 90 | Fill #0 | Status: CN

## 2021-06-07 ENCOUNTER — Other Ambulatory Visit: Payer: Self-pay

## 2021-06-07 MED FILL — Tamsulosin HCl Cap 0.4 MG: ORAL | 30 days supply | Qty: 30 | Fill #2 | Status: AC

## 2021-06-10 ENCOUNTER — Other Ambulatory Visit: Payer: Self-pay

## 2021-06-12 ENCOUNTER — Other Ambulatory Visit: Payer: Self-pay | Admitting: Nurse Practitioner

## 2021-06-12 ENCOUNTER — Other Ambulatory Visit: Payer: Self-pay

## 2021-06-12 ENCOUNTER — Telehealth: Payer: Self-pay

## 2021-06-12 DIAGNOSIS — I10 Essential (primary) hypertension: Secondary | ICD-10-CM

## 2021-06-12 MED ORDER — LISINOPRIL 20 MG PO TABS: ORAL_TABLET | Freq: Every day | ORAL | 3 refills | Status: DC

## 2021-06-12 MED ORDER — HYDROCHLOROTHIAZIDE 25 MG PO TABS: ORAL_TABLET | ORAL | 3 refills | Status: DC

## 2021-06-12 MED FILL — Lisinopril Tab 20 MG: ORAL | 30 days supply | Qty: 30 | Fill #0 | Status: AC

## 2021-06-12 NOTE — Telephone Encounter (Signed)
BP medicine   [facility]  [place] / [employer]

## 2021-06-12 NOTE — Telephone Encounter (Signed)
Medications refilled

## 2021-07-10 ENCOUNTER — Other Ambulatory Visit: Payer: Self-pay

## 2021-07-10 MED FILL — Tamsulosin HCl Cap 0.4 MG: ORAL | 30 days supply | Qty: 30 | Fill #3 | Status: AC

## 2021-07-15 ENCOUNTER — Other Ambulatory Visit: Payer: Self-pay

## 2021-07-22 ENCOUNTER — Other Ambulatory Visit: Payer: Self-pay

## 2021-07-22 ENCOUNTER — Telehealth: Payer: Self-pay

## 2021-07-22 DIAGNOSIS — M62830 Muscle spasm of back: Secondary | ICD-10-CM

## 2021-07-22 DIAGNOSIS — T148XXA Other injury of unspecified body region, initial encounter: Secondary | ICD-10-CM

## 2021-07-22 MED ORDER — IBUPROFEN 800 MG PO TABS
ORAL_TABLET | Freq: Three times a day (TID) | ORAL | 3 refills | Status: DC | PRN
  Filled 2021-07-22: qty 60, 20d supply, fill #0

## 2021-07-29 ENCOUNTER — Other Ambulatory Visit: Payer: Self-pay

## 2021-08-06 NOTE — Telephone Encounter (Signed)
error 

## 2021-08-14 ENCOUNTER — Other Ambulatory Visit: Payer: Self-pay

## 2021-08-14 MED FILL — Tamsulosin HCl Cap 0.4 MG: ORAL | 30 days supply | Qty: 30 | Fill #4 | Status: AC

## 2021-08-20 ENCOUNTER — Other Ambulatory Visit: Payer: Self-pay

## 2021-08-21 ENCOUNTER — Other Ambulatory Visit: Payer: Self-pay

## 2021-08-21 ENCOUNTER — Other Ambulatory Visit: Payer: Self-pay | Admitting: Nurse Practitioner

## 2021-08-21 DIAGNOSIS — I10 Essential (primary) hypertension: Secondary | ICD-10-CM

## 2021-08-21 MED ORDER — LISINOPRIL 20 MG PO TABS
ORAL_TABLET | Freq: Every day | ORAL | 3 refills | Status: DC
  Filled 2021-08-21: qty 30, 30d supply, fill #0
  Filled 2021-09-20: qty 30, 30d supply, fill #1
  Filled 2021-10-28 – 2021-11-04 (×2): qty 30, 30d supply, fill #2
  Filled 2021-12-03: qty 30, 30d supply, fill #3
  Filled 2022-01-08: qty 30, 30d supply, fill #4
  Filled 2022-01-08: qty 30, 30d supply, fill #0
  Filled 2022-02-13: qty 30, 30d supply, fill #1
  Filled 2022-03-19: qty 30, 30d supply, fill #2
  Filled 2022-04-23: qty 30, 30d supply, fill #3
  Filled 2022-06-05: qty 30, 30d supply, fill #4
  Filled 2022-07-10: qty 30, 30d supply, fill #5

## 2021-08-23 ENCOUNTER — Other Ambulatory Visit: Payer: Self-pay

## 2021-09-20 ENCOUNTER — Other Ambulatory Visit: Payer: Self-pay | Admitting: Nurse Practitioner

## 2021-09-20 ENCOUNTER — Telehealth: Payer: Self-pay

## 2021-09-20 ENCOUNTER — Other Ambulatory Visit: Payer: Self-pay

## 2021-09-20 DIAGNOSIS — N400 Enlarged prostate without lower urinary tract symptoms: Secondary | ICD-10-CM

## 2021-09-20 DIAGNOSIS — R972 Elevated prostate specific antigen [PSA]: Secondary | ICD-10-CM

## 2021-09-20 DIAGNOSIS — R339 Retention of urine, unspecified: Secondary | ICD-10-CM

## 2021-09-20 MED ORDER — TAMSULOSIN HCL 0.4 MG PO CAPS
ORAL_CAPSULE | Freq: Every day | ORAL | 11 refills | Status: DC
  Filled 2021-09-20: qty 30, 30d supply, fill #0
  Filled 2021-10-31 – 2021-11-04 (×2): qty 30, 30d supply, fill #1
  Filled 2021-12-03: qty 30, 30d supply, fill #2
  Filled 2022-01-08: qty 30, 30d supply, fill #0
  Filled 2022-01-08: qty 30, 30d supply, fill #3
  Filled 2022-02-13: qty 30, 30d supply, fill #1
  Filled 2022-03-19: qty 30, 30d supply, fill #2
  Filled 2022-04-23: qty 30, 30d supply, fill #3
  Filled 2022-06-05: qty 30, 30d supply, fill #4
  Filled 2022-07-10: qty 30, 30d supply, fill #5
  Filled 2022-08-27: qty 30, 30d supply, fill #6

## 2021-09-20 NOTE — Telephone Encounter (Signed)
-   Flomax 

## 2021-09-24 ENCOUNTER — Other Ambulatory Visit: Payer: Self-pay

## 2021-10-01 NOTE — Telephone Encounter (Signed)
Left vm for patient to call back.

## 2021-10-09 ENCOUNTER — Ambulatory Visit: Payer: Self-pay | Admitting: Nurse Practitioner

## 2021-10-28 ENCOUNTER — Other Ambulatory Visit: Payer: Self-pay

## 2021-10-31 ENCOUNTER — Other Ambulatory Visit: Payer: Self-pay

## 2021-11-04 ENCOUNTER — Other Ambulatory Visit: Payer: Self-pay

## 2021-12-03 ENCOUNTER — Other Ambulatory Visit: Payer: Self-pay

## 2021-12-04 ENCOUNTER — Ambulatory Visit: Payer: Self-pay | Admitting: Nurse Practitioner

## 2021-12-04 ENCOUNTER — Other Ambulatory Visit: Payer: Self-pay

## 2022-01-08 ENCOUNTER — Other Ambulatory Visit: Payer: Self-pay

## 2022-01-10 ENCOUNTER — Other Ambulatory Visit: Payer: Self-pay

## 2022-02-07 ENCOUNTER — Other Ambulatory Visit: Payer: Self-pay

## 2022-02-07 ENCOUNTER — Ambulatory Visit (INDEPENDENT_AMBULATORY_CARE_PROVIDER_SITE_OTHER): Payer: Self-pay | Admitting: Nurse Practitioner

## 2022-02-07 ENCOUNTER — Encounter: Payer: Self-pay | Admitting: Nurse Practitioner

## 2022-02-07 VITALS — BP 140/90 | HR 85 | Temp 97.4°F | Ht 71.0 in | Wt 294.1 lb

## 2022-02-07 DIAGNOSIS — T148XXA Other injury of unspecified body region, initial encounter: Secondary | ICD-10-CM

## 2022-02-07 DIAGNOSIS — G4733 Obstructive sleep apnea (adult) (pediatric): Secondary | ICD-10-CM

## 2022-02-07 DIAGNOSIS — C61 Malignant neoplasm of prostate: Secondary | ICD-10-CM

## 2022-02-07 DIAGNOSIS — Z8249 Family history of ischemic heart disease and other diseases of the circulatory system: Secondary | ICD-10-CM

## 2022-02-07 DIAGNOSIS — Z13220 Encounter for screening for lipoid disorders: Secondary | ICD-10-CM

## 2022-02-07 DIAGNOSIS — M62830 Muscle spasm of back: Secondary | ICD-10-CM

## 2022-02-07 DIAGNOSIS — I10 Essential (primary) hypertension: Secondary | ICD-10-CM

## 2022-02-07 MED ORDER — IBUPROFEN 800 MG PO TABS
ORAL_TABLET | Freq: Three times a day (TID) | ORAL | 3 refills | Status: AC | PRN
  Filled 2022-02-07: qty 60, 20d supply, fill #0
  Filled 2022-03-04: qty 60, 20d supply, fill #1
  Filled 2022-04-09: qty 60, 20d supply, fill #2
  Filled 2022-05-08: qty 60, 20d supply, fill #3

## 2022-02-07 MED ORDER — CYCLOBENZAPRINE HCL 10 MG PO TABS
10.0000 mg | ORAL_TABLET | Freq: Three times a day (TID) | ORAL | 3 refills | Status: DC | PRN
  Filled 2022-02-07: qty 60, 20d supply, fill #0
  Filled 2022-05-23: qty 60, 20d supply, fill #1
  Filled 2022-11-26: qty 60, 20d supply, fill #2

## 2022-02-07 MED ORDER — HYDROCHLOROTHIAZIDE 25 MG PO TABS
ORAL_TABLET | ORAL | 3 refills | Status: DC
  Filled 2022-02-07: qty 45, 90d supply, fill #0
  Filled 2022-04-09: qty 45, 90d supply, fill #1
  Filled 2022-07-10: qty 45, 90d supply, fill #2
  Filled 2022-10-06: qty 45, 90d supply, fill #3

## 2022-02-07 NOTE — Progress Notes (Signed)
[place], [place]  [address] Phone:  [phone]   Fax:  [phone]    Established Patient Office Visit  Subjective:  Patient ID: Craig Collins, male    DOB: Apr 08, 1966  Age: 56 y.o. MRN: [id number]  CC:  Chief Complaint  Patient presents with   Follow-up    Pt is here for 6 month follow up visit. Pt would like a refill on ibuprofen and muscle relaxer    HPI Craig Collins presents for follow up.He . has a past medical history of Benign prostate hyperplasia, Elevated PSA, Erectile dysfunction, Hypertension, Obesity (BMI 35.0-39.9 without comorbidity), Prostate cancer ([place]) (05/2019), Sleep apnea, and Urinary retention.   Craig Collins is in today for follow up for Hypertension. The current prescribed treatment is lisinopril 20 mg daily and HCTZ 25 mg 1/2 tab daily. Compliance is reported. He has been out of the HCTZ for one week despite having additional refills. The  DASH diet is being followed. An exercise regimen not ongoing. There is a goal to continue to work on his weight and be healthy as possible. Denies headache, dizziness, visual changes, shortness of breath, dyspnea on exertion, chest pain, nausea, vomiting or any edema. He reports a strong family history of heart disease. His mother and 2 brothers died from heart attacks at a young age; 59.   He continues to follow up with urology for prostate cancer treatments. He gets q 6 month hormonal injections. He endorses hot flashes and gynecomasty. He desires to have surgery.     Past Medical History:  Diagnosis Date   Benign prostate hyperplasia    Elevated PSA    Erectile dysfunction    Hypertension    Obesity (BMI 35.0-39.9 without comorbidity)    Prostate cancer ([place]) 05/2019   Sleep apnea    Urinary retention     Past Surgical History:  Procedure Laterality Date   COLONOSCOPY N/A 10/14/2017   Procedure: COLONOSCOPY;  Surgeon: Gatha Mayer, MD;  Location: [facility];  Service: Endoscopy;  Laterality: N/A;    Family History  Problem Relation Age of Onset   Hypertension Other    Hypertension Brother    Colon cancer Neg Hx     Social History   Socioeconomic History   Marital status: Married    Spouse name: Not on file   Number of children: Not on file   Years of education: Not on file   Highest education level: Not on file  Occupational History   Not on file  Tobacco Use   Smoking status: Never   Smokeless tobacco: Never  Vaping Use   Vaping Use: Never used  Substance and Sexual Activity   Alcohol use: Yes    Comment: occ   Drug use: No   Sexual activity: Yes    Birth control/protection: Condom  Other Topics Concern   Not on file  Social History Narrative   Not on file   Social Determinants of Health   Financial Resource Strain: Not on file  Food Insecurity: Not on file  Transportation Needs: Not on file  Physical Activity: Not on file  Stress: Not on file  Social Connections: Not on file  Intimate Partner Violence: Not on file    Outpatient Medications Prior to Visit  Medication Sig Dispense Refill   aspirin EC 81 MG tablet Take 81 mg by mouth daily.     Calcium Carbonate-Vitamin D (CALCIUM 500/D) 500-125 MG-UNIT TABS  Take by mouth.     lisinopril (ZESTRIL) 20 MG tablet TAKE 1 TABLET (20 MG TOTAL) BY MOUTH DAILY. 90 tablet 3   tamsulosin (FLOMAX) 0.4 MG CAPS capsule TAKE 1 CAPSULE (0.4 MG TOTAL) BY MOUTH DAILY. 30 capsule 11   vitamin B-12 1000 MCG tablet Take 1 tablet (1,000 mcg total) by mouth daily. 30 tablet 0   hydrochlorothiazide (HYDRODIURIL) 25 MG tablet TAKE 1/2 TABLET (12.5 MG TOTAL) BY MOUTH DAILY. 45 tablet 3   ibuprofen (ADVIL) 800 MG tablet TAKE 1 TABLET (800 MG TOTAL) BY MOUTH EVERY 8 (EIGHT) HOURS AS NEEDED. 60 tablet 3   ERLEADA 60 MG tablet Take 240 mg by mouth daily. (Patient not taking: Reported on 02/07/2022)     NON FORMULARY Take 3 tablets by mouth daily. NUGENIX     No  facility-administered medications prior to visit.    No Known Allergies  ROS Review of Systems    Objective:    Physical Exam Constitutional:      Appearance: He is obese.  HENT:     Head: Normocephalic.  Cardiovascular:     Rate and Rhythm: Normal rate and regular rhythm.     Pulses: Normal pulses.     Heart sounds: Normal heart sounds.  Pulmonary:     Effort: Pulmonary effort is normal.     Breath sounds: Normal breath sounds.  Abdominal:     Palpations: Abdomen is soft.  Musculoskeletal:        General: Normal range of motion.     Cervical back: Normal range of motion.     Right lower leg: No edema.     Left lower leg: No edema.  Skin:    General: Skin is warm and dry.     Capillary Refill: Capillary refill takes less than 2 seconds.  Neurological:     General: No focal deficit present.     Mental Status: He is alert and oriented to person, place, and time.  Psychiatric:        Mood and Affect: Mood normal.        Behavior: Behavior normal.        Thought Content: Thought content normal.        Judgment: Judgment normal.    BP 140/90    Pulse 85    Temp (!) 97.4 F (36.3 C)    Ht '5\' 11"'  (1.803 m)    Wt 294 lb 0.8 oz (133.4 kg)    SpO2 99%    BMI 41.01 kg/m  Wt Readings from Last 3 Encounters:  02/07/22 294 lb 0.8 oz (133.4 kg)  04/08/21 (!) 304 lb (137.9 kg)  10/08/20 291 lb 3.2 oz (132.1 kg)     There are no preventive care reminders to display for this patient.   There are no preventive care reminders to display for this patient.  Lab Results  Component Value Date   TSH 1.580 06/18/2020   Lab Results  Component Value Date   WBC 4.5 06/18/2020   HGB 12.5 (L) 06/18/2020   HCT 36.7 (L) 06/18/2020   MCV 83 06/18/2020   PLT 242 06/18/2020   Lab Results  Component Value Date   NA 140 04/08/2021   K 4.2 04/08/2021   CO2 21 06/18/2020   GLUCOSE 95 04/08/2021   BUN 15 04/08/2021   CREATININE 1.41 (H) 04/08/2021   BILITOT 0.3 04/08/2021    ALKPHOS 103 04/08/2021   AST 12 04/08/2021   ALT 10 06/18/2020  PROT 7.8 04/08/2021   ALBUMIN 4.3 04/08/2021   CALCIUM 9.7 04/08/2021   ANIONGAP 6 10/14/2017   EGFR 59 (L) 04/08/2021   Lab Results  Component Value Date   CHOL 220 (H) 04/08/2021   Lab Results  Component Value Date   HDL 48 04/08/2021   Lab Results  Component Value Date   LDLCALC 156 (H) 04/08/2021   Lab Results  Component Value Date   TRIG 92 04/08/2021   Lab Results  Component Value Date   CHOLHDL 4.6 04/08/2021   Lab Results  Component Value Date   HGBA1C 5.0 10/28/2017      Assessment & Plan:   Problem List Items Addressed This Visit       Cardiovascular and Mediastinum   Hypertension, essential - Primary Stable  Encouraged on going compliance with current medication regimen Encouraged home monitoring and recording BP <130/80 Eating a heart-healthy diet with less salt Encouraged regular physical activity  Recommend Weight loss     Relevant Medications   hydrochlorothiazide (HYDRODIURIL) 25 MG tablet   Other Relevant Orders   Comp. Metabolic Panel (12)     Musculoskeletal and Integument   Muscle strain Persistent  Continue cyclobenzaprine, [employer] and APAP prn    Relevant Medications   ibuprofen (ADVIL) 800 MG tablet     Genitourinary   Prostate cancer ([facility]) Persistent follow up with Urology and current treatment options   Other Visit Diagnoses     OSA (obstructive sleep apnea)    Persistent    Muscle spasm of back       Relevant Medications   ibuprofen (ADVIL) 800 MG tablet   cyclobenzaprine (FLEXERIL) 10 MG tablet   Screening for cholesterol level       Relevant Orders   Lipid panel   Family history of cardiac disorder       Relevant Orders   Ambulatory referral to Cardiology   Family history of heart attack       Relevant Orders   Ambulatory referral to Cardiology       Meds ordered this encounter  Medications   hydrochlorothiazide (HYDRODIURIL) 25 MG tablet     Sig: TAKE 1/2 TABLET (12.5 MG TOTAL) BY MOUTH DAILY.    Dispense:  45 tablet    Refill:  3    Order Specific Question:   Supervising Provider    Answer:   Tresa Garter [[id number]]   ibuprofen (ADVIL) 800 MG tablet    Sig: TAKE 1 TABLET (800 MG TOTAL) BY MOUTH EVERY 8 (EIGHT) HOURS AS NEEDED.    Dispense:  60 tablet    Refill:  3    Order Specific Question:   Supervising Provider    Answer:   Tresa Garter [[id number]]   cyclobenzaprine (FLEXERIL) 10 MG tablet    Sig: Take 1 tablet (10 mg total) by mouth 3 (three) times daily as needed for muscle spasms.    Dispense:  60 tablet    Refill:  3    Order Specific Question:   Supervising Provider    Answer:   Tresa Garter [id number]    Follow-up: Return in about 6 months (around 08/07/2022) for Follow up HTN [id number].    Vevelyn Francois, NP

## 2022-02-07 NOTE — Patient Instructions (Signed)
Managing Your Hypertension Hypertension, also called high blood pressure, is when the force of the blood pressing against the walls of the arteries is too strong. Arteries are blood vessels that carry blood from your heart throughout your body. Hypertension forces the heart to work harder to pump blood and may cause the arteries to become narrow or stiff. Understanding blood pressure readings Your personal target blood pressure may vary depending on your medical conditions, your age, and other factors. A blood pressure reading includes a higher number over a lower number. Ideally, your blood pressure should be below 120/80. You should know that: The first, or top, number is called the systolic pressure. It is a measure of the pressure in your arteries as your heart beats. The second, or bottom number, is called the diastolic pressure. It is a measure of the pressure in your arteries as the heart relaxes. Blood pressure is classified into four stages. Based on your blood pressure reading, your health care provider may use the following stages to determine what type of treatment you need, if any. Systolic pressure and diastolic pressure are measured in a unit called mmHg. Normal Systolic pressure: below 120. Diastolic pressure: below 80. Elevated Systolic pressure: 120-129. Diastolic pressure: below 80. Hypertension stage 1 Systolic pressure: 130-139. Diastolic pressure: 80-89. Hypertension stage 2 Systolic pressure: 140 or above. Diastolic pressure: 90 or above. How can this condition affect me? Managing your hypertension is an important responsibility. Over time, hypertension can damage the arteries and decrease blood flow to important parts of the body, including the brain, heart, and kidneys. Having untreated or uncontrolled hypertension can lead to: A heart attack. A stroke. A weakened blood vessel (aneurysm). Heart failure. Kidney damage. Eye damage. Metabolic syndrome. Memory and  concentration problems. Vascular dementia. What actions can I take to manage this condition? Hypertension can be managed by making lifestyle changes and possibly by taking medicines. Your health care provider will help you make a plan to bring your blood pressure within a normal range. Nutrition  Eat a diet that is high in fiber and potassium, and low in salt (sodium), added sugar, and fat. An example eating plan is called the Dietary Approaches to Stop Hypertension (DASH) diet. To eat this way: Eat plenty of fresh fruits and vegetables. Try to fill one-half of your plate at each meal with fruits and vegetables. Eat whole grains, such as whole-wheat pasta, brown rice, or whole-grain bread. Fill about one-fourth of your plate with whole grains. Eat low-fat dairy products. Avoid fatty cuts of meat, processed or cured meats, and poultry with skin. Fill about one-fourth of your plate with lean proteins such as fish, chicken without skin, beans, eggs, and tofu. Avoid pre-made and processed foods. These tend to be higher in sodium, added sugar, and fat. Reduce your daily sodium intake. Most people with hypertension should eat less than 1,500 mg of sodium a day. Lifestyle  Work with your health care provider to maintain a healthy body weight or to lose weight. Ask what an ideal weight is for you. Get at least 30 minutes of exercise that causes your heart to beat faster (aerobic exercise) most days of the week. Activities may include walking, swimming, or biking. Include exercise to strengthen your muscles (resistance exercise), such as weight lifting, as part of your weekly exercise routine. Try to do these types of exercises for 30 minutes at least 3 days a week. Do not use any products that contain nicotine or tobacco, such as cigarettes, e-cigarettes,   and chewing tobacco. If you need help quitting, ask your health care provider. Control any long-term (chronic) conditions you have, such as high  cholesterol or diabetes. Identify your sources of stress and find ways to manage stress. This may include meditation, deep breathing, or making time for fun activities. Alcohol use Do not drink alcohol if: Your health care provider tells you not to drink. You are pregnant, may be pregnant, or are planning to become pregnant. If you drink alcohol: Limit how much you use to: 0-1 drink a day for women. 0-2 drinks a day for men. Be aware of how much alcohol is in your drink. In the [place] one drink equals one 12 oz bottle of beer (355 mL), one 5 oz glass of wine (148 mL), or one 1 oz glass of hard liquor (44 mL). Medicines Your health care provider may prescribe medicine if lifestyle changes are not enough to get your blood pressure under control and if: Your systolic blood pressure is 130 or higher. Your diastolic blood pressure is 80 or higher. Take medicines only as told by your health care provider. Follow the directions carefully. Blood pressure medicines must be taken as told by your health care provider. The medicine does not work as well when you skip doses. Skipping doses also puts you at risk for problems. Monitoring Before you monitor your blood pressure: Do not smoke, drink caffeinated beverages, or exercise within 30 minutes before taking a measurement. Use the bathroom and empty your bladder (urinate). Sit quietly for at least 5 minutes before taking measurements. Monitor your blood pressure at home as told by your health care provider. To do this: Sit with your back straight and supported. Place your feet flat on the floor. Do not cross your legs. Support your arm on a flat surface, such as a table. Make sure your upper arm is at heart level. Each time you measure, take two or three readings one minute apart and record the results. You may also need to have your blood pressure checked regularly by your health care provider. General information Talk with your health care  provider about your diet, exercise habits, and other lifestyle factors that may be contributing to hypertension. Review all the medicines you take with your health care provider because there may be side effects or interactions. Keep all visits as told by your health care provider. Your health care provider can help you create and adjust your plan for managing your high blood pressure. Where to find more information National Heart, Lung, and Blood Institute: www.nhlbi.nih.gov American Heart Association: www.heart.org Contact a health care provider if: You think you are having a reaction to medicines you have taken. You have repeated (recurrent) headaches. You feel dizzy. You have swelling in your ankles. You have trouble with your vision. Get help right away if: You develop a severe headache or confusion. You have unusual weakness or numbness, or you feel faint. You have severe pain in your chest or abdomen. You vomit repeatedly. You have trouble breathing. These symptoms may represent a serious problem that is an emergency. Do not wait to see if the symptoms will go away. Get medical help right away. Call your local emergency services (911 in the [place] Do not drive yourself to the hospital. Summary Hypertension is when the force of blood pumping through your arteries is too strong. If this condition is not controlled, it may put you at risk for serious complications. Your personal target blood pressure may vary depending on   your medical conditions, your age, and other factors. For most people, a normal blood pressure is less than 120/80. Hypertension is managed by lifestyle changes, medicines, or both. Lifestyle changes to help manage hypertension include losing weight, eating a healthy, low-sodium diet, exercising more, stopping smoking, and limiting alcohol. This information is not intended to replace advice given to you by your health care provider. Make sure you discuss any questions  you have with your health care provider. Document Revised: 12/26/2019 Document Reviewed: 11/08/2019 Elsevier Patient Education  2022 Elsevier Inc.  

## 2022-02-08 ENCOUNTER — Other Ambulatory Visit: Payer: Self-pay | Admitting: Nurse Practitioner

## 2022-02-08 ENCOUNTER — Encounter: Payer: Self-pay | Admitting: Nurse Practitioner

## 2022-02-08 DIAGNOSIS — N1831 Chronic kidney disease, stage 3a: Secondary | ICD-10-CM | POA: Insufficient documentation

## 2022-02-08 DIAGNOSIS — N183 Chronic kidney disease, stage 3 unspecified: Secondary | ICD-10-CM | POA: Insufficient documentation

## 2022-02-08 LAB — LIPID PANEL
Chol/HDL Ratio: 4.5 ratio (ref 0.0–5.0)
Cholesterol, Total: 190 mg/dL (ref 100–199)
HDL: 42 mg/dL (ref >39)
LDL Chol Calc (NIH): 128 mg/dL — ABNORMAL HIGH (ref 0–99)
Triglycerides: 111 mg/dL (ref 0–149)
VLDL Cholesterol Cal: 20 mg/dL (ref 5–40)

## 2022-02-08 LAB — COMP. METABOLIC PANEL (12)
AST: 11 IU/L (ref 0–40)
Albumin/Globulin Ratio: 1.3 (ref 1.2–2.2)
Albumin: 4.3 g/dL (ref 3.8–4.9)
Alkaline Phosphatase: 339 IU/L — ABNORMAL HIGH (ref 44–121)
BUN/Creatinine Ratio: 10 (ref 9–20)
BUN: 15 mg/dL (ref 6–24)
Bilirubin Total: 0.3 mg/dL (ref 0.0–1.2)
Calcium: 9.6 mg/dL (ref 8.7–10.2)
Chloride: 106 mmol/L (ref 96–106)
Creatinine, Ser: 1.49 mg/dL — ABNORMAL HIGH (ref 0.76–1.27)
Globulin, Total: 3.2 g/dL (ref 1.5–4.5)
Glucose: 96 mg/dL (ref 70–99)
Potassium: 4.4 mmol/L (ref 3.5–5.2)
Sodium: 141 mmol/L (ref 134–144)
Total Protein: 7.5 g/dL (ref 6.0–8.5)
eGFR: 55 mL/min/{1.73_m2} — ABNORMAL LOW (ref >59)

## 2022-02-08 NOTE — Progress Notes (Signed)
   [facility] [address] [place], [place]  [address] Phone:  [phone]   Fax:  [phone] 

## 2022-02-10 ENCOUNTER — Other Ambulatory Visit: Payer: Self-pay

## 2022-02-13 ENCOUNTER — Other Ambulatory Visit: Payer: Self-pay

## 2022-02-19 ENCOUNTER — Other Ambulatory Visit: Payer: Self-pay | Admitting: Nurse Practitioner

## 2022-02-19 DIAGNOSIS — N1831 Chronic kidney disease, stage 3a: Secondary | ICD-10-CM

## 2022-02-19 NOTE — Progress Notes (Signed)
   [facility] [address] [place], [place]  [address] Phone:  [phone]   Fax:  [phone] 

## 2022-03-04 ENCOUNTER — Other Ambulatory Visit: Payer: Self-pay

## 2022-03-06 ENCOUNTER — Other Ambulatory Visit: Payer: Self-pay

## 2022-03-06 ENCOUNTER — Ambulatory Visit: Payer: Self-pay | Admitting: Interventional Cardiology

## 2022-03-19 ENCOUNTER — Other Ambulatory Visit: Payer: Self-pay

## 2022-03-20 ENCOUNTER — Other Ambulatory Visit: Payer: Self-pay

## 2022-04-09 ENCOUNTER — Other Ambulatory Visit: Payer: Self-pay

## 2022-04-11 ENCOUNTER — Other Ambulatory Visit: Payer: Self-pay

## 2022-04-23 ENCOUNTER — Other Ambulatory Visit: Payer: Self-pay

## 2022-04-25 ENCOUNTER — Other Ambulatory Visit: Payer: Self-pay

## 2022-05-08 ENCOUNTER — Other Ambulatory Visit: Payer: Self-pay

## 2022-05-09 ENCOUNTER — Other Ambulatory Visit: Payer: Self-pay

## 2022-05-23 ENCOUNTER — Other Ambulatory Visit: Payer: Self-pay

## 2022-05-28 DIAGNOSIS — C7951 Secondary malignant neoplasm of bone: Secondary | ICD-10-CM | POA: Diagnosis not present

## 2022-05-28 DIAGNOSIS — C778 Secondary and unspecified malignant neoplasm of lymph nodes of multiple regions: Secondary | ICD-10-CM | POA: Diagnosis not present

## 2022-05-28 DIAGNOSIS — C61 Malignant neoplasm of prostate: Secondary | ICD-10-CM | POA: Diagnosis not present

## 2022-06-02 ENCOUNTER — Other Ambulatory Visit (HOSPITAL_COMMUNITY): Payer: Self-pay | Admitting: Urology

## 2022-06-02 DIAGNOSIS — C61 Malignant neoplasm of prostate: Secondary | ICD-10-CM

## 2022-06-05 ENCOUNTER — Other Ambulatory Visit: Payer: Self-pay

## 2022-06-06 ENCOUNTER — Other Ambulatory Visit: Payer: Self-pay

## 2022-06-10 ENCOUNTER — Ambulatory Visit (HOSPITAL_COMMUNITY)
Admission: RE | Admit: 2022-06-10 | Discharge: 2022-06-10 | Disposition: A | Discharge status: Home / Self Care | Payer: Medicaid Other | Source: Ambulatory Visit | Attending: Urology | Admitting: Urology

## 2022-06-10 DIAGNOSIS — C61 Malignant neoplasm of prostate: Secondary | ICD-10-CM | POA: Diagnosis not present

## 2022-06-10 MED ORDER — PIFLIFOLASTAT F 18 (PYLARIFY) INJECTION
9.0000 | Freq: Once | INTRAVENOUS | Status: AC
  Administered 2022-06-10: 9 via INTRAVENOUS

## 2022-06-18 ENCOUNTER — Telehealth: Payer: Self-pay | Admitting: Oncology

## 2022-06-18 NOTE — Telephone Encounter (Signed)
Scheduled appt per 6/27 referral. Pt is aware of appt date and time. Pt is aware to arrive 15 mins prior to appt time and to bring and updated insurance card. Pt is aware of appt location.   

## 2022-07-08 ENCOUNTER — Other Ambulatory Visit: Payer: Self-pay

## 2022-07-08 ENCOUNTER — Telehealth: Payer: Self-pay | Admitting: Pharmacy Technician

## 2022-07-08 ENCOUNTER — Other Ambulatory Visit: Payer: Self-pay | Admitting: Oncology

## 2022-07-08 ENCOUNTER — Other Ambulatory Visit (HOSPITAL_COMMUNITY): Payer: Self-pay

## 2022-07-08 ENCOUNTER — Inpatient Hospital Stay: Payer: Self-pay | Attending: Oncology | Admitting: Oncology

## 2022-07-08 ENCOUNTER — Telehealth: Payer: Self-pay

## 2022-07-08 VITALS — BP 129/97 | HR 86 | Temp 97.5°F | Resp 16 | Wt 265.8 lb

## 2022-07-08 DIAGNOSIS — C772 Secondary and unspecified malignant neoplasm of intra-abdominal lymph nodes: Secondary | ICD-10-CM

## 2022-07-08 DIAGNOSIS — E669 Obesity, unspecified: Secondary | ICD-10-CM

## 2022-07-08 DIAGNOSIS — C61 Malignant neoplasm of prostate: Secondary | ICD-10-CM

## 2022-07-08 DIAGNOSIS — C7951 Secondary malignant neoplasm of bone: Secondary | ICD-10-CM

## 2022-07-08 DIAGNOSIS — Z79899 Other long term (current) drug therapy: Secondary | ICD-10-CM

## 2022-07-08 DIAGNOSIS — Z192 Hormone resistant malignancy status: Secondary | ICD-10-CM | POA: Insufficient documentation

## 2022-07-08 DIAGNOSIS — I10 Essential (primary) hypertension: Secondary | ICD-10-CM

## 2022-07-08 MED ORDER — ERLEADA 240 MG PO TABS
240.0000 mg | ORAL_TABLET | Freq: Every day | ORAL | 1 refills | Status: DC
  Filled 2022-07-08: qty 120, fill #0

## 2022-07-08 MED ORDER — XTANDI 40 MG PO CAPS
160.0000 mg | ORAL_CAPSULE | Freq: Every day | ORAL | 1 refills | Status: DC
  Filled 2022-07-08: qty 120, 30d supply, fill #0

## 2022-07-08 MED ORDER — XTANDI 40 MG PO CAPS: 160.0000 mg | ORAL_CAPSULE | Freq: Every day | ORAL | 1 refills | Status: DC

## 2022-07-08 NOTE — Progress Notes (Signed)
Reason for the request:    Prostate cancer  HPI: I was asked by Dr. Dr. Junious Silk to evaluate Craig Collins for a prostate cancer.  He is a 56 year old male with diagnosed with prostate cancer in 2020 after presenting with a PSA of 42 and a biopsy at that time showed a Gleason score of 4+5 = 9.  He was found to have advanced disease with pelvic adenopathy and possibly bone disease.  He was started on androgen deprivation therapy initially under the care of Dr. Karsten Ro.  Follow-up CT scan February 2021 showed decrease in his retroperitoneal lymph node.  His PSA dropped to 2.77 in November 2021.  PSA started to rise and February 2022 up to 5.87.  In November 2022 his PSA was 99.3 and testosterone level of 19.3.  He has failed to follow-up periodically and to reestablish care with Dr. Junious Silk. PSA on May 28, 2022 was 22.1 and PSMA PET scan obtained on June 10, 2022 showed intense uptake within the prostate gland and extension into the seminal vesicle.  Bulky metastatic disease in the pelvis, periaortic and retroperitoneal lymphadenopathy.  There is extensive uptake associated with skeletal metastasis in the axillary or appendicular skeleton.  He was prescribed apalutamide in the past but was not able to get it for insurance reasons.  He is currently on Eligard which currently ongoing.  Clinically, he reported a left supraclavicular lymph node enlargement as well as left-sided chest wall and rib cage pain.  Otherwise no other complaints at this time.  Denies any nausea, vomiting or weight loss.  Performance status remained excellent.     He does not report any headaches, blurry vision, syncope or seizures. Does not report any fevers, chills or sweats.  Does not report any cough, wheezing or hemoptysis.  Does not report any chest pain, palpitation, orthopnea or leg edema.  Does not report any nausea, vomiting or abdominal pain.  Does not report any constipation or diarrhea.  Does not report any skeletal  complaints.    Does not report frequency, urgency or hematuria.  Does not report any skin rashes or lesions. Does not report any heat or cold intolerance.  Does not report any lymphadenopathy or petechiae.  Does not report any anxiety or depression.  Remaining review of systems is negative.     Past Medical History:  Diagnosis Date   Benign prostate hyperplasia    Elevated PSA    Erectile dysfunction    Hypertension    Obesity (BMI 35.0-39.9 without comorbidity)    Prostate cancer ([place]) 05/2019   Sleep apnea    Urinary retention   :   Past Surgical History:  Procedure Laterality Date   COLONOSCOPY N/A 10/14/2017   Procedure: COLONOSCOPY;  Surgeon: Gatha Mayer, MD;  Location: [facility] ENDOSCOPY;  Service: Endoscopy;  Laterality: N/A;  :   Current Outpatient Medications:    aspirin EC 81 MG tablet, Take 81 mg by mouth daily., Disp: , Rfl:    Calcium Carbonate-Vitamin D (CALCIUM 500/D) 500-125 MG-UNIT TABS, Take by mouth., Disp: , Rfl:    cyclobenzaprine (FLEXERIL) 10 MG tablet, Take 1 tablet (10 mg total) by mouth 3 (three) times daily as needed for muscle spasms., Disp: 60 tablet, Rfl: 3   ERLEADA 60 MG tablet, Take 240 mg by mouth daily. (Patient not taking: Reported on 02/07/2022), Disp: , Rfl:    hydrochlorothiazide (HYDRODIURIL) 25 MG tablet, TAKE 1/2 TABLET (12.5 MG TOTAL) BY MOUTH DAILY., Disp: 45 tablet, Rfl: 3   ibuprofen (  ADVIL) 800 MG tablet, TAKE 1 TABLET (800 MG TOTAL) BY MOUTH EVERY 8 (EIGHT) HOURS AS NEEDED., Disp: 60 tablet, Rfl: 3   lisinopril (ZESTRIL) 20 MG tablet, TAKE 1 TABLET (20 MG TOTAL) BY MOUTH DAILY., Disp: 90 tablet, Rfl: 3   NON FORMULARY, Take 3 tablets by mouth daily. NUGENIX, Disp: , Rfl:    tamsulosin (FLOMAX) 0.4 MG CAPS capsule, TAKE 1 CAPSULE (0.4 MG TOTAL) BY MOUTH DAILY., Disp: 30 capsule, Rfl: 11   vitamin B-12 1000 MCG tablet, Take 1 tablet (1,000 mcg total) by mouth daily., Disp: 30 tablet, Rfl: 0:  No Known Allergies:   Family History   Problem Relation Age of Onset   Hypertension Other    Hypertension Brother    Colon cancer Neg Hx   :   Social History   Socioeconomic History   Marital status: Married    Spouse name: Not on file   Number of children: Not on file   Years of education: Not on file   Highest education level: Not on file  Occupational History   Not on file  Tobacco Use   Smoking status: Never   Smokeless tobacco: Never  Vaping Use   Vaping Use: Never used  Substance and Sexual Activity   Alcohol use: Yes    Comment: occ   Drug use: No   Sexual activity: Yes    Birth control/protection: Condom  Other Topics Concern   Not on file  Social History Narrative   Not on file   Social Determinants of Health   Financial Resource Strain: Not on file  Food Insecurity: Not on file  Transportation Needs: Not on file  Physical Activity: Not on file  Stress: Not on file  Social Connections: Not on file  Intimate Partner Violence: Not on file  :  Pertinent items are noted in HPI.  Exam: Blood pressure (!) 129/97, pulse 86, temperature (!) 97.5 F (36.4 C), temperature source Temporal, resp. rate 16, weight 265 lb 12.8 oz (120.6 kg), SpO2 99 %. ECOG 1 General appearance: alert and cooperative appeared without distress. Head: atraumatic without any abnormalities. Eyes: conjunctivae/corneas clear. PERRL.  Sclera anicteric. Throat: lips, mucosa, and tongue normal; without oral thrush or ulcers. Resp: clear to auscultation bilaterally without rhonchi, wheezes or dullness to percussion. Cardio: regular rate and rhythm, S1, S2 normal, no murmur, click, rub or gallop GI: soft, non-tender; bowel sounds normal; no masses,  no organomegaly Skin: Skin color, texture, turgor normal. No rashes or lesions Lymph nodes: Cervical, supraclavicular, and axillary nodes normal. Neurologic: Grossly normal without any motor, sensory or deep tendon reflexes. Musculoskeletal: No joint deformity or  effusion.    NM PET (PSMA) SKULL TO MID THIGH  Result Date: 06/11/2022 CLINICAL DATA:  Prostate cancer with nodal metastasis and skeletal metastasis. Diagnosis 2018. EXAM: NUCLEAR MEDICINE PET SKULL BASE TO THIGH TECHNIQUE: 9.0 mCi F18 Piflufolastat (Pylarify) was injected intravenously. Full-ring PET imaging was performed from the skull base to thigh after the radiotracer. CT data was obtained and used for attenuation correction and anatomic localization. COMPARISON:  Bone scan 01/17/2020, CT scan 01/17/2020 FINDINGS: NECK Bulky intensely radiotracer avid LEFT supraclavicular nodes. For example 3.7 cm node (image 67/4) with SUV max equal 60.4 Incidental CT finding: None CHEST Small but intensely radiotracer avid sub carina and peribronchial nodes with SUV max equal 20.4. radiotracer avid RIGHT lower paratracheal node additionally No suspicious pulmonary nodules. Incidental CT finding: Small LEFT effusion ABDOMEN/PELVIS Prostate: Intense radiotracer activity within the prostate gland  which extends into the Seminal vesicles. Large portion the gland revolved SUV max equal 28.9 Lymph nodes: Bulky bilateral intense radiotracer avid pelvic lymph nodes. The largest RIGHT iliac node measures 3.9 cm short axis with SUV max equal 72. Similar bulky nodes at the aortic bifurcation with 3.2 cm node on the RIGHT with SUV max equal 37.1. Periaortic radiotracer avid retroperitoneal nodes extend the renal veins. Radiotracer avid retrocrural node measures 16 mm with SUV max equal 31.9 Liver: No evidence of liver metastasis Incidental CT finding: None SKELETON Multiple foci of intense radiotracer activity throughout the axillary and appendicular skeleton. Lesions involve the pelvis, spine calvarium, shoulder girdles. For example broad lesion in the LEFT sacrum with SUV max equal 37.8. There is accompanying sclerotic lesion on CT portion exam. Small lesion in the RIGHT iliac wing with SUV max equal 43. Smudgy sclerotic lesion  measuring 12 mm on image 176. Radiotracer avid lesions involving the vertebral bodies and posterior elements of the upper thoracic spine. Activity extends into the ribs. Expansile lesion associated with the seventh rib on the LEFT intense radiotracer activity (SUV max equal 65 on image 99). RIGHT calvarial lesion present on image 20. IMPRESSION: 1. Intense radiotracer activity within the prostate gland with extension into the Seminal vesicles consistent with residual prostate adenocarcinoma. 2. Bulky intensely radiotracer avid prostate cancer nodal metastasis within the pelvis, periaortic retroperitoneum and retrocrural space. 3. Metastatic adenopathy in the mediastinum and bulky metastatic nodes in the LEFT supraclavicular nodal station. 4. Extensive radiotracer avid skeletal metastasis in the axillary appendicular skeleton with associated sclerotic change on CT portion exam Electronically Signed   By: Suzy Bouchard M.D.   On: 06/11/2022 16:55    Assessment and Plan:    56 year old with:  1.  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  He initially presented in 2020 with Gleason score 9 and PSA 42 and advanced disease including lymphadenopathy.  He has progressed on androgen deprivation therapy alone.  The natural course of this disease and treatment options were discussed at this time.  Any treatment at this time would be palliative and would not offer a cure.  His performance status remain excellent and aggressive measures are warranted at this time.  Treatment options including Taxotere chemotherapy, androgen receptor pathway inhibitors, PARP inhibitor among others.  At this time, it would be reasonable to proceed with androgen receptor pathway inhibitor and use as systemic chemotherapy as a salvage option.  PARP inhibitor could be also utilized if he harbors the appropriate mutation.  Complication associated with apalutamide or enzalutamide were  discussed today.  These complications including hypertension fatigue, urinary complaints, GI toxicity and rarely seizures associated with enzalutamide.  After discussion he is agreeable to proceed.  2.  Supraclavicular adenopathy and bone pain: We will refer to radiation oncology for palliative course of radiation which help control his symptoms for the time being.  3.  Bone directed therapy: He is currently on calcium and vitamin D supplements.  I recommend Xgeva after obtaining dental clearance.  4.  Prognosis and goals of care: Therapy is palliative at this time although aggressive measures are warranted given his excellent performance status.  His disease can still be palliated in extension.   5.  Follow-up: We will be in the near future follow his progress.   60  minutes were dedicated to this visit. The time was spent on reviewing laboratory data, imaging studies, discussing treatment options, reviewing prognosis and answering questions regarding future plan.  A copy of this consult has been forwarded to the requesting physician.

## 2022-07-08 NOTE — Progress Notes (Signed)
Introduced myself to the patient as the prostate nurse navigator.  Patient has started process applying for disability/medicaid and agreeable to social work referral for assistance.  He is here to discuss his treatment options.  I gave him my business card and asked him to call me with questions or concerns.  Verbalized understanding.

## 2022-07-08 NOTE — Telephone Encounter (Signed)
Oral Oncology Patient Advocate Encounter   Received notification that the application for assistance for Xtandi through [employer] has been approved. The patient will receive this medication at $0.    Effective dates: 07/08/2022 through 12/21/2022   Lady Deutscher, CPhT-Adv Pharmacy Patient Advocate Specialist [place] Patient Advocate Team Direct Number: [phone]  Fax: [phone]

## 2022-07-08 NOTE — Telephone Encounter (Signed)
Oral Oncology Patient Advocate Encounter   Submitted application for assistance for Xtandi through [employer].   [employer] number [phone].   I will continue to check the status until final determination.   Lady Deutscher, CPhT-Adv Pharmacy Patient Advocate Specialist [place] Patient Advocate Team Direct Number: [phone]  Fax: [phone]

## 2022-07-08 NOTE — Telephone Encounter (Signed)
Oral Oncology Pharmacist Encounter  Received new prescription for enzalutamide Gillermina Phy) for the treatment of prostate cancer, planned duration until disease progression or unacceptable toxicity.  Labs from 02/07/2022 assessed, no interventions needed. Patient has elevated creatinine although no dose reductions are warranted. Prescription dose and frequency assessed.  Current medication list in Epic reviewed, DDIs with Xtandi identified: none  Evaluated chart and no patient barriers to medication adherence noted.   Patient agreement for treatment documented in MD note on 07/08/2022.  Prescription has been e-scribed to the [facility] for benefits analysis and approval.  Oral Oncology Clinic will continue to follow for insurance authorization, copayment issues, initial counseling and start date.  Drema Halon, PharmD Hematology/Oncology Clinical Pharmacist [facility] [phone] 07/08/2022 11:43 AM

## 2022-07-08 NOTE — Progress Notes (Signed)
Radiation Oncology         [phone] ________________________________  Initial outpatient Consultation  Name: Craig Collins MRN: [id number]  Date of Service: 07/10/2022 DOB: [birth year]  VV:OHYW, Diona Foley, NP  [name], MD   REFERRING PHYSICIAN: [name], MD  DIAGNOSIS: 56 year old male with advanced, castrate sensitive metastatic prostate cancer with painful bony disease in the left chest wall and left supraclavicular nodal region.    ICD-10-CM   1. Prostate cancer ([place])  C61     2. Malignant neoplasm of prostate metastatic to bone ([place])  C61    C79.51     3. Malignant neoplasm of prostate metastatic to lymph nodes of multiple sites [facility]  C61    C77.8       HISTORY OF PRESENT ILLNESS: Craig Collins is a 56 y.o. male seen at the request of Dr. Alen Blew.  He was initially diagnosed with advanced, metastatic, Gleason 4+5 adenocarcinoma of the prostate with a PSA of 42 at the time of diagnosis in 06/11/2019 under the care of of Dr. Karsten Ro.  He was initially treated with ADT and had a good response with a PSA decreased to 2.77 in November 2021 and a restaging CT scan in February 2021 showing improved lymphadenopathy.  His PSA began to rise, at 5.87 in February 2022 and up to 99.3 in November 2022 despite a testosterone level at 19.  He has been intermittently noncompliant with follow-up but did reestablish care with Dr. Junious Silk in November 2022 when his PSA was 99.3 and he got a 24-monthADT injection at that time.  His PSA again decreased to 22.10 but he developed palpable, painful left supraclavicular lymphadenopathy.  A PSMA PET scan was performed on 06/10/2022 showing residual prostate disease with intense activity within the gland and extending into the seminal vesicles, bulky nodal metastases within the pelvis, periaortic, retroperitoneum and retrocrural space, lymphadenopathy in the mediastinum and bulky metastatic disease in the left supraclavicular nodal station as  well as extensive activity in the axial and appendicular skeleton with associated sclerotic changes on the CT portion of exam.  He had previously been prescribed Xtandi but never started the medication due to lack of insurance coverage.  More recently, he was referred to Dr. SAlen Blewfor consult on 07/08/2022 and [facility] to the painful sites of disease in the left chest wall/ribs and left supraclavicular region.  Therefore, he has been kindly referred today for discussion of radiation treatment options in the management of his disease.  PREVIOUS RADIATION THERAPY: No  PAST MEDICAL HISTORY:  Past Medical History:  Diagnosis Date   Benign prostate hyperplasia    Elevated PSA    Erectile dysfunction    Hypertension    Obesity (BMI 35.0-39.9 without comorbidity)    [place]   Sleep apnea    Urinary retention       PAST SURGICAL HISTORY: Past Surgical History:  Procedure Laterality Date   COLONOSCOPY N/A 10/14/2017   Procedure: COLONOSCOPY;  Surgeon: GGatha Mayer MD;  [facility]  Service: Endoscopy;  Laterality: N/A;    FAMILY HISTORY:  Family History  Problem Relation Age of Onset   Hypertension Other    Hypertension Brother    Colon cancer Neg Hx     SOCIAL HISTORY:  Social History   Socioeconomic History   Marital status: Married    Spouse name: Not on file   Number of children:  Not on file   Years of education: Not on file   Highest education level: Not on file  Occupational History   Not on file  Tobacco Use   Smoking status: Never   Smokeless tobacco: Never  Vaping Use   Vaping Use: Never used  Substance and Sexual Activity   Alcohol use: Yes    Comment: occ   Drug use: No   Sexual activity: Yes    Birth control/protection: Condom  Other Topics Concern   Not on file  Social History Narrative   Not on file   Social Determinants of Health   Financial Resource  Strain: Not on file  Food Insecurity: Not on file  Transportation Needs: Not on file  Physical Activity: Not on file  Stress: Not on file  Social Connections: Not on file  Intimate Partner Violence: Not on file    ALLERGIES: Patient has no known allergies.  MEDICATIONS:  Current Outpatient Medications  Medication Sig Dispense Refill   aspirin EC 81 MG tablet Take 81 mg by mouth daily.     Calcium Carbonate-Vitamin D (CALCIUM 500/D) 500-125 MG-UNIT TABS Take by mouth.     cyclobenzaprine (FLEXERIL) 10 MG tablet Take 1 tablet (10 mg total) by mouth 3 (three) times daily as needed for muscle spasms. 60 tablet 3   enzalutamide (XTANDI) 40 MG capsule Take 4 capsules (160 mg total) by mouth daily. 120 capsule 1   hydrochlorothiazide (HYDRODIURIL) 25 MG tablet TAKE 1/2 TABLET (12.5 MG TOTAL) BY MOUTH DAILY. 45 tablet 3   ibuprofen (ADVIL) 800 MG tablet TAKE 1 TABLET (800 MG TOTAL) BY MOUTH EVERY 8 (EIGHT) HOURS AS NEEDED. 60 tablet 3   lisinopril (ZESTRIL) 20 MG tablet TAKE 1 TABLET (20 MG TOTAL) BY MOUTH DAILY. 90 tablet 3   NON FORMULARY Take 3 tablets by mouth daily. NUGENIX     tamsulosin (FLOMAX) 0.4 MG CAPS capsule TAKE 1 CAPSULE (0.4 MG TOTAL) BY MOUTH DAILY. 30 capsule 11   vitamin B-12 1000 MCG tablet Take 1 tablet (1,000 mcg total) by mouth daily. 30 tablet 0   No current facility-administered medications for this visit.    REVIEW OF SYSTEMS:  On review of systems, the patient reports that he is doing well overall.  He denies any chest pain, shortness of breath, cough, fevers, chills, night sweats, unintended weight changes.  He denies any bowel or bladder disturbances, and denies abdominal pain, nausea or vomiting.  Aside from the left chest wall/rib and left supraclavicular discomfort, he denies any new musculoskeletal or joint aches or pains. A complete review of systems is obtained and is otherwise negative.    PHYSICAL EXAM:  Wt Readings from Last 3 Encounters:  07/08/22 265  lb 12.8 oz (120.6 kg)  02/07/22 294 lb 0.8 oz (133.4 kg)  04/08/21 (!) 304 lb (137.9 kg)   Temp Readings from Last 3 Encounters:  07/08/22 (!) 97.5 F (36.4 C) (Temporal)  02/07/22 (!) 97.4 F (36.3 C)  04/08/21 99.3 F (37.4 C) (Temporal)   BP Readings from Last 3 Encounters:  07/08/22 (!) 129/97  02/07/22 140/90  04/08/21 (!) 143/92   Pulse Readings from Last 3 Encounters:  07/08/22 86  02/07/22 85  04/08/21 89    /10  In general this is a well appearing African-American male in no acute distress.  He's alert and oriented x4 and appropriate throughout the examination. Cardiopulmonary assessment is negative for acute distress and he exhibits normal effort.   KPS = 80  100 - Normal; no complaints; no evidence of disease. 90   - Able to carry on normal activity; minor signs or symptoms of disease. 80   - Normal activity with effort; some signs or symptoms of disease. 64   - Cares for self; unable to carry on normal activity or to do active work. 60   - Requires occasional assistance, but is able to care for most of his personal needs. 50   - Requires considerable assistance and frequent medical care. 47   - Disabled; requires special care and assistance. 58   - Severely disabled; hospital admission is indicated although death not imminent. 67   - Very sick; hospital admission necessary; active supportive treatment necessary. 10   - Moribund; fatal processes progressing rapidly. 0     - Dead  Karnofsky DA, Abelmann [place], Craver LS and Burchenal [facility] [phone]) The use of the nitrogen mustards in the palliative treatment of carcinoma: with particular reference to bronchogenic carcinoma Cancer 1 634-56  LABORATORY DATA:  Lab Results  Component Value Date   WBC 4.5 06/18/2020   HGB 12.5 (L) 06/18/2020   HCT 36.7 (L) 06/18/2020   MCV 83 06/18/2020   PLT 242 06/18/2020   Lab Results  Component Value Date   NA 141 02/07/2022   K 4.4 02/07/2022   CL 106 02/07/2022   CO2 21  06/18/2020   Lab Results  Component Value Date   ALT 10 06/18/2020   AST 11 02/07/2022   ALKPHOS 339 (H) 02/07/2022   BILITOT 0.3 02/07/2022     RADIOGRAPHY: NM PET (PSMA) SKULL TO MID THIGH  Result Date: 06/11/2022 CLINICAL DATA:  Prostate cancer with nodal metastasis and skeletal metastasis. Diagnosis 2018. EXAM: NUCLEAR MEDICINE PET SKULL BASE TO THIGH TECHNIQUE: 9.0 mCi F18 Piflufolastat (Pylarify) was injected intravenously. Full-ring PET imaging was performed from the skull base to thigh after the radiotracer. CT data was obtained and used for attenuation correction and anatomic localization. COMPARISON:  Bone scan 01/17/2020, CT scan 01/17/2020 FINDINGS: NECK Bulky intensely radiotracer avid LEFT supraclavicular nodes. For example 3.7 cm node (image 67/4) with SUV max equal 60.4 Incidental CT finding: None CHEST Small but intensely radiotracer avid sub carina and peribronchial nodes with SUV max equal 20.4. radiotracer avid RIGHT lower paratracheal node additionally No suspicious pulmonary nodules. Incidental CT finding: Small LEFT effusion ABDOMEN/PELVIS Prostate: Intense radiotracer activity within the prostate gland which extends into the Seminal vesicles. Large portion the gland revolved SUV max equal 28.9 Lymph nodes: Bulky bilateral intense radiotracer avid pelvic lymph nodes. The largest RIGHT iliac node measures 3.9 cm short axis with SUV max equal 72. Similar bulky nodes at the aortic bifurcation with 3.2 cm node on the RIGHT with SUV max equal 37.1. Periaortic radiotracer avid retroperitoneal nodes extend the renal veins. Radiotracer avid retrocrural node measures 16 mm with SUV max equal 31.9 Liver: No evidence of liver metastasis Incidental CT finding: None SKELETON Multiple foci of intense radiotracer activity throughout the axillary and appendicular skeleton. Lesions involve the pelvis, spine calvarium, shoulder girdles. For example broad lesion in the LEFT sacrum with SUV max equal  37.8. There is accompanying sclerotic lesion on CT portion exam. Small lesion in the RIGHT iliac wing with SUV max equal 43. Smudgy sclerotic lesion measuring 12 mm on image 176. Radiotracer avid lesions involving the vertebral bodies and posterior elements of the upper thoracic spine. Activity extends into the ribs. Expansile lesion associated with the seventh rib on the LEFT intense radiotracer  activity (SUV max equal 65 on image 99). RIGHT calvarial lesion present on image 20. IMPRESSION: 1. Intense radiotracer activity within the prostate gland with extension into the Seminal vesicles consistent with residual prostate adenocarcinoma. 2. Bulky intensely radiotracer avid prostate cancer nodal metastasis within the pelvis, periaortic retroperitoneum and retrocrural space. 3. Metastatic adenopathy in the mediastinum and bulky metastatic nodes in the LEFT supraclavicular nodal station. 4. Extensive radiotracer avid skeletal metastasis in the axillary appendicular skeleton with associated sclerotic change on CT portion exam Electronically Signed   By: Suzy Bouchard M.D.   On: 06/11/2022 16:55      IMPRESSION/PLAN: 1. 56 y.o. male with advanced, castrate sensitive metastatic prostate cancer with painful bony disease in the left chest wall and left supraclavicular nodal region.  Today, I talked to the patient and family about the findings and work-up thus far.  We discussed the natural history of painful metastases from prostate cancer and general treatment, highlighting the role of radiotherapy in the management.  We discussed the available radiation techniques, and focused on the details of logistics and delivery.  We reviewed the anticipated acute and late sequelae associated with radiation in this setting.  The patient was encouraged to ask questions that I answered to the best of my ability.    The patient would like to proceed with radiation and will be scheduled for CT simulation tomorrow at 2 pm.  We  personally spent 60 minutes in this encounter including chart review, reviewing radiological studies, meeting face-to-face with the patient, entering orders and completing documentation.    [name], PA-C    Tyler Pita, MD  [facility] Direct Dial: [phone]  Fax: [phone] [email or web address]  Skype  [email or web address]

## 2022-07-09 NOTE — Progress Notes (Signed)
Histology and Location of Primary Cancer: Prostate Ca  Sites of Visceral and Bony Metastatic Disease: Nodal and Skeletal metastasis  Dr. Alen Blew He is a 56 year old male with diagnosed with prostate cancer in 2020 after presenting with a PSA of 42 and a biopsy at that time showed a Gleason score of 4+5 = 9.  He was found to have advanced disease with pelvic adenopathy and possibly bone disease.  He was started on androgen deprivation therapy initially under the care of Dr. Karsten Ro.  Follow-up CT scan February 2021 showed decrease in his retroperitoneal lymph node.  His PSA dropped to 2.77 in November 2021.  PSA started to rise and February 2022 up to 5.87.  In November 2022 his PSA was 99.3 and testosterone level of 19.3.  He has failed to follow-up periodically and to reestablish care with Dr. Junious Silk. PSA on May 28, 2022 was 22.1 and PSMA PET scan obtained on June 10, 2022 showed intense uptake within the prostate gland and extension into the seminal vesicle.  Bulky metastatic disease in the pelvis, periaortic and retroperitoneal lymphadenopathy.  There is extensive uptake associated with skeletal metastasis in the axillary or appendicular skeleton.  He was prescribed apalutamide in the past but was not able to get it for insurance reasons.  He is currently on Eligard which currently ongoing.  Clinically, he reported a left supraclavicular lymph node enlargement as well as left-sided chest wall and rib cage pain.  Otherwise no other complaints at this time.  Denies any nausea, vomiting or weight loss.   06/10/2022 Dr. Junious Silk NM PET (PSMA) Skull To Mid Thigh CLINICAL DATA:  Prostate cancer with nodal metastasis and skeletal metastasis. Diagnosis 2018.  FINDINGS: NECK Bulky intensely radiotracer avid LEFT supraclavicular nodes. For example 3.7 cm node (image 67/4) with SUV max equal 60.4   Incidental CT finding: None   CHEST Small but intensely radiotracer avid sub carina and peribronchial nodes  with SUV max equal 20.4. radiotracer avid RIGHT lower paratracheal node additionally.  No suspicious pulmonary nodules.   Incidental CT finding: Small LEFT effusion   ABDOMEN/PELVIS Prostate: Intense radiotracer activity within the prostate gland which extends into the Seminal vesicles. Large portion the gland revolved SUV max equal 28.9   Lymph nodes: Bulky bilateral intense radiotracer avid pelvic lymph nodes. The largest RIGHT iliac node measures 3.9 cm short axis with SUV max equal 72.   Similar bulky nodes at the aortic bifurcation with 3.2 cm node on the RIGHT with SUV max equal 37.1.   Periaortic radiotracer avid retroperitoneal nodes extend the renal veins. Radiotracer avid retrocrural node measures 16 mm with SUV max equal 31.9   Liver: No evidence of liver metastasis   Incidental CT finding: None   SKELETON Multiple foci of intense radiotracer activity throughout the axillary and appendicular skeleton. Lesions involve the pelvis, spine calvarium, shoulder girdles.   For example broad lesion in the LEFT sacrum with SUV max equal 37.8. There is accompanying sclerotic lesion on CT portion exam.   Small lesion in the RIGHT iliac wing with SUV max equal 43. Smudgy sclerotic lesion measuring 12 mm on image 176.   Radiotracer avid lesions involving the vertebral bodies and posterior elements of the upper thoracic spine. Activity extends into the ribs. Expansile lesion associated with the seventh rib on the LEFT intense radiotracer activity (SUV max equal 65 on image 99).   RIGHT calvarial lesion present on image 20.   IMPRESSION: 1. Intense radiotracer activity within the prostate gland with  extension into the Seminal vesicles consistent with residual prostate adenocarcinoma. 2. Bulky intensely radiotracer avid prostate cancer nodal metastasis within the pelvis, periaortic retroperitoneum and retrocrural space. 3. Metastatic adenopathy in the mediastinum and bulky metastatic nodes  in the LEFT supraclavicular nodal station. 4. Extensive radiotracer avid skeletal metastasis in the axillary appendicular skeleton with associated sclerotic change on CT portion exam   01/17/2020 Dr. Karsten Ro NM Bone Scan Whole Body CLINICAL DATA:  Prostate cancer, elevated PSA = 42.3.   FINDINGS: Uptake at numerous joints in the upper and lower extremities typically degenerative.   Questionable abnormal tracer uptake at the upper thoracic spine approximately T2, cannot exclude metastasis.   Subtle questionable uptake posterior LEFT sixth rib   No additional sites of abnormal osseous tracer accumulation are identified.   Expected urinary tract and soft tissue distribution of tracer.   IMPRESSION: Cannot exclude metastatic disease at T2 vertebral body, consider MR correlation.  No additional worrisome abnormalities.   07/01/2019 Dr. Karsten Ro NM Bone Scan Whole Body CLINICAL DATA:  New diagnosis prostate cancer, PSA 42.3  FINDINGS: Uptake at [facility] joints, sternoclavicular joints, wrists, knees, feet, typically degenerative.   Focal abnormal increased tracer accumulation at the posterior LEFT sixth rib.   Question increased tracer accumulation at the medial half of the posterior RIGHT fourth rib.   No additional sites of abnormal osseous tracer accumulation.   Expected urinary tract and soft tissue distribution of tracer.   IMPRESSION: Questionable metastatic lesions at the posterior LEFT sixth rib and posterior RIGHT fourth rib as above, consider radiographic correlation.   Past/Anticipated chemotherapy by medical oncology, if any: NA  Pain on a scale of 0-10 is:  6/10 neck/shoulder/upper back  Weight loss of 20 lbs over last six months.  IPSS:  11 SHIM:  10  If Spine Met(s), symptoms, if any, include: Bowel/Bladder retention or incontinence (please describe): No Numbness or weakness in extremities (please describe): No Current Decadron regimen, if applicable:  No  Ambulatory status? Walker? Wheelchair?:  Ambulates independently.  SAFETY ISSUES: Prior radiation? No Pacemaker/ICD? No Possible current pregnancy? Male Is the patient on methotrexate? No  Current Complaints / other details:

## 2022-07-10 ENCOUNTER — Inpatient Hospital Stay: Payer: Self-pay | Admitting: Licensed Clinical Social Worker

## 2022-07-10 ENCOUNTER — Other Ambulatory Visit: Payer: Self-pay

## 2022-07-10 ENCOUNTER — Ambulatory Visit
Admission: RE | Admit: 2022-07-10 | Discharge: 2022-07-10 | Disposition: A | Discharge status: Home / Self Care | Payer: Medicaid Other | Source: Ambulatory Visit | Attending: Radiation Oncology | Admitting: Radiation Oncology

## 2022-07-10 VITALS — BP 119/76 | HR 87 | Temp 98.1°F | Resp 20 | Ht 71.0 in | Wt 265.8 lb

## 2022-07-10 DIAGNOSIS — G4730 Sleep apnea, unspecified: Secondary | ICD-10-CM | POA: Insufficient documentation

## 2022-07-10 DIAGNOSIS — C61 Malignant neoplasm of prostate: Secondary | ICD-10-CM

## 2022-07-10 DIAGNOSIS — I10 Essential (primary) hypertension: Secondary | ICD-10-CM | POA: Insufficient documentation

## 2022-07-10 DIAGNOSIS — Z79899 Other long term (current) drug therapy: Secondary | ICD-10-CM | POA: Insufficient documentation

## 2022-07-10 DIAGNOSIS — C778 Secondary and unspecified malignant neoplasm of lymph nodes of multiple regions: Secondary | ICD-10-CM | POA: Diagnosis not present

## 2022-07-10 DIAGNOSIS — C7951 Secondary malignant neoplasm of bone: Secondary | ICD-10-CM | POA: Diagnosis not present

## 2022-07-10 DIAGNOSIS — Z7982 Long term (current) use of aspirin: Secondary | ICD-10-CM | POA: Insufficient documentation

## 2022-07-10 NOTE — Progress Notes (Signed)
[place] [facility]  Initial Assessment   Craig Collins is a 56 y.o. year old male contacted by phone. Clinical Social Work was referred by nurse navigator for assessment of psychosocial needs.   SDOH (Social Determinants of Health) assessments performed: Yes   SDOH Screenings   Alcohol Screen: Not on file  Depression (PHQ2-9): Low Risk  (02/07/2022)   Depression (PHQ2-9)    PHQ-2 Score: 2  Financial Resource Strain: Not on file  Food Insecurity: Not on file  Housing: Not on file  Physical Activity: Not on file  Social Connections: Not on file  Stress: Not on file  Tobacco Use: Low Risk  (02/07/2022)   Patient History    Smoking Tobacco Use: Never    Smokeless Tobacco Use: Never    Passive Exposure: Not on file  Transportation Needs: Not on file     Distress Screen completed: No    07/10/2022    9:41 AM  ONCBCN DISTRESS SCREENING  Screening Type Initial Screening  Distress experienced in past week (1-10) 7  Practical problem type Insurance  Physical Problem type Pain;Sexual problems      Family/Social Information:  Housing Arrangement: patient lives with his wife. Family members/support persons in your life? Family, Friends, and Music therapist concerns: no  Employment: Unemployed  Income source: Supported by [employer] and [employer] Financial concerns: Yes, current concerns Type of concern: Medical bills Food access concerns: no Religious or spiritual practice: Yes Services Currently in place:  None.  Coping/ Adjustment to diagnosis: Patient understands treatment plan and what happens next? yes Concerns about diagnosis and/or treatment: How I will pay for the services I need.   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  Patient has applied for disability and Medicaid.  His wife is currently on disability. Hopes and/or priorities: Priorities include getting funding for medical care. Patient enjoys time with family/ friends Current coping  skills/ strengths: Capable of independent living , Armed forces logistics/support/administrative officer , General fund of knowledge , Motivation for treatment/growth , and Supportive family/friends     SUMMARY: Current SDOH Barriers:  Lacks knowledge of community resource: Discussed [employer].  Clinical Social Work Clinical Goal(s):  Explore community resource options for unmet needs related to:  Financial Strain   Interventions: Discussed common feeling and emotions when being diagnosed with cancer, and the importance of support during treatment Informed patient of the support team roles and support services at [facility].  Discussed support group and patient declined at this time. Provided CSW contact information and encouraged patient to call with any questions or concerns Referred patient to Claire Shown to apply for the [employer].   Follow Up Plan: SW to see patient next week. Patient verbalizes understanding of plan: Yes    Craig Pickle Versie Fleener, LCSW

## 2022-07-11 ENCOUNTER — Other Ambulatory Visit: Payer: Self-pay

## 2022-07-11 ENCOUNTER — Ambulatory Visit
Admission: RE | Admit: 2022-07-11 | Discharge: 2022-07-11 | Disposition: A | Discharge status: Home / Self Care | Payer: Medicaid Other | Source: Ambulatory Visit | Attending: Radiation Oncology | Admitting: Radiation Oncology

## 2022-07-11 DIAGNOSIS — Z510 Encounter for antineoplastic radiation therapy: Secondary | ICD-10-CM | POA: Insufficient documentation

## 2022-07-11 DIAGNOSIS — C61 Malignant neoplasm of prostate: Secondary | ICD-10-CM | POA: Insufficient documentation

## 2022-07-11 DIAGNOSIS — C7951 Secondary malignant neoplasm of bone: Secondary | ICD-10-CM | POA: Insufficient documentation

## 2022-07-11 DIAGNOSIS — C778 Secondary and unspecified malignant neoplasm of lymph nodes of multiple regions: Secondary | ICD-10-CM | POA: Diagnosis not present

## 2022-07-11 NOTE — Telephone Encounter (Signed)
Oral Chemotherapy Pharmacist Encounter  I spoke with patient for overview of: Xtandi for the treatment of advanced, castration-resistant prostate cancer, planned duration until disease progression or unacceptable toxicity.   Counseled patient on administration, dosing, side effects, monitoring, drug-food interactions, safe handling, storage, and disposal.  Patient will take Xtandi '40mg'$  capsules, 4 capsules ('160mg'$ ) by mouth once daily without regard to food.  Xtandi start date: 07/15/22  Adverse effects include but are not limited to: peripheral edema, hot flashes, fatigue, falls/fractures, and arthralgias.   Patient instructed about small risk of seizures with Xtandi treatment.  Reviewed with patient importance of keeping a medication schedule and plan for any missed doses. No barriers to medication adherence identified.  Medication reconciliation performed and medication/allergy list updated.  Insurance authorization for [facility] has been obtained. Patient receives medication through patient assistance program.  Patient informed the pharmacy will reach out 5-7 days prior to needing next fill of Xtandi to coordinate continued medication acquisition to prevent break in therapy.  All questions answered.  Mr. Wyndham voiced understanding and appreciation.   Medication education handout placed in mail for patient. Patient knows to call the office with questions or concerns. Oral Chemotherapy Clinic phone number provided to patient.   Drema Halon, PharmD Hematology/Oncology Clinical Pharmacist [facility] [phone] 07/11/2022   10:24 AM

## 2022-07-11 NOTE — Progress Notes (Signed)
  Radiation Oncology         [phone] ________________________________  Name: Craig Collins MRN: [id number]  Date: 07/11/2022  DOB: 1965/12/28  SIMULATION AND TREATMENT PLANNING NOTE    ICD-10-CM   1. Malignant neoplasm of prostate metastatic to bone [facility])  C61    C79.51       DIAGNOSIS:  56 year old male with advanced, castrate sensitive metastatic prostate cancer with painful bony disease in the left chest wall and left supraclavicular nodal region.  NARRATIVE:  The patient was brought to the [place].  Identity was confirmed.  All relevant records and images related to the planned course of therapy were reviewed.  The patient freely provided informed written consent to proceed with treatment after reviewing the details related to the planned course of therapy. The consent form was witnessed and verified by the simulation staff.  Then, the patient was set-up in a stable reproducible  supine position for radiation therapy.  CT images were obtained.  Surface markings were placed.  The CT images were loaded into the planning software.  Then the target and avoidance structures were contoured.  Treatment planning then occurred.  The radiation prescription was entered and confirmed.  Then, I designed and supervised the construction of a total of multiple medically necessary complex treatment devices including body positioner to shield lungs and spinal cord.  I have requested : 3D Simulation  I have requested a DVH of the following structures: spinal cord, lungs and target.   PLAN:  The patient will receive 30 Gy in 10 fractions.  ________________________________  Sheral Apley Tammi Klippel, M.D.

## 2022-07-14 ENCOUNTER — Other Ambulatory Visit: Payer: Self-pay

## 2022-07-14 ENCOUNTER — Telehealth: Payer: Self-pay

## 2022-07-14 ENCOUNTER — Other Ambulatory Visit: Payer: Self-pay | Admitting: Radiation Oncology

## 2022-07-14 ENCOUNTER — Other Ambulatory Visit (HOSPITAL_COMMUNITY): Payer: Self-pay

## 2022-07-14 MED ORDER — LORAZEPAM 0.5 MG PO TABS
ORAL_TABLET | ORAL | 0 refills | Status: DC
  Filled 2022-07-14: qty 10, 10d supply, fill #0

## 2022-07-14 NOTE — Telephone Encounter (Signed)
Verified patient's identity. Patient states "He has Claustrophobia/ anxiety during his CT-Sim/ radiation TX's and was instructed by CT-sim to request Ativan to calm him down prior to these appointments." He is a Bone-Met patient receiving TX for Upper back, neck, oriented to LT shoulder.   Per Shona Simpson PA-C: "He should not drive within 4 hours of taking this because it can make him sleepy, and he will need someone to drive him to treatments as well for this reason.  He should also not take this with his muscle relaxer or with alcohol."   RX Ativan sent to [facility].  Patient has been notified of the message above by Cyndia Bent. LPN. Pt. has expressed verbal understanding of the information.

## 2022-07-15 ENCOUNTER — Other Ambulatory Visit (HOSPITAL_COMMUNITY): Payer: Self-pay

## 2022-07-15 ENCOUNTER — Other Ambulatory Visit: Payer: Self-pay

## 2022-07-15 DIAGNOSIS — C7951 Secondary malignant neoplasm of bone: Secondary | ICD-10-CM | POA: Diagnosis not present

## 2022-07-15 DIAGNOSIS — Z510 Encounter for antineoplastic radiation therapy: Secondary | ICD-10-CM | POA: Diagnosis not present

## 2022-07-15 DIAGNOSIS — C778 Secondary and unspecified malignant neoplasm of lymph nodes of multiple regions: Secondary | ICD-10-CM | POA: Diagnosis not present

## 2022-07-15 DIAGNOSIS — C61 Malignant neoplasm of prostate: Secondary | ICD-10-CM | POA: Diagnosis not present

## 2022-07-16 ENCOUNTER — Ambulatory Visit: Payer: Medicaid Other | Admitting: Radiation Oncology

## 2022-07-16 ENCOUNTER — Other Ambulatory Visit: Payer: Self-pay

## 2022-07-16 ENCOUNTER — Ambulatory Visit
Admission: RE | Admit: 2022-07-16 | Discharge: 2022-07-16 | Disposition: A | Discharge status: Home / Self Care | Payer: Medicaid Other | Source: Ambulatory Visit | Attending: Radiation Oncology | Admitting: Radiation Oncology

## 2022-07-16 DIAGNOSIS — C61 Malignant neoplasm of prostate: Secondary | ICD-10-CM

## 2022-07-16 DIAGNOSIS — C7951 Secondary malignant neoplasm of bone: Secondary | ICD-10-CM | POA: Diagnosis not present

## 2022-07-16 DIAGNOSIS — Z510 Encounter for antineoplastic radiation therapy: Secondary | ICD-10-CM | POA: Diagnosis not present

## 2022-07-16 DIAGNOSIS — C778 Secondary and unspecified malignant neoplasm of lymph nodes of multiple regions: Secondary | ICD-10-CM | POA: Diagnosis not present

## 2022-07-16 LAB — RAD ONC ARIA SESSION SUMMARY
Course Elapsed Days: 0
Plan Fractions Treated to Date: 1
Plan Prescribed Dose Per Fraction: 3 Gy
Plan Total Fractions Prescribed: 10
Plan Total Prescribed Dose: 30 Gy
Reference Point Dosage Given to Date: 3 Gy
Reference Point Session Dosage Given: 3 Gy
Session Number: 1

## 2022-07-17 ENCOUNTER — Ambulatory Visit: Payer: Medicaid Other | Admitting: Radiation Oncology

## 2022-07-17 ENCOUNTER — Ambulatory Visit: Payer: Medicaid Other

## 2022-07-17 ENCOUNTER — Other Ambulatory Visit: Payer: Self-pay

## 2022-07-17 ENCOUNTER — Ambulatory Visit
Admission: RE | Admit: 2022-07-17 | Discharge: 2022-07-17 | Disposition: A | Discharge status: Home / Self Care | Payer: Medicaid Other | Source: Ambulatory Visit | Attending: Radiation Oncology | Admitting: Radiation Oncology

## 2022-07-17 ENCOUNTER — Telehealth: Payer: Self-pay | Admitting: Oncology

## 2022-07-17 DIAGNOSIS — Z510 Encounter for antineoplastic radiation therapy: Secondary | ICD-10-CM | POA: Diagnosis not present

## 2022-07-17 DIAGNOSIS — C61 Malignant neoplasm of prostate: Secondary | ICD-10-CM | POA: Diagnosis not present

## 2022-07-17 DIAGNOSIS — C7951 Secondary malignant neoplasm of bone: Secondary | ICD-10-CM | POA: Diagnosis not present

## 2022-07-17 DIAGNOSIS — C778 Secondary and unspecified malignant neoplasm of lymph nodes of multiple regions: Secondary | ICD-10-CM | POA: Diagnosis not present

## 2022-07-17 LAB — RAD ONC ARIA SESSION SUMMARY
Course Elapsed Days: 1
Plan Fractions Treated to Date: 2
Plan Prescribed Dose Per Fraction: 3 Gy
Plan Total Fractions Prescribed: 10
Plan Total Prescribed Dose: 30 Gy
Reference Point Dosage Given to Date: 6 Gy
Reference Point Session Dosage Given: 3 Gy
Session Number: 2

## 2022-07-17 NOTE — Telephone Encounter (Signed)
Called a patient regarding upcoming August appointments, left a voicemail.

## 2022-07-18 ENCOUNTER — Other Ambulatory Visit: Payer: Self-pay

## 2022-07-18 ENCOUNTER — Ambulatory Visit
Admission: RE | Admit: 2022-07-18 | Discharge: 2022-07-18 | Disposition: A | Discharge status: Home / Self Care | Payer: Medicaid Other | Source: Ambulatory Visit | Attending: Radiation Oncology | Admitting: Radiation Oncology

## 2022-07-18 ENCOUNTER — Ambulatory Visit: Payer: Medicaid Other

## 2022-07-18 DIAGNOSIS — Z510 Encounter for antineoplastic radiation therapy: Secondary | ICD-10-CM | POA: Diagnosis not present

## 2022-07-18 DIAGNOSIS — C61 Malignant neoplasm of prostate: Secondary | ICD-10-CM | POA: Diagnosis not present

## 2022-07-18 DIAGNOSIS — C778 Secondary and unspecified malignant neoplasm of lymph nodes of multiple regions: Secondary | ICD-10-CM | POA: Diagnosis not present

## 2022-07-18 DIAGNOSIS — C7951 Secondary malignant neoplasm of bone: Secondary | ICD-10-CM | POA: Diagnosis not present

## 2022-07-18 LAB — RAD ONC ARIA SESSION SUMMARY
Course Elapsed Days: 2
Plan Fractions Treated to Date: 3
Plan Prescribed Dose Per Fraction: 3 Gy
Plan Total Fractions Prescribed: 10
Plan Total Prescribed Dose: 30 Gy
Reference Point Dosage Given to Date: 9 Gy
Reference Point Session Dosage Given: 3 Gy
Session Number: 3

## 2022-07-20 IMAGING — PT NM PET TUM IMG SKULL BASE T - THIGH
1 series · 10 of 10 positions shown · non-contrast
Comparison: Bone scan 01/17/2020, CT scan 01/17/2020

CLINICAL DATA: Prostate cancer with nodal metastasis and skeletal
metastasis. Diagnosis 1813.

EXAM:
NUCLEAR MEDICINE PET SKULL BASE TO THIGH
TECHNIQUE: 9.0 mCi F18 Piflufolastat (Pylarify) was injected intravenously.
Full-ring PET imaging was performed from the skull base to thigh
after the radiotracer. CT data was obtained and used for attenuation
correction and anatomic localization.

[Series 1104: results mm oncology reading · 4.0mm · 1.01mm/px · 10 of 10 slices shown]
[im 1/10]
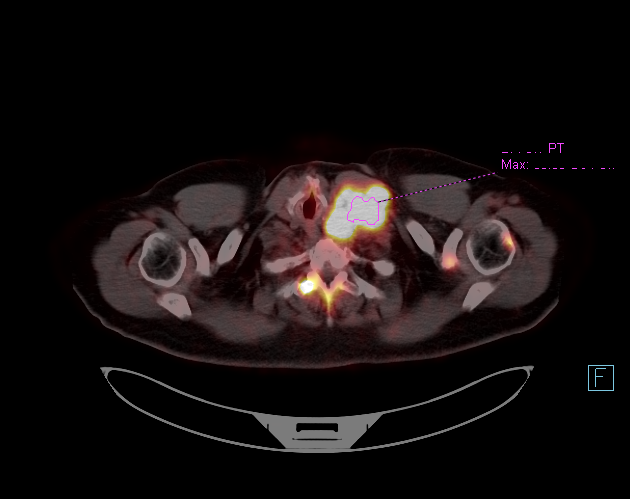
[im 2/10]
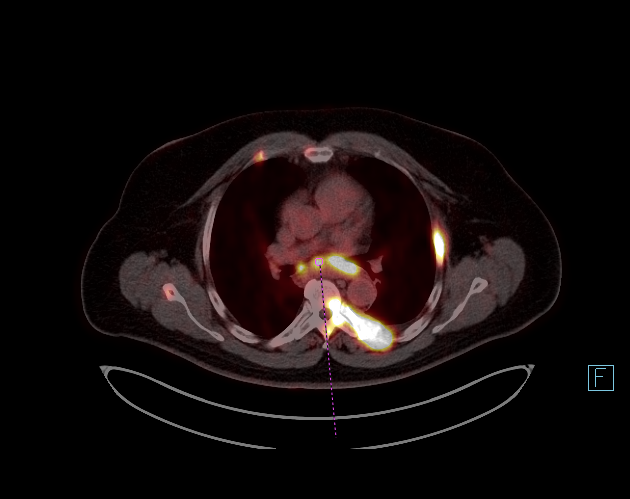
[im 3/10]
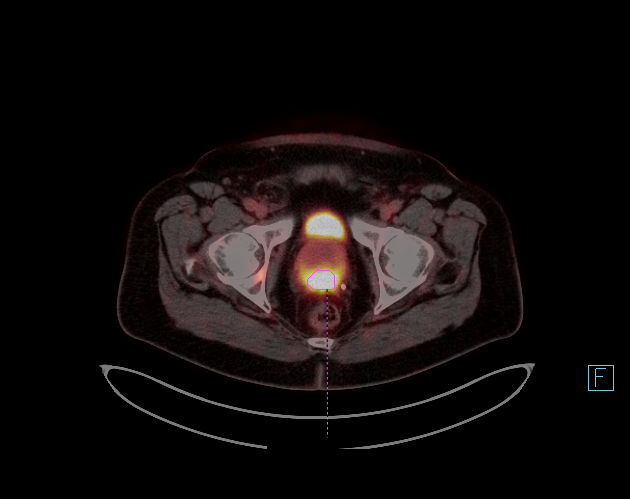
[im 4/10]
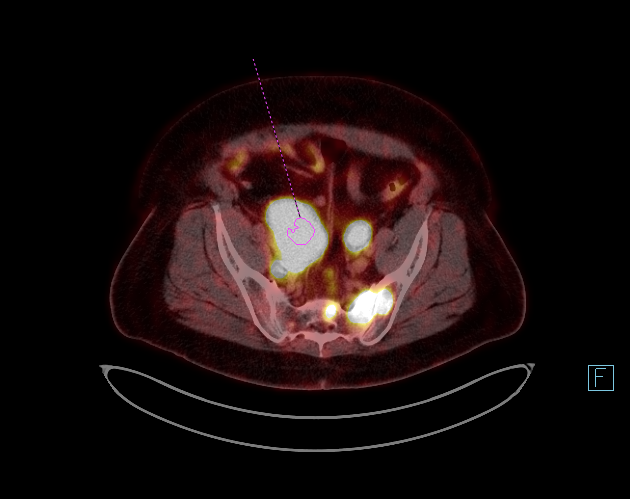
[im 5/10]
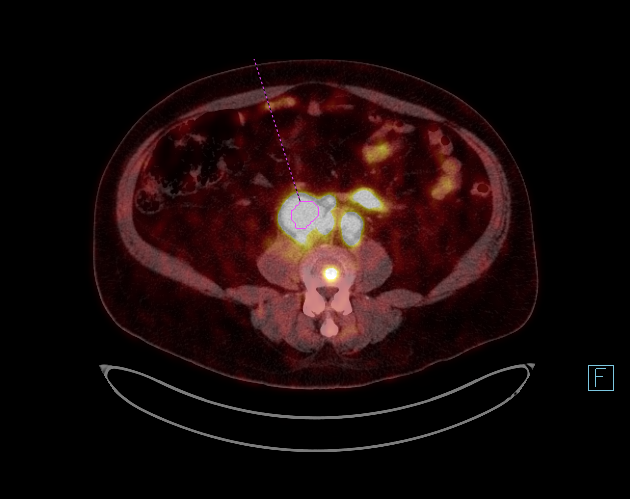
[im 6/10]
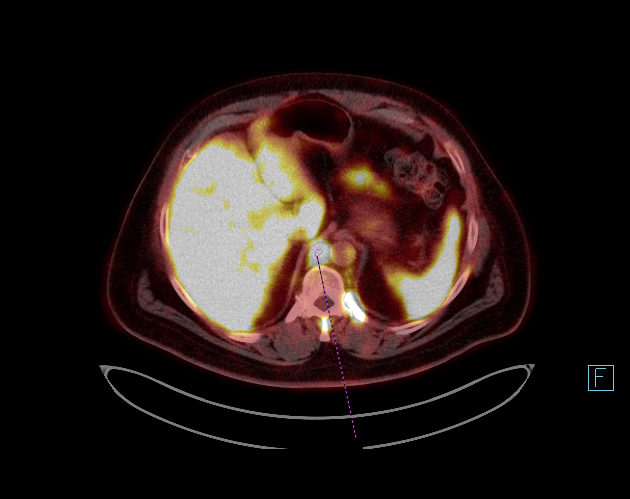
[im 7/10]
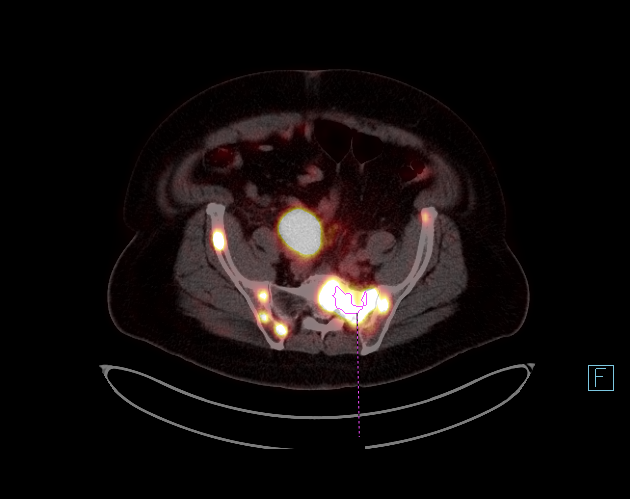
[im 8/10]
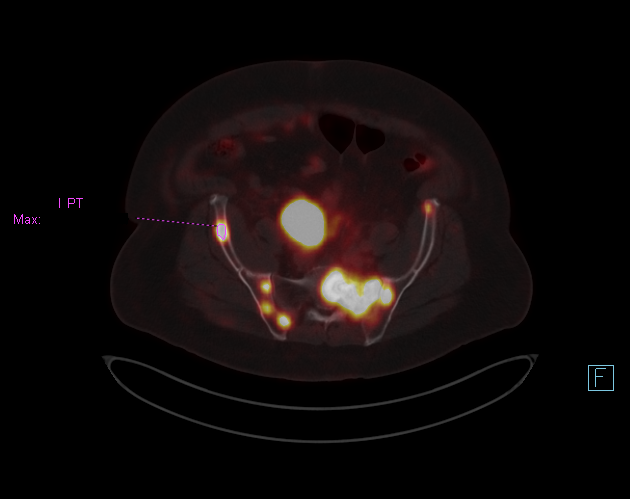
[im 9/10]
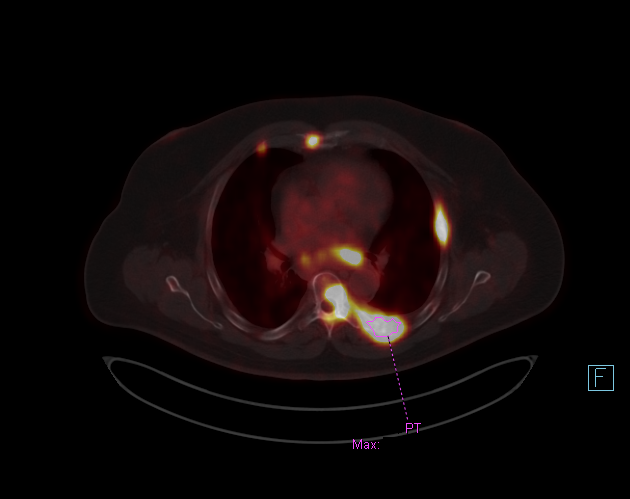
[im 10/10]
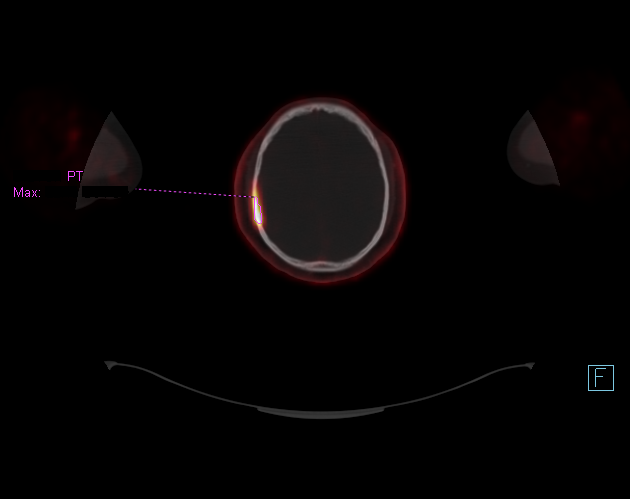

[10 of 10 positions shown; findings below may reference images not displayed]

FINDINGS: NECK

Bulky intensely radiotracer avid LEFT supraclavicular nodes. For
example 3.7 cm node (image 67/4) with SUV max equal

Incidental CT finding: None

CHEST

Small but intensely radiotracer avid sub carina and peribronchial
nodes with SUV max equal 20.4. radiotracer avid RIGHT lower
paratracheal node additionally

No suspicious pulmonary nodules.

Incidental CT finding: Small LEFT effusion

ABDOMEN/PELVIS

Prostate: Intense radiotracer activity within the prostate gland
which extends into the Seminal vesicles. Large portion the gland
revolved SUV max equal

Lymph nodes: Bulky bilateral intense radiotracer avid pelvic lymph
nodes. The largest RIGHT iliac node measures 3.9 cm short axis with
SUV max equal 72.

Similar bulky nodes at the aortic bifurcation with 3.2 cm node on
the RIGHT with SUV max equal 37.1.

Periaortic radiotracer avid retroperitoneal nodes extend the renal
veins. Radiotracer avid retrocrural node measures 16 mm with SUV max
equal

Liver: No evidence of liver metastasis

Incidental CT finding: None

SKELETON

Multiple foci of intense radiotracer activity throughout the
axillary and appendicular skeleton. Lesions involve the pelvis,
spine calvarium, shoulder girdles.

For example broad lesion in the LEFT sacrum with SUV max equal 37.8.
There is accompanying sclerotic lesion on CT portion exam.

Small lesion in the RIGHT iliac wing with SUV max equal 43. Smudgy
sclerotic lesion measuring 12 mm on image 176.

Radiotracer avid lesions involving the vertebral bodies and
posterior elements of the upper thoracic spine. Activity extends
into the ribs. Expansile lesion associated with the seventh rib on
the LEFT intense radiotracer activity (SUV max equal 65 on image
99).

RIGHT calvarial lesion present on image 20.
IMPRESSION: 1. Intense radiotracer activity within the prostate gland with
extension into the Seminal vesicles consistent with residual
prostate adenocarcinoma.
2. Bulky intensely radiotracer avid prostate cancer nodal metastasis
within the pelvis, periaortic retroperitoneum and retrocrural space.
3. Metastatic adenopathy in the mediastinum and bulky metastatic
nodes in the LEFT supraclavicular nodal station.
4. Extensive radiotracer avid skeletal metastasis in the axillary
appendicular skeleton with associated sclerotic change on CT portion
exam

## 2022-07-21 ENCOUNTER — Other Ambulatory Visit: Payer: Self-pay

## 2022-07-21 ENCOUNTER — Ambulatory Visit
Admission: RE | Admit: 2022-07-21 | Discharge: 2022-07-21 | Disposition: A | Discharge status: Home / Self Care | Payer: Medicaid Other | Source: Ambulatory Visit | Attending: Radiation Oncology | Admitting: Radiation Oncology

## 2022-07-21 DIAGNOSIS — Z510 Encounter for antineoplastic radiation therapy: Secondary | ICD-10-CM | POA: Diagnosis not present

## 2022-07-21 DIAGNOSIS — C778 Secondary and unspecified malignant neoplasm of lymph nodes of multiple regions: Secondary | ICD-10-CM | POA: Diagnosis not present

## 2022-07-21 DIAGNOSIS — C7951 Secondary malignant neoplasm of bone: Secondary | ICD-10-CM | POA: Diagnosis not present

## 2022-07-21 DIAGNOSIS — C61 Malignant neoplasm of prostate: Secondary | ICD-10-CM | POA: Diagnosis not present

## 2022-07-21 LAB — RAD ONC ARIA SESSION SUMMARY
Course Elapsed Days: 5
Plan Fractions Treated to Date: 4
Plan Prescribed Dose Per Fraction: 3 Gy
Plan Total Fractions Prescribed: 10
Plan Total Prescribed Dose: 30 Gy
Reference Point Dosage Given to Date: 12 Gy
Reference Point Session Dosage Given: 3 Gy
Session Number: 4

## 2022-07-22 ENCOUNTER — Telehealth: Payer: Self-pay | Admitting: Oncology

## 2022-07-22 ENCOUNTER — Ambulatory Visit: Payer: Medicaid Other | Admitting: Radiation Oncology

## 2022-07-22 ENCOUNTER — Other Ambulatory Visit: Payer: Self-pay

## 2022-07-22 ENCOUNTER — Ambulatory Visit
Admission: RE | Admit: 2022-07-22 | Discharge: 2022-07-22 | Disposition: A | Discharge status: Home / Self Care | Payer: Medicaid Other | Source: Ambulatory Visit | Attending: Radiation Oncology | Admitting: Radiation Oncology

## 2022-07-22 DIAGNOSIS — C778 Secondary and unspecified malignant neoplasm of lymph nodes of multiple regions: Secondary | ICD-10-CM | POA: Diagnosis not present

## 2022-07-22 DIAGNOSIS — C7951 Secondary malignant neoplasm of bone: Secondary | ICD-10-CM | POA: Diagnosis not present

## 2022-07-22 DIAGNOSIS — C61 Malignant neoplasm of prostate: Secondary | ICD-10-CM

## 2022-07-22 DIAGNOSIS — Z510 Encounter for antineoplastic radiation therapy: Secondary | ICD-10-CM | POA: Insufficient documentation

## 2022-07-22 LAB — RAD ONC ARIA SESSION SUMMARY
Course Elapsed Days: 6
Plan Fractions Treated to Date: 5
Plan Prescribed Dose Per Fraction: 3 Gy
Plan Total Fractions Prescribed: 10
Plan Total Prescribed Dose: 30 Gy
Reference Point Dosage Given to Date: 15 Gy
Reference Point Session Dosage Given: 3 Gy
Session Number: 5

## 2022-07-22 NOTE — Telephone Encounter (Signed)
Scheduled per 8/1 in basket, voicemail was full, will try to call again

## 2022-07-23 ENCOUNTER — Ambulatory Visit
Admission: RE | Admit: 2022-07-23 | Discharge: 2022-07-23 | Disposition: A | Discharge status: Home / Self Care | Payer: Medicaid Other | Source: Ambulatory Visit | Attending: Radiation Oncology | Admitting: Radiation Oncology

## 2022-07-23 ENCOUNTER — Other Ambulatory Visit: Payer: Self-pay

## 2022-07-23 ENCOUNTER — Ambulatory Visit: Payer: Medicaid Other

## 2022-07-23 DIAGNOSIS — C7951 Secondary malignant neoplasm of bone: Secondary | ICD-10-CM | POA: Diagnosis not present

## 2022-07-23 DIAGNOSIS — Z510 Encounter for antineoplastic radiation therapy: Secondary | ICD-10-CM | POA: Diagnosis not present

## 2022-07-23 DIAGNOSIS — C778 Secondary and unspecified malignant neoplasm of lymph nodes of multiple regions: Secondary | ICD-10-CM | POA: Diagnosis not present

## 2022-07-23 DIAGNOSIS — C61 Malignant neoplasm of prostate: Secondary | ICD-10-CM | POA: Diagnosis not present

## 2022-07-23 LAB — RAD ONC ARIA SESSION SUMMARY
Course Elapsed Days: 7
Plan Fractions Treated to Date: 6
Plan Prescribed Dose Per Fraction: 3 Gy
Plan Total Fractions Prescribed: 10
Plan Total Prescribed Dose: 30 Gy
Reference Point Dosage Given to Date: 18 Gy
Reference Point Session Dosage Given: 3 Gy
Session Number: 6

## 2022-07-24 ENCOUNTER — Ambulatory Visit
Admission: RE | Admit: 2022-07-24 | Discharge: 2022-07-24 | Disposition: A | Discharge status: Home / Self Care | Payer: Medicaid Other | Source: Ambulatory Visit | Attending: Radiation Oncology | Admitting: Radiation Oncology

## 2022-07-24 ENCOUNTER — Other Ambulatory Visit: Payer: Self-pay

## 2022-07-24 ENCOUNTER — Ambulatory Visit: Payer: Medicaid Other

## 2022-07-24 DIAGNOSIS — C7951 Secondary malignant neoplasm of bone: Secondary | ICD-10-CM | POA: Diagnosis not present

## 2022-07-24 DIAGNOSIS — Z510 Encounter for antineoplastic radiation therapy: Secondary | ICD-10-CM | POA: Diagnosis not present

## 2022-07-24 DIAGNOSIS — C778 Secondary and unspecified malignant neoplasm of lymph nodes of multiple regions: Secondary | ICD-10-CM | POA: Diagnosis not present

## 2022-07-24 DIAGNOSIS — C61 Malignant neoplasm of prostate: Secondary | ICD-10-CM | POA: Diagnosis not present

## 2022-07-24 LAB — RAD ONC ARIA SESSION SUMMARY
Course Elapsed Days: 8
Plan Fractions Treated to Date: 7
Plan Prescribed Dose Per Fraction: 3 Gy
Plan Total Fractions Prescribed: 10
Plan Total Prescribed Dose: 30 Gy
Reference Point Dosage Given to Date: 21 Gy
Reference Point Session Dosage Given: 3 Gy
Session Number: 7

## 2022-07-24 NOTE — Progress Notes (Signed)
Pt left voicemail regarding needing additional information regarding the [employer].    Claire Shown recently had reached out to patient and provided information needed for submission.   RN left voicemail with patient for call back.  Direct contact information left.

## 2022-07-25 ENCOUNTER — Ambulatory Visit
Admission: RE | Admit: 2022-07-25 | Discharge: 2022-07-25 | Disposition: A | Discharge status: Home / Self Care | Payer: Medicaid Other | Source: Ambulatory Visit | Attending: Radiation Oncology | Admitting: Radiation Oncology

## 2022-07-25 ENCOUNTER — Other Ambulatory Visit: Payer: Self-pay

## 2022-07-25 DIAGNOSIS — C778 Secondary and unspecified malignant neoplasm of lymph nodes of multiple regions: Secondary | ICD-10-CM | POA: Diagnosis not present

## 2022-07-25 DIAGNOSIS — Z510 Encounter for antineoplastic radiation therapy: Secondary | ICD-10-CM | POA: Diagnosis not present

## 2022-07-25 DIAGNOSIS — C7951 Secondary malignant neoplasm of bone: Secondary | ICD-10-CM | POA: Diagnosis not present

## 2022-07-25 DIAGNOSIS — C61 Malignant neoplasm of prostate: Secondary | ICD-10-CM | POA: Diagnosis not present

## 2022-07-25 LAB — RAD ONC ARIA SESSION SUMMARY
Course Elapsed Days: 9
Plan Fractions Treated to Date: 8
Plan Prescribed Dose Per Fraction: 3 Gy
Plan Total Fractions Prescribed: 10
Plan Total Prescribed Dose: 30 Gy
Reference Point Dosage Given to Date: 24 Gy
Reference Point Session Dosage Given: 3 Gy
Session Number: 8

## 2022-07-28 ENCOUNTER — Ambulatory Visit
Admission: RE | Admit: 2022-07-28 | Discharge: 2022-07-28 | Disposition: A | Discharge status: Home / Self Care | Payer: Medicaid Other | Source: Ambulatory Visit | Attending: Radiation Oncology | Admitting: Radiation Oncology

## 2022-07-28 ENCOUNTER — Ambulatory Visit: Payer: Medicaid Other

## 2022-07-28 ENCOUNTER — Other Ambulatory Visit: Payer: Self-pay

## 2022-07-28 ENCOUNTER — Ambulatory Visit: Payer: Self-pay | Admitting: Radiation Oncology

## 2022-07-28 ENCOUNTER — Ambulatory Visit: Payer: Self-pay

## 2022-07-28 DIAGNOSIS — C7951 Secondary malignant neoplasm of bone: Secondary | ICD-10-CM | POA: Diagnosis not present

## 2022-07-28 DIAGNOSIS — C61 Malignant neoplasm of prostate: Secondary | ICD-10-CM | POA: Diagnosis not present

## 2022-07-28 DIAGNOSIS — Z510 Encounter for antineoplastic radiation therapy: Secondary | ICD-10-CM | POA: Diagnosis not present

## 2022-07-28 DIAGNOSIS — C778 Secondary and unspecified malignant neoplasm of lymph nodes of multiple regions: Secondary | ICD-10-CM | POA: Diagnosis not present

## 2022-07-28 LAB — RAD ONC ARIA SESSION SUMMARY
Course Elapsed Days: 12
Plan Fractions Treated to Date: 9
Plan Prescribed Dose Per Fraction: 3 Gy
Plan Total Fractions Prescribed: 10
Plan Total Prescribed Dose: 30 Gy
Reference Point Dosage Given to Date: 27 Gy
Reference Point Session Dosage Given: 3 Gy
Session Number: 9

## 2022-07-29 ENCOUNTER — Ambulatory Visit: Payer: Medicaid Other

## 2022-07-29 ENCOUNTER — Other Ambulatory Visit: Payer: Self-pay

## 2022-07-29 ENCOUNTER — Encounter: Payer: Self-pay | Admitting: Urology

## 2022-07-29 ENCOUNTER — Ambulatory Visit
Admission: RE | Admit: 2022-07-29 | Discharge: 2022-07-29 | Disposition: A | Discharge status: Home / Self Care | Payer: Medicaid Other | Source: Ambulatory Visit | Attending: Radiation Oncology | Admitting: Radiation Oncology

## 2022-07-29 DIAGNOSIS — C778 Secondary and unspecified malignant neoplasm of lymph nodes of multiple regions: Secondary | ICD-10-CM

## 2022-07-29 DIAGNOSIS — C7951 Secondary malignant neoplasm of bone: Secondary | ICD-10-CM | POA: Diagnosis not present

## 2022-07-29 DIAGNOSIS — Z510 Encounter for antineoplastic radiation therapy: Secondary | ICD-10-CM | POA: Diagnosis not present

## 2022-07-29 DIAGNOSIS — C61 Malignant neoplasm of prostate: Secondary | ICD-10-CM

## 2022-07-29 LAB — RAD ONC ARIA SESSION SUMMARY
Course Elapsed Days: 13
Plan Fractions Treated to Date: 10
Plan Prescribed Dose Per Fraction: 3 Gy
Plan Total Fractions Prescribed: 10
Plan Total Prescribed Dose: 30 Gy
Reference Point Dosage Given to Date: 30 Gy
Reference Point Session Dosage Given: 3 Gy
Session Number: 10

## 2022-07-29 NOTE — Progress Notes (Addendum)
RN spoke with patient's wife regarding concerns for patient developing sore throat, increase in difficulty swallowing.   Denies any fever, tongue swelling, or cough.    RN educated to increase fluids, and to try OTC Biotene mouth wash for dry mouth.  RN provided education that radiation can cause decrease in salivary glands.  Explained that if this is due to radiation symptoms should expect to subside due to today being final treatment.  Pt and wife were agreeable to try above interventions and will be in contact if symptoms worsen or no improvement.

## 2022-07-30 ENCOUNTER — Ambulatory Visit: Payer: Medicaid Other

## 2022-07-31 ENCOUNTER — Ambulatory Visit: Payer: Medicaid Other

## 2022-08-01 ENCOUNTER — Other Ambulatory Visit: Payer: Self-pay | Admitting: Radiation Oncology

## 2022-08-01 ENCOUNTER — Other Ambulatory Visit (HOSPITAL_COMMUNITY): Payer: Self-pay

## 2022-08-01 ENCOUNTER — Ambulatory Visit: Payer: Medicaid Other

## 2022-08-01 NOTE — Progress Notes (Signed)
Pt's wife called with concerns for increased symptoms post radiation including cough, throat feels "backed up" after eating, trouble swallowing.   Denies any fever, or noticeable swelling.  Pt is alert and orientated, able to speak without shortness of breath.   Patient recently just completed radiation on 8/8 to the left chest wall and left supraclavicular nodal region.  Pt tried OTC liquid Gaviscon with no improvement.    RN reviewed with MD, normal expected side effects post radiation up to 14 days.  Rx sent for carafate 1 gm tab dissolved in glass of water four times daily 5 min before eating.   Pt and pt wife notified and verbalized agreement and appreciation.

## 2022-08-02 ENCOUNTER — Other Ambulatory Visit: Payer: Self-pay | Admitting: Radiation Oncology

## 2022-08-02 ENCOUNTER — Other Ambulatory Visit (HOSPITAL_COMMUNITY): Payer: Self-pay

## 2022-08-02 MED ORDER — SUCRALFATE 1 G PO TABS
1.0000 g | ORAL_TABLET | Freq: Three times a day (TID) | ORAL | 2 refills | Status: DC
  Filled 2022-08-02: qty 60, 15d supply, fill #0

## 2022-08-04 ENCOUNTER — Telehealth: Payer: Self-pay

## 2022-08-04 ENCOUNTER — Encounter (HOSPITAL_COMMUNITY): Payer: Self-pay | Admitting: *Deleted

## 2022-08-04 ENCOUNTER — Ambulatory Visit: Payer: Medicaid Other

## 2022-08-04 ENCOUNTER — Emergency Department (HOSPITAL_BASED_OUTPATIENT_CLINIC_OR_DEPARTMENT_OTHER): Payer: Medicaid Other | Admitting: Certified Registered Nurse Anesthetist

## 2022-08-04 ENCOUNTER — Encounter (HOSPITAL_COMMUNITY)
Admission: EM | Disposition: A | Discharge status: Home / Self Care | Payer: Self-pay | Source: Home / Self Care | Attending: Internal Medicine

## 2022-08-04 ENCOUNTER — Emergency Department (HOSPITAL_COMMUNITY): Payer: Medicaid Other

## 2022-08-04 ENCOUNTER — Inpatient Hospital Stay (HOSPITAL_COMMUNITY)
Admission: EM | Admit: 2022-08-04 | Discharge: 2022-08-08 | DRG: 392 | Disposition: A | Discharge status: Home / Self Care | Payer: Medicaid Other | Attending: Internal Medicine | Admitting: Internal Medicine

## 2022-08-04 ENCOUNTER — Other Ambulatory Visit: Payer: Self-pay

## 2022-08-04 ENCOUNTER — Emergency Department (HOSPITAL_COMMUNITY): Payer: Medicaid Other | Admitting: Certified Registered Nurse Anesthetist

## 2022-08-04 ENCOUNTER — Other Ambulatory Visit: Payer: Self-pay | Admitting: Radiation Oncology

## 2022-08-04 ENCOUNTER — Other Ambulatory Visit (HOSPITAL_COMMUNITY): Payer: Self-pay

## 2022-08-04 DIAGNOSIS — R1310 Dysphagia, unspecified: Secondary | ICD-10-CM

## 2022-08-04 DIAGNOSIS — C61 Malignant neoplasm of prostate: Secondary | ICD-10-CM | POA: Diagnosis present

## 2022-08-04 DIAGNOSIS — I129 Hypertensive chronic kidney disease with stage 1 through stage 4 chronic kidney disease, or unspecified chronic kidney disease: Secondary | ICD-10-CM | POA: Diagnosis present

## 2022-08-04 DIAGNOSIS — K2100 Gastro-esophageal reflux disease with esophagitis, without bleeding: Secondary | ICD-10-CM | POA: Diagnosis not present

## 2022-08-04 DIAGNOSIS — T66XXXA Radiation sickness, unspecified, initial encounter: Secondary | ICD-10-CM

## 2022-08-04 DIAGNOSIS — R450 Nervousness: Secondary | ICD-10-CM | POA: Diagnosis not present

## 2022-08-04 DIAGNOSIS — D6481 Anemia due to antineoplastic chemotherapy: Secondary | ICD-10-CM | POA: Diagnosis present

## 2022-08-04 DIAGNOSIS — K222 Esophageal obstruction: Secondary | ICD-10-CM | POA: Diagnosis not present

## 2022-08-04 DIAGNOSIS — G893 Neoplasm related pain (acute) (chronic): Secondary | ICD-10-CM | POA: Diagnosis present

## 2022-08-04 DIAGNOSIS — D631 Anemia in chronic kidney disease: Secondary | ICD-10-CM | POA: Diagnosis not present

## 2022-08-04 DIAGNOSIS — D638 Anemia in other chronic diseases classified elsewhere: Secondary | ICD-10-CM | POA: Diagnosis present

## 2022-08-04 DIAGNOSIS — N1831 Chronic kidney disease, stage 3a: Secondary | ICD-10-CM | POA: Diagnosis present

## 2022-08-04 DIAGNOSIS — T451X5A Adverse effect of antineoplastic and immunosuppressive drugs, initial encounter: Secondary | ICD-10-CM | POA: Diagnosis present

## 2022-08-04 DIAGNOSIS — K208 Other esophagitis without bleeding: Principal | ICD-10-CM | POA: Diagnosis present

## 2022-08-04 DIAGNOSIS — E876 Hypokalemia: Secondary | ICD-10-CM | POA: Diagnosis present

## 2022-08-04 DIAGNOSIS — Z192 Hormone resistant malignancy status: Secondary | ICD-10-CM | POA: Diagnosis present

## 2022-08-04 DIAGNOSIS — Z6836 Body mass index (BMI) 36.0-36.9, adult: Secondary | ICD-10-CM

## 2022-08-04 DIAGNOSIS — N189 Chronic kidney disease, unspecified: Secondary | ICD-10-CM | POA: Diagnosis not present

## 2022-08-04 DIAGNOSIS — Y842 Radiological procedure and radiotherapy as the cause of abnormal reaction of the patient, or of later complication, without mention of misadventure at the time of the procedure: Secondary | ICD-10-CM | POA: Diagnosis present

## 2022-08-04 DIAGNOSIS — K224 Dyskinesia of esophagus: Secondary | ICD-10-CM | POA: Diagnosis present

## 2022-08-04 DIAGNOSIS — C778 Secondary and unspecified malignant neoplasm of lymph nodes of multiple regions: Secondary | ICD-10-CM | POA: Diagnosis present

## 2022-08-04 DIAGNOSIS — N183 Chronic kidney disease, stage 3 unspecified: Secondary | ICD-10-CM | POA: Diagnosis present

## 2022-08-04 DIAGNOSIS — Z79899 Other long term (current) drug therapy: Secondary | ICD-10-CM

## 2022-08-04 DIAGNOSIS — R457 State of emotional shock and stress, unspecified: Secondary | ICD-10-CM | POA: Diagnosis not present

## 2022-08-04 DIAGNOSIS — C7951 Secondary malignant neoplasm of bone: Secondary | ICD-10-CM | POA: Diagnosis present

## 2022-08-04 DIAGNOSIS — E669 Obesity, unspecified: Secondary | ICD-10-CM | POA: Diagnosis present

## 2022-08-04 DIAGNOSIS — Z8249 Family history of ischemic heart disease and other diseases of the circulatory system: Secondary | ICD-10-CM

## 2022-08-04 DIAGNOSIS — I10 Essential (primary) hypertension: Secondary | ICD-10-CM | POA: Diagnosis present

## 2022-08-04 DIAGNOSIS — Z7982 Long term (current) use of aspirin: Secondary | ICD-10-CM

## 2022-08-04 HISTORY — PX: ESOPHAGOGASTRODUODENOSCOPY (EGD) WITH PROPOFOL: SHX5813

## 2022-08-04 LAB — CBC WITH DIFFERENTIAL/PLATELET
Abs Immature Granulocytes: 0.02 10*3/uL (ref 0.00–0.07)
Basophils Absolute: 0 10*3/uL (ref 0.0–0.1)
Basophils Relative: 0 %
Eosinophils Absolute: 0.1 10*3/uL (ref 0.0–0.5)
Eosinophils Relative: 1 %
HCT: 33 % — ABNORMAL LOW (ref 39.0–52.0)
Hemoglobin: 10.5 g/dL — ABNORMAL LOW (ref 13.0–17.0)
Immature Granulocytes: 0 %
Lymphocytes Relative: 5 %
Lymphs Abs: 0.3 10*3/uL — ABNORMAL LOW (ref 0.7–4.0)
MCH: 26.6 pg (ref 26.0–34.0)
MCHC: 31.8 g/dL (ref 30.0–36.0)
MCV: 83.8 fL (ref 80.0–100.0)
Monocytes Absolute: 0.6 10*3/uL (ref 0.1–1.0)
Monocytes Relative: 12 %
Neutro Abs: 4 10*3/uL (ref 1.7–7.7)
Neutrophils Relative %: 82 %
Platelets: 192 10*3/uL (ref 150–400)
RBC: 3.94 MIL/uL — ABNORMAL LOW (ref 4.22–5.81)
RDW: 15.9 % — ABNORMAL HIGH (ref 11.5–15.5)
WBC: 4.9 10*3/uL (ref 4.0–10.5)
nRBC: 0 % (ref 0.0–0.2)

## 2022-08-04 LAB — COMPREHENSIVE METABOLIC PANEL
ALT: 11 U/L (ref 0–44)
AST: 12 U/L — ABNORMAL LOW (ref 15–41)
Albumin: 3.9 g/dL (ref 3.5–5.0)
Alkaline Phosphatase: 690 U/L — ABNORMAL HIGH (ref 38–126)
Anion gap: 10 (ref 5–15)
BUN: 20 mg/dL (ref 6–20)
CO2: 22 mmol/L (ref 22–32)
Calcium: 9.5 mg/dL (ref 8.9–10.3)
Chloride: 111 mmol/L (ref 98–111)
Creatinine, Ser: 1.51 mg/dL — ABNORMAL HIGH (ref 0.61–1.24)
GFR, Estimated: 54 mL/min — ABNORMAL LOW (ref >60)
Glucose, Bld: 105 mg/dL — ABNORMAL HIGH (ref 70–99)
Potassium: 3.2 mmol/L — ABNORMAL LOW (ref 3.5–5.1)
Sodium: 143 mmol/L (ref 135–145)
Total Bilirubin: 0.4 mg/dL (ref 0.3–1.2)
Total Protein: 8.1 g/dL (ref 6.5–8.1)

## 2022-08-04 SURGERY — ESOPHAGOGASTRODUODENOSCOPY (EGD) WITH PROPOFOL
Anesthesia: General

## 2022-08-04 SURGERY — ESOPHAGOGASTRODUODENOSCOPY (EGD) WITH PROPOFOL
Anesthesia: Monitor Anesthesia Care

## 2022-08-04 MED ORDER — PANTOPRAZOLE SODIUM 40 MG IV SOLR: 40.0000 mg | Freq: Two times a day (BID) | INTRAVENOUS | Status: DC

## 2022-08-04 MED ORDER — GLUCAGON HCL RDNA (DIAGNOSTIC) 1 MG IJ SOLR
1.0000 mg | Freq: Once | INTRAMUSCULAR | Status: AC
  Administered 2022-08-04: 1 mg via INTRAVENOUS
  Filled 2022-08-04: qty 1

## 2022-08-04 MED ORDER — METOPROLOL TARTRATE 5 MG/5ML IV SOLN
2.5000 mg | Freq: Four times a day (QID) | INTRAVENOUS | Status: DC | PRN
  Administered 2022-08-07: 2.5 mg via INTRAVENOUS
  Filled 2022-08-04: qty 5

## 2022-08-04 MED ORDER — ENOXAPARIN SODIUM 40 MG/0.4ML IJ SOSY: 40.0000 mg | PREFILLED_SYRINGE | INTRAMUSCULAR | Status: DC

## 2022-08-04 MED ORDER — SUCCINYLCHOLINE CHLORIDE 200 MG/10ML IV SOSY
PREFILLED_SYRINGE | INTRAVENOUS | Status: DC | PRN
  Administered 2022-08-04: 200 mg via INTRAVENOUS

## 2022-08-04 MED ORDER — LACTATED RINGERS IV SOLN: INTRAVENOUS | Status: DC

## 2022-08-04 MED ORDER — SUCRALFATE 1 GM/10ML PO SUSP
1.0000 g | Freq: Three times a day (TID) | ORAL | Status: DC
  Administered 2022-08-05 – 2022-08-08 (×12): 1 g via ORAL
  Filled 2022-08-04 (×12): qty 10

## 2022-08-04 MED ORDER — ACETAMINOPHEN 650 MG RE SUPP: 650.0000 mg | Freq: Four times a day (QID) | RECTAL | Status: DC | PRN

## 2022-08-04 MED ORDER — DEXAMETHASONE SODIUM PHOSPHATE 10 MG/ML IJ SOLN
INTRAMUSCULAR | Status: DC | PRN
  Administered 2022-08-04: 8 mg via INTRAVENOUS

## 2022-08-04 MED ORDER — SODIUM CHLORIDE 0.9 % IV SOLN: INTRAVENOUS | Status: DC

## 2022-08-04 MED ORDER — SUCRALFATE 1 G PO TABS: 1.0000 g | ORAL_TABLET | Freq: Three times a day (TID) | ORAL | Status: DC

## 2022-08-04 MED ORDER — ENZALUTAMIDE 40 MG PO CAPS
160.0000 mg | ORAL_CAPSULE | Freq: Every day | ORAL | Status: DC
  Administered 2022-08-06 – 2022-08-08 (×3): 160 mg via ORAL

## 2022-08-04 MED ORDER — ACETAMINOPHEN 325 MG PO TABS: 650.0000 mg | ORAL_TABLET | Freq: Four times a day (QID) | ORAL | Status: DC | PRN

## 2022-08-04 MED ORDER — LIDOCAINE 2% (20 MG/ML) 5 ML SYRINGE
INTRAMUSCULAR | Status: DC | PRN
  Administered 2022-08-04: 80 mg via INTRAVENOUS

## 2022-08-04 MED ORDER — PROPOFOL 10 MG/ML IV BOLUS
INTRAVENOUS | Status: AC
  Filled 2022-08-04: qty 20

## 2022-08-04 MED ORDER — SENNOSIDES-DOCUSATE SODIUM 8.6-50 MG PO TABS: 1.0000 | ORAL_TABLET | Freq: Every evening | ORAL | Status: DC | PRN

## 2022-08-04 MED ORDER — FENTANYL CITRATE (PF) 100 MCG/2ML IJ SOLN
INTRAMUSCULAR | Status: AC
  Filled 2022-08-04: qty 2

## 2022-08-04 MED ORDER — FENTANYL CITRATE (PF) 100 MCG/2ML IJ SOLN
INTRAMUSCULAR | Status: DC | PRN
  Administered 2022-08-04: 25 ug via INTRAVENOUS

## 2022-08-04 MED ORDER — PANTOPRAZOLE SODIUM 40 MG IV SOLR
40.0000 mg | Freq: Two times a day (BID) | INTRAVENOUS | Status: DC
  Administered 2022-08-04 – 2022-08-08 (×8): 40 mg via INTRAVENOUS
  Filled 2022-08-04 (×8): qty 10

## 2022-08-04 MED ORDER — ONDANSETRON HCL 4 MG/2ML IJ SOLN
INTRAMUSCULAR | Status: DC | PRN
  Administered 2022-08-04: 4 mg via INTRAVENOUS

## 2022-08-04 MED ORDER — ONDANSETRON HCL 4 MG/2ML IJ SOLN: 4.0000 mg | Freq: Four times a day (QID) | INTRAMUSCULAR | Status: DC | PRN

## 2022-08-04 MED ORDER — OXYCODONE HCL 5 MG PO TABS
5.0000 mg | ORAL_TABLET | ORAL | Status: DC | PRN
  Filled 2022-08-04: qty 1

## 2022-08-04 MED ORDER — LISINOPRIL 10 MG PO TABS
20.0000 mg | ORAL_TABLET | Freq: Every day | ORAL | Status: DC
  Administered 2022-08-06 – 2022-08-08 (×3): 20 mg via ORAL
  Filled 2022-08-04 (×4): qty 2

## 2022-08-04 MED ORDER — HYDROCHLOROTHIAZIDE 12.5 MG PO TABS
12.5000 mg | ORAL_TABLET | Freq: Every day | ORAL | Status: DC
  Administered 2022-08-06 – 2022-08-08 (×3): 12.5 mg via ORAL
  Filled 2022-08-04 (×4): qty 1

## 2022-08-04 MED ORDER — PROPOFOL 10 MG/ML IV BOLUS
INTRAVENOUS | Status: DC | PRN
  Administered 2022-08-04: 160 mg via INTRAVENOUS

## 2022-08-04 MED ORDER — ONDANSETRON HCL 4 MG PO TABS: 4.0000 mg | ORAL_TABLET | Freq: Four times a day (QID) | ORAL | Status: DC | PRN

## 2022-08-04 SURGICAL SUPPLY — 15 items

## 2022-08-04 NOTE — ED Triage Notes (Signed)
Pt arrived via ems a&ox4 c/o difficulty swallowing and forcing self to cough. Hx of acid reflux and current cancer patient, recent radiation completed. 0/10  pain and anxious.

## 2022-08-04 NOTE — Anesthesia Postprocedure Evaluation (Signed)
Anesthesia Post Note  Patient: Craig Collins  Procedure(s) Performed: ESOPHAGOGASTRODUODENOSCOPY (EGD) WITH PROPOFOL     Patient location during evaluation: Endoscopy Anesthesia Type: General Level of consciousness: awake and patient cooperative Pain management: pain level controlled Vital Signs Assessment: post-procedure vital signs reviewed and stable Respiratory status: spontaneous breathing, nonlabored ventilation, respiratory function stable and patient connected to nasal cannula oxygen Cardiovascular status: blood pressure returned to baseline and stable Postop Assessment: no apparent nausea or vomiting Anesthetic complications: no   No notable events documented.  Last Vitals:  Vitals:   08/04/22 1749 08/04/22 1818  BP:  (!) 140/92  Pulse:  96  Resp:  17  Temp: 37.6 C 37.5 C  SpO2:  100%    Last Pain:  Vitals:   08/04/22 1818  TempSrc: Oral  PainSc: 0-No pain                 Roshawna Colclasure

## 2022-08-04 NOTE — H&P (Signed)
History and Physical    Craig Collins:[id number] DOB: [birth year] DOA: 08/04/2022  PCP: Vevelyn Francois, NP   Patient coming from: Home  I have personally briefly reviewed patient's old medical records in [place]  Chief Complaint: Inability to swallow  HPI: Craig Collins is a 56 y.o. male with medical history significant of advanced castrate sensitive metastatic prostate cancer with painful bony mets in the chest wall and left supraclavicular lymph node who recently finished 10 radiation treatments, hypertension presented with difficulty swallowing that started today.  Patient apparently ate some chicken creamer: With mac & cheese last night and tolerated his meals but this morning, he woke up with severe acid reflux with active acid regurgitation and was not able to swallow his secretions.  Patient denies any epigastric pain, nausea, vomiting, fever, shortness of breath, diarrhea, dysuria, chest pain or palpitations.  ED Course: Chest x-ray was negative for any acute infiltrates.  There was a concern for food impaction.  GI was consulted.  He underwent EGD which showed radiation esophagitis.  GI recommended that patient be admitted to the hospital, start clear liquid diet and use IV Protonix along with oral sucralfate. Hospitalist service was called to evaluate the patient.  Review of Systems: As per HPI otherwise all other systems were reviewed and are negative.   Past Medical History:  Diagnosis Date   Benign prostate hyperplasia    Elevated PSA    Erectile dysfunction    Hypertension    Obesity (BMI 35.0-39.9 without comorbidity)    Prostate cancer ([place]) 05/2019   Sleep apnea    Urinary retention     Past Surgical History:  Procedure Laterality Date   COLONOSCOPY N/A 10/14/2017   Procedure: COLONOSCOPY;  Surgeon: Gatha Mayer, MD;  Location: [place];  Service: Endoscopy;  Laterality: N/A;     reports that he has never smoked. He has never used  smokeless tobacco. He reports that he does not currently use alcohol. He reports that he does not use drugs.  No Known Allergies  Family History  Problem Relation Age of Onset   Hypertension Other    Hypertension Brother    Colon cancer Neg Hx     Prior to Admission medications   Medication Sig Start Date End Date Taking? Authorizing Provider  aspirin EC 81 MG tablet Take 81 mg by mouth daily.   Yes [provider]  Calcium Carbonate-Vitamin D (CALCIUM 500/D) 500-125 MG-UNIT TABS Take by mouth.   Yes [provider]  cyclobenzaprine (FLEXERIL) 10 MG tablet Take 1 tablet (10 mg total) by mouth 3 (three) times daily as needed for muscle spasms. 02/07/22  Yes Vevelyn Francois, NP  ibuprofen (ADVIL) 800 MG tablet TAKE 1 TABLET (800 MG TOTAL) BY MOUTH EVERY 8 (EIGHT) HOURS AS NEEDED. 02/07/22 02/07/23 Yes King, Diona Foley, NP  lisinopril (ZESTRIL) 20 MG tablet TAKE 1 TABLET (20 MG TOTAL) BY MOUTH DAILY. 08/21/21 08/21/22 Yes King, Diona Foley, NP  tamsulosin (FLOMAX) 0.4 MG CAPS capsule TAKE 1 CAPSULE (0.4 MG TOTAL) BY MOUTH DAILY. 09/20/21 09/20/22 Yes Vevelyn Francois, NP  vitamin B-12 1000 MCG tablet Take 1 tablet (1,000 mcg total) by mouth daily. 10/15/17  Yes Lavina Hamman, MD  enzalutamide Gillermina Phy) 40 MG capsule Take 4 capsules (160 mg total) by mouth daily. 07/08/22   [name], MD  hydrochlorothiazide (HYDRODIURIL) 25 MG tablet TAKE 1/2 TABLET (12.5 MG TOTAL) BY MOUTH DAILY. 02/07/22   Vevelyn Francois,  NP  LORazepam (ATIVAN) 0.5 MG tablet Take 1 tablet by mouth 30 minutes prior to radiation for claustrophobia. 07/14/22   Hayden Pedro, PA-C  sucralfate (CARAFATE) 1 g tablet Take 1 tablet (1 g total) by mouth 4 (four) times daily -  with meals and at bedtime. 5 min before meals for radiation induced esophagitis 08/02/22   Tyler Pita, MD    Physical Exam: Vitals:   08/04/22 1532 08/04/22 1608 08/04/22 1618 08/04/22 1628  BP: (!) 148/109 (!) 144/82 133/85 134/83   Pulse: (!) 107 (!) 108 (!) 109 (!) 106  Resp: _0 Temp: 98.8 F (37.1 C)     TempSrc: Temporal     SpO2: 98% 92% 98% 99%  Weight: 117.9 kg     Height: _1  (1.803 m)       Constitutional: NAD, calm, comfortable Vitals:   08/04/22 1532 08/04/22 1608 08/04/22 1618 08/04/22 1628  BP: (!) 148/109 (!) 144/82 133/85 134/83  Pulse: (!) 107 (!) 108 (!) 109 (!) 106  Resp: _2 Temp: 98.8 F (37.1 C)     TempSrc: Temporal     SpO2: 98% 92% 98% 99%  Weight: 117.9 kg     Height: _3  (1.803 m)      Eyes: PERRL, lids and conjunctivae normal ENMT: Mucous membranes are dry.  Posterior pharynx clear of any exudate or lesions. Neck: normal, supple, no masses, no thyromegaly Respiratory: bilateral decreased breath sounds at bases, no wheezing, no crackles. Normal respiratory effort. No accessory muscle use.  Cardiovascular: S1 S2 positive, tachycardic.  No extremity edema. 2+ pedal pulses.  Abdomen: no tenderness, no masses palpated. No hepatosplenomegaly. Bowel sounds positive.  Musculoskeletal: no clubbing / cyanosis. No joint deformity upper and lower extremities.  Skin: no rashes, lesions, ulcers. No induration Neurologic: CN 2-12 grossly intact. Moving extremities. No focal neurologic deficits.  Psychiatric: Normal judgment and insight. Alert and oriented x 3. Normal mood.    Labs on Admission: I have personally reviewed following labs and imaging studies  CBC: Recent Labs  Lab 08/04/22 1122  WBC 4.9  NEUTROABS 4.0  HGB 10.5*  HCT 33.0*  MCV 83.8  PLT 950   Basic Metabolic Panel: Recent Labs  Lab 08/04/22 1122  NA 143  K 3.2*  CL 111  CO2 22  GLUCOSE 105*  BUN 20  CREATININE 1.51*  CALCIUM 9.5   GFR: Estimated Creatinine Clearance: 71.3 mL/min (A) (by C-G formula based on SCr of 1.51 mg/dL (H)). Liver Function Tests: Recent Labs  Lab 08/04/22 1122  AST 12*  ALT 11  ALKPHOS 690*  BILITOT 0.4  PROT 8.1  ALBUMIN 3.9   No results for  input(s): "LIPASE", "AMYLASE" in the last 168 hours. No results for input(s): "AMMONIA" in the last 168 hours. Coagulation Profile: No results for input(s): "INR", "PROTIME" in the last 168 hours. Cardiac Enzymes: No results for input(s): "CKTOTAL", "CKMB", "CKMBINDEX", "TROPONINI" in the last 168 hours. BNP (last 3 results) No results for input(s): "PROBNP" in the last 8760 hours. HbA1C: No results for input(s): "HGBA1C" in the last 72 hours. CBG: No results for input(s): "GLUCAP" in the last 168 hours. Lipid Profile: No results for input(s): "CHOL", "HDL", "LDLCALC", "TRIG", "CHOLHDL", "LDLDIRECT" in the last 72 hours. Thyroid Function Tests: No results for input(s): "TSH", "T4TOTAL", "FREET4", "T3FREE", "THYROIDAB" in the last 72 hours. Anemia Panel: No results for input(s): "VITAMINB12", "FOLATE", "FERRITIN", "TIBC", "IRON", "RETICCTPCT" in the last 72 hours.  Urine analysis:    Component Value Date/Time   BILIRUBINUR neg 04/08/2021 1204   KETONESUR negative 07/15/2019 1032   PROTEINUR Negative 04/08/2021 1204   UROBILINOGEN 0.2 04/08/2021 1204   NITRITE neg 04/08/2021 1204   LEUKOCYTESUR Negative 04/08/2021 1204    Radiological Exams on Admission: DG Chest Port 1 View  Result Date: 08/04/2022 CLINICAL DATA:  Difficulty swallowing, cough EXAM: PORTABLE CHEST 1 VIEW COMPARISON:  Chest radiograph done on 10/01/2006, PET-CT done on 06/10/2022 FINDINGS: Cardiac size is within normal limits. There are no signs of pulmonary edema or focal pulmonary consolidation. There is no pleural effusion or pneumothorax. There is possible expansile lesion in the posterior left seventh rib. No gross abnormal tracer uptake in the left seventh rib in the previous PET-CT. IMPRESSION: No focal pulmonary infiltrates are seen. There is no pleural effusion or pneumothorax. There is expansile lesion in the posterior left seventh rib is consistent with skeletal metastatic disease. Electronically Signed   By:  Elmer Picker M.D.   On: 08/04/2022 12:14     Assessment/Plan  Dysphagia/radiation esophagitis -Patient presented with difficulty swallowing from today and EGD showed findings suggestive of radiation esophagitis.  Patient still unable to swallow.  GI recommending to start clear liquid diet today; avoid NSAIDs.  Use Protonix 40 mg IV twice daily and oral sucralfate 1 g p.o. 4 times daily. -Follow further GI recommendations. -Continue IV fluids. Iv antiemetics as needed  Hypokalemia -Repeat labs in a.m.  Hypertension Monitor blood pressure.  Resume home regimen in a.m.  Anemia chronic disease -Hemoglobin stable.  Metastatic prostate cancer with mets to bone and lymph node -Recently finished 10 radiation treatments.  Outpatient follow-up with Dr. Manny/urology   DVT prophylaxis: Lovenox Code Status: Full Family Communication: None at bedside Disposition Plan: Observation/MedSurg Consults called: ED provider called GI Admission status: Observation/MedSurg  Severity of Illness: The appropriate patient status for this patient is OBSERVATION. Observation status is judged to be reasonable and necessary in order to provide the required intensity of service to ensure the patient's safety. The patient's presenting symptoms, physical exam findings, and initial radiographic and laboratory data in the context of their medical condition is felt to place them at decreased risk for further clinical deterioration. Furthermore, it is anticipated that the patient will be medically stable for discharge from the hospital within 2 midnights of admission.     Aline August MD [facility]  08/04/2022, 5:32 PM

## 2022-08-04 NOTE — Op Note (Signed)
[facility] Patient Name: Craig Collins Procedure Date: 08/04/2022 MRN: [id number] Attending MD: Juanita Craver , MD Date of Birth: 24-Nov-1966 CSN: [id number] Age: 56 Admit Type: Inpatient Procedure:                Diagnostic EGD. Indications:              Acute dysphagia, Gastro-esophageal reflux disease,                            Metastatic prostate cancer. Providers:                Juanita Craver, MD, Doristine Johns, RN, Gloris Ham, Technician, Herbie Drape, CRNA, Janeece Riggers, MD Referring MD:             Braxton Feathers. Eulis Foster, MD Medicines:                Monitored Anesthesia Care Complications:            No immediate complications. Estimated Blood Loss:     Estimated blood loss: none. Procedure:                Pre-Anesthesia Assessment: - Prior to the                            procedure, a history and physical was performed,                            and patient medications and allergies were                            reviewed. The patient's tolerance of previous                            anesthesia was also reviewed. The risks and                            benefits of the procedure and the sedation options                            and risks were discussed with the patient. All                            questions were answered, and informed consent was                            obtained. Prior Anticoagulants: The patient has                            taken no previous anticoagulant or antiplatelet                            agents except  for aspirin. ASA Grade Assessment:                            III - A patient with severe systemic disease. After                            reviewing the risks and benefits, the patient was                            deemed in satisfactory condition to undergo the                            procedure. After obtaining informed consent, the                             endoscope was passed under direct vision.                            Throughout the procedure, the patient's blood                            pressure, pulse, and oxygen saturations were                            monitored continuously. The [id number]-[id number] ([id number])                            Olympus endoscope was introduced through the mouth,                            and advanced to the second part of duodenum. The                            EGD was accomplished without difficulty. The                            patient tolerated the procedure well. Scope In: Scope Out: Findings:      LA Grade D (one or more mucosal breaks involving at least 75% of       esophageal circumference) esophagitis with no bleeding was found in the       proxinal 1/3 of the esophagus; the distal esophgaus appeared normal..      The entire examined stomach was normal.      The cardia and gastric fundus were normal on retroflexion.      The examined duodenum was normal. Impression:               - LA Grade D radiation esophagitis with no bleeding                            in proximal 1/3 of the esophagus.                           - Normal appearing stomach.                           -  Normal examined duodenum.                           - No specimens collected. Moderate Sedation:      MAC used.. Recommendation:           - Admit to the hospital.                           - Clear liquid diet today.                           - Continue present medications except for NSAIDS.                           - Use Protonix (pantoprazole) 40 mg IV BID daily.                           - Use Sucralfate suspension 1 gram PO QID daily. Procedure Code(s):        --- Professional ---                           [phone], Esophagogastroduodenoscopy, flexible,                            transoral; diagnostic, including collection of                            specimen(s) by brushing or washing, when performed                             (separate procedure) Diagnosis Code(s):        --- Professional ---                           R13.10, Dysphagia, unspecified                           K21.9, Gastro-esophageal reflux disease without                            esophagitis                           K20.80, Other esophagitis without bleeding CPT copyright 2019 [employer]. All rights reserved. The codes documented in this report are preliminary and upon coder review may  be revised to meet current compliance requirements. Juanita Craver, MD Juanita Craver, MD 08/04/2022 4:18:33 PM This report has been signed electronically. Number of Addenda: 0

## 2022-08-04 NOTE — ED Provider Notes (Signed)
[facility] DEPT Provider Note   CSN: [id number] Arrival date & time: 08/04/22  1036     History  No chief complaint on file.   Craig Collins is a 56 y.o. male.  HPI Patient states he has been spitting up saliva for 2 days, because he feels like he cannot swallow.  He was able to eat yesterday, and did not vomit.  His symptoms worsened during the night so he decided come here for evaluation.  He recently completed a treatment course of radiation therapy to the chest for cancer.  He is taking not taking PPI or Carafate.  No previous esophageal obstruction.    Home Medications Prior to Admission medications   Medication Sig Start Date End Date Taking? Authorizing Provider  aspirin EC 81 MG tablet Take 81 mg by mouth daily.    [provider]  Calcium Carbonate-Vitamin D (CALCIUM 500/D) 500-125 MG-UNIT TABS Take by mouth.    [provider]  cyclobenzaprine (FLEXERIL) 10 MG tablet Take 1 tablet (10 mg total) by mouth 3 (three) times daily as needed for muscle spasms. 02/07/22   Vevelyn Francois, NP  enzalutamide Gillermina Phy) 40 MG capsule Take 4 capsules (160 mg total) by mouth daily. 07/08/22   [name], MD  hydrochlorothiazide (HYDRODIURIL) 25 MG tablet TAKE 1/2 TABLET (12.5 MG TOTAL) BY MOUTH DAILY. 02/07/22   Vevelyn Francois, NP  ibuprofen (ADVIL) 800 MG tablet TAKE 1 TABLET (800 MG TOTAL) BY MOUTH EVERY 8 (EIGHT) HOURS AS NEEDED. 02/07/22 02/07/23  Vevelyn Francois, NP  lisinopril (ZESTRIL) 20 MG tablet TAKE 1 TABLET (20 MG TOTAL) BY MOUTH DAILY. 08/21/21 08/21/22  Vevelyn Francois, NP  LORazepam (ATIVAN) 0.5 MG tablet Take 1 tablet by mouth 30 minutes prior to radiation for claustrophobia. 07/14/22   Hayden Pedro, PA-C  sucralfate (CARAFATE) 1 g tablet Take 1 tablet (1 g total) by mouth 4 (four) times daily -  with meals and at bedtime. 5 min before meals for radiation induced esophagitis 08/02/22   Tyler Pita, MD  tamsulosin  (FLOMAX) 0.4 MG CAPS capsule TAKE 1 CAPSULE (0.4 MG TOTAL) BY MOUTH DAILY. 09/20/21 09/20/22  Vevelyn Francois, NP  vitamin B-12 1000 MCG tablet Take 1 tablet (1,000 mcg total) by mouth daily. 10/15/17   Lavina Hamman, MD      Allergies    Patient has no known allergies.    Review of Systems   Review of Systems  Physical Exam Updated Vital Signs BP (!) 147/94   Pulse 90   Temp 97.9 F (36.6 C) (Oral) Comment: Simultaneous filing. User may not have seen previous data.  Resp 16   Ht '5\' 11"'$  (1.803 m)   Wt 117.9 kg   SpO2 97%   BMI 36.26 kg/m  Physical Exam Vitals and nursing note reviewed.  Constitutional:      General: He is in acute distress (Spitting clear saliva.).     Appearance: He is well-developed. He is obese. He is not ill-appearing or diaphoretic.  HENT:     Head: Normocephalic and atraumatic.     Right Ear: External ear normal.     Left Ear: External ear normal.  Eyes:     Conjunctiva/sclera: Conjunctivae normal.     Pupils: Pupils are equal, round, and reactive to light.  Neck:     Trachea: Phonation normal.  Cardiovascular:     Rate and Rhythm: Normal rate.  Pulmonary:     Effort:  Pulmonary effort is normal.  Abdominal:     General: There is no distension.     Tenderness: There is no abdominal tenderness.  Musculoskeletal:        General: Normal range of motion.     Cervical back: Normal range of motion and neck supple.  Skin:    General: Skin is warm and dry.  Neurological:     Mental Status: He is alert and oriented to person, place, and time.     Cranial Nerves: No cranial nerve deficit.     Sensory: No sensory deficit.     Motor: No abnormal muscle tone.     Coordination: Coordination normal.  Psychiatric:        Mood and Affect: Mood normal.        Behavior: Behavior normal.        Thought Content: Thought content normal.        Judgment: Judgment normal.     ED Results / Procedures / Treatments   Labs (all labs ordered are listed, but  only abnormal results are displayed) Labs Reviewed  COMPREHENSIVE METABOLIC PANEL - Abnormal; Notable for the following components:      Result Value   Potassium 3.2 (*)    Glucose, Bld 105 (*)    Creatinine, Ser 1.51 (*)    AST 12 (*)    Alkaline Phosphatase 690 (*)    GFR, Estimated 54 (*)    All other components within normal limits  CBC WITH DIFFERENTIAL/PLATELET - Abnormal; Notable for the following components:   RBC 3.94 (*)    Hemoglobin 10.5 (*)    HCT 33.0 (*)    RDW 15.9 (*)    Lymphs Abs 0.3 (*)    All other components within normal limits    EKG None  Radiology DG Chest Port 1 View  Result Date: 08/04/2022 CLINICAL DATA:  Difficulty swallowing, cough EXAM: PORTABLE CHEST 1 VIEW COMPARISON:  Chest radiograph done on 10/01/2006, PET-CT done on 06/10/2022 FINDINGS: Cardiac size is within normal limits. There are no signs of pulmonary edema or focal pulmonary consolidation. There is no pleural effusion or pneumothorax. There is possible expansile lesion in the posterior left seventh rib. No gross abnormal tracer uptake in the left seventh rib in the previous PET-CT. IMPRESSION: No focal pulmonary infiltrates are seen. There is no pleural effusion or pneumothorax. There is expansile lesion in the posterior left seventh rib is consistent with skeletal metastatic disease. Electronically Signed   By: Elmer Picker M.D.   On: 08/04/2022 12:14    Procedures Procedures    Medications Ordered in ED Medications  0.9 %  sodium chloride infusion ( Intravenous New Bag/Given 08/04/22 1205)  glucagon (human recombinant) (GLUCAGEN) injection 1 mg (has no administration in time range)  glucagon (human recombinant) (GLUCAGEN) injection 1 mg (1 mg Intravenous Given 08/04/22 1202)    ED Course/ Medical Decision Making/ A&P                           Medical Decision Making Patient presenting with signs of esophageal obstruction, following recent radiation therapy.  Amount and/or  Complexity of Data Reviewed Independent Historian:     Details: He is cogent historian.  Wife at bedside gives additional history. Labs: ordered. Radiology: ordered and independent interpretation performed.    Details: Chest x-ray-no infiltrate or edema, no pneumothorax Discussion of management or test interpretation with external provider(s): Case discussed with gastroenterologist, Dr. Collene Mares She  will evaluate the patient in the ED and likely perform endoscopy for evaluation of esophageal obstruction.  Risk Decision regarding hospitalization. Risk Details: Patient presenting with signs and symptoms of esophageal she will obstruction, likely complicated/caused by recent radiation treatment for lung cancer.  Screening labs and chest x-ray are reassuring.  No evidence for pneumonia, heart failure, pneumothorax, or external compression of the esophagus.  Incidental note made of metastatic disease on chest x-ray.  Patient will require endoscopy prior to decision for management including hospitalization or discharge home.  Critical Care Total time providing critical care: 40 minutes           Final Clinical Impression(s) / ED Diagnoses Final diagnoses:  Esophageal obstruction    Rx / DC Orders ED Discharge Orders     None         Daleen Bo, MD 08/04/22 1347

## 2022-08-04 NOTE — Telephone Encounter (Signed)
RN called patient left voice message where to pick up prescription that was sent out 08/02/2022 per Dr. Tammi Klippel.

## 2022-08-04 NOTE — Anesthesia Preprocedure Evaluation (Addendum)
Anesthesia Evaluation  Patient identified by MRN, date of birth, ID band Patient awake    Reviewed: Allergy & Precautions, H&P , NPO status , Patient's Chart, lab work & pertinent test results, reviewed documented beta blocker date and time   Airway Mallampati: I  TM Distance: >3 FB Neck ROM: full    Dental no notable dental hx. (+) Teeth Intact, Dental Advisory Given   Pulmonary sleep apnea ,    Pulmonary exam normal breath sounds clear to auscultation       Cardiovascular Exercise Tolerance: Good hypertension, Pt. on medications  Rhythm:regular Rate:Normal     Neuro/Psych  Neuromuscular disease negative psych ROS   GI/Hepatic negative GI ROS, Neg liver ROS,   Endo/Other  negative endocrine ROS  Renal/GU CRFRenal disease  negative genitourinary   Musculoskeletal   Abdominal   Peds  Hematology  (+) Blood dyscrasia, anemia ,   Anesthesia Other Findings   Reproductive/Obstetrics negative OB ROS                            Anesthesia Physical Anesthesia Plan  ASA: 3 and emergent  Anesthesia Plan: General   Post-op Pain Management: Minimal or no pain anticipated   Induction: Intravenous, Cricoid pressure planned and Rapid sequence  PONV Risk Score and Plan: Ondansetron and Treatment may vary due to age or medical condition  Airway Management Planned: Oral ETT  Additional Equipment: None  Intra-op Plan:   Post-operative Plan: Extubation in OR  Informed Consent: I have reviewed the patients History and Physical, chart, labs and discussed the procedure including the risks, benefits and alternatives for the proposed anesthesia with the patient or authorized representative who has indicated his/her understanding and acceptance.     Dental Advisory Given and Dental advisory given  Plan Discussed with: CRNA and Anesthesiologist  Anesthesia Plan Comments:         Anesthesia  Quick Evaluation

## 2022-08-04 NOTE — Transfer of Care (Signed)
Immediate Anesthesia Transfer of Care Note  Patient: ERNESTINE LANGWORTHY  Procedure(s) Performed: ESOPHAGOGASTRODUODENOSCOPY (EGD) WITH PROPOFOL  Patient Location: Endoscopy Unit  Anesthesia Type:General  Level of Consciousness: awake, alert , oriented and patient cooperative  Airway & Oxygen Therapy: Patient Spontanous Breathing and Patient connected to nasal cannula oxygen  Post-op Assessment: Report given to RN and Post -op Vital signs reviewed and stable  Post vital signs: Reviewed and stable  Last Vitals:  Vitals Value Taken Time  BP 133/85 1612  Temp    Pulse 106 1612  Resp 18 1612  SpO2 96% 1612    Last Pain:  Vitals:   08/04/22 1608  TempSrc:   PainSc: 0-No pain         Complications: No notable events documented.

## 2022-08-04 NOTE — Anesthesia Procedure Notes (Signed)
Procedure Name: Intubation Date/Time: 08/04/2022 3:50 PM  Performed by: Raenette Rover, CRNAPre-anesthesia Checklist: Patient identified, Emergency Drugs available, Suction available and Patient being monitored Patient Re-evaluated:Patient Re-evaluated prior to induction Oxygen Delivery Method: Circle system utilized Preoxygenation: Pre-oxygenation with 100% oxygen Induction Type: IV induction, Rapid sequence and Cricoid Pressure applied Laryngoscope Size: Miller and 3 Grade View: Grade I Tube type: Oral Tube size: 7.5 mm Number of attempts: 1 Airway Equipment and Method: Stylet Placement Confirmation: ETT inserted through vocal cords under direct vision, positive ETCO2 and breath sounds checked- equal and bilateral Secured at: 22 cm Tube secured with: Tape Dental Injury: Teeth and Oropharynx as per pre-operative assessment

## 2022-08-04 NOTE — Consult Note (Signed)
UNASSIGNED PATIENT Reason for Consult:Difficulty swallowing. Referring Physician: ER MD  Craig Collins is an 56 y.o. male.  HPI: Craig Collins is a 56 year old black male with multiple medical problems listed below who has recently finished 10 radiation treatments of radiation for advanced castrate sensitive metastatic prostate cancer with painful bony mets in the chest wall and left supraclavicular lymph node.  He claims he had some chicken creamer corn and macaroni and cheese last night and tolerated his meals well but this morning when he woke up he had severe acid reflux with active acid regurgitation since then he has not been able to swallow his secretions.  According to the notes he was prescribed Carafate suspension which he has not been able to procure from his pharmacy for reasons not clear to me.  He is not on a PPI either.  He has been taking Ibuprofen 2-3 times a day as lysed by his PCP for bone pain. He denies having any epigastric pain,  nausea or vomiting. He has never had problems with dysphagia before.  He had a colonoscopy done by Dr. Agapito Games on 10/14/2017 that revealed sigmoid diverticulosis and a rectal polyp was removed.  Past Medical History:  Diagnosis Date   Benign prostate hyperplasia    Elevated PSA    Erectile dysfunction    Hypertension    Obesity (BMI 35.0-39.9 without comorbidity)    Prostate cancer ([place]) 05/2019   Sleep apnea    Urinary retention    Past Surgical History:  Procedure Laterality Date   COLONOSCOPY N/A 10/14/2017   Procedure: COLONOSCOPY;  Surgeon: Gatha Mayer, MD;  Location: [facility];  Service: Endoscopy;  Laterality: N/A;   Family History  Problem Relation Age of Onset   Hypertension Other    Hypertension Brother    Colon cancer Neg Hx    Social History:  reports that he has never smoked. He has never used smokeless tobacco. He reports that he does not currently use alcohol. He reports that he does not use  drugs.  Allergies: No Known Allergies  Medications: I have reviewed the patient's current medications. Prior to Admission:  Medications Prior to Admission  Medication Sig Dispense Refill Last Dose   aspirin EC 81 MG tablet Take 81 mg by mouth daily.   08/03/2022   Calcium Carbonate-Vitamin D (CALCIUM 500/D) 500-125 MG-UNIT TABS Take by mouth.   08/03/2022   cyclobenzaprine (FLEXERIL) 10 MG tablet Take 1 tablet (10 mg total) by mouth 3 (three) times daily as needed for muscle spasms. 60 tablet 3 Past Month   ibuprofen (ADVIL) 800 MG tablet TAKE 1 TABLET (800 MG TOTAL) BY MOUTH EVERY 8 (EIGHT) HOURS AS NEEDED. 60 tablet 3 Past Week   lisinopril (ZESTRIL) 20 MG tablet TAKE 1 TABLET (20 MG TOTAL) BY MOUTH DAILY. 90 tablet 3 08/03/2022   tamsulosin (FLOMAX) 0.4 MG CAPS capsule TAKE 1 CAPSULE (0.4 MG TOTAL) BY MOUTH DAILY. 30 capsule 11 08/03/2022   vitamin B-12 1000 MCG tablet Take 1 tablet (1,000 mcg total) by mouth daily. 30 tablet 0 08/03/2022   enzalutamide (XTANDI) 40 MG capsule Take 4 capsules (160 mg total) by mouth daily. 120 capsule 1    hydrochlorothiazide (HYDRODIURIL) 25 MG tablet TAKE 1/2 TABLET (12.5 MG TOTAL) BY MOUTH DAILY. 45 tablet 3 08/02/2022   LORazepam (ATIVAN) 0.5 MG tablet Take 1 tablet by mouth 30 minutes prior to radiation for claustrophobia. 10 tablet 0 07/29/2022   sucralfate (CARAFATE) 1 g tablet Take 1 tablet (  1 g total) by mouth 4 (four) times daily -  with meals and at bedtime. 5 min before meals for radiation induced esophagitis 60 tablet 2    Scheduled: Continuous:  sodium chloride 125 mL/hr at 08/04/22 1546   sodium chloride     PRN:  Results for orders placed or performed during the hospital encounter of 08/04/22 (from the past 48 hour(s))  Comprehensive metabolic panel     Status: Abnormal   Collection Time: 08/04/22 11:22 AM  Result Value Ref Range   Sodium 143 135 - 145 mmol/L   Potassium 3.2 (L) 3.5 - 5.1 mmol/L   Chloride 111 98 - 111 mmol/L   CO2 22 22 -  32 mmol/L   Glucose, Bld 105 (H) 70 - 99 mg/dL    Comment: Glucose reference range applies only to samples taken after fasting for at least 8 hours.   BUN 20 6 - 20 mg/dL   Creatinine, Ser 1.51 (H) 0.61 - 1.24 mg/dL   Calcium 9.5 8.9 - 10.3 mg/dL   Total Protein 8.1 6.5 - 8.1 g/dL   Albumin 3.9 3.5 - 5.0 g/dL   AST 12 (L) 15 - 41 U/L   ALT 11 0 - 44 U/L   Alkaline Phosphatase 690 (H) 38 - 126 U/L   Total Bilirubin 0.4 0.3 - 1.2 mg/dL   GFR, Estimated 54 (L) >60 mL/min    Comment: (NOTE) Calculated using the CKD-EPI Creatinine Equation (2021)    Anion gap 10 5 - 15    Comment: Performed at [facility], [place] [address] [place], [place] [address]  CBC with Differential     Status: Abnormal   Collection Time: 08/04/22 11:22 AM  Result Value Ref Range   WBC 4.9 4.0 - 10.5 K/uL   RBC 3.94 (L) 4.22 - 5.81 MIL/uL   Hemoglobin 10.5 (L) 13.0 - 17.0 g/dL   HCT 33.0 (L) 39.0 - 52.0 %   MCV 83.8 80.0 - 100.0 fL   MCH 26.6 26.0 - 34.0 pg   MCHC 31.8 30.0 - 36.0 g/dL   RDW 15.9 (H) 11.5 - 15.5 %   Platelets 192 150 - 400 K/uL   nRBC 0.0 0.0 - 0.2 %   Neutrophils Relative % 82 %   Neutro Abs 4.0 1.7 - 7.7 K/uL   Lymphocytes Relative 5 %   Lymphs Abs 0.3 (L) 0.7 - 4.0 K/uL   Monocytes Relative 12 %   Monocytes Absolute 0.6 0.1 - 1.0 K/uL   Eosinophils Relative 1 %   Eosinophils Absolute 0.1 0.0 - 0.5 K/uL   Basophils Relative 0 %   Basophils Absolute 0.0 0.0 - 0.1 K/uL   Immature Granulocytes 0 %   Abs Immature Granulocytes 0.02 0.00 - 0.07 K/uL    Comment: Performed at [facility], [place] [address] [place], [place] [address]    DG Chest Port 1 View  Result Date: 08/04/2022 CLINICAL DATA:  Difficulty swallowing, cough EXAM: PORTABLE CHEST 1 VIEW COMPARISON:  Chest radiograph done on 10/01/2006, PET-CT done on 06/10/2022 FINDINGS: Cardiac size is within normal limits. There are no signs of pulmonary edema or focal pulmonary consolidation. There is  no pleural effusion or pneumothorax. There is possible expansile lesion in the posterior left seventh rib. No gross abnormal tracer uptake in the left seventh rib in the previous PET-CT. IMPRESSION: No focal pulmonary infiltrates are seen. There is no pleural effusion or pneumothorax. There is expansile lesion in the posterior left  seventh rib is consistent with skeletal metastatic disease. Electronically Signed   By: Elmer Picker M.D.   On: 08/04/2022 12:14    Review of Systems  Constitutional: Negative.   HENT: Negative.    Eyes: Negative.   Respiratory: Negative.    Cardiovascular: Negative.   Gastrointestinal: Negative.   Endocrine: Negative.   Allergic/Immunologic: Negative.   Neurological: Negative.   Hematological:  Positive for adenopathy.  Psychiatric/Behavioral: Negative.     Blood pressure (!) 162/101, pulse 95, temperature 99.5 F (37.5 C), temperature source Oral, resp. rate 16, height '5\' 11"'$  (1.803 m), weight 117.9 kg, SpO2 91 %. Physical Exam Constitutional:      General: He is in acute distress.     Appearance: He is obese. He is ill-appearing.  HENT:     Head: Normocephalic and atraumatic.     Mouth/Throat:     Mouth: Mucous membranes are dry.  Eyes:     Extraocular Movements: Extraocular movements intact.     Pupils: Pupils are equal, round, and reactive to light.  Cardiovascular:     Rate and Rhythm: Normal rate and regular rhythm.  Pulmonary:     Effort: Pulmonary effort is normal.     Breath sounds: Normal breath sounds.  Abdominal:     General: There is no distension.     Palpations: Abdomen is soft.     Tenderness: There is no abdominal tenderness. There is no guarding.  Musculoskeletal:     Cervical back: Normal range of motion and neck supple.  Skin:    General: Skin is warm and dry.  Neurological:     General: No focal deficit present.     Mental Status: He is alert and oriented to person, place, and time.  Psychiatric:        Mood and  Affect: Mood normal.        Behavior: Behavior normal.        Thought Content: Thought content normal.        Judgment: Judgment normal.   Assessment/Plan: 1) Acute dysphagia with active acid reflux and recheck regurgitation/inability to swallow secretions-status post 10th radiation treatment for metastatic prostate cancer, a week ago-proceed with a EGD at this time. 2) Sigmoid diverticulosis with a rectal polyp noted on a colonoscopy done in 2018. 3) Metastatic prostate cancer. 4) Stage IIIa chronic kidney disease. 5) HTN. 6) Morbid obesity. Juanita Craver 08/04/2022, 3:24 PM

## 2022-08-05 ENCOUNTER — Other Ambulatory Visit (HOSPITAL_COMMUNITY): Payer: Self-pay

## 2022-08-05 ENCOUNTER — Encounter (HOSPITAL_COMMUNITY): Payer: Self-pay | Admitting: Gastroenterology

## 2022-08-05 DIAGNOSIS — D638 Anemia in other chronic diseases classified elsewhere: Secondary | ICD-10-CM | POA: Diagnosis present

## 2022-08-05 DIAGNOSIS — Z8249 Family history of ischemic heart disease and other diseases of the circulatory system: Secondary | ICD-10-CM | POA: Diagnosis not present

## 2022-08-05 DIAGNOSIS — T451X5A Adverse effect of antineoplastic and immunosuppressive drugs, initial encounter: Secondary | ICD-10-CM | POA: Diagnosis present

## 2022-08-05 DIAGNOSIS — Z6836 Body mass index (BMI) 36.0-36.9, adult: Secondary | ICD-10-CM | POA: Diagnosis not present

## 2022-08-05 DIAGNOSIS — E876 Hypokalemia: Secondary | ICD-10-CM | POA: Diagnosis present

## 2022-08-05 DIAGNOSIS — K224 Dyskinesia of esophagus: Secondary | ICD-10-CM | POA: Diagnosis present

## 2022-08-05 DIAGNOSIS — K208 Other esophagitis without bleeding: Secondary | ICD-10-CM | POA: Diagnosis present

## 2022-08-05 DIAGNOSIS — D6481 Anemia due to antineoplastic chemotherapy: Secondary | ICD-10-CM | POA: Diagnosis present

## 2022-08-05 DIAGNOSIS — Z79899 Other long term (current) drug therapy: Secondary | ICD-10-CM | POA: Diagnosis not present

## 2022-08-05 DIAGNOSIS — C778 Secondary and unspecified malignant neoplasm of lymph nodes of multiple regions: Secondary | ICD-10-CM | POA: Diagnosis present

## 2022-08-05 DIAGNOSIS — G893 Neoplasm related pain (acute) (chronic): Secondary | ICD-10-CM | POA: Diagnosis present

## 2022-08-05 DIAGNOSIS — C61 Malignant neoplasm of prostate: Secondary | ICD-10-CM | POA: Diagnosis present

## 2022-08-05 DIAGNOSIS — E669 Obesity, unspecified: Secondary | ICD-10-CM | POA: Diagnosis present

## 2022-08-05 DIAGNOSIS — I129 Hypertensive chronic kidney disease with stage 1 through stage 4 chronic kidney disease, or unspecified chronic kidney disease: Secondary | ICD-10-CM | POA: Diagnosis present

## 2022-08-05 DIAGNOSIS — Y842 Radiological procedure and radiotherapy as the cause of abnormal reaction of the patient, or of later complication, without mention of misadventure at the time of the procedure: Secondary | ICD-10-CM | POA: Diagnosis present

## 2022-08-05 DIAGNOSIS — Z7982 Long term (current) use of aspirin: Secondary | ICD-10-CM | POA: Diagnosis not present

## 2022-08-05 DIAGNOSIS — N1831 Chronic kidney disease, stage 3a: Secondary | ICD-10-CM | POA: Diagnosis present

## 2022-08-05 DIAGNOSIS — K222 Esophageal obstruction: Secondary | ICD-10-CM | POA: Diagnosis not present

## 2022-08-05 DIAGNOSIS — T66XXXA Radiation sickness, unspecified, initial encounter: Secondary | ICD-10-CM

## 2022-08-05 DIAGNOSIS — C7951 Secondary malignant neoplasm of bone: Secondary | ICD-10-CM | POA: Diagnosis present

## 2022-08-05 LAB — CBC
HCT: 29.6 % — ABNORMAL LOW (ref 39.0–52.0)
Hemoglobin: 9.3 g/dL — ABNORMAL LOW (ref 13.0–17.0)
MCH: 26.6 pg (ref 26.0–34.0)
MCHC: 31.4 g/dL (ref 30.0–36.0)
MCV: 84.8 fL (ref 80.0–100.0)
Platelets: 172 10*3/uL (ref 150–400)
RBC: 3.49 MIL/uL — ABNORMAL LOW (ref 4.22–5.81)
RDW: 16 % — ABNORMAL HIGH (ref 11.5–15.5)
WBC: 4.4 10*3/uL (ref 4.0–10.5)
nRBC: 0 % (ref 0.0–0.2)

## 2022-08-05 LAB — COMPREHENSIVE METABOLIC PANEL
ALT: 12 U/L (ref 0–44)
AST: 11 U/L — ABNORMAL LOW (ref 15–41)
Albumin: 3.4 g/dL — ABNORMAL LOW (ref 3.5–5.0)
Alkaline Phosphatase: 608 U/L — ABNORMAL HIGH (ref 38–126)
Anion gap: 7 (ref 5–15)
BUN: 21 mg/dL — ABNORMAL HIGH (ref 6–20)
CO2: 22 mmol/L (ref 22–32)
Calcium: 8.9 mg/dL (ref 8.9–10.3)
Chloride: 117 mmol/L — ABNORMAL HIGH (ref 98–111)
Creatinine, Ser: 1.28 mg/dL — ABNORMAL HIGH (ref 0.61–1.24)
GFR, Estimated: >60 mL/min (ref >60)
Glucose, Bld: 99 mg/dL (ref 70–99)
Potassium: 3.9 mmol/L (ref 3.5–5.1)
Sodium: 146 mmol/L — ABNORMAL HIGH (ref 135–145)
Total Bilirubin: 0.6 mg/dL (ref 0.3–1.2)
Total Protein: 7.2 g/dL (ref 6.5–8.1)

## 2022-08-05 LAB — MAGNESIUM: Magnesium: 2.3 mg/dL (ref 1.7–2.4)

## 2022-08-05 MED ORDER — NITROGLYCERIN 0.4 MG SL SUBL
0.4000 mg | SUBLINGUAL_TABLET | SUBLINGUAL | Status: DC | PRN
  Administered 2022-08-05 (×2): 0.4 mg via SUBLINGUAL
  Filled 2022-08-05 (×2): qty 1

## 2022-08-05 MED ORDER — LIDOCAINE VISCOUS HCL 2 % MT SOLN
15.0000 mL | OROMUCOSAL | Status: DC | PRN
  Administered 2022-08-05 – 2022-08-06 (×4): 15 mL via OROMUCOSAL
  Filled 2022-08-05 (×6): qty 15

## 2022-08-05 MED ORDER — HYDROCODONE-ACETAMINOPHEN 7.5-325 MG/15ML PO SOLN: 10.0000 mL | ORAL | Status: DC | PRN

## 2022-08-05 MED ORDER — BOOST / RESOURCE BREEZE PO LIQD CUSTOM
1.0000 | Freq: Three times a day (TID) | ORAL | Status: DC
  Administered 2022-08-05 – 2022-08-08 (×7): 1 via ORAL

## 2022-08-05 MED ORDER — PHENOL 1.4 % MT LIQD
1.0000 | OROMUCOSAL | Status: DC | PRN
  Administered 2022-08-05: 1 via OROMUCOSAL
  Filled 2022-08-05: qty 177

## 2022-08-05 NOTE — Progress Notes (Signed)
Progress Note   Patient: Craig Collins IOM:[id number] DOB: August 07, 1966 DOA: 08/04/2022     0 DOS: the patient was seen and examined on 08/05/2022 at 10:25 AM      Brief hospital course: Craig Collins is a 56 y.o. M with advanced castrate sensitive metastatic prostate cancer with bony mets to chest wall and left supraclavicular lymph node recent radiation treatments, and HTN who presented with acute difficulty swallowing    8/14: Admitted, Dr. Collene Mares consulted, went for EGD     Assessment and Plan: * Radiation esophagitis Just finished 10 days radiation, and now with new onset dysphagia, choking, and globus sensation.    GI consulted, did EGD yesterday that showed LA Grade D radiation esophagitis with no bleeding in proximal 1/3 of the esophagus, consistent with radiation esophagitis.  Discussed with Dr. Tammi Klippel, this is an expected complication, first line treatment would be sucralfate and viscous lidocaine.  In most cases it resolves within 14 days.  Fluid support likely necessary for next few days, feeding tube would be unusual.  - Continue sucralfate as able - Add viscous lidocaine - Continue IV PPI  - SLP consult to advance diet, other than choking, no medical barrier to advancing to soft non-acidic diet      Morbid obesity (HCC) BMI 36 in setting of hypertension  Anemia associated with chemotherapy Hgb low but stable, no bleeding  Hypokalemia - Supplement potassium  Malignant neoplasm of prostate metastatic to lymph nodes of multiple sites ([place]) - Continue Xtandi as able to take p.o.  Stage 3a chronic kidney disease (CKD) (HCC) Creatinine stable relative to baseline  Hypertension, essential Blood pressure slightly elevated - Continue HCTZ, lisinopril, metoprolol when able to take p.o.          Subjective: Patient unable to swallow saliva, he has globus sensation, feeling of something "stuck in his throat", has heartburn.  No significant chest pain or neck  pain, no vomiting.  No fever.     Physical Exam: Vitals:   08/05/22 0124 08/05/22 0617 08/05/22 0850 08/05/22 1159  BP: (!) 150/96 (!) 153/100 (!) 166/99 (!) 167/102  Pulse: 90 83 91 (!) 109  Resp: '20 20 19 18  '$ Temp: 98.7 F (37.1 C) 97.7 F (36.5 C) 98.1 F (36.7 C) 98.2 F (36.8 C)  TempSrc: Oral Oral Oral Oral  SpO2: 98% 100% 100% 94%  Weight:      Height:       Obese adult male, sitting up in recliner, using anchor to suction out all of his saliva, appears uncomfortable, agitated/restless Tachycardic, regular, no murmurs, no peripheral edema Respiratory rate normal, lungs clear without rales or wheezes Abdomen soft without tenderness palpation or guarding, no ascites or distention Anxious, but attention normal, judgment insight appear normal, face symmetric, speech fluent, moves all extremities with normal coordination   Data Reviewed: Discussed with radiation oncology EGD report reviewed, shows esophagitis in the proximal esophagus CBC shows mild anemia, stable Basic metabolic panel shows mild hypokalemia, resolved, magnesium normal, creatinine stable at baseline Chest x-ray shows no metastasis     Family Communication: Wife at the bedside    Disposition: Status is: Inpatient The patient was admitted with severe radiation esophagitis  For the time being he is unable to swallow anything will need IV fluids  We will consult speech therapy, and give supportive care with IV fluids as we use the tincture of time to wait for his esophagitis to improve to the point that he can take in sufficient  calories and fluids to go home, hopefully this will happen in the next few days with the support of his speech, sucralfate, and viscous lidocaine          Author: Edwin Dada, MD 08/05/2022 4:49 PM  For on call review [email or web address].

## 2022-08-05 NOTE — Progress Notes (Signed)
Subjective: Unable to swallow.  Objective: Vital signs in last 24 hours: Temp:  [97.7 F (36.5 C)-99.6 F (37.6 C)] 98.2 F (36.8 C) (08/15 1159) Pulse Rate:  [83-109] 109 (08/15 1159) Resp:  [15-20] 18 (08/15 1159) BP: (135-167)/(83-102) 167/102 (08/15 1159) SpO2:  [93 %-100 %] 94 % (08/15 1159) Last BM Date : 08/03/22  Intake/Output from previous day: 08/14 0701 - 08/15 0700 In: 2466.9 [P.O.:240; I.V.:2226.9] Out: -  Intake/Output this shift: Total I/O In: 996.1 [I.V.:996.1] Out: 0   General appearance: alert and no distress GI: soft, non-tender; bowel sounds normal; no masses,  no organomegaly  Lab Results: Recent Labs    08/04/22 1122 08/05/22 0535  WBC 4.9 4.4  HGB 10.5* 9.3*  HCT 33.0* 29.6*  PLT 192 172   BMET Recent Labs    08/04/22 1122 08/05/22 0535  NA 143 146*  K 3.2* 3.9  CL 111 117*  CO2 22 22  GLUCOSE 105* 99  BUN 20 21*  CREATININE 1.51* 1.28*  CALCIUM 9.5 8.9   LFT Recent Labs    08/05/22 0535  PROT 7.2  ALBUMIN 3.4*  AST 11*  ALT 12  ALKPHOS 608*  BILITOT 0.6   PT/INR No results for input(s): "LABPROT", "INR" in the last 72 hours. Hepatitis Panel No results for input(s): "HEPBSAG", "HCVAB", "HEPAIGM", "HEPBIGM" in the last 72 hours. C-Diff No results for input(s): "CDIFFTOX" in the last 72 hours. Fecal Lactopherrin No results for input(s): "FECLLACTOFRN" in the last 72 hours.  Studies/Results: DG Chest Port 1 View  Result Date: 08/04/2022 CLINICAL DATA:  Difficulty swallowing, cough EXAM: PORTABLE CHEST 1 VIEW COMPARISON:  Chest radiograph done on 10/01/2006, PET-CT done on 06/10/2022 FINDINGS: Cardiac size is within normal limits. There are no signs of pulmonary edema or focal pulmonary consolidation. There is no pleural effusion or pneumothorax. There is possible expansile lesion in the posterior left seventh rib. No gross abnormal tracer uptake in the left seventh rib in the previous PET-CT. IMPRESSION: No focal pulmonary  infiltrates are seen. There is no pleural effusion or pneumothorax. There is expansile lesion in the posterior left seventh rib is consistent with skeletal metastatic disease. Electronically Signed   By: Elmer Picker M.D.   On: 08/04/2022 12:14    Medications: Scheduled:  enzalutamide  160 mg Oral Daily   feeding supplement  1 Container Oral TID BM   hydrochlorothiazide  12.5 mg Oral Daily   lisinopril  20 mg Oral Daily   pantoprazole (PROTONIX) IV  40 mg Intravenous Q12H   sucralfate  1 g Oral TID WC & HS   Continuous:  lactated ringers 125 mL/hr at 08/04/22 2243    Assessment/Plan: 1) Esophageal spasm. 2) Radiation stricture.   His EGD was positive for a radiation stricture, but it was patent.  Clinically his report is that the act of swallowing will induce a dysphagia sensation.  He will be tried on SL NTG to see if this can help to alleviate the esophageal spasm.  If this is beneficial the sucralfate can be applied. He can also benefit with using viscous lidocaine.  If he fails this intervention he may need to have a PEG tube placed in the near future.  Plan: 1) SL NTG now. 2)   LOS: 0 days   Jezlyn Westerfield D 08/05/2022, 4:55 PM

## 2022-08-05 NOTE — Assessment & Plan Note (Signed)
-   Continue Xtandi as able to take p.o.

## 2022-08-05 NOTE — Assessment & Plan Note (Addendum)
Just finished 10 days radiation, and now with new onset dysphagia, choking, and globus sensation.    GI consulted, did EGD yesterday that showed LA Grade D radiation esophagitis with no bleeding in proximal 1/3 of the esophagus, consistent with radiation esophagitis.  Discussed with Dr. Tammi Klippel, this is an expected complication, first line treatment would be sucralfate and viscous lidocaine.  In most cases it resolves within 14 days.  Fluid support likely necessary for next few days, feeding tube would be unusual.  - Continue sucralfate as able - Add viscous lidocaine - Continue IV PPI  - SLP consult to advance diet, other than choking, no medical barrier to advancing to soft non-acidic diet

## 2022-08-05 NOTE — Assessment & Plan Note (Signed)
Creatinine stable relative to baseline 

## 2022-08-05 NOTE — ED Provider Notes (Signed)
  Physical Exam  BP (!) 150/96 (BP Location: Left Arm)   Pulse 90   Temp 98.7 F (37.1 C) (Oral)   Resp 20   Ht '5\' 11"'$  (1.803 m)   Wt 117.9 kg   SpO2 98%   BMI 36.25 kg/m   Physical Exam Constitutional:      General: He is not in acute distress.    Appearance: Normal appearance.  HENT:     Head: Normocephalic and atraumatic.     Nose: No congestion or rhinorrhea.  Eyes:     General:        Right eye: No discharge.        Left eye: No discharge.     Extraocular Movements: Extraocular movements intact.     Pupils: Pupils are equal, round, and reactive to light.  Cardiovascular:     Rate and Rhythm: Normal rate and regular rhythm.     Heart sounds: No murmur heard. Pulmonary:     Effort: No respiratory distress.     Breath sounds: No wheezing or rales.  Abdominal:     General: There is no distension.     Tenderness: There is no abdominal tenderness.  Musculoskeletal:        General: Normal range of motion.     Cervical back: Normal range of motion.  Skin:    General: Skin is warm and dry.  Neurological:     General: No focal deficit present.     Mental Status: He is alert.     Procedures  Procedures  ED Course / MDM    Medical Decision Making Amount and/or Complexity of Data Reviewed Labs: ordered. Radiology: ordered.  Risk Prescription drug management. Decision regarding hospitalization.   Patient received in handoff.  Initial concern for food bolus.  Went to endoscopy and was found to have radiation esophagitis.  Patient with persistent difficulty in tolerating secretions.  Patient then admitted to medicine.       Teressa Lower, MD 08/05/22 [phone]

## 2022-08-05 NOTE — Assessment & Plan Note (Signed)
Treated and resolved 

## 2022-08-05 NOTE — Assessment & Plan Note (Signed)
Blood pressure slightly elevated - Continue HCTZ, lisinopril, metoprolol when able to take p.o.

## 2022-08-05 NOTE — Evaluation (Addendum)
SLP Cancellation Note  Patient Details Name: Craig Collins MRN: [id number] DOB: 1966-09-26   Cancelled treatment:       Reason Eval/Treat Not Completed: Other (comment) (Order for swallow evaluation received, Spoke to Dr Loleta Books and saw that lidocaine is ordered *pharmacy has not sent yet.  Also reviewed GI note and SQ Nitro ordered and administered with pt having swallow improvement per RN report.    RN is deferring Carafate administration until lidocaine received *and after more SQ Nitro given) thus will follow up with pt next date.  RN in agreement to plan.    Note per GI if pt's esophagitis/radiation induced dysphagia does not resolve, he may require PEG.  Kathleen Lime, MS [facility] SLP Acute Rehab Services Office [phone] Pager [phone]    Macario Golds 08/05/2022, 5:51 PM

## 2022-08-05 NOTE — Progress Notes (Addendum)
Initial Nutrition Assessment  DOCUMENTATION CODES:   Obesity unspecified  INTERVENTION:  Boost Breeze po TID, each supplement provides 250 kcal and 9 grams of protein Consider PEG placement if deemed necessary for long term nutrition access   NUTRITION DIAGNOSIS:   Inadequate oral intake related to inability to eat as evidenced by per patient/family report.  GOAL:   Patient will meet greater than or equal to 90% of their needs  MONITOR:   PO intake, Supplement acceptance, Labs, Weight trends, Diet advancement  REASON FOR ASSESSMENT:   Consult Assessment of nutrition requirement/status  ASSESSMENT:   Pt admitted from home with inability to swallow. PMH significant for advanced castrate sensitive metastatic prostates Ca with painful bony mets in the chest wall and L supraclavicular lymph node s/p XRT  Pt sitting up in chair at time of visit. His wife sitting on EOB. Pt coughing up secretions throughout visit. He states that since beginning this last round of radiation around August 7th, he has had a decline in his appetite. He begins to eat and does not usually finish. He has been eating about half of his usual intake (I.e 1 hamburger instead of 2). He reports having a chronic history of reflux which was previously resolved with removing spicy foods from his diet. He was doing well up until yesterday when he reports that the food he was eating felt like it was lodged in his chest. He drank water with aloe in it which helped a little. At this time he is unable to swallow any foods or fluids without difficulty. Only tolerating a couple sips of water.  Pt reports that prior to this round of radiation he was about 266 lbs and reports that this past Friday his weight was 255 lbs. Reviewed weight history. Pt appears to have had an 11.6% weight loss within the last 6 months which is clinically significant for time frame.   Pt does not meet criteria for malnutrition at this time however  given significant weight loss and decreased appetite and ability to swallow, he is at high risk for malnutrition. Discussed patient that given his cancer and cancer treatments, he has increased nutritional needs. Provided explanation of possible alternative nutrition access options, including a PEG, given inability to swallow and need for nutrition. All current questions and concerns addressed. Will add Boost Breeze for now given clear liquid diet so patient can sip on as able.   Spoke with MD. Plans for SLP evaluation and radiation/oncology evaluation. Given his inadequate PO intake and significant weight loss, would recommend PEG placement if unable to obtain adequate PO intake.   Medications: protonix, carafate   Labs: sodium 146 (H), BUN 21, Cr 1.28, alkaline phos 608, AST 11  I/O's: +2475m since admission  NUTRITION - FOCUSED PHYSICAL EXAM:  Flowsheet Row Most Recent Value  Orbital Region No depletion  Upper Arm Region No depletion  Thoracic and Lumbar Region No depletion  Buccal Region No depletion  Temple Region No depletion  Clavicle Bone Region No depletion  Clavicle and Acromion Bone Region No depletion  Scapular Bone Region No depletion  Dorsal Hand No depletion  Patellar Region No depletion  Anterior Thigh Region No depletion  Posterior Calf Region No depletion  Edema (RD Assessment) None  Hair Reviewed  Eyes Reviewed  Mouth Reviewed  Skin Reviewed  Nails Reviewed       Diet Order:   Diet Order             Diet clear liquid  Room service appropriate? Yes; Fluid consistency: Thin  Diet effective now                   EDUCATION NEEDS:   Education needs have been addressed  Skin:  Skin Assessment: Reviewed RN Assessment  Last BM:  8/13  Height:   Ht Readings from Last 1 Encounters:  08/04/22 '5\' 11"'$  (1.803 m)    Weight:   Wt Readings from Last 1 Encounters:  08/04/22 117.9 kg    Ideal Body Weight:  78.2 kg  BMI:  Body mass index is 36.25  kg/m.  Estimated Nutritional Needs:   Kcal:  2500-2700  Protein:  125-140g  Fluid:  >/=2.5L  Clayborne Dana, RDN, LDN Clinical Nutrition

## 2022-08-05 NOTE — Hospital Course (Signed)
Mr. Craig Collins is a 56 y.o. M with advanced castrate sensitive metastatic prostate cancer with bony mets to chest wall and left supraclavicular lymph node recent radiation treatments, and HTN who presented with acute difficulty swallowing    8/14: Admitted, Dr. Collene Mares consulted, went for EGD

## 2022-08-05 NOTE — Assessment & Plan Note (Signed)
Hgb low but stable, no bleeding

## 2022-08-05 NOTE — Assessment & Plan Note (Signed)
BMI 36 in setting of hypertension

## 2022-08-05 NOTE — Progress Notes (Signed)
Transition of Care [facility]) Screening Note  Patient Details  Name: Craig Collins Date of Birth: January 28, 1966  Transition of Care [facility]) CM/SW Contact:    Sherie Don, LCSW Phone Number: 08/05/2022, 11:02 AM  Transition of Care Department [facility]) has reviewed patient and no TOC needs have been identified at this time. We will continue to monitor patient advancement through interdisciplinary progression rounds. If new patient transition needs arise, please place a TOC consult.

## 2022-08-06 LAB — CBC
HCT: 28.6 % — ABNORMAL LOW (ref 39.0–52.0)
Hemoglobin: 8.8 g/dL — ABNORMAL LOW (ref 13.0–17.0)
MCH: 26.3 pg (ref 26.0–34.0)
MCHC: 30.8 g/dL (ref 30.0–36.0)
MCV: 85.4 fL (ref 80.0–100.0)
Platelets: 179 10*3/uL (ref 150–400)
RBC: 3.35 MIL/uL — ABNORMAL LOW (ref 4.22–5.81)
RDW: 16.1 % — ABNORMAL HIGH (ref 11.5–15.5)
WBC: 3 10*3/uL — ABNORMAL LOW (ref 4.0–10.5)
nRBC: 0 % (ref 0.0–0.2)

## 2022-08-06 LAB — COMPREHENSIVE METABOLIC PANEL
ALT: 12 U/L (ref 0–44)
AST: 13 U/L — ABNORMAL LOW (ref 15–41)
Albumin: 3.3 g/dL — ABNORMAL LOW (ref 3.5–5.0)
Alkaline Phosphatase: 582 U/L — ABNORMAL HIGH (ref 38–126)
Anion gap: 7 (ref 5–15)
BUN: 19 mg/dL (ref 6–20)
CO2: 23 mmol/L (ref 22–32)
Calcium: 8.9 mg/dL (ref 8.9–10.3)
Chloride: 118 mmol/L — ABNORMAL HIGH (ref 98–111)
Creatinine, Ser: 1.31 mg/dL — ABNORMAL HIGH (ref 0.61–1.24)
GFR, Estimated: >60 mL/min (ref >60)
Glucose, Bld: 98 mg/dL (ref 70–99)
Potassium: 3.3 mmol/L — ABNORMAL LOW (ref 3.5–5.1)
Sodium: 148 mmol/L — ABNORMAL HIGH (ref 135–145)
Total Bilirubin: 0.4 mg/dL (ref 0.3–1.2)
Total Protein: 6.8 g/dL (ref 6.5–8.1)

## 2022-08-06 MED ORDER — SODIUM CHLORIDE 0.9 % IV SOLN: INTRAVENOUS | Status: DC | PRN

## 2022-08-06 MED ORDER — POTASSIUM CHLORIDE 10 MEQ/100ML IV SOLN
10.0000 meq | INTRAVENOUS | Status: AC
  Administered 2022-08-06 (×4): 10 meq via INTRAVENOUS
  Filled 2022-08-06 (×3): qty 100

## 2022-08-06 NOTE — Evaluation (Signed)
SLP Cancel/Discontinue Note  Patient Details Name: Craig Collins MRN: [id number] DOB: [birth year]   Cancelled treatment:       Reason Eval/Treat Not Completed: Other (comment) (SLP messaged Dr [place] re: pt's esophagitis and GI on board. He advised appropriate to cancel the order and would reconsult if needed. Thank you.)  Kathleen Lime, MS [facility] SLP Acute Rehab Services Office [phone] Pager [phone]  Macario Golds 08/06/2022, 12:54 PM

## 2022-08-06 NOTE — Progress Notes (Signed)
UNASSIGNED PATIENT Subjective: Since I last evaluated the patient, he seems to be doing much better today.  He can swallow liquids and has had 3 cans of boost.  He has had 2 BMs today. He denies having any nausea vomiting or retrosternal pain.  He is very grateful for the care he has received and that the fact that he is feeling much better today.  Objective: Vital signs in last 24 hours: Temp:  [98.2 F (36.8 C)-98.6 F (37 C)] 98.5 F (36.9 C) (08/16 0502) Pulse Rate:  [84-109] 84 (08/16 0502) Resp:  [18] 18 (08/16 0502) BP: (137-167)/(94-102) 146/97 (08/16 0502) SpO2:  [94 %-99 %] 98 % (08/16 0502) Last BM Date : 08/03/22  Intake/Output from previous day: 08/15 0701 - 08/16 0700 In: 2917.9 [P.O.:50; I.V.:2867.9] Out: 2 [Urine:1; Stool:1] Intake/Output this shift: Total I/O In: 664.1 [P.O.:60; I.V.:604.1] Out: -   General appearance: alert, cooperative, appears stated age, and morbidly obese Resp: clear to auscultation bilaterally Cardio: regular rate and rhythm, S1, S2 normal, no murmur, click, rub or gallop GI: soft, morbidly obese non-tender; bowel sounds normal; no masses,  no organomegaly  Lab Results: Recent Labs    08/04/22 1122 08/05/22 0535 08/06/22 0515  WBC 4.9 4.4 3.0*  HGB 10.5* 9.3* 8.8*  HCT 33.0* 29.6* 28.6*  PLT 192 172 179   BMET Recent Labs    08/04/22 1122 08/05/22 0535 08/06/22 0515  NA 143 146* 148*  K 3.2* 3.9 3.3*  CL 111 117* 118*  CO2 '22 22 23  '$ GLUCOSE 105* 99 98  BUN 20 21* 19  CREATININE 1.51* 1.28* 1.31*  CALCIUM 9.5 8.9 8.9   LFT Recent Labs    08/06/22 0515  PROT 6.8  ALBUMIN 3.3*  AST 13*  ALT 12  ALKPHOS 582*  BILITOT 0.4   Studies/Results: DG Chest Port 1 View  Result Date: 08/04/2022 CLINICAL DATA:  Difficulty swallowing, cough EXAM: PORTABLE CHEST 1 VIEW COMPARISON:  Chest radiograph done on 10/01/2006, PET-CT done on 06/10/2022 FINDINGS: Cardiac size is within normal limits. There are no signs of pulmonary  edema or focal pulmonary consolidation. There is no pleural effusion or pneumothorax. There is possible expansile lesion in the posterior left seventh rib. No gross abnormal tracer uptake in the left seventh rib in the previous PET-CT. IMPRESSION: No focal pulmonary infiltrates are seen. There is no pleural effusion or pneumothorax. There is expansile lesion in the posterior left seventh rib is consistent with skeletal metastatic disease. Electronically Signed   By: Elmer Picker M.D.   On: 08/04/2022 12:14    Medications: I have reviewed the patient's current medications. Prior to Admission:  Medications Prior to Admission  Medication Sig Dispense Refill Last Dose   aspirin EC 81 MG tablet Take 81 mg by mouth daily.   08/03/2022   Calcium Carbonate-Vitamin D (CALCIUM 500/D) 500-125 MG-UNIT TABS Take 1 tablet by mouth daily.   08/03/2022   cyclobenzaprine (FLEXERIL) 10 MG tablet Take 1 tablet (10 mg total) by mouth 3 (three) times daily as needed for muscle spasms. 60 tablet 3 Past Month   enzalutamide (XTANDI) 40 MG capsule Take 4 capsules (160 mg total) by mouth daily. 120 capsule 1 08/03/2022   hydrochlorothiazide (HYDRODIURIL) 25 MG tablet TAKE 1/2 TABLET (12.5 MG TOTAL) BY MOUTH DAILY. 45 tablet 3 08/02/2022   ibuprofen (ADVIL) 800 MG tablet TAKE 1 TABLET (800 MG TOTAL) BY MOUTH EVERY 8 (EIGHT) HOURS AS NEEDED. (Patient taking differently: Take 800 mg by mouth every  8 (eight) hours as needed for moderate pain.) 60 tablet 3 Past Week   lisinopril (ZESTRIL) 20 MG tablet TAKE 1 TABLET (20 MG TOTAL) BY MOUTH DAILY. 90 tablet 3 08/03/2022   LORazepam (ATIVAN) 0.5 MG tablet Take 1 tablet by mouth 30 minutes prior to radiation for claustrophobia. 10 tablet 0 Past Week   tamsulosin (FLOMAX) 0.4 MG CAPS capsule TAKE 1 CAPSULE (0.4 MG TOTAL) BY MOUTH DAILY. 30 capsule 11 08/03/2022   vitamin B-12 1000 MCG tablet Take 1 tablet (1,000 mcg total) by mouth daily. 30 tablet 0 08/03/2022   sucralfate (CARAFATE)  1 g tablet Take 1 tablet by mouth 4 times daily -  with meals and at bedtime. Take 5 minutes before meals for radiation induced esophagitis 60 tablet 2    Scheduled:  enzalutamide  160 mg Oral Daily   feeding supplement  1 Container Oral TID BM   hydrochlorothiazide  12.5 mg Oral Daily   lisinopril  20 mg Oral Daily   pantoprazole (PROTONIX) IV  40 mg Intravenous Q12H   sucralfate  1 g Oral TID WC & HS   Continuous:  sodium chloride 10 mL/hr at 08/06/22 1436   lactated ringers 125 mL/hr at 08/06/22 1431   potassium chloride 10 mEq (08/06/22 1438)    Assessment/Plan: 1) Radiation esophagitis improved with PPIs and nitroglycerin along with sucralfate suspension allow.  Continue present care. 2) Chronic kidney disease stage IIIa. 3) Essential hypertension. 4) Anemia associate with chemotherapy -metastatic prostate cancer. 5) Morbid obesity.   LOS: 1 day   Juanita Craver 08/06/2022, 11:47 AM

## 2022-08-06 NOTE — Progress Notes (Signed)
PROGRESS NOTE    [name]  ELF:[id number] DOB: 01-Sep-1966 DOA: 08/04/2022 PCP: Vevelyn Francois, NP    Brief Narrative:   Craig Collins is a 56 y.o. male with past medical history significant for metastatic prostate cancer to bone, chest wall and left supraclavicular lymph node with recent radiation treatments, essential hypertension, CKD stage IIIa who presented to [facility] ED on 8/14 with difficulty swallowing.  In the ED, chest x-ray unrevealing.  Given there is a concern for food impaction, gastroenterology was consulted who recommended EGD.  Patient started on clear liquid diet, IV Protonix, sucralfate.  TRH consulted for further evaluation and management.  8/14: EGD with concern for radiation esophagitis 8/15: DR. Loleta Books discussed case with radiation oncology, Dr. Tammi Klippel; this is a known expected complication and first-line treatment would be sucralfate and viscous lidocaine with most cases resolving within 14 days, fluid support likely necessary next few days and feeding tube would be unusual 8/16: Tolerating clear liquid diet, received 2 doses of sublingual nitroglycerin yesterday  Assessment & Plan:   Radiation esophagitis Patient presenting to ED with complaint of difficulty swallowing.  Recent completed 10 days of radiation and now with new onset dysphagia, choking and globus sensation. GI was consulted and patient underwent EGD on 8/14 by Dr. Collene Mares with findings of LA grade D radiation esophagitis with no bleeding in proximal one third of esophagus, normal-appearing stomach, normal duodenum. --GI following, appreciate assistance --Clear Liquid diet --LR at 125 mL/h --Viscous lidocaine --Sublingual nitroglycerin as needed --Sucralfate 1g PO TIDAC/HS --Protonix 40 mg IV q12h --If symptoms do not improve/resolve, may require PEG/G-tube  Malignant neoplasm of prostate metastatic to lymph nodes of multiple sites Follows with radiation oncology, Dr. Tammi Klippel and medical oncology,  Dr. Alen Blew outpatient. --Xtandi '160mg'$  PO daily  Hypokalemia Repleted during hospitalization.  Potassium 3.3 this morning, will continue repletion. -- Repeat electrolytes a.m. to include magnesium.  CKD stage IIIa --Cr 1.51>1.28>1.31, stable --Avoid nephrotoxins, renal dose all medications  Essential hypertension --Lisinopril 20 mg p.o. daily --HCTZ 12.5 mg p.o. daily --Metoprolol tartrate 2.5 mg IV q6h pRN elevated BP  Anemia associated with chemotherapy --Hgb 10.5>9.3>8.8, stable  Morbid obesity Body mass index is 36.25 kg/m.  Discussed with patient needs for aggressive lifestyle changes/weight loss as this complicates all facets of care.  Outpatient follow-up with PCP.     DVT prophylaxis:     Code Status: Full Code Family Communication:   Disposition Plan:  Level of care: Med-Surg Status is: Inpatient Remains inpatient appropriate because: needs further advancement of diet, may end up needing feeding tube placement, awaiting GI sign off    Consultants:  Gastroenterology, Dr. Adriana Mccallum  Procedures:  EGD 10/14  Antimicrobials:  None   Subjective: Patient seen examined bedside, resting comfortably.  Sitting in bedside chair.  Continues with difficulty swallowing pills, tolerating clear liquid diet.  Discussed with patient if he does not progress may end up needing feeding tube, he is okay if this is necessary.  No other specific questions or concerns at this time.  Denies headache, no dizziness, no chest pain, no shortness of breath, no abdominal pain, no fever/chills/night sweats, no nausea/vomiting/diarrhea, no focal weakness, no cough/congestion, no paresthesias.  No acute events overnight per nurse staff.  Objective: Vitals:   08/05/22 0850 08/05/22 1159 08/05/22 2121 08/06/22 0502  BP: (!) 166/99 (!) 167/102 (!) 137/94 (!) 146/97  Pulse: 91 (!) 109 89 84  Resp: '19 18 18 18  '$ Temp: 98.1 F (36.7 C) 98.2 F (  36.8 C) 98.6 F (37 C) 98.5 F (36.9 C)   TempSrc: Oral Oral Oral Oral  SpO2: 100% 94% 99% 98%  Weight:      Height:        Intake/Output Summary (Last 24 hours) at 08/06/2022 1239 Last data filed at 08/06/2022 0954 Gross per 24 hour  Intake 3085.91 ml  Output 2 ml  Net 3083.91 ml   Filed Weights   08/04/22 1038 08/04/22 1532  Weight: 117.9 kg 117.9 kg    Examination:  Physical Exam: GEN: NAD, alert and oriented x 3, obese HEENT: NCAT, PERRL, EOMI, sclera clear, MMM PULM: CTAB w/o wheezes/crackles, normal respiratory effort, on room air CV: RRR w/o M/G/R GI: abd soft, NTND, NABS, no R/G/M MSK: no peripheral edema, muscle strength globally intact 5/5 bilateral upper/lower extremities NEURO: CN II-XII intact, no focal deficits, sensation to light touch intact PSYCH: normal mood/affect Integumentary: dry/intact, no rashes or wounds    Data Reviewed: I have personally reviewed following labs and imaging studies  CBC: Recent Labs  Lab 08/04/22 1122 08/05/22 0535 08/06/22 0515  WBC 4.9 4.4 3.0*  NEUTROABS 4.0  --   --   HGB 10.5* 9.3* 8.8*  HCT 33.0* 29.6* 28.6*  MCV 83.8 84.8 85.4  PLT 192 172 532   Basic Metabolic Panel: Recent Labs  Lab 08/04/22 1122 08/05/22 0535 08/06/22 0515  NA 143 146* 148*  K 3.2* 3.9 3.3*  CL 111 117* 118*  CO2 '22 22 23  '$ GLUCOSE 105* 99 98  BUN 20 21* 19  CREATININE 1.51* 1.28* 1.31*  CALCIUM 9.5 8.9 8.9  MG  --  2.3  --    GFR: Estimated Creatinine Clearance: 82.2 mL/min (A) (by C-G formula based on SCr of 1.31 mg/dL (H)). Liver Function Tests: Recent Labs  Lab 08/04/22 1122 08/05/22 0535 08/06/22 0515  AST 12* 11* 13*  ALT '11 12 12  '$ ALKPHOS 690* 608* 582*  BILITOT 0.4 0.6 0.4  PROT 8.1 7.2 6.8  ALBUMIN 3.9 3.4* 3.3*   No results for input(s): "LIPASE", "AMYLASE" in the last 168 hours. No results for input(s): "AMMONIA" in the last 168 hours. Coagulation Profile: No results for input(s): "INR", "PROTIME" in the last 168 hours. Cardiac Enzymes: No  results for input(s): "CKTOTAL", "CKMB", "CKMBINDEX", "TROPONINI" in the last 168 hours. BNP (last 3 results) No results for input(s): "PROBNP" in the last 8760 hours. HbA1C: No results for input(s): "HGBA1C" in the last 72 hours. CBG: No results for input(s): "GLUCAP" in the last 168 hours. Lipid Profile: No results for input(s): "CHOL", "HDL", "LDLCALC", "TRIG", "CHOLHDL", "LDLDIRECT" in the last 72 hours. Thyroid Function Tests: No results for input(s): "TSH", "T4TOTAL", "FREET4", "T3FREE", "THYROIDAB" in the last 72 hours. Anemia Panel: No results for input(s): "VITAMINB12", "FOLATE", "FERRITIN", "TIBC", "IRON", "RETICCTPCT" in the last 72 hours. Sepsis Labs: No results for input(s): "PROCALCITON", "LATICACIDVEN" in the last 168 hours.  No results found for this or any previous visit (from the past 240 hour(s)).       Radiology Studies: No results found.      Scheduled Meds:  enzalutamide  160 mg Oral Daily   feeding supplement  1 Container Oral TID BM   hydrochlorothiazide  12.5 mg Oral Daily   lisinopril  20 mg Oral Daily   pantoprazole (PROTONIX) IV  40 mg Intravenous Q12H   sucralfate  1 g Oral TID WC & HS   Continuous Infusions:  lactated ringers 125 mL/hr at 08/04/22 2243  LOS: 1 day    Time spent: 51 minutes spent on chart review, discussion with nursing staff, consultants, updating family and interview/physical exam; more than 50% of that time was spent in counseling and/or coordination of care.    Merrianne Mccumbers J [place] DO [facility] Available via Epic secure chat 7am-7pm After these hours, please refer to coverage provider listed on [email or web address] 08/06/2022, 12:39 PM

## 2022-08-07 ENCOUNTER — Ambulatory Visit: Payer: Self-pay | Admitting: Nurse Practitioner

## 2022-08-07 LAB — BASIC METABOLIC PANEL
Anion gap: 6 (ref 5–15)
BUN: 15 mg/dL (ref 6–20)
CO2: 25 mmol/L (ref 22–32)
Calcium: 9 mg/dL (ref 8.9–10.3)
Chloride: 114 mmol/L — ABNORMAL HIGH (ref 98–111)
Creatinine, Ser: 1.27 mg/dL — ABNORMAL HIGH (ref 0.61–1.24)
GFR, Estimated: >60 mL/min (ref >60)
Glucose, Bld: 100 mg/dL — ABNORMAL HIGH (ref 70–99)
Potassium: 3.1 mmol/L — ABNORMAL LOW (ref 3.5–5.1)
Sodium: 145 mmol/L (ref 135–145)

## 2022-08-07 LAB — MAGNESIUM: Magnesium: 2 mg/dL (ref 1.7–2.4)

## 2022-08-07 MED ORDER — POTASSIUM CHLORIDE 20 MEQ PO PACK
40.0000 meq | PACK | ORAL | Status: AC
Start: 2022-08-07 — End: 2022-08-07
  Administered 2022-08-07 (×2): 40 meq via ORAL
  Filled 2022-08-07 (×2): qty 2

## 2022-08-07 MED ORDER — BIOTENE DRY MOUTH MT LIQD: 15.0000 mL | OROMUCOSAL | Status: DC | PRN

## 2022-08-07 NOTE — Progress Notes (Signed)
Subjective: Feeling better.  His wife reports that he is using the suction much less.  Objective: Vital signs in last 24 hours: Temp:  [97.9 F (36.6 C)-99.1 F (37.3 C)] 99.1 F (37.3 C) (08/17 1319) Pulse Rate:  [74-95] 95 (08/17 1319) Resp:  [17-18] 17 (08/17 0511) BP: (145-160)/(99-101) 145/100 (08/17 1319) SpO2:  [99 %-100 %] 99 % (08/17 1319) Last BM Date : 08/06/22  Intake/Output from previous day: 08/16 0701 - 08/17 0700 In: 3916.5 [P.O.:1020; I.V.:2500.5; IV Piggyback:395.9] Out: -  Intake/Output this shift: Total I/O In: 720 [P.O.:720] Out: -   General appearance: alert and no distress GI: soft, non-tender; bowel sounds normal; no masses,  no organomegaly  Lab Results: Recent Labs    08/05/22 0535 08/06/22 0515  WBC 4.4 3.0*  HGB 9.3* 8.8*  HCT 29.6* 28.6*  PLT 172 179   BMET Recent Labs    08/05/22 0535 08/06/22 0515 08/07/22 0446  NA 146* 148* 145  K 3.9 3.3* 3.1*  CL 117* 118* 114*  CO2 '22 23 25  '$ GLUCOSE 99 98 100*  BUN 21* 19 15  CREATININE 1.28* 1.31* 1.27*  CALCIUM 8.9 8.9 9.0   LFT Recent Labs    08/06/22 0515  PROT 6.8  ALBUMIN 3.3*  AST 13*  ALT 12  ALKPHOS 582*  BILITOT 0.4   PT/INR No results for input(s): "LABPROT", "INR" in the last 72 hours. Hepatitis Panel No results for input(s): "HEPBSAG", "HCVAB", "HEPAIGM", "HEPBIGM" in the last 72 hours. C-Diff No results for input(s): "CDIFFTOX" in the last 72 hours. Fecal Lactopherrin No results for input(s): "FECLLACTOFRN" in the last 72 hours.  Studies/Results: No results found.  Medications: Scheduled:  enzalutamide  160 mg Oral Daily   feeding supplement  1 Container Oral TID BM   hydrochlorothiazide  12.5 mg Oral Daily   lisinopril  20 mg Oral Daily   pantoprazole (PROTONIX) IV  40 mg Intravenous Q12H   sucralfate  1 g Oral TID WC & HS   Continuous:  sodium chloride Stopped (08/06/22 2222)   lactated ringers 75 mL/hr at 08/07/22 0912    Assessment/Plan: 1)  Esophageal spasm. 2) Radiation esophagitis. 3) Metastatic prostate cancer.   The patient continues to improve.  He did not need to use any lidocaine and he does not use any NTG.  He is fearful to advance his diet, but he is willing to try full liquids tomorrow.  Plan: 1) Full liquid diet tomorrow. 2) If the tolerates he can be discharged home. 3) Continue with sucralfate. 4) Continue with pantorpazole.  LOS: 2 days   Concetta Guion D 08/07/2022, 3:11 PM

## 2022-08-07 NOTE — Progress Notes (Signed)
PROGRESS NOTE    Craig Collins  HLK:[id number] DOB: 07-30-1966 DOA: 08/04/2022 PCP: Vevelyn Francois, NP    Brief Narrative:   Craig Collins is a 56 y.o. male with past medical history significant for metastatic prostate cancer to bone, chest wall and left supraclavicular lymph node with recent radiation treatments, essential hypertension, CKD stage IIIa who presented to [facility] ED on 8/14 with difficulty swallowing.  In the ED, chest x-ray unrevealing.  Given there is a concern for food impaction, gastroenterology was consulted who recommended EGD.  Patient started on clear liquid diet, IV Protonix, sucralfate.  TRH consulted for further evaluation and management.  8/14: EGD with concern for radiation esophagitis 8/15: DR. Loleta Books discussed case with radiation oncology, Dr. Tammi Klippel; this is a known expected complication and first-line treatment would be sucralfate and viscous lidocaine with most cases resolving within 14 days, fluid support likely necessary next few days and feeding tube would be unusual 8/16: Tolerating clear liquid diet, received 2 doses of sublingual nitroglycerin yesterday  Assessment & Plan:   Radiation esophagitis Patient presenting to ED with complaint of difficulty swallowing.  Recent completed 10 days of radiation and now with new onset dysphagia, choking and globus sensation. GI was consulted and patient underwent EGD on 8/14 by Dr. Collene Mares with findings of LA grade D radiation esophagitis with no bleeding in proximal one third of esophagus, normal-appearing stomach, normal duodenum. --GI following, appreciate assistance --Clear Liquid diet; further advancement per GI --LR at 125 mL/h --Viscous lidocaine --Sublingual nitroglycerin as needed --Sucralfate 1g PO TIDAC/HS --Protonix 40 mg IV q12h --If symptoms do not improve/resolve, may require PEG/G-tube  Malignant neoplasm of prostate metastatic to lymph nodes of multiple sites Follows with radiation oncology, Dr.  Tammi Klippel and medical oncology, Dr. Alen Blew outpatient. --Xtandi '160mg'$  PO daily  Hypokalemia Repleted during hospitalization.  Potassium 3.1, magnesium 2.0 this morning, will continue repletion. --Repeat electrolytes a.m. to include magnesium.  CKD stage IIIa --Cr 1.51>1.28>1.31>1.27, stable --Avoid nephrotoxins, renal dose all medications  Essential hypertension --Lisinopril 20 mg p.o. daily --HCTZ 12.5 mg p.o. daily --Metoprolol tartrate 2.5 mg IV q6h pRN elevated BP  Anemia associated with chemotherapy --Hgb 10.5>9.3>8.8, stable  Morbid obesity Body mass index is 36.25 kg/m.  Discussed with patient needs for aggressive lifestyle changes/weight loss as this complicates all facets of care.  Outpatient follow-up with PCP.     DVT prophylaxis: Place and maintain sequential compression device Start: 08/07/22 1154; ambulation   Code Status: Full Code Family Communication: No family present at bedside this morning  Disposition Plan:  Level of care: Med-Surg Status is: Inpatient Remains inpatient appropriate because: needs further advancement of diet, may end up needing feeding tube placement, awaiting GI sign off    Consultants:  Gastroenterology, Dr. Adriana Mccallum  Procedures:  EGD 10/14  Antimicrobials:  None   Subjective: Patient seen examined bedside, resting comfortably.  Sitting in bedside chair.  Reports dysphagia slowly improving, tolerating clear liquid diet.  No other specific questions or concerns at this time.  Denies headache, no dizziness, no chest pain, no shortness of breath, no abdominal pain, no fever/chills/night sweats, no nausea/vomiting/diarrhea, no focal weakness, no cough/congestion, no paresthesias.  No acute events overnight per nurse staff.  Awaiting further recommendations from GI, diet advancement.  Objective: Vitals:   08/06/22 0502 08/06/22 1405 08/06/22 2148 08/07/22 0511  BP: (!) 146/97 (!) 154/99 (!) 160/99 (!) 153/101  Pulse: 84 83 75 74   Resp: '18 17 18 17  '$ Temp: 98.5 F (  36.9 C) 98.5 F (36.9 C) 97.9 F (36.6 C) 98.5 F (36.9 C)  TempSrc: Oral  Oral Oral  SpO2: 98% 100% 100% 100%  Weight:      Height:        Intake/Output Summary (Last 24 hours) at 08/07/2022 1153 Last data filed at 08/07/2022 1000 Gross per 24 hour  Intake 3612.36 ml  Output --  Net 3612.36 ml   Filed Weights   08/04/22 1038 08/04/22 1532  Weight: 117.9 kg 117.9 kg    Examination:  Physical Exam: GEN: NAD, alert and oriented x 3, obese HEENT: NCAT, PERRL, EOMI, sclera clear, MMM PULM: CTAB w/o wheezes/crackles, normal respiratory effort, on room air CV: RRR w/o M/G/R GI: abd soft, NTND, NABS, no R/G/M MSK: no peripheral edema, muscle strength globally intact 5/5 bilateral upper/lower extremities NEURO: CN II-XII intact, no focal deficits, sensation to light touch intact PSYCH: normal mood/affect Integumentary: dry/intact, no rashes or wounds    Data Reviewed: I have personally reviewed following labs and imaging studies  CBC: Recent Labs  Lab 08/04/22 1122 08/05/22 0535 08/06/22 0515  WBC 4.9 4.4 3.0*  NEUTROABS 4.0  --   --   HGB 10.5* 9.3* 8.8*  HCT 33.0* 29.6* 28.6*  MCV 83.8 84.8 85.4  PLT 192 172 924   Basic Metabolic Panel: Recent Labs  Lab 08/04/22 1122 08/05/22 0535 08/06/22 0515 08/07/22 0446  NA 143 146* 148* 145  K 3.2* 3.9 3.3* 3.1*  CL 111 117* 118* 114*  CO2 '22 22 23 25  '$ GLUCOSE 105* 99 98 100*  BUN 20 21* 19 15  CREATININE 1.51* 1.28* 1.31* 1.27*  CALCIUM 9.5 8.9 8.9 9.0  MG  --  2.3  --  2.0   GFR: Estimated Creatinine Clearance: 84.8 mL/min (A) (by C-G formula based on SCr of 1.27 mg/dL (H)). Liver Function Tests: Recent Labs  Lab 08/04/22 1122 08/05/22 0535 08/06/22 0515  AST 12* 11* 13*  ALT '11 12 12  '$ ALKPHOS 690* 608* 582*  BILITOT 0.4 0.6 0.4  PROT 8.1 7.2 6.8  ALBUMIN 3.9 3.4* 3.3*   No results for input(s): "LIPASE", "AMYLASE" in the last 168 hours. No results for input(s):  "AMMONIA" in the last 168 hours. Coagulation Profile: No results for input(s): "INR", "PROTIME" in the last 168 hours. Cardiac Enzymes: No results for input(s): "CKTOTAL", "CKMB", "CKMBINDEX", "TROPONINI" in the last 168 hours. BNP (last 3 results) No results for input(s): "PROBNP" in the last 8760 hours. HbA1C: No results for input(s): "HGBA1C" in the last 72 hours. CBG: No results for input(s): "GLUCAP" in the last 168 hours. Lipid Profile: No results for input(s): "CHOL", "HDL", "LDLCALC", "TRIG", "CHOLHDL", "LDLDIRECT" in the last 72 hours. Thyroid Function Tests: No results for input(s): "TSH", "T4TOTAL", "FREET4", "T3FREE", "THYROIDAB" in the last 72 hours. Anemia Panel: No results for input(s): "VITAMINB12", "FOLATE", "FERRITIN", "TIBC", "IRON", "RETICCTPCT" in the last 72 hours. Sepsis Labs: No results for input(s): "PROCALCITON", "LATICACIDVEN" in the last 168 hours.  No results found for this or any previous visit (from the past 240 hour(s)).       Radiology Studies: No results found.      Scheduled Meds:  enzalutamide  160 mg Oral Daily   feeding supplement  1 Container Oral TID BM   hydrochlorothiazide  12.5 mg Oral Daily   lisinopril  20 mg Oral Daily   pantoprazole (PROTONIX) IV  40 mg Intravenous Q12H   potassium chloride  40 mEq Oral Q3H   sucralfate  1 g Oral TID WC & HS   Continuous Infusions:  sodium chloride Stopped (08/06/22 2222)   lactated ringers 75 mL/hr at 08/07/22 0912     LOS: 2 days    Time spent: 47 minutes spent on chart review, discussion with nursing staff, consultants, updating family and interview/physical exam; more than 50% of that time was spent in counseling and/or coordination of care.    Craig Nuon J [place] DO [facility] Available via Epic secure chat 7am-7pm After these hours, please refer to coverage provider listed on [email or web address] 08/07/2022, 11:53 AM

## 2022-08-07 NOTE — Progress Notes (Signed)
At bedside to place PIV per request.  Pt currently in the bathroom.

## 2022-08-08 ENCOUNTER — Other Ambulatory Visit (HOSPITAL_COMMUNITY): Payer: Self-pay

## 2022-08-08 LAB — BASIC METABOLIC PANEL
Anion gap: 7 (ref 5–15)
BUN: 13 mg/dL (ref 6–20)
CO2: 23 mmol/L (ref 22–32)
Calcium: 9 mg/dL (ref 8.9–10.3)
Chloride: 115 mmol/L — ABNORMAL HIGH (ref 98–111)
Creatinine, Ser: 1.36 mg/dL — ABNORMAL HIGH (ref 0.61–1.24)
GFR, Estimated: >60 mL/min (ref >60)
Glucose, Bld: 108 mg/dL — ABNORMAL HIGH (ref 70–99)
Potassium: 3.1 mmol/L — ABNORMAL LOW (ref 3.5–5.1)
Sodium: 145 mmol/L (ref 135–145)

## 2022-08-08 LAB — MAGNESIUM: Magnesium: 1.8 mg/dL (ref 1.7–2.4)

## 2022-08-08 MED ORDER — POTASSIUM CHLORIDE 20 MEQ PO PACK
40.0000 meq | PACK | ORAL | Status: AC
Start: 2022-08-08 — End: 2022-08-08
  Administered 2022-08-08 (×2): 40 meq via ORAL
  Filled 2022-08-08 (×2): qty 2

## 2022-08-08 MED ORDER — SUCRALFATE 1 GM/10ML PO SUSP
1.0000 g | Freq: Three times a day (TID) | ORAL | 2 refills | Status: DC
  Filled 2022-08-08 – 2022-11-04 (×3): qty 1200, 30d supply, fill #0

## 2022-08-08 MED ORDER — PANTOPRAZOLE SODIUM 40 MG PO TBEC
40.0000 mg | DELAYED_RELEASE_TABLET | Freq: Every day | ORAL | 2 refills | Status: DC
  Filled 2022-08-08: qty 30, 30d supply, fill #0
  Filled 2022-10-22: qty 30, 30d supply, fill #1

## 2022-08-08 MED ORDER — NITROGLYCERIN 0.4 MG SL SUBL
0.4000 mg | SUBLINGUAL_TABLET | SUBLINGUAL | 0 refills | Status: DC | PRN
  Filled 2022-08-08: qty 25, 5d supply, fill #0

## 2022-08-08 MED ORDER — LIDOCAINE VISCOUS HCL 2 % MT SOLN
15.0000 mL | OROMUCOSAL | 1 refills | Status: DC | PRN
  Filled 2022-08-08: qty 100, 1d supply, fill #0

## 2022-08-08 MED ORDER — MAGNESIUM SULFATE 2 GM/50ML IV SOLN
2.0000 g | Freq: Once | INTRAVENOUS | Status: AC
  Administered 2022-08-08: 2 g via INTRAVENOUS
  Filled 2022-08-08: qty 50

## 2022-08-08 NOTE — Progress Notes (Signed)
PROGRESS NOTE    Craig Collins  IZT:[id number] DOB: [birth year] DOA: 08/04/2022 PCP: Vevelyn Francois, NP    Brief Narrative:   Craig Collins is a 56 y.o. male with past medical history significant for metastatic prostate cancer to bone, chest wall and left supraclavicular lymph node with recent radiation treatments, essential hypertension, CKD stage IIIa who presented to [facility] ED on 8/14 with difficulty swallowing.  In the ED, chest x-ray unrevealing.  Given there is a concern for food impaction, gastroenterology was consulted who recommended EGD.  Patient started on clear liquid diet, IV Protonix, sucralfate.  TRH consulted for further evaluation and management.  8/14: EGD with concern for radiation esophagitis 8/15: DR. Loleta Books discussed case with radiation oncology, Dr. Tammi Klippel; this is a known expected complication and first-line treatment would be sucralfate and viscous lidocaine with most cases resolving within 14 days, fluid support likely necessary next few days and feeding tube would be unusual 8/16: Tolerating clear liquid diet, received 2 doses of sublingual nitroglycerin yesterday  Assessment & Plan:   Radiation esophagitis Patient presenting to ED with complaint of difficulty swallowing.  Recent completed 10 days of radiation and now with new onset dysphagia, choking and globus sensation. GI was consulted and patient underwent EGD on 8/14 by Dr. Collene Mares with findings of LA grade D radiation esophagitis with no bleeding in proximal one third of esophagus, normal-appearing stomach, normal duodenum. --GI now signed off --Full code diet --LR at 125 mL/h --Viscous lidocaine --Sublingual nitroglycerin as needed --Sucralfate 1g PO TIDAC/HS --Protonix 40 mg IV q12h  Malignant neoplasm of prostate metastatic to lymph nodes of multiple sites Follows with radiation oncology, Dr. Tammi Klippel and medical oncology, Dr. Alen Blew outpatient. --Xtandi '160mg'$  PO daily  Hypokalemia Repleted during  hospitalization.  Potassium 3.1, magnesium 1.8 this morning, will continue repletion.  CKD stage IIIa --Cr 1.51>1.28>1.31>1.27>1.36, stable --Avoid nephrotoxins, renal dose all medications  Essential hypertension --Lisinopril 20 mg p.o. daily --HCTZ 12.5 mg p.o. daily --Metoprolol tartrate 2.5 mg IV q6h pRN elevated BP  Anemia associated with chemotherapy --Hgb 10.5>9.3>8.8, stable  Morbid obesity Body mass index is 36.25 kg/m.  Discussed with patient needs for aggressive lifestyle changes/weight loss as this complicates all facets of care.  Outpatient follow-up with PCP.     DVT prophylaxis: Place and maintain sequential compression device Start: 08/07/22 1154; ambulation   Code Status: Full Code Family Communication: No family present at bedside this morning  Disposition Plan:  Level of care: Med-Surg Status is: Inpatient Remains inpatient appropriate because: Possible discharge today if tolerates advanced diet to full liquids    Consultants:  Gastroenterology, Dr. Adriana Mccallum -signed off 8/18  Procedures:  EGD 10/14  Antimicrobials:  None   Subjective: Patient seen examined bedside, resting comfortably.  Sitting in bedside chair.  Awaiting to start full liquid diet this afternoon.  Seen by GI this morning, Dr. Benson Norway who states if tolerates for liquid diet may be able to discharge this evening.  Patient reports dysphagia much improved.  No other specific questions or concerns at this time.  Denies headache, no dizziness, no chest pain, no shortness of breath, no abdominal pain, no fever/chills/night sweats, no nausea/vomiting/diarrhea, no focal weakness, no cough/congestion, no paresthesias.  No acute events overnight per nursing staff.    Objective: Vitals:   08/07/22 0511 08/07/22 1319 08/07/22 2148 08/08/22 0424  BP: (!) 153/101 (!) 145/100 (!) 143/102 (!) 151/97  Pulse: 74 95 85 82  Resp: '17  18 16  '$ Temp: 98.5 F (  36.9 C) 99.1 F (37.3 C) 98.7 F (37.1 C) 98.6 F  (37 C)  TempSrc: Oral Oral Oral Oral  SpO2: 100% 99% 99% 99%  Weight:      Height:        Intake/Output Summary (Last 24 hours) at 08/08/2022 1156 Last data filed at 08/08/2022 1000 Gross per 24 hour  Intake 2502.97 ml  Output --  Net 2502.97 ml   Filed Weights   08/04/22 1038 08/04/22 1532  Weight: 117.9 kg 117.9 kg    Examination:  Physical Exam: GEN: NAD, alert and oriented x 3, obese HEENT: NCAT, PERRL, EOMI, sclera clear, MMM PULM: CTAB w/o wheezes/crackles, normal respiratory effort, on room air CV: RRR w/o M/G/R GI: abd soft, NTND, NABS, no R/G/M MSK: no peripheral edema, muscle strength globally intact 5/5 bilateral upper/lower extremities NEURO: CN II-XII intact, no focal deficits, sensation to light touch intact PSYCH: normal mood/affect Integumentary: dry/intact, no rashes or wounds    Data Reviewed: I have personally reviewed following labs and imaging studies  CBC: Recent Labs  Lab 08/04/22 1122 08/05/22 0535 08/06/22 0515  WBC 4.9 4.4 3.0*  NEUTROABS 4.0  --   --   HGB 10.5* 9.3* 8.8*  HCT 33.0* 29.6* 28.6*  MCV 83.8 84.8 85.4  PLT 192 172 917   Basic Metabolic Panel: Recent Labs  Lab 08/04/22 1122 08/05/22 0535 08/06/22 0515 08/07/22 0446 08/08/22 0451  NA 143 146* 148* 145 145  K 3.2* 3.9 3.3* 3.1* 3.1*  CL 111 117* 118* 114* 115*  CO2 '22 22 23 25 23  '$ GLUCOSE 105* 99 98 100* 108*  BUN 20 21* '19 15 13  '$ CREATININE 1.51* 1.28* 1.31* 1.27* 1.36*  CALCIUM 9.5 8.9 8.9 9.0 9.0  MG  --  2.3  --  2.0 1.8   GFR: Estimated Creatinine Clearance: 79.2 mL/min (A) (by C-G formula based on SCr of 1.36 mg/dL (H)). Liver Function Tests: Recent Labs  Lab 08/04/22 1122 08/05/22 0535 08/06/22 0515  AST 12* 11* 13*  ALT '11 12 12  '$ ALKPHOS 690* 608* 582*  BILITOT 0.4 0.6 0.4  PROT 8.1 7.2 6.8  ALBUMIN 3.9 3.4* 3.3*   No results for input(s): "LIPASE", "AMYLASE" in the last 168 hours. No results for input(s): "AMMONIA" in the last 168  hours. Coagulation Profile: No results for input(s): "INR", "PROTIME" in the last 168 hours. Cardiac Enzymes: No results for input(s): "CKTOTAL", "CKMB", "CKMBINDEX", "TROPONINI" in the last 168 hours. BNP (last 3 results) No results for input(s): "PROBNP" in the last 8760 hours. HbA1C: No results for input(s): "HGBA1C" in the last 72 hours. CBG: No results for input(s): "GLUCAP" in the last 168 hours. Lipid Profile: No results for input(s): "CHOL", "HDL", "LDLCALC", "TRIG", "CHOLHDL", "LDLDIRECT" in the last 72 hours. Thyroid Function Tests: No results for input(s): "TSH", "T4TOTAL", "FREET4", "T3FREE", "THYROIDAB" in the last 72 hours. Anemia Panel: No results for input(s): "VITAMINB12", "FOLATE", "FERRITIN", "TIBC", "IRON", "RETICCTPCT" in the last 72 hours. Sepsis Labs: No results for input(s): "PROCALCITON", "LATICACIDVEN" in the last 168 hours.  No results found for this or any previous visit (from the past 240 hour(s)).       Radiology Studies: No results found.      Scheduled Meds:  enzalutamide  160 mg Oral Daily   feeding supplement  1 Container Oral TID BM   hydrochlorothiazide  12.5 mg Oral Daily   lisinopril  20 mg Oral Daily   pantoprazole (PROTONIX) IV  40 mg Intravenous Q12H  potassium chloride  40 mEq Oral Q3H   sucralfate  1 g Oral TID WC & HS   Continuous Infusions:  sodium chloride Stopped (08/06/22 2222)   lactated ringers 75 mL/hr at 08/07/22 1953     LOS: 3 days    Time spent: 47 minutes spent on chart review, discussion with nursing staff, consultants, updating family and interview/physical exam; more than 50% of that time was spent in counseling and/or coordination of care.    Eliyah Bazzi J [place] DO [facility] Available via Epic secure chat 7am-7pm After these hours, please refer to coverage provider listed on [email or web address] 08/08/2022, 11:56 AM

## 2022-08-08 NOTE — Discharge Planning (Signed)
Discharge instructions given to pt.  All questions answered.  Pt stable at time of discharge. Meds returned to pt with belongings.  Pt stable at time of discharge.

## 2022-08-08 NOTE — Progress Notes (Signed)
Mobility Specialist - Progress Note   08/08/22 1330  Mobility  Activity Ambulated independently in hallway  Level of Assistance Independent after set-up  Assistive Device None  Distance Ambulated (ft) 200 ft  Activity Response Tolerated well  $Mobility charge 1 Mobility   Pt received on bench and agreed to mobilize. No c/o pain or dizziness. Pt left on bench with family in room and all necessities within reach.   Roderick Pee Mobility Specialist

## 2022-08-08 NOTE — Discharge Summary (Signed)
Physician Discharge Summary  RANGER PETRICH ASN:[id number] DOB: 09-08-1966 DOA: 08/04/2022  PCP: Vevelyn Francois, NP  Admit date: 08/04/2022 Discharge date: 08/08/2022  Admitted From: Home Disposition: Home  Recommendations for Outpatient Follow-up:  Follow up with PCP in 1-2 weeks Follow-up with GI, Dr. Benson Norway as needed Continue to follow-up with radiation oncology as scheduled Charged on sublingual nitroglycerin as needed, lidocaine oral solution, Protonix, Carafate to use as needed for radiation esophagitis  Home Health: No Equipment/Devices: None  Discharge Condition: Stable CODE STATUS: Full code Diet recommendation: Full liquid diet, can further advance to soft diet once tolerates  History of present illness:  Craig Collins is a 56 y.o. male with past medical history significant for metastatic prostate cancer to bone, chest wall and left supraclavicular lymph node with recent radiation treatments, essential hypertension, CKD stage IIIa who presented to [facility] ED on 8/14 with difficulty swallowing.  In the ED, chest x-ray unrevealing.  Given there is a concern for food impaction, gastroenterology was consulted who recommended EGD.  Patient started on clear liquid diet, IV Protonix, sucralfate.  TRH consulted for further evaluation and management.   8/14: EGD with concern for radiation esophagitis 8/15: DR. Loleta Books discussed case with radiation oncology, Dr. Tammi Klippel; this is a known expected complication and first-line treatment would be sucralfate and viscous lidocaine with most cases resolving within 14 days, fluid support likely necessary next few days and feeding tube would be unusual 8/16: Tolerating clear liquid diet, received 2 doses of sublingual nitroglycerin yesterday 8/17: Diet advanced to full liquid by GI 8/18: Tolerating full liquid diet and okay for discharge home per GI  Hospital course:  Radiation esophagitis Patient presenting to ED with complaint of difficulty  swallowing.  Recent completed 10 days of radiation and now with new onset dysphagia, choking and globus sensation. GI was consulted and patient underwent EGD on 8/14 by Dr. Collene Mares with findings of LA grade D radiation esophagitis with no bleeding in proximal one third of esophagus, normal-appearing stomach, normal duodenum.  Patient was initially started on a clear liquid diet and advance to full liquids with toleration.  Okay for discharge home per GI with continued viscous lidocaine, sublingual nitroglycerin, Carafate and Protonix.  Outpatient follow-up with GI as needed and radiation oncology.   Malignant neoplasm of prostate metastatic to lymph nodes of multiple sites Follows with radiation oncology, Dr. Tammi Klippel and medical oncology, Dr. Alen Blew outpatient.  Continue Xtandi '160mg'$  PO daily   Hypokalemia Repleted during hospitalization.   CKD stage IIIa [place] stable, 1.36 at time of discharge.   Essential hypertension Can you home lisinopril 20 mg p.o. daily, HCTZ 12.5 mg p.o. daily, Metoprolol tartrate 2.5 mg IV q6h pRN elevated BP   Anemia associated with chemotherapy Hemoglobin stable, 8.8 at time of discharge.   Morbid obesity Body mass index is 36.25 kg/m.  Discussed with patient needs for aggressive lifestyle changes/weight loss as this complicates all facets of care.  Outpatient follow-up with PCP.   Discharge Diagnoses:  Principal Problem:   Radiation esophagitis Active Problems:   Hypertension, essential   Stage 3a chronic kidney disease (CKD) (HCC)   Malignant neoplasm of prostate metastatic to lymph nodes of multiple sites (HCC)   Hypokalemia   Anemia associated with chemotherapy   Morbid obesity [facility])    Discharge Instructions  Discharge Instructions     Call MD for:  difficulty breathing, headache or visual disturbances   Complete by: As directed    Call MD for:  extreme  fatigue   Complete by: As directed    Call MD for:  persistant dizziness or light-headedness    Complete by: As directed    Call MD for:  persistant nausea and vomiting   Complete by: As directed    Call MD for:  severe uncontrolled pain   Complete by: As directed    Call MD for:  temperature >100.4   Complete by: As directed    Diet - low sodium heart healthy   Complete by: As directed    Increase activity slowly   Complete by: As directed       Allergies as of 08/08/2022   No Known Allergies      Medication List     STOP taking these medications    sucralfate 1 g tablet Commonly known as: Carafate Replaced by: sucralfate 1 GM/10ML suspension       TAKE these medications    aspirin EC 81 MG tablet Take 81 mg by mouth daily.   Calcium 500/D 500-3.125 MG-MCG Tabs Generic drug: Calcium Carb-Cholecalciferol Take 1 tablet by mouth daily.   cyanocobalamin 1000 MCG tablet Take 1 tablet (1,000 mcg total) by mouth daily.   cyclobenzaprine 10 MG tablet Commonly known as: FLEXERIL Take 1 tablet (10 mg total) by mouth 3 (three) times daily as needed for muscle spasms.   hydrochlorothiazide 25 MG tablet Commonly known as: HYDRODIURIL TAKE 1/2 TABLET (12.5 MG TOTAL) BY MOUTH DAILY.   ibuprofen 800 MG tablet Commonly known as: ADVIL TAKE 1 TABLET (800 MG TOTAL) BY MOUTH EVERY 8 (EIGHT) HOURS AS NEEDED. What changed:  how much to take reasons to take this   lidocaine 2 % solution Commonly known as: XYLOCAINE Use as directed 15 mLs in the mouth or throat every 4 (four) hours as needed (esophagitis discomfort).   lisinopril 20 MG tablet Commonly known as: ZESTRIL TAKE 1 TABLET (20 MG TOTAL) BY MOUTH DAILY.   LORazepam 0.5 MG tablet Commonly known as: Ativan Take 1 tablet by mouth 30 minutes prior to radiation for claustrophobia.   nitroGLYCERIN 0.4 MG SL tablet Commonly known as: NITROSTAT Place 1 tablet (0.4 mg total) under the tongue every 5 (five) minutes as needed for chest pain (pain with swallowing food).   pantoprazole 40 MG tablet Commonly  known as: Protonix Take 1 tablet (40 mg total) by mouth daily.   sucralfate 1 GM/10ML suspension Commonly known as: CARAFATE Take 10 mLs (1 g total) by mouth 4 (four) times daily -  with meals and at bedtime. Replaces: sucralfate 1 g tablet   tamsulosin 0.4 MG Caps capsule Commonly known as: FLOMAX TAKE 1 CAPSULE (0.4 MG TOTAL) BY MOUTH DAILY.   Xtandi 40 MG capsule Generic drug: enzalutamide Take 4 capsules (160 mg total) by mouth daily.        Follow-up Information     Vevelyn Francois, NP. Schedule an appointment as soon as possible for a visit in 1 week(s).   Specialty: Adult Health Nurse Practitioner Contact information: [address] [place] [address] [phone]         Carol Ada, MD. Schedule an appointment as soon as possible for a visit.   Specialty: Gastroenterology Contact information: [address] [place] [address] [phone]                No Known Allergies  Consultations: Gastroenterology, Dr. Mann/Dr. Benson Norway   Procedures/Studies: DG Chest Port 1 View  Result Date: 08/04/2022 CLINICAL DATA:  Difficulty  swallowing, cough EXAM: PORTABLE CHEST 1 VIEW COMPARISON:  Chest radiograph done on 10/01/2006, PET-CT done on 06/10/2022 FINDINGS: Cardiac size is within normal limits. There are no signs of pulmonary edema or focal pulmonary consolidation. There is no pleural effusion or pneumothorax. There is possible expansile lesion in the posterior left seventh rib. No gross abnormal tracer uptake in the left seventh rib in the previous PET-CT. IMPRESSION: No focal pulmonary infiltrates are seen. There is no pleural effusion or pneumothorax. There is expansile lesion in the posterior left seventh rib is consistent with skeletal metastatic disease. Electronically Signed   By: Elmer Picker M.D.   On: 08/04/2022 12:14     Subjective:   Discharge Exam: Vitals:   08/07/22 2148 08/08/22 0424  BP: (!) 143/102 (!)  151/97  Pulse: 85 82  Resp: 18 16  Temp: 98.7 F (37.1 C) 98.6 F (37 C)  SpO2: 99% 99%   Vitals:   08/07/22 0511 08/07/22 1319 08/07/22 2148 08/08/22 0424  BP: (!) 153/101 (!) 145/100 (!) 143/102 (!) 151/97  Pulse: 74 95 85 82  Resp: '17  18 16  '$ Temp: 98.5 F (36.9 C) 99.1 F (37.3 C) 98.7 F (37.1 C) 98.6 F (37 C)  TempSrc: Oral Oral Oral Oral  SpO2: 100% 99% 99% 99%  Weight:      Height:        Physical Exam: GEN: NAD, alert and oriented x 3, obese HEENT: NCAT, PERRL, EOMI, sclera clear, MMM PULM: CTAB w/o wheezes/crackles, normal respiratory effort, on room air CV: RRR w/o M/G/R GI: abd soft, NTND, NABS, no R/G/M MSK: no peripheral edema, muscle strength globally intact 5/5 bilateral upper/lower extremities NEURO: CN II-XII intact, no focal deficits, sensation to light touch intact PSYCH: normal mood/affect Integumentary: dry/intact, no rashes or wounds    The results of significant diagnostics from this hospitalization (including imaging, microbiology, ancillary and laboratory) are listed below for reference.     Microbiology: No results found for this or any previous visit (from the past 240 hour(s)).   Labs: BNP (last 3 results) No results for input(s): "BNP" in the last 8760 hours. Basic Metabolic Panel: Recent Labs  Lab 08/04/22 1122 08/05/22 0535 08/06/22 0515 08/07/22 0446 08/08/22 0451  NA 143 146* 148* 145 145  K 3.2* 3.9 3.3* 3.1* 3.1*  CL 111 117* 118* 114* 115*  CO2 '22 22 23 25 23  '$ GLUCOSE 105* 99 98 100* 108*  BUN 20 21* '19 15 13  '$ CREATININE 1.51* 1.28* 1.31* 1.27* 1.36*  CALCIUM 9.5 8.9 8.9 9.0 9.0  MG  --  2.3  --  2.0 1.8   Liver Function Tests: Recent Labs  Lab 08/04/22 1122 08/05/22 0535 08/06/22 0515  AST 12* 11* 13*  ALT '11 12 12  '$ ALKPHOS 690* 608* 582*  BILITOT 0.4 0.6 0.4  PROT 8.1 7.2 6.8  ALBUMIN 3.9 3.4* 3.3*   No results for input(s): "LIPASE", "AMYLASE" in the last 168 hours. No results for input(s):  "AMMONIA" in the last 168 hours. CBC: Recent Labs  Lab 08/04/22 1122 08/05/22 0535 08/06/22 0515  WBC 4.9 4.4 3.0*  NEUTROABS 4.0  --   --   HGB 10.5* 9.3* 8.8*  HCT 33.0* 29.6* 28.6*  MCV 83.8 84.8 85.4  PLT 192 172 179   Cardiac Enzymes: No results for input(s): "CKTOTAL", "CKMB", "CKMBINDEX", "TROPONINI" in the last 168 hours. BNP: Invalid input(s): "POCBNP" CBG: No results for input(s): "GLUCAP" in the last 168 hours. D-Dimer No results for  input(s): "DDIMER" in the last 72 hours. Hgb A1c No results for input(s): "HGBA1C" in the last 72 hours. Lipid Profile No results for input(s): "CHOL", "HDL", "LDLCALC", "TRIG", "CHOLHDL", "LDLDIRECT" in the last 72 hours. Thyroid function studies No results for input(s): "TSH", "T4TOTAL", "T3FREE", "THYROIDAB" in the last 72 hours.  Invalid input(s): "FREET3" Anemia work up No results for input(s): "VITAMINB12", "FOLATE", "FERRITIN", "TIBC", "IRON", "RETICCTPCT" in the last 72 hours. Urinalysis    Component Value Date/Time   BILIRUBINUR neg 04/08/2021 1204   KETONESUR negative 07/15/2019 1032   PROTEINUR Negative 04/08/2021 1204   UROBILINOGEN 0.2 04/08/2021 1204   NITRITE neg 04/08/2021 1204   LEUKOCYTESUR Negative 04/08/2021 1204   Sepsis Labs Recent Labs  Lab 08/04/22 1122 08/05/22 0535 08/06/22 0515  WBC 4.9 4.4 3.0*   Microbiology No results found for this or any previous visit (from the past 240 hour(s)).   Time coordinating discharge: Over 30 minutes  SIGNED:   Donnamarie Poag [place] DO  [facility] 08/08/2022, 3:35 PM

## 2022-08-08 NOTE — Progress Notes (Signed)
Subjective: He feels well.  He did not need to use the oral suction last evening.  Objective: Vital signs in last 24 hours: Temp:  [98.6 F (37 C)-99.1 F (37.3 C)] 98.6 F (37 C) (08/18 0424) Pulse Rate:  [82-95] 82 (08/18 0424) Resp:  [16-18] 16 (08/18 0424) BP: (143-151)/(97-102) 151/97 (08/18 0424) SpO2:  [99 %] 99 % (08/18 0424) Last BM Date : 08/06/22  Intake/Output from previous day: 08/17 0701 - 08/18 0700 In: 2503 [P.O.:1260; I.V.:1243] Out: -  Intake/Output this shift: No intake/output data recorded.  General appearance: alert and no distress GI: soft, non-tender; bowel sounds normal; no masses,  no organomegaly  Lab Results: Recent Labs    08/06/22 0515  WBC 3.0*  HGB 8.8*  HCT 28.6*  PLT 179   BMET Recent Labs    08/06/22 0515 08/07/22 0446 08/08/22 0451  NA 148* 145 145  K 3.3* 3.1* 3.1*  CL 118* 114* 115*  CO2 '23 25 23  '$ GLUCOSE 98 100* 108*  BUN '19 15 13  '$ CREATININE 1.31* 1.27* 1.36*  CALCIUM 8.9 9.0 9.0   LFT Recent Labs    08/06/22 0515  PROT 6.8  ALBUMIN 3.3*  AST 13*  ALT 12  ALKPHOS 582*  BILITOT 0.4   PT/INR No results for input(s): "LABPROT", "INR" in the last 72 hours. Hepatitis Panel No results for input(s): "HEPBSAG", "HCVAB", "HEPAIGM", "HEPBIGM" in the last 72 hours. C-Diff No results for input(s): "CDIFFTOX" in the last 72 hours. Fecal Lactopherrin No results for input(s): "FECLLACTOFRN" in the last 72 hours.  Studies/Results: No results found.  Medications: Scheduled:  enzalutamide  160 mg Oral Daily   feeding supplement  1 Container Oral TID BM   hydrochlorothiazide  12.5 mg Oral Daily   lisinopril  20 mg Oral Daily   pantoprazole (PROTONIX) IV  40 mg Intravenous Q12H   potassium chloride  40 mEq Oral Q3H   sucralfate  1 g Oral TID WC & HS   Continuous:  sodium chloride Stopped (08/06/22 2222)   lactated ringers 75 mL/hr at 08/07/22 1953   magnesium sulfate bolus IVPB      Assessment/Plan: 1)  Radiation esophagitis. 2) Metastatic prostate cancer.   He continues to progress.  He feels well, but he is still nervous about his PO intake.  Plan: 1) Full liquid diet. 2) If he is tolerating a full liquid diet, reasonably, he can be discharged home. 3) Follow up in the office in 2 weeks. 4) Signing off.  LOS: 3 days   Makesha Belitz D 08/08/2022, 8:05 AM

## 2022-08-19 ENCOUNTER — Other Ambulatory Visit: Payer: Self-pay

## 2022-08-19 ENCOUNTER — Inpatient Hospital Stay: Payer: Medicaid Other | Attending: Oncology

## 2022-08-19 ENCOUNTER — Inpatient Hospital Stay (HOSPITAL_BASED_OUTPATIENT_CLINIC_OR_DEPARTMENT_OTHER): Payer: Medicaid Other | Admitting: Oncology

## 2022-08-19 VITALS — BP 124/76 | HR 92 | Temp 98.0°F | Resp 17 | Ht 71.0 in | Wt 252.5 lb

## 2022-08-19 DIAGNOSIS — Z79899 Other long term (current) drug therapy: Secondary | ICD-10-CM | POA: Insufficient documentation

## 2022-08-19 DIAGNOSIS — R590 Localized enlarged lymph nodes: Secondary | ICD-10-CM | POA: Insufficient documentation

## 2022-08-19 DIAGNOSIS — C61 Malignant neoplasm of prostate: Secondary | ICD-10-CM | POA: Diagnosis not present

## 2022-08-19 LAB — CBC WITH DIFFERENTIAL (CANCER CENTER ONLY)
Abs Immature Granulocytes: 0.01 10*3/uL (ref 0.00–0.07)
Basophils Absolute: 0 10*3/uL (ref 0.0–0.1)
Basophils Relative: 1 %
Eosinophils Absolute: 0.2 10*3/uL (ref 0.0–0.5)
Eosinophils Relative: 8 %
HCT: 30.4 % — ABNORMAL LOW (ref 39.0–52.0)
Hemoglobin: 10 g/dL — ABNORMAL LOW (ref 13.0–17.0)
Immature Granulocytes: 0 %
Lymphocytes Relative: 16 %
Lymphs Abs: 0.5 10*3/uL — ABNORMAL LOW (ref 0.7–4.0)
MCH: 26.8 pg (ref 26.0–34.0)
MCHC: 32.9 g/dL (ref 30.0–36.0)
MCV: 81.5 fL (ref 80.0–100.0)
Monocytes Absolute: 0.6 10*3/uL (ref 0.1–1.0)
Monocytes Relative: 19 %
Neutro Abs: 1.6 10*3/uL — ABNORMAL LOW (ref 1.7–7.7)
Neutrophils Relative %: 56 %
Platelet Count: 298 10*3/uL (ref 150–400)
RBC: 3.73 MIL/uL — ABNORMAL LOW (ref 4.22–5.81)
RDW: 15.5 % (ref 11.5–15.5)
WBC Count: 2.9 10*3/uL — ABNORMAL LOW (ref 4.0–10.5)
nRBC: 0 % (ref 0.0–0.2)

## 2022-08-19 LAB — CMP (CANCER CENTER ONLY)
ALT: 13 U/L (ref 0–44)
AST: 14 U/L — ABNORMAL LOW (ref 15–41)
Albumin: 4 g/dL (ref 3.5–5.0)
Alkaline Phosphatase: 464 U/L — ABNORMAL HIGH (ref 38–126)
Anion gap: 6 (ref 5–15)
BUN: 13 mg/dL (ref 6–20)
CO2: 27 mmol/L (ref 22–32)
Calcium: 9.5 mg/dL (ref 8.9–10.3)
Chloride: 106 mmol/L (ref 98–111)
Creatinine: 1.17 mg/dL (ref 0.61–1.24)
GFR, Estimated: >60 mL/min (ref >60)
Glucose, Bld: 92 mg/dL (ref 70–99)
Potassium: 3.5 mmol/L (ref 3.5–5.1)
Sodium: 139 mmol/L (ref 135–145)
Total Bilirubin: 0.2 mg/dL — ABNORMAL LOW (ref 0.3–1.2)
Total Protein: 7.3 g/dL (ref 6.5–8.1)

## 2022-08-19 NOTE — Progress Notes (Signed)
Hematology and Oncology Follow Up Visit  Craig Collins [id number] [birth year] 56 y.o. 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  He initially presented in 2020 with Gleason score of 9 and PSA 42.   Prior Therapy: He was started on androgen deprivation therapy initially under the care of Dr. Karsten Ro. His PSA dropped to 2.77 in November 2021.  PSA started to rise and February 2022 up to 5.87.  In November 2022 his PSA was 99.3 and testosterone level of 19.3.    He has failed to follow-up periodically and to reestablish care with Dr. Junious Silk. PSA on May 28, 2022 was 22.1 and PSMA PET scan obtained on June 10, 2022 showed intense uptake within the prostate gland and extension into the seminal vesicle.  Bulky metastatic disease in the pelvis, periaortic and retroperitoneal lymphadenopathy.  Palliative radiation therapy to the left chest wall and left supraclavicular nodal region completed 2023.  He completed 30 Gray in 10 fractions.  Current therapy: Xtandi 160 mg daily started on July 15, 2022.  Interim History: [name] returns today for a follow-up visit.  Since the last visit, he completed radiation therapy to the chest wall and supraclavicular lymph nodes and developed dysphagia and radiation esophagitis.  He was discharged on August 18.  Since his discharge, he reports feeling well and has reasonably recovered.  He is able to swallow soft diet without any difficulties.  He has tolerated Xtandi without any issues.  He denies any nausea, vomiting or edema.  He denies any excessive fatigue.     Medications: I have reviewed the patient's current medications.  Current Outpatient Medications  Medication Sig Dispense Refill   aspirin EC 81 MG tablet Take 81 mg by mouth daily.     Calcium Carbonate-Vitamin D (CALCIUM 500/D) 500-125 MG-UNIT TABS Take 1  tablet by mouth daily.     cyclobenzaprine (FLEXERIL) 10 MG tablet Take 1 tablet (10 mg total) by mouth 3 (three) times daily as needed for muscle spasms. 60 tablet 3   enzalutamide (XTANDI) 40 MG capsule Take 4 capsules (160 mg total) by mouth daily. 120 capsule 1   hydrochlorothiazide (HYDRODIURIL) 25 MG tablet TAKE 1/2 TABLET (12.5 MG TOTAL) BY MOUTH DAILY. 45 tablet 3   ibuprofen (ADVIL) 800 MG tablet TAKE 1 TABLET (800 MG TOTAL) BY MOUTH EVERY 8 (EIGHT) HOURS AS NEEDED. (Patient taking differently: Take 800 mg by mouth every 8 (eight) hours as needed for moderate pain.) 60 tablet 3   lidocaine (XYLOCAINE) 2 % solution Rinse gargle as directed 15 mLs in the mouth or throat every 4 (four) hours as needed (esophagitis discomfort). 100 mL 1   lisinopril (ZESTRIL) 20 MG tablet TAKE 1 TABLET (20 MG TOTAL) BY MOUTH DAILY. 90 tablet 3   LORazepam (ATIVAN) 0.5 MG tablet Take 1 tablet by mouth 30 minutes prior to radiation for claustrophobia. 10 tablet 0   nitroGLYCERIN (NITROSTAT) 0.4 MG SL tablet Place 1 tablet under the tongue every 5 minutes as needed for chest pain (pain with swallowing food).  **NOT USING FOR CARDIAC CHEST PAIN** 25 tablet 0   pantoprazole (PROTONIX) 40 MG tablet Take 1 tablet (40 mg total) by mouth daily. 30 tablet 2   sucralfate (CARAFATE) 1 GM/10ML suspension Take 10 mLs (1 g total) by mouth 4 (four) times daily -  with meals and at bedtime. 1200 mL 2  tamsulosin (FLOMAX) 0.4 MG CAPS capsule TAKE 1 CAPSULE (0.4 MG TOTAL) BY MOUTH DAILY. 30 capsule 11   vitamin B-12 1000 MCG tablet Take 1 tablet (1,000 mcg total) by mouth daily. 30 tablet 0   No current facility-administered medications for this visit.     Allergies: No Known Allergies    Physical Exam: Blood pressure 124/76, pulse 92, temperature 98 F (36.7 C), temperature source Temporal, resp. rate 17, height '5\' 11"'$  (1.803 m), weight 252 lb 8 oz (114.5 kg), SpO2 98 %.  ECOG: 1    General appearance: Comfortable  appearing without any discomfort Head: Normocephalic without any trauma Oropharynx: Mucous membranes are moist and pink without any thrush or ulcers. Eyes: Pupils are equal and round reactive to light. Lymph nodes: No cervical, supraclavicular, inguinal or axillary lymphadenopathy.   Heart:regular rate and rhythm.  S1 and S2 without leg edema. Lung: Clear without any rhonchi or wheezes.  No dullness to percussion. Abdomin: Soft, nontender, nondistended with good bowel sounds.  No hepatosplenomegaly. Musculoskeletal: No joint deformity or effusion.  Full range of motion noted. Neurological: No deficits noted on motor, sensory and deep tendon reflex exam. Skin: No petechial rash or dryness.  Appeared moist.      Lab Results: Lab Results  Component Value Date   WBC 3.0 (L) 08/06/2022   HGB 8.8 (L) 08/06/2022   HCT 28.6 (L) 08/06/2022   MCV 85.4 08/06/2022   PLT 179 08/06/2022   PSA 17.0 (H) 10/28/2017     Chemistry      Component Value Date/Time   NA 145 08/08/2022 0451   NA 141 02/07/2022 1135   K 3.1 (L) 08/08/2022 0451   CL 115 (H) 08/08/2022 0451   CO2 23 08/08/2022 0451   BUN 13 08/08/2022 0451   BUN 15 02/07/2022 1135   CREATININE 1.36 (H) 08/08/2022 0451   CREATININE 1.39 (H) 10/28/2017 0958      Component Value Date/Time   CALCIUM 9.0 08/08/2022 0451   ALKPHOS 582 (H) 08/06/2022 0515   AST 13 (L) 08/06/2022 0515   ALT 12 08/06/2022 0515   BILITOT 0.4 08/06/2022 0515   BILITOT 0.3 02/07/2022 1135          Impression and Plan:   56 year old with:  1.  Advanced prostate cancer with disease to the bone and lymphadenopathy confirmed and 2023 with castration-resistant.  The natural course of this disease was reviewed at this time and treatment choices were discussed.  He is currently on Xtandi which was started in the last 4 weeks.  Alternative treatment options including Taxotere chemotherapy as a PARP inhibitor were reiterated.  Complication associated with  Xtandi including hypertension, fatigue and edema.  He is agreeable to continue and will monitor his PSA.   2.  Supraclavicular adenopathy and bone pain: He is status post radiation therapy with excellent response and improvement in his symptoms.  3.  Bone directed therapy: I recommended obtaining dental clearance before consideration for Xgeva.  He is currently on calcium and vitamin D supplements.   4.  Prognosis and goals of care: His disease is incurable although aggressive measures are warranted at this time.  5.  Androgen deprivation therapy: I recommended continuing this indefinitely.  He is receiving that under the care of Dr. Junious Silk.     6.  Follow-up: In 2 months for a follow-up.     30  minutes were spent on this encounter.  Time was dedicated to reviewing laboratory data, disease status update and  outlining future plan of care discussion.    Zola Button, MD 8/29/20231:38 PM

## 2022-08-20 ENCOUNTER — Telehealth: Payer: Self-pay | Admitting: *Deleted

## 2022-08-20 LAB — PROSTATE-SPECIFIC AG, SERUM (LABCORP): Prostate Specific Ag, Serum: 0.9 ng/mL (ref 0.0–4.0)

## 2022-08-20 NOTE — Telephone Encounter (Signed)
-----   Message from [name], MD sent at 08/20/2022  8:49 AM EDT ----- Please let him know his PSA is down

## 2022-08-20 NOTE — Telephone Encounter (Signed)
PC to patient, informed him his PSA is down to 0.9, he verbalizes understanding.

## 2022-08-26 ENCOUNTER — Telehealth: Payer: Self-pay | Admitting: Oncology

## 2022-08-26 NOTE — Telephone Encounter (Signed)
Scheduled per 08/29 los, patient has been called and notified.  

## 2022-08-27 ENCOUNTER — Other Ambulatory Visit: Payer: Self-pay | Admitting: Oncology

## 2022-08-27 ENCOUNTER — Other Ambulatory Visit: Payer: Self-pay

## 2022-08-27 ENCOUNTER — Other Ambulatory Visit: Payer: Self-pay | Admitting: Nurse Practitioner

## 2022-08-27 DIAGNOSIS — I10 Essential (primary) hypertension: Secondary | ICD-10-CM

## 2022-08-27 MED ORDER — LISINOPRIL 20 MG PO TABS
ORAL_TABLET | Freq: Every day | ORAL | 3 refills | Status: DC
  Filled 2022-08-27: qty 90, 90d supply, fill #0
  Filled 2022-11-26: qty 90, 90d supply, fill #1
  Filled 2023-03-08: qty 90, 90d supply, fill #2
  Filled 2023-06-16: qty 90, 90d supply, fill #3

## 2022-08-28 ENCOUNTER — Other Ambulatory Visit (HOSPITAL_COMMUNITY): Payer: Self-pay

## 2022-08-29 ENCOUNTER — Other Ambulatory Visit: Payer: Self-pay

## 2022-09-02 ENCOUNTER — Other Ambulatory Visit (HOSPITAL_COMMUNITY): Payer: Self-pay

## 2022-09-04 ENCOUNTER — Encounter: Payer: Self-pay | Admitting: Nurse Practitioner

## 2022-09-04 ENCOUNTER — Other Ambulatory Visit: Payer: Self-pay

## 2022-09-04 ENCOUNTER — Ambulatory Visit (INDEPENDENT_AMBULATORY_CARE_PROVIDER_SITE_OTHER): Payer: Self-pay | Admitting: Nurse Practitioner

## 2022-09-04 VITALS — BP 130/93 | HR 90 | Temp 97.2°F | Wt 253.6 lb

## 2022-09-04 DIAGNOSIS — I10 Essential (primary) hypertension: Secondary | ICD-10-CM

## 2022-09-04 DIAGNOSIS — M7051 Other bursitis of knee, right knee: Secondary | ICD-10-CM

## 2022-09-04 MED ORDER — MELOXICAM 7.5 MG PO TABS
7.5000 mg | ORAL_TABLET | Freq: Every day | ORAL | 0 refills | Status: AC
  Filled 2022-09-04: qty 7, 7d supply, fill #0

## 2022-09-04 NOTE — Patient Instructions (Signed)
1. Hypertension, essential  - CBC - Comprehensive metabolic panel   2. Bursitis of right knee, unspecified bursa  - meloxicam (MOBIC) 7.5 MG tablet; Take 1 tablet (7.5 mg total) by mouth daily for 7 days.  Dispense: 7 tablet; Refill: 0    Follow up:  Follow up in 6 months     Bursitis  Bursitis is when the fluid-filled sac (bursa) that covers and protects a joint is swollen (inflamed). Bursitis is most common near joints such as the knees, elbows, hips, and shoulders. It can cause pain and stiffness. What are the causes? An injury to a joint area. Repeated use of a joint. Infection. Certain conditions that cause swelling. What increases the risk? Putting stress on a joint over and over again. Having a condition that weakens your body's defense system (immune system). Doing any of these often: Lifting and reaching overhead. Kneeling or leaning on hard surfaces. Doing activities that have a motion that you do over and over again. This includes running and walking. What are the signs or symptoms? Common symptoms of this condition include: Pain that gets worse when you move the affected body part or use it to support your body weight. Irritation and swelling (inflammation). Stiffness. Other symptoms include: Redness. Swelling. Tenderness. Warmth. Pain that stays after rest. Fever or chills if there is an infection. How is this treated? This condition can often be treated at home with: Rest. Ice. Wrapping the area with an elastic bandage (compression). Keeping the affected area raised (elevation). Other treatments may include: Medicine for pain and swelling. Shots of medicine to the area to lessen swelling. Draining fluid out of the bursa. Antibiotic medicine for infection. Using a splint, brace, wrap, pads, or walking aid. Therapy if pain continues or you have limited movement. Surgery. Follow these instructions at home: Medicines Take over-the-counter and  prescription medicines only as told by your doctor. If you were prescribed an antibiotic medicine, take it as told by your doctor. Do not stop taking it even if you start to feel better. Managing pain, stiffness, and swelling     Raise the injured area above the level of your heart while you are sitting or lying down. If told, put ice on the affected area. To do this: Put ice in a plastic bag. Place a towel between your skin and the bag, or between your splint or brace and the bag. Leave the ice on for 20 minutes, 2-3 times a day. Take off the ice if your skin turns bright red. This is very important. If you cannot feel pain, heat, or cold, you have a greater risk of damage to the area. If told, put heat on the affected area. Do this as often as told by your doctor. Use the heat source that your doctor recommends, such as a moist heat pack or a heating pad. Place a towel between your skin and the heat source. Leave the heat on for 20-30 minutes. Take off the heat if your skin turns bright red. This is very important. If you cannot feel pain, heat, or cold, you have a greater risk of getting burned. General instructions Rest the affected area as told by your doctor. Avoid doing things that make the pain worse. Use a splint, brace, pad, wrap, or walking aid as told by your doctor. Keep all follow-up visits. Preventing symptoms Wear knee pads if you kneel often. Wear running or walking shoes that fit you well. Take a lot of breaks during activities that  involve doing the same movements again and again. Before you do any activity that takes a lot of effort, get your body ready by stretching. Stay at a healthy weight or lose weight if your doctor says you should. If you need help doing this, ask your doctor. Exercise often. If you start any new physical activity, do it slowly. Work with your physical or occupational therapist and doctor to find what caused the bursitis. Contact a doctor  if: You have a fever or chills. You have symptoms that do not get better with treatment. You have pain or swelling that: Gets worse. Goes away and then comes back. You have pus coming from the affected area. You have redness around the affected area. The affected area is warm to the touch. Summary Bursitis is when the fluid-filled sac (bursa) that covers and protects a joint is swollen. Rest the affected area as told by your doctor. Avoid doing things that make the pain worse. Put ice on the affected area as told by your doctor. This information is not intended to replace advice given to you by your health care provider. Make sure you discuss any questions you have with your health care provider. Document Revised: 12/03/2021 Document Reviewed: 12/03/2021 Elsevier Patient Education  [place].

## 2022-09-04 NOTE — Assessment & Plan Note (Signed)
-   CBC - Comprehensive metabolic panel   2. Bursitis of right knee, unspecified bursa  - meloxicam (MOBIC) 7.5 MG tablet; Take 1 tablet (7.5 mg total) by mouth daily for 7 days.  Dispense: 7 tablet; Refill: 0    Follow up:  Follow up in 6 months

## 2022-09-04 NOTE — Progress Notes (Signed)
$'@Patient'f$  ID: Craig Collins, male    DOB: 1966-10-22, 56 y.o.   MRN: [id number]  Chief Complaint  Patient presents with   Knee Pain    Pt states has RT knee pain for a week. Pt is requesting some thing for pain    Referring provider: Vevelyn Francois, NP  HPI  Craig Collins presents for follow up.He . has a past medical history of Benign prostate hyperplasia, Elevated PSA, Erectile dysfunction, Hypertension, Obesity (BMI 35.0-39.9 without comorbidity), Prostate cancer ([place]) (05/2019), Sleep apnea, and Urinary retention.    [name] is in today for follow up for Hypertension. The current prescribed treatment is lisinopril 20 mg daily and HCTZ 25 mg 1/2 tab daily. Compliance is reported. He has been out of the HCTZ for one week despite having additional refills. The  DASH diet is being followed. An exercise regimen not ongoing. There is a goal to continue to work on his weight and be healthy as possible. Denies headache, dizziness, visual changes, shortness of breath, dyspnea on exertion, chest pain, nausea, vomiting or any edema. He reports a strong family history of heart disease. His mother and 2 brothers died from heart attacks at a young age; 7.   Right knee pain. He states that this is intermittent. Has been exercising more. Has been taking ibuprofen. OTC. Has helped some. Will order short course of mobic.      No Known Allergies  Immunization History  Administered Date(s) Administered   Influenza,inj,Quad PF,6+ Mos 10/08/2020   PFIZER(Purple Top)SARS-COV-2 Vaccination 03/06/2020, 04/19/2020, 11/13/2020    Past Medical History:  Diagnosis Date   Benign prostate hyperplasia    Elevated PSA    Erectile dysfunction    Hypertension    Obesity (BMI 35.0-39.9 without comorbidity)    Prostate cancer ([place]) 05/2019   Sleep apnea    Urinary retention     Tobacco History: Social History   Tobacco Use  Smoking Status Never  Smokeless Tobacco Never   Counseling given: Not  Answered   Outpatient Encounter Medications as of 09/04/2022  Medication Sig   aspirin EC 81 MG tablet Take 81 mg by mouth daily.   Calcium Carbonate-Vitamin D (CALCIUM 500/D) 500-125 MG-UNIT TABS Take 1 tablet by mouth daily.   enzalutamide (XTANDI) 40 MG capsule Take 4 capsules (160 mg total) by mouth daily.   hydrochlorothiazide (HYDRODIURIL) 25 MG tablet TAKE 1/2 TABLET (12.5 MG TOTAL) BY MOUTH DAILY.   ibuprofen (ADVIL) 800 MG tablet TAKE 1 TABLET (800 MG TOTAL) BY MOUTH EVERY 8 (EIGHT) HOURS AS NEEDED. (Patient taking differently: Take 800 mg by mouth every 8 (eight) hours as needed for moderate pain.)   lisinopril (ZESTRIL) 20 MG tablet TAKE 1 TABLET (20 MG TOTAL) BY MOUTH DAILY.   meloxicam (MOBIC) 7.5 MG tablet Take 1 tablet (7.5 mg total) by mouth daily for 7 days.   pantoprazole (PROTONIX) 40 MG tablet Take 1 tablet (40 mg total) by mouth daily.   tamsulosin (FLOMAX) 0.4 MG CAPS capsule TAKE 1 CAPSULE (0.4 MG TOTAL) BY MOUTH DAILY.   vitamin B-12 1000 MCG tablet Take 1 tablet (1,000 mcg total) by mouth daily.   XTANDI 40 MG tablet Take 4 tablets ('160mg'$ ) by mouth once daily as directed by physician.   cyclobenzaprine (FLEXERIL) 10 MG tablet Take 1 tablet (10 mg total) by mouth 3 (three) times daily as needed for muscle spasms. (Patient not taking: Reported on 09/04/2022)   lidocaine (XYLOCAINE) 2 % solution Rinse gargle as directed  15 mLs in the mouth or throat every 4 (four) hours as needed (esophagitis discomfort). (Patient not taking: Reported on 09/04/2022)   LORazepam (ATIVAN) 0.5 MG tablet Take 1 tablet by mouth 30 minutes prior to radiation for claustrophobia. (Patient not taking: Reported on 09/04/2022)   nitroGLYCERIN (NITROSTAT) 0.4 MG SL tablet Place 1 tablet under the tongue every 5 minutes as needed for chest pain (pain with swallowing food).  **NOT USING FOR CARDIAC CHEST PAIN** (Patient not taking: Reported on 09/04/2022)   sucralfate (CARAFATE) 1 GM/10ML suspension Take 10  mLs (1 g total) by mouth 4 (four) times daily -  with meals and at bedtime. (Patient not taking: Reported on 09/04/2022)   No facility-administered encounter medications on file as of 09/04/2022.     Review of Systems  Review of Systems  Constitutional: Negative.   HENT: Negative.    Cardiovascular: Negative.   Gastrointestinal: Negative.   Musculoskeletal:        Right knee pain  Allergic/Immunologic: Negative.   Neurological: Negative.   Psychiatric/Behavioral: Negative.         Physical Exam  BP (!) 130/93 (BP Location: Right Arm, Patient Position: Sitting, Cuff Size: Large)   Pulse 90   Temp (!) 97.2 F (36.2 C)   Wt 253 lb 9.6 oz (115 kg)   SpO2 100%   BMI 35.37 kg/m   Wt Readings from Last 5 Encounters:  09/04/22 253 lb 9.6 oz (115 kg)  08/19/22 252 lb 8 oz (114.5 kg)  08/04/22 259 lb 14.8 oz (117.9 kg)  07/10/22 265 lb 12.8 oz (120.6 kg)  07/08/22 265 lb 12.8 oz (120.6 kg)     Physical Exam Vitals and nursing note reviewed.  Constitutional:      General: He is not in acute distress.    Appearance: He is well-developed.  Cardiovascular:     Rate and Rhythm: Normal rate and regular rhythm.  Pulmonary:     Effort: Pulmonary effort is normal.     Breath sounds: Normal breath sounds.  Musculoskeletal:     Right knee: Decreased range of motion. Tenderness present.     Left knee: Normal.  Skin:    General: Skin is warm and dry.  Neurological:     Mental Status: He is alert and oriented to person, place, and time.      Lab Results:  CBC    Component Value Date/Time   WBC 2.9 (L) 08/19/2022 1338   WBC 3.0 (L) 08/06/2022 0515   RBC 3.73 (L) 08/19/2022 1338   HGB 10.0 (L) 08/19/2022 1338   HGB 12.5 (L) 06/18/2020 0921   HCT 30.4 (L) 08/19/2022 1338   HCT 36.7 (L) 06/18/2020 0921   PLT 298 08/19/2022 1338   PLT 242 06/18/2020 0921   MCV 81.5 08/19/2022 1338   MCV 83 06/18/2020 0921   MCH 26.8 08/19/2022 1338   MCHC 32.9 08/19/2022 1338   RDW  15.5 08/19/2022 1338   RDW 13.1 06/18/2020 0921   LYMPHSABS 0.5 (L) 08/19/2022 1338   LYMPHSABS 2.0 06/18/2020 0921   MONOABS 0.6 08/19/2022 1338   EOSABS 0.2 08/19/2022 1338   EOSABS 0.2 06/18/2020 0921   BASOSABS 0.0 08/19/2022 1338   BASOSABS 0.0 06/18/2020 0921    BMET    Component Value Date/Time   NA 139 08/19/2022 1338   NA 141 02/07/2022 1135   K 3.5 08/19/2022 1338   CL 106 08/19/2022 1338   CO2 27 08/19/2022 1338   GLUCOSE 92 08/19/2022 1338  BUN 13 08/19/2022 1338   BUN 15 02/07/2022 1135   CREATININE 1.17 08/19/2022 1338   CREATININE 1.39 (H) 10/28/2017 0958   CALCIUM 9.5 08/19/2022 1338   GFRNONAA >60 08/19/2022 1338   GFRAA 65 06/18/2020 0921    BNP No results found for: "BNP"  ProBNP No results found for: "PROBNP"  Imaging: No results found.   Assessment & Plan:   Hypertension, essential - CBC - Comprehensive metabolic panel   2. Bursitis of right knee, unspecified bursa  - meloxicam (MOBIC) 7.5 MG tablet; Take 1 tablet (7.5 mg total) by mouth daily for 7 days.  Dispense: 7 tablet; Refill: 0    Follow up:  Follow up in 6 months   Patient Instructions  1. Hypertension, essential  - CBC - Comprehensive metabolic panel   2. Bursitis of right knee, unspecified bursa  - meloxicam (MOBIC) 7.5 MG tablet; Take 1 tablet (7.5 mg total) by mouth daily for 7 days.  Dispense: 7 tablet; Refill: 0    Follow up:  Follow up in 6 months     Bursitis  Bursitis is when the fluid-filled sac (bursa) that covers and protects a joint is swollen (inflamed). Bursitis is most common near joints such as the knees, elbows, hips, and shoulders. It can cause pain and stiffness. What are the causes? An injury to a joint area. Repeated use of a joint. Infection. Certain conditions that cause swelling. What increases the risk? Putting stress on a joint over and over again. Having a condition that weakens your body's defense system (immune  system). Doing any of these often: Lifting and reaching overhead. Kneeling or leaning on hard surfaces. Doing activities that have a motion that you do over and over again. This includes running and walking. What are the signs or symptoms? Common symptoms of this condition include: Pain that gets worse when you move the affected body part or use it to support your body weight. Irritation and swelling (inflammation). Stiffness. Other symptoms include: Redness. Swelling. Tenderness. Warmth. Pain that stays after rest. Fever or chills if there is an infection. How is this treated? This condition can often be treated at home with: Rest. Ice. Wrapping the area with an elastic bandage (compression). Keeping the affected area raised (elevation). Other treatments may include: Medicine for pain and swelling. Shots of medicine to the area to lessen swelling. Draining fluid out of the bursa. Antibiotic medicine for infection. Using a splint, brace, wrap, pads, or walking aid. Therapy if pain continues or you have limited movement. Surgery. Follow these instructions at home: Medicines Take over-the-counter and prescription medicines only as told by your doctor. If you were prescribed an antibiotic medicine, take it as told by your doctor. Do not stop taking it even if you start to feel better. Managing pain, stiffness, and swelling     Raise the injured area above the level of your heart while you are sitting or lying down. If told, put ice on the affected area. To do this: Put ice in a plastic bag. Place a towel between your skin and the bag, or between your splint or brace and the bag. Leave the ice on for 20 minutes, 2-3 times a day. Take off the ice if your skin turns bright red. This is very important. If you cannot feel pain, heat, or cold, you have a greater risk of damage to the area. If told, put heat on the affected area. Do this as often as told by your doctor. Use  the  heat source that your doctor recommends, such as a moist heat pack or a heating pad. Place a towel between your skin and the heat source. Leave the heat on for 20-30 minutes. Take off the heat if your skin turns bright red. This is very important. If you cannot feel pain, heat, or cold, you have a greater risk of getting burned. General instructions Rest the affected area as told by your doctor. Avoid doing things that make the pain worse. Use a splint, brace, pad, wrap, or walking aid as told by your doctor. Keep all follow-up visits. Preventing symptoms Wear knee pads if you kneel often. Wear running or walking shoes that fit you well. Take a lot of breaks during activities that involve doing the same movements again and again. Before you do any activity that takes a lot of effort, get your body ready by stretching. Stay at a healthy weight or lose weight if your doctor says you should. If you need help doing this, ask your doctor. Exercise often. If you start any new physical activity, do it slowly. Work with your physical or occupational therapist and doctor to find what caused the bursitis. Contact a doctor if: You have a fever or chills. You have symptoms that do not get better with treatment. You have pain or swelling that: Gets worse. Goes away and then comes back. You have pus coming from the affected area. You have redness around the affected area. The affected area is warm to the touch. Summary Bursitis is when the fluid-filled sac (bursa) that covers and protects a joint is swollen. Rest the affected area as told by your doctor. Avoid doing things that make the pain worse. Put ice on the affected area as told by your doctor. This information is not intended to replace advice given to you by your health care provider. Make sure you discuss any questions you have with your health care provider. Document Revised: 12/03/2021 Document Reviewed: 12/03/2021 Elsevier Patient  Education  [place], [place] 09/04/2022

## 2022-09-05 LAB — COMPREHENSIVE METABOLIC PANEL
ALT: 11 IU/L (ref 0–44)
AST: 11 IU/L (ref 0–40)
Albumin/Globulin Ratio: 1.2 (ref 1.2–2.2)
Albumin: 3.8 g/dL (ref 3.8–4.9)
Alkaline Phosphatase: 303 IU/L — ABNORMAL HIGH (ref 44–121)
BUN/Creatinine Ratio: 10 (ref 9–20)
BUN: 13 mg/dL (ref 6–24)
Bilirubin Total: 0.2 mg/dL (ref 0.0–1.2)
CO2: 22 mmol/L (ref 20–29)
Calcium: 9.5 mg/dL (ref 8.7–10.2)
Chloride: 102 mmol/L (ref 96–106)
Creatinine, Ser: 1.24 mg/dL (ref 0.76–1.27)
Globulin, Total: 3.3 g/dL (ref 1.5–4.5)
Glucose: 107 mg/dL — ABNORMAL HIGH (ref 70–99)
Potassium: 3.7 mmol/L (ref 3.5–5.2)
Sodium: 139 mmol/L (ref 134–144)
Total Protein: 7.1 g/dL (ref 6.0–8.5)
eGFR: 68 mL/min/{1.73_m2} (ref >59)

## 2022-09-05 LAB — CBC
Hematocrit: 30.4 % — ABNORMAL LOW (ref 37.5–51.0)
Hemoglobin: 10 g/dL — ABNORMAL LOW (ref 13.0–17.7)
MCH: 26.6 pg (ref 26.6–33.0)
MCHC: 32.9 g/dL (ref 31.5–35.7)
MCV: 81 fL (ref 79–97)
Platelets: 289 10*3/uL (ref 150–450)
RBC: 3.76 x10E6/uL — ABNORMAL LOW (ref 4.14–5.80)
RDW: 15.4 % (ref 11.6–15.4)
WBC: 4.3 10*3/uL (ref 3.4–10.8)

## 2022-09-09 DIAGNOSIS — C61 Malignant neoplasm of prostate: Secondary | ICD-10-CM | POA: Diagnosis not present

## 2022-09-11 ENCOUNTER — Inpatient Hospital Stay: Payer: Medicaid Other

## 2022-09-11 ENCOUNTER — Inpatient Hospital Stay: Payer: Medicaid Other | Attending: Oncology | Admitting: Genetic Counselor

## 2022-09-11 ENCOUNTER — Other Ambulatory Visit: Payer: Self-pay

## 2022-09-11 ENCOUNTER — Other Ambulatory Visit: Payer: Self-pay | Admitting: Genetic Counselor

## 2022-09-11 ENCOUNTER — Encounter: Payer: Self-pay | Admitting: Genetic Counselor

## 2022-09-11 DIAGNOSIS — C7951 Secondary malignant neoplasm of bone: Secondary | ICD-10-CM

## 2022-09-11 DIAGNOSIS — C61 Malignant neoplasm of prostate: Secondary | ICD-10-CM | POA: Diagnosis not present

## 2022-09-11 DIAGNOSIS — Z8042 Family history of malignant neoplasm of prostate: Secondary | ICD-10-CM | POA: Diagnosis not present

## 2022-09-11 DIAGNOSIS — Z1379 Encounter for other screening for genetic and chromosomal anomalies: Secondary | ICD-10-CM

## 2022-09-11 LAB — CBC WITH DIFFERENTIAL (CANCER CENTER ONLY)
Abs Immature Granulocytes: 0.01 10*3/uL (ref 0.00–0.07)
Basophils Absolute: 0 10*3/uL (ref 0.0–0.1)
Basophils Relative: 1 %
Eosinophils Absolute: 0.2 10*3/uL (ref 0.0–0.5)
Eosinophils Relative: 5 %
HCT: 30.2 % — ABNORMAL LOW (ref 39.0–52.0)
Hemoglobin: 9.9 g/dL — ABNORMAL LOW (ref 13.0–17.0)
Immature Granulocytes: 0 %
Lymphocytes Relative: 17 %
Lymphs Abs: 0.6 10*3/uL — ABNORMAL LOW (ref 0.7–4.0)
MCH: 26.8 pg (ref 26.0–34.0)
MCHC: 32.8 g/dL (ref 30.0–36.0)
MCV: 81.8 fL (ref 80.0–100.0)
Monocytes Absolute: 0.6 10*3/uL (ref 0.1–1.0)
Monocytes Relative: 17 %
Neutro Abs: 2.2 10*3/uL (ref 1.7–7.7)
Neutrophils Relative %: 60 %
Platelet Count: 323 10*3/uL (ref 150–400)
RBC: 3.69 MIL/uL — ABNORMAL LOW (ref 4.22–5.81)
RDW: 15.5 % (ref 11.5–15.5)
WBC Count: 3.6 10*3/uL — ABNORMAL LOW (ref 4.0–10.5)
nRBC: 0 % (ref 0.0–0.2)

## 2022-09-11 LAB — GENETIC SCREENING ORDER

## 2022-09-11 LAB — CMP (CANCER CENTER ONLY)
ALT: 8 U/L (ref 0–44)
AST: 10 U/L — ABNORMAL LOW (ref 15–41)
Albumin: 4 g/dL (ref 3.5–5.0)
Alkaline Phosphatase: 226 U/L — ABNORMAL HIGH (ref 38–126)
Anion gap: 7 (ref 5–15)
BUN: 17 mg/dL (ref 6–20)
CO2: 27 mmol/L (ref 22–32)
Calcium: 9.6 mg/dL (ref 8.9–10.3)
Chloride: 104 mmol/L (ref 98–111)
Creatinine: 1.17 mg/dL (ref 0.61–1.24)
GFR, Estimated: >60 mL/min (ref >60)
Glucose, Bld: 110 mg/dL — ABNORMAL HIGH (ref 70–99)
Potassium: 3.4 mmol/L — ABNORMAL LOW (ref 3.5–5.1)
Sodium: 138 mmol/L (ref 135–145)
Total Bilirubin: 0.3 mg/dL (ref 0.3–1.2)
Total Protein: 7.5 g/dL (ref 6.5–8.1)

## 2022-09-11 NOTE — Progress Notes (Signed)
REFERRING PROVIDER: Zola Button, MD  PRIMARY PROVIDER:  [name], NP  PRIMARY REASON FOR VISIT:  Encounter Diagnoses  Name Primary?   Malignant neoplasm of prostate metastatic to bone (HCC) Yes   Family history of prostate cancer    HISTORY OF PRESENT ILLNESS:   Craig Collins, a 56 y.o. male, was seen for a [facility] cancer genetics consultation at the request of Dr. Alen Blew due to a personal and family history of cancer.  Craig Collins presents to clinic today to discuss the possibility of a hereditary predisposition to cancer, to discuss genetic testing, and to further clarify his future cancer risks, as well as potential cancer risks for family members.   Craig Collins was first diagnosed with high risk prostate cancer at age 35 (Gleason score of 9). He currently has metastatic prostate cancer.  CANCER HISTORY:  Oncology History  Prostate cancer ([place])  12/21/2019 Initial Diagnosis   Prostate cancer ([place])   07/08/2022 Cancer Staging   Staging form: Prostate, AJCC 8th Edition - Clinical: Stage IVB (cTX, cNX, pM1b, PSA: 42, Grade Group: 5) - Signed by [name], MD on 07/08/2022 Prostate specific antigen (PSA) range: 20 or greater Histologic grading system: 5 grade system     Past Medical History:  Diagnosis Date   Benign prostate hyperplasia    Elevated PSA    Erectile dysfunction    Hypertension    Obesity (BMI 35.0-39.9 without comorbidity)    Prostate cancer ([place]) 05/2019   Sleep apnea    Urinary retention     Past Surgical History:  Procedure Laterality Date   COLONOSCOPY N/A 10/14/2017   Procedure: COLONOSCOPY;  Surgeon: Gatha Mayer, MD;  Location: [facility];  Service: Endoscopy;  Laterality: N/A;   ESOPHAGOGASTRODUODENOSCOPY (EGD) WITH PROPOFOL N/A 08/04/2022   Procedure: ESOPHAGOGASTRODUODENOSCOPY (EGD) WITH PROPOFOL;  Surgeon: Juanita Craver, MD;  Location: [facility];  Service: Gastroenterology;  Laterality: N/A;    Social History    Socioeconomic History   Marital status: Married    Spouse name: Not on file   Number of children: Not on file   Years of education: Not on file   Highest education level: Not on file  Occupational History   Not on file  Tobacco Use   Smoking status: Never   Smokeless tobacco: Never  Vaping Use   Vaping Use: Never used  Substance and Sexual Activity   Alcohol use: Not Currently   Drug use: No   Sexual activity: Yes    Birth control/protection: Condom  Other Topics Concern   Not on file  Social History Narrative   Not on file   Social Determinants of Health   Financial Resource Strain: Not on file  Food Insecurity: Not on file  Transportation Needs: Not on file  Physical Activity: Not on file  Stress: Not on file  Social Connections: Not on file     FAMILY HISTORY:  We obtained a detailed, 4-generation family history.  Significant diagnoses are listed below: Family History  Problem Relation Age of Onset   Leukemia Mother 54 - 1   Hypertension Brother    Prostate cancer Maternal Uncle    Prostate cancer Maternal Grandfather    Hypertension Other    Prostate cancer Cousin        maternal first cousin   Colon cancer Neg Hx      Craig Collins mother was diagnosed with leukemia in her 26s and died due to metastatic cancer in her 11s. His  maternal uncle was diagnosed with prostate cancer at an unknown age. This uncle's son (Craig Collins cousin) was diagnosed with prostate cancer at an unknown age. His maternal grandfather was diagnosed with prostate cancer at an unknown age, he is deceased. Craig Collins reports 2 paternal great uncles (grandmother's siblings) diagnosed with an unknown type of cancer, they are deceased.   Craig Collins is unaware of previous family history of genetic testing for hereditary cancer risks. There is no reported Ashkenazi Jewish ancestry.   GENETIC COUNSELING ASSESSMENT: Craig Collins is a 56 y.o. male with a personal and family history of  cancer which is somewhat suggestive of a hereditary predisposition to cancer given his personal history of metastatic prostate cancer. We, therefore, discussed and recommended the following at today's visit.   DISCUSSION: We discussed that 5 - 10% of cancer is hereditary, with most cases of prostate cancer associated with BRCA1/2.  There are other genes that can be associated with hereditary prostate cancer syndromes.  We discussed that testing is beneficial for several reasons including knowing how to follow individuals after completing their treatment, identifying whether potential treatment options would be beneficial, and understanding if other family members could be at risk for cancer and allowing them to undergo genetic testing.   We reviewed the characteristics, features and inheritance patterns of hereditary cancer syndromes. We also discussed genetic testing, including the appropriate family members to test, the process of testing, insurance coverage and turn-around-time for results. We discussed the implications of a negative, positive, carrier and/or variant of uncertain significant result. We recommended Craig Collins pursue genetic testing for a panel that includes genes associated with prostate cancer.   Craig Collins  was offered a common hereditary cancer panel (47 genes) and an expanded pan-cancer panel (77 genes). Craig Collins was informed of the benefits and limitations of each panel, including that expanded pan-cancer panels contain genes that do not have clear management guidelines at this point in time.  We also discussed that as the number of genes included on a panel increases, the chances of variants of uncertain significance increases. After considering the benefits and limitations of each gene panel, Craig Collins elected to have [facility] CancerNext-Expanded Panel.  The CancerNext-Expanded gene panel offered by [facility] and includes sequencing, rearrangement, and RNA analysis for the  following 77 genes: AIP, ALK, APC, ATM, AXIN2, BAP1, BARD1, BLM, BMPR1A, BRCA1, BRCA2, BRIP1, CDC73, CDH1, CDK4, CDKN1B, CDKN2A, CHEK2, CTNNA1, DICER1, FANCC, FH, FLCN, GALNT12, KIF1B, LZTR1, MAX, MEN1, MET, MLH1, MSH2, MSH3, MSH6, MUTYH, NBN, NF1, NF2, NTHL1, PALB2, PHOX2B, PMS2, POT1, PRKAR1A, PTCH1, PTEN, RAD51C, RAD51D, RB1, RECQL, RET, SDHA, SDHAF2, SDHB, SDHC, SDHD, SMAD4, SMARCA4, SMARCB1, SMARCE1, STK11, SUFU, TMEM127, TP53, TSC1, TSC2, VHL and XRCC2 (sequencing and deletion/duplication); EGFR, EGLN1, HOXB13, KIT, MITF, PDGFRA, POLD1, and POLE (sequencing only); EPCAM and GREM1 (deletion/duplication only).   Based on Mr. Kienast personal and family history of cancer, he meets medical criteria for genetic testing. We will GRATIS the testing so that there is no cost for Mr. Tetrick.   PLAN: After considering the risks, benefits, and limitations, Mr. Loberg provided informed consent to pursue genetic testing and the blood sample was sent to [facility] for analysis of the CancerNext-Expanded Panel. Results should be available within approximately 2-3 weeks' time, at which point they will be disclosed by telephone to Mr. Depuy, as will any additional recommendations warranted by these results. Mr. Chiara will receive a summary of his genetic counseling visit and a copy of his results  once available. This information will also be available in Epic.   Mr. Drew questions were answered to his satisfaction today. Our contact information was provided should additional questions or concerns arise. Thank you for the referral and allowing [place] to share in the care of your patient.   Lucille Passy, MS, [facility] Genetic Counselor [email or web address] (P) [phone]  The patient was seen for a total of 30 minutes in face-to-face genetic counseling.  The patient brought his wife.  Drs. Lindi Adie and/or Burr Medico were available to discuss this case as needed.    _______________________________________________________________________ For Office Staff:  Number of people involved in session: 2 Was an Intern/ student involved with case: no

## 2022-09-12 ENCOUNTER — Encounter: Payer: Self-pay | Admitting: Oncology

## 2022-09-12 LAB — PROSTATE-SPECIFIC AG, SERUM (LABCORP): Prostate Specific Ag, Serum: 0.5 ng/mL (ref 0.0–4.0)

## 2022-09-15 ENCOUNTER — Telehealth: Payer: Self-pay | Admitting: *Deleted

## 2022-09-15 NOTE — Telephone Encounter (Signed)
CALLED PATIENT TO ASK IF IT IS OK FOR THE PROVIDER TO CALL HIM FOR TELEPHONE FU ON 09-17-22 @ 10 AM, LVM FOR A RETURN CALL

## 2022-09-16 ENCOUNTER — Encounter: Payer: Self-pay | Admitting: Oncology

## 2022-09-16 ENCOUNTER — Other Ambulatory Visit (HOSPITAL_COMMUNITY): Payer: Self-pay

## 2022-09-16 ENCOUNTER — Encounter: Payer: Self-pay | Admitting: Urology

## 2022-09-16 NOTE — Progress Notes (Signed)
Telephone appointment. I verified patient's identity and began nursing interview. Patient doing well. No issues reported at this time.  Meaningful use complete. I-PSS score of 1-mild Flomax as directed. Urology appt- Oct 16, 2022  Patient aware of 10:00am-09/17/22 telephone appointment w/ Ashlyn Bruning PA-C. I left my extension [phone] in case patient needs anything. Patient verbalized understanding.  Patient contact [phone]

## 2022-09-17 ENCOUNTER — Ambulatory Visit
Admission: RE | Admit: 2022-09-17 | Discharge: 2022-09-17 | Disposition: A | Discharge status: Home / Self Care | Payer: Medicaid Other | Source: Ambulatory Visit | Attending: Radiation Oncology | Admitting: Radiation Oncology

## 2022-09-17 DIAGNOSIS — C61 Malignant neoplasm of prostate: Secondary | ICD-10-CM | POA: Insufficient documentation

## 2022-09-17 DIAGNOSIS — C778 Secondary and unspecified malignant neoplasm of lymph nodes of multiple regions: Secondary | ICD-10-CM | POA: Insufficient documentation

## 2022-09-17 DIAGNOSIS — C7951 Secondary malignant neoplasm of bone: Secondary | ICD-10-CM | POA: Insufficient documentation

## 2022-09-17 LAB — GUARDANT 360

## 2022-09-17 NOTE — Progress Notes (Signed)
  Radiation Oncology         (336) [phone] ________________________________  Name: MERLE CIRELLI MRN: [id number]  Date: 07/29/2022  DOB: 31-Mar-1966  End of Treatment Note  Diagnosis:   56 year old male with advanced, castrate sensitive metastatic prostate cancer with painful bony disease in the left chest wall and left supraclavicular nodal region.     Indication for treatment:  Palliation       Radiation treatment dates:   07/16/22 - 07/29/22  Site/dose:   The painful metastatic disease in the left chest wall and left supraclavicular lymph node were treated to 30 Gy in 10 fractions of 3 Gy each.  Beams/energy:   A 3D field set-up was employed with 6 MV X-rays  Narrative: The patient tolerated radiation treatment relatively well.  He did experience some significant acid reflux and dysphagia/esophagitis which was managed with Carafate as needed.  Plan: The patient has completed radiation treatment. The patient will return to radiation oncology clinic for routine followup in one month. I advised him to call or return sooner if he has any questions or concerns related to his recovery or treatment. ________________________________  Sheral Apley. Tammi Klippel, M.D.

## 2022-09-17 NOTE — Progress Notes (Addendum)
Radiation Oncology         (336) [phone] ________________________________  Name: Craig Collins MRN: [id number]  Date: 09/17/2022  DOB: April 11, 1966  Post Treatment Note  CC: [name], NP  [name], MD  Diagnosis:   56 year old male with advanced, castrate sensitive metastatic prostate cancer with painful bony disease in the left chest wall and left supraclavicular nodal region.  Interval Since Last Radiation:  7 weeks   07/16/22 - 07/29/22:  The painful metastatic disease in the left chest wall and left supraclavicular lymph node were treated to 30 Gy in 10 fractions of 3 Gy each  Narrative:  I spoke with the patient to conduct his routine scheduled 1 month follow up visit via telephone to spare the patient unnecessary potential exposure in the healthcare setting during the current COVID-19 pandemic.  The patient was notified in advance and gave permission to proceed with this visit format.  He tolerated radiation treatment relatively well.  He did experience some significant acid reflux and dysphagia/esophagitis which was managed with Carafate as needed.                              On review of systems, the patient states that he is doing well in general.  The dysphagia/esophagitis is significantly improved at this point and the pain that he was having in the left chest wall area has completely resolved which he is quite pleased with.  He denies any skin irritation or significant fatigue.  ALLERGIES:  has No Known Allergies.  Meds: Current Outpatient Medications  Medication Sig Dispense Refill   aspirin EC 81 MG tablet Take 81 mg by mouth daily.     Calcium Carbonate-Vitamin D (CALCIUM 500/D) 500-125 MG-UNIT TABS Take 1 tablet by mouth daily.     cyclobenzaprine (FLEXERIL) 10 MG tablet Take 1 tablet (10 mg total) by mouth 3 (three) times daily as needed for muscle spasms. (Patient not taking: Reported on 09/04/2022) 60 tablet 3   enzalutamide (XTANDI) 40 MG capsule Take 4  capsules (160 mg total) by mouth daily. 120 capsule 1   hydrochlorothiazide (HYDRODIURIL) 25 MG tablet TAKE 1/2 TABLET (12.5 MG TOTAL) BY MOUTH DAILY. 45 tablet 3   ibuprofen (ADVIL) 800 MG tablet TAKE 1 TABLET (800 MG TOTAL) BY MOUTH EVERY 8 (EIGHT) HOURS AS NEEDED. (Patient taking differently: Take 800 mg by mouth every 8 (eight) hours as needed for moderate pain.) 60 tablet 3   lidocaine (XYLOCAINE) 2 % solution Rinse gargle as directed 15 mLs in the mouth or throat every 4 (four) hours as needed (esophagitis discomfort). (Patient not taking: Reported on 09/04/2022) 100 mL 1   lisinopril (ZESTRIL) 20 MG tablet TAKE 1 TABLET (20 MG TOTAL) BY MOUTH DAILY. 90 tablet 3   LORazepam (ATIVAN) 0.5 MG tablet Take 1 tablet by mouth 30 minutes prior to radiation for claustrophobia. (Patient not taking: Reported on 09/04/2022) 10 tablet 0   nitroGLYCERIN (NITROSTAT) 0.4 MG SL tablet Place 1 tablet under the tongue every 5 minutes as needed for chest pain (pain with swallowing food).  **NOT USING FOR CARDIAC CHEST PAIN** (Patient not taking: Reported on 09/04/2022) 25 tablet 0   pantoprazole (PROTONIX) 40 MG tablet Take 1 tablet (40 mg total) by mouth daily. 30 tablet 2   sucralfate (CARAFATE) 1 GM/10ML suspension Take 10 mLs (1 g total) by mouth 4 (four) times daily -  with meals and at bedtime. (Patient  not taking: Reported on 09/04/2022) 1200 mL 2   tamsulosin (FLOMAX) 0.4 MG CAPS capsule TAKE 1 CAPSULE (0.4 MG TOTAL) BY MOUTH DAILY. 30 capsule 11   vitamin B-12 1000 MCG tablet Take 1 tablet (1,000 mcg total) by mouth daily. 30 tablet 0   XTANDI 40 MG tablet Take 4 tablets ('160mg'$ ) by mouth once daily as directed by physician. 120 tablet 0   No current facility-administered medications for this encounter.    Physical Findings:  vitals were not taken for this visit.  Pain Assessment Pain Score: 0-No pain/10 Unable to assess due to telephone follow-up visit format.  Lab Findings: Lab Results  Component  Value Date   WBC 3.6 (L) 09/11/2022   HGB 9.9 (L) 09/11/2022   HCT 30.2 (L) 09/11/2022   MCV 81.8 09/11/2022   PLT 323 09/11/2022     Radiographic Findings: No results found.  Impression/Plan: 23. 56 year old male with advanced, castrate sensitive metastatic prostate cancer with painful bony disease in the left chest wall and left supraclavicular nodal region. He appears to be recovering well from the effects of his recent palliative radiotherapy and is currently without complaints.  We discussed that while we are happy to continue to participate in his care if clinically indicated, at this point, we will plan to see him back on an as-needed basis.  He will continue in routine follow-up under the care and direction of Dr. Alen Blew and Dr. Junious Silk for continued management of his systemic disease but knows that he is welcome to call at anytime with any questions or concerns related to his previous radiotherapy.  We enjoyed taking care of him and look forward to continuing to follow his progress via correspondence.    Craig Johns, PA-C

## 2022-09-17 NOTE — Addendum Note (Signed)
Encounter addended by: Freeman Caldron, PA-C on: 09/17/2022 11:01 AM  Actions taken: Clinical Note Signed

## 2022-09-24 ENCOUNTER — Other Ambulatory Visit: Payer: Self-pay | Admitting: Oncology

## 2022-09-25 ENCOUNTER — Inpatient Hospital Stay: Payer: Medicaid Other | Attending: Oncology

## 2022-09-25 DIAGNOSIS — Z79899 Other long term (current) drug therapy: Secondary | ICD-10-CM | POA: Insufficient documentation

## 2022-09-25 DIAGNOSIS — C61 Malignant neoplasm of prostate: Secondary | ICD-10-CM | POA: Insufficient documentation

## 2022-09-25 DIAGNOSIS — R590 Localized enlarged lymph nodes: Secondary | ICD-10-CM | POA: Insufficient documentation

## 2022-09-25 NOTE — Progress Notes (Signed)
[place] CSW Progress Note  Clinical Education officer, museum contacted patient by phone to discuss his disability per the request of the nurse navigator.  Patient said he was denied disability because the application said Stage III and not Stage IV.  Encouraged him to contact the disability office and inform them of the mistake.  CSW offered to write a letter stating he has Stage IV cancer and he agreed.  Patient to pick up letter at check-in at [facility] on Monday, 09/29/22.  Patient has CSW contact information.    Rodman Pickle Shakiah Wester, LCSW

## 2022-09-25 NOTE — Progress Notes (Signed)
Patient called with concerns regarding disability being denied.    Patient is currently under the care of Dr. Alen Blew for his advanced prostate cancer with disease to the bone and lymphadenopathy, Stage IVB.  Current treatment, Xtandi, and ADT under Dr. Junious Silk.   RN spoke with patient and patient verbalized denial was due to staging showing stage 3.  RN explained that per MD staging is documented as Stage IVB, and we may need to resubmit additional information with staging.  Patient verbalized understanding.   RN collaborated with [employer] and they are agreeable to reach out to patient to assist with disability needs.  Pt aware. No further needs at this time. Plan of care in progress.

## 2022-09-26 ENCOUNTER — Telehealth: Payer: Self-pay | Admitting: Genetic Counselor

## 2022-09-26 ENCOUNTER — Encounter: Payer: Self-pay | Admitting: Genetic Counselor

## 2022-09-26 DIAGNOSIS — Z1379 Encounter for other screening for genetic and chromosomal anomalies: Secondary | ICD-10-CM | POA: Insufficient documentation

## 2022-09-26 NOTE — Telephone Encounter (Signed)
I contacted Mr. Stthomas to discuss his genetic testing results. No pathogenic variants were identified in the 77 genes analyzed. Of note, a variant of uncertain significance was identified in the BRIP1 gene. Detailed clinic note to follow.  The test report has been scanned into EPIC and is located under the Molecular Pathology section of the Results Review tab.  A portion of the result report is included below for reference.   Lucille Passy, MS, [facility] Genetic Counselor [email or web address] (P) [phone]

## 2022-10-01 ENCOUNTER — Other Ambulatory Visit: Payer: Self-pay | Admitting: Nurse Practitioner

## 2022-10-01 ENCOUNTER — Ambulatory Visit: Payer: Self-pay | Admitting: Genetic Counselor

## 2022-10-01 DIAGNOSIS — R972 Elevated prostate specific antigen [PSA]: Secondary | ICD-10-CM

## 2022-10-01 DIAGNOSIS — Z1379 Encounter for other screening for genetic and chromosomal anomalies: Secondary | ICD-10-CM

## 2022-10-01 DIAGNOSIS — N400 Enlarged prostate without lower urinary tract symptoms: Secondary | ICD-10-CM

## 2022-10-01 DIAGNOSIS — R339 Retention of urine, unspecified: Secondary | ICD-10-CM

## 2022-10-01 NOTE — Progress Notes (Addendum)
HPI:   Craig Collins was previously seen in the [place] [facility] clinic due to a personal and family history of cancer and concerns regarding a hereditary predisposition to cancer. Please refer to our prior cancer genetics clinic note for more information regarding our discussion, assessment and recommendations, at the time. Craig Collins recent genetic test results were disclosed to him, as were recommendations warranted by these results. These results and recommendations are discussed in more detail below.  CANCER HISTORY:  Oncology History  Prostate cancer ([facility]CC)  12/21/2019 Initial Diagnosis   Prostate cancer (HCC)   07/08/2022 Cancer Staging   Staging form: Prostate, AJCC 8th Edition - Clinical: Stage IVB (cTX, cNX, pM1b, PSA: 42, Grade Group: 5) - Signed by Benjiman Core, MD on 07/08/2022 Prostate specific antigen (PSA) range: 20 or greater Histologic grading system: 5 grade system     FAMILY HISTORY:  We obtained a detailed, 4-generation family history.  Significant diagnoses are listed below:      Family History  Problem Relation Age of Onset   Leukemia Mother 39 - 22   Hypertension Brother     Prostate cancer Maternal Uncle     Prostate cancer Maternal Grandfather     Hypertension Other     Prostate cancer Cousin          maternal first cousin   Colon cancer Neg Hx         Craig Collins mother was diagnosed with leukemia in her 30s and died due to metastatic cancer in her 30s. His maternal uncle was diagnosed with prostate cancer at an unknown age. This uncle's son (Craig Collins cousin) was diagnosed with prostate cancer at an unknown age. His maternal grandfather was diagnosed with prostate cancer at an unknown age, he is deceased. Craig Collins reports 2 paternal great uncles (grandmother's siblings) diagnosed with an unknown type of cancer, they are deceased.    Craig Collins is unaware of previous family history of genetic testing for hereditary cancer risks.  There is no reported Ashkenazi Jewish ancestry.   GENETIC TEST RESULTS:  The [facility] CancerNext-Expanded Panel found no pathogenic mutations.  The CancerNext-Expanded gene panel offered by [facility] and includes sequencing, rearrangement, and RNA analysis for the following 77 genes: AIP, ALK, APC, ATM, AXIN2, BAP1, BARD1, BLM, BMPR1A, BRCA1, BRCA2, BRIP1, CDC73, CDH1, CDK4, CDKN1B, CDKN2A, CHEK2, CTNNA1, DICER1, FANCC, FH, FLCN, GALNT12, KIF1B, LZTR1, MAX, MEN1, MET, MLH1, MSH2, MSH3, MSH6, MUTYH, NBN, NF1, NF2, NTHL1, PALB2, PHOX2B, PMS2, POT1, PRKAR1A, PTCH1, PTEN, RAD51C, RAD51D, RB1, RECQL, RET, SDHA, SDHAF2, SDHB, SDHC, SDHD, SMAD4, SMARCA4, SMARCB1, SMARCE1, STK11, SUFU, TMEM127, TP53, TSC1, TSC2, VHL and XRCC2 (sequencing and deletion/duplication); EGFR, EGLN1, HOXB13, KIT, MITF, PDGFRA, POLD1, and POLE (sequencing only); EPCAM and GREM1 (deletion/duplication only).   The test report has been scanned into EPIC and is located under the Molecular Pathology section of the Results Review tab.  A portion of the result report is included below for reference. Genetic testing reported out on 09/22/2022.       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  At this time, it is unknown if this variant is associated with an increased risk for cancer or if it is benign, but most uncertain variants are reclassified to benign. It should not be used to make medical management decisions. With time, we suspect the laboratory will determine the significance of this variant, if any. If the laboratory reclassifies this variant, we will  attempt to contact Craig Collins to discuss it further.   Update: The VUS identified in the BRIP1 gene ([id number].E1027K) was reclassified to likely benign. Report date is 08/18/2023.  Even though a pathogenic variant was not identified, possible explanations for the cancer in the family may include: There may be no hereditary risk  for cancer in the family. The cancers in Craig Collins and/or his family may be due to other genetic or environmental factors. There may be a gene mutation in one of these genes that current testing methods cannot detect, but that chance is small. There could be another gene that has not yet been discovered, or that we have not yet tested, that is responsible for the cancer diagnoses in the family.   Therefore, it is important to remain in touch with cancer genetics in the future so that we can continue to offer Craig Collins the most up to date genetic testing.   ADDITIONAL GENETIC TESTING:  We discussed with Craig Collins that his genetic testing was fairly extensive.  If there are genes identified to increase cancer risk that can be analyzed in the future, we would be happy to discuss and coordinate this testing at that time.     CANCER SCREENING RECOMMENDATIONS:  Craig Collins test result is considered negative (normal).  This means that we have not identified a hereditary cause for his personal and family history of cancer at this time.    An individual's cancer risk and medical management are not determined by genetic test results alone. Overall cancer risk assessment incorporates additional factors, including personal medical history, family history, and any available genetic information that may result in a personalized plan for cancer prevention and surveillance. Therefore, it is recommended he continue to follow the cancer management and screening guidelines provided by his oncology and primary healthcare provider.  RECOMMENDATIONS FOR FAMILY MEMBERS:   Since he did not inherit a mutation in a cancer predisposition gene included on this panel, his children could not have inherited a mutation from him in one of these genes. We do not recommend familial testing for the BRIP1 variant of uncertain significance (VUS).  FOLLOW-UP:  Cancer genetics is a rapidly advancing field and it is possible that  new genetic tests will be appropriate for him and/or his family members in the future. We encouraged him to remain in contact with cancer genetics on an annual basis so we can update his personal and family histories and let him know of advances in cancer genetics that may benefit this family.   Our contact number was provided. Craig Collins questions were answered to his satisfaction, and he knows he is welcome to call us at anytime with additional questions or concerns.   Lalla Brothers, MS, [facility] Genetic Counselor [email or web address] (P) [phone]

## 2022-10-02 ENCOUNTER — Other Ambulatory Visit: Payer: Self-pay

## 2022-10-02 MED ORDER — TAMSULOSIN HCL 0.4 MG PO CAPS
ORAL_CAPSULE | Freq: Every day | ORAL | 11 refills | Status: DC
  Filled 2022-10-02: qty 30, 30d supply, fill #0
  Filled 2022-11-05 – 2022-11-14 (×2): qty 30, 30d supply, fill #1
  Filled 2022-12-18: qty 30, 30d supply, fill #2
  Filled 2023-01-23: qty 30, 30d supply, fill #3
  Filled 2023-02-24: qty 30, 30d supply, fill #4
  Filled 2023-03-26: qty 30, 30d supply, fill #5
  Filled 2023-05-03: qty 30, 30d supply, fill #6
  Filled 2023-06-02: qty 30, 30d supply, fill #7
  Filled 2023-07-01: qty 30, 30d supply, fill #8
  Filled 2023-08-06: qty 30, 30d supply, fill #9
  Filled 2023-09-10: qty 30, 30d supply, fill #10

## 2022-10-03 ENCOUNTER — Other Ambulatory Visit: Payer: Self-pay

## 2022-10-06 ENCOUNTER — Other Ambulatory Visit: Payer: Self-pay

## 2022-10-08 ENCOUNTER — Other Ambulatory Visit: Payer: Self-pay

## 2022-10-11 ENCOUNTER — Encounter (HOSPITAL_COMMUNITY): Payer: Self-pay

## 2022-10-11 ENCOUNTER — Other Ambulatory Visit: Payer: Self-pay

## 2022-10-11 ENCOUNTER — Emergency Department (HOSPITAL_COMMUNITY): Payer: Medicaid Other

## 2022-10-11 ENCOUNTER — Emergency Department (HOSPITAL_COMMUNITY)
Admission: EM | Admit: 2022-10-11 | Discharge: 2022-10-11 | Disposition: A | Discharge status: Home / Self Care | Payer: Medicaid Other | Attending: Emergency Medicine | Admitting: Emergency Medicine

## 2022-10-11 ENCOUNTER — Other Ambulatory Visit (HOSPITAL_COMMUNITY): Payer: Self-pay

## 2022-10-11 DIAGNOSIS — Z7982 Long term (current) use of aspirin: Secondary | ICD-10-CM | POA: Diagnosis not present

## 2022-10-11 DIAGNOSIS — J189 Pneumonia, unspecified organism: Secondary | ICD-10-CM

## 2022-10-11 DIAGNOSIS — R200 Anesthesia of skin: Secondary | ICD-10-CM | POA: Diagnosis not present

## 2022-10-11 DIAGNOSIS — R079 Chest pain, unspecified: Secondary | ICD-10-CM | POA: Diagnosis not present

## 2022-10-11 DIAGNOSIS — I10 Essential (primary) hypertension: Secondary | ICD-10-CM | POA: Insufficient documentation

## 2022-10-11 DIAGNOSIS — R42 Dizziness and giddiness: Secondary | ICD-10-CM | POA: Diagnosis not present

## 2022-10-11 DIAGNOSIS — R791 Abnormal coagulation profile: Secondary | ICD-10-CM | POA: Diagnosis not present

## 2022-10-11 DIAGNOSIS — J188 Other pneumonia, unspecified organism: Secondary | ICD-10-CM | POA: Diagnosis not present

## 2022-10-11 DIAGNOSIS — Z79899 Other long term (current) drug therapy: Secondary | ICD-10-CM | POA: Insufficient documentation

## 2022-10-11 DIAGNOSIS — R0789 Other chest pain: Secondary | ICD-10-CM | POA: Diagnosis not present

## 2022-10-11 DIAGNOSIS — J168 Pneumonia due to other specified infectious organisms: Secondary | ICD-10-CM | POA: Diagnosis not present

## 2022-10-11 DIAGNOSIS — R0602 Shortness of breath: Secondary | ICD-10-CM | POA: Diagnosis not present

## 2022-10-11 LAB — HEPATIC FUNCTION PANEL
ALT: 13 U/L (ref 0–44)
AST: 14 U/L — ABNORMAL LOW (ref 15–41)
Albumin: 4 g/dL (ref 3.5–5.0)
Alkaline Phosphatase: 153 U/L — ABNORMAL HIGH (ref 38–126)
Bilirubin, Direct: <0.1 mg/dL (ref 0.0–0.2)
Total Bilirubin: 0.5 mg/dL (ref 0.3–1.2)
Total Protein: 7.9 g/dL (ref 6.5–8.1)

## 2022-10-11 LAB — TROPONIN I (HIGH SENSITIVITY): Troponin I (High Sensitivity): 4 ng/L (ref <18)

## 2022-10-11 LAB — CBC
HCT: 33.8 % — ABNORMAL LOW (ref 39.0–52.0)
Hemoglobin: 10.8 g/dL — ABNORMAL LOW (ref 13.0–17.0)
MCH: 27.6 pg (ref 26.0–34.0)
MCHC: 32 g/dL (ref 30.0–36.0)
MCV: 86.2 fL (ref 80.0–100.0)
Platelets: 293 10*3/uL (ref 150–400)
RBC: 3.92 MIL/uL — ABNORMAL LOW (ref 4.22–5.81)
RDW: 15.8 % — ABNORMAL HIGH (ref 11.5–15.5)
WBC: 3.6 10*3/uL — ABNORMAL LOW (ref 4.0–10.5)
nRBC: 0 % (ref 0.0–0.2)

## 2022-10-11 LAB — BASIC METABOLIC PANEL
Anion gap: 8 (ref 5–15)
BUN: 18 mg/dL (ref 6–20)
CO2: 22 mmol/L (ref 22–32)
Calcium: 9.6 mg/dL (ref 8.9–10.3)
Chloride: 108 mmol/L (ref 98–111)
Creatinine, Ser: 1.26 mg/dL — ABNORMAL HIGH (ref 0.61–1.24)
GFR, Estimated: >60 mL/min (ref >60)
Glucose, Bld: 101 mg/dL — ABNORMAL HIGH (ref 70–99)
Potassium: 3.6 mmol/L (ref 3.5–5.1)
Sodium: 138 mmol/L (ref 135–145)

## 2022-10-11 LAB — D-DIMER, QUANTITATIVE: D-Dimer, Quant: 0.91 ug/mL-FEU — ABNORMAL HIGH (ref 0.00–0.50)

## 2022-10-11 LAB — LIPASE, BLOOD: Lipase: 30 U/L (ref 11–51)

## 2022-10-11 MED ORDER — IOHEXOL 350 MG/ML SOLN
100.0000 mL | Freq: Once | INTRAVENOUS | Status: AC | PRN
  Administered 2022-10-11: 100 mL via INTRAVENOUS

## 2022-10-11 MED ORDER — SODIUM CHLORIDE 0.9 % IV BOLUS
1000.0000 mL | Freq: Once | INTRAVENOUS | Status: AC
  Administered 2022-10-11: 1000 mL via INTRAVENOUS

## 2022-10-11 MED ORDER — AMOXICILLIN-POT CLAVULANATE 875-125 MG PO TABS
1.0000 | ORAL_TABLET | Freq: Two times a day (BID) | ORAL | 0 refills | Status: AC
  Filled 2022-10-11: qty 14, 7d supply, fill #0

## 2022-10-11 MED ORDER — DOXYCYCLINE HYCLATE 100 MG PO CAPS
100.0000 mg | ORAL_CAPSULE | Freq: Two times a day (BID) | ORAL | 0 refills | Status: AC
  Filled 2022-10-11: qty 14, 7d supply, fill #0

## 2022-10-11 MED ORDER — NITROGLYCERIN 0.4 MG SL SUBL: 0.4000 mg | SUBLINGUAL_TABLET | Freq: Once | SUBLINGUAL | Status: DC

## 2022-10-11 NOTE — ED Notes (Signed)
Pt in bed, pt back from ct, pt denies pain, pt ambulatory to and from bathroom, pt reports that his L arm is feeling better.

## 2022-10-11 NOTE — Discharge Instructions (Signed)
Overall work-up is unremarkable.  Possible pneumonia in the lungs.  We will treat with antibiotics.  Follow-up with your primary care/oncology team.  Will need repeat imaging to see if this clears up.  Radiology seems to think that this is more likely infectious than a soft tissue/mass process.  If you develop fever or any worsening symptoms as discussed please return.

## 2022-10-11 NOTE — ED Triage Notes (Signed)
Patient reports that he woke approx 45 minutes ago with numbness in the left arm and then he began having intermittent mid chest burning and dizziness. Patient denies SOB and N/V

## 2022-10-11 NOTE — ED Notes (Signed)
Pt denies pain, pt states that he is ready to go home, sig other at bedside.

## 2022-10-11 NOTE — ED Notes (Signed)
Pt back from ct, pt denies pain, pt has no requests at this time.

## 2022-10-11 NOTE — ED Provider Notes (Signed)
[facility] DEPT Provider Note   CSN: [id number] Arrival date & time: 10/11/22  4259     History  Chief Complaint  Patient presents with   Chest Pain    Craig Collins is a 56 y.o. male.  Patient here with chest pain, some body aches.  Feels that he is having some aching pain in his chest, aching pain in his left upper arm.  Felt like his arm was tingling and felt like it had fallen asleep.  Currently he is having some discomfort in his left triceps area that he describes as numbness but more like achiness.  Denies any vision changes.  Has felt lightheaded at times but nothing currently.  Denies any shortness of breath.  He has a history of prostate cancer on maintenance chemotherapy.  History of hypertension and acid reflux.  Denies any vision changes, speech changes, arm or leg weakness.  Right now he is feeling okay just some achiness in his chest wall.  He did do some manual labor yesterday.  He said he spent some time waxing his car.  Chest discomfort started earlier this morning.  The history is provided by the patient.       Home Medications Prior to Admission medications   Medication Sig Start Date End Date Taking? Authorizing Provider  amoxicillin-clavulanate (AUGMENTIN) 875-125 MG tablet Take 1 tablet by mouth every 12 (twelve) hours for 7 days. 10/11/22 10/18/22 Yes Ivet Guerrieri, DO  doxycycline (VIBRAMYCIN) 100 MG capsule Take 1 capsule (100 mg total) by mouth 2 (two) times daily for 7 days. 10/11/22 10/18/22 Yes Bedford Winsor, DO  aspirin EC 81 MG tablet Take 81 mg by mouth daily.    [provider]  Calcium Carbonate-Vitamin D (CALCIUM 500/D) 500-125 MG-UNIT TABS Take 1 tablet by mouth daily.    [provider]  cyclobenzaprine (FLEXERIL) 10 MG tablet Take 1 tablet (10 mg total) by mouth 3 (three) times daily as needed for muscle spasms. Patient not taking: Reported on 09/04/2022 02/07/22   Vevelyn Francois, NP   enzalutamide Gillermina Phy) 40 MG capsule Take 4 capsules (160 mg total) by mouth daily. 07/08/22   [name], MD  hydrochlorothiazide (HYDRODIURIL) 25 MG tablet TAKE 1/2 TABLET (12.5 MG TOTAL) BY MOUTH DAILY. 02/07/22   Vevelyn Francois, NP  ibuprofen (ADVIL) 800 MG tablet TAKE 1 TABLET (800 MG TOTAL) BY MOUTH EVERY 8 (EIGHT) HOURS AS NEEDED. Patient taking differently: Take 800 mg by mouth every 8 (eight) hours as needed for moderate pain. 02/07/22 02/07/23  Vevelyn Francois, NP  lidocaine (XYLOCAINE) 2 % solution Rinse gargle as directed 15 mLs in the mouth or throat every 4 (four) hours as needed (esophagitis discomfort). Patient not taking: Reported on 09/04/2022 08/08/22   [place] Donnamarie Poag, DO  lisinopril (ZESTRIL) 20 MG tablet TAKE 1 TABLET (20 MG TOTAL) BY MOUTH DAILY. 08/27/22 08/27/23  [name], NP  LORazepam (ATIVAN) 0.5 MG tablet Take 1 tablet by mouth 30 minutes prior to radiation for claustrophobia. Patient not taking: Reported on 09/04/2022 07/14/22   Hayden Pedro, PA-C  nitroGLYCERIN (NITROSTAT) 0.4 MG SL tablet Place 1 tablet under the tongue every 5 minutes as needed for chest pain (pain with swallowing food).  **NOT USING FOR CARDIAC CHEST PAIN** Patient not taking: Reported on 09/04/2022 08/08/22   [place] Donnamarie Poag, DO  pantoprazole (PROTONIX) 40 MG tablet Take 1 tablet (40 mg total) by mouth daily. 08/08/22 11/06/22  [place] Eric J, DO  sucralfate (CARAFATE) 1 GM/10ML suspension Take 10 mLs (1 g total) by mouth 4 (four) times daily -  with meals and at bedtime. Patient not taking: Reported on 09/04/2022 08/08/22 11/06/22  [place] Donnamarie Poag, DO  tamsulosin (FLOMAX) 0.4 MG CAPS capsule TAKE 1 CAPSULE (0.4 MG TOTAL) BY MOUTH DAILY. 10/02/22 10/02/23  [name], NP  vitamin B-12 1000 MCG tablet Take 1 tablet (1,000 mcg total) by mouth daily. 10/15/17   Lavina Hamman, MD  XTANDI 40 MG tablet Take 4 tablets ('160mg'$ ) by mouth once daily as directed by physician. 09/25/22   [name], MD      Allergies    Patient has no known allergies.    Review of Systems   Review of Systems  Physical Exam Updated Vital Signs BP (!) 150/92 (BP Location: Left Arm)   Pulse 76   Temp 98.8 F (37.1 C) (Oral)   Resp 17   Ht '5\' 11"'$  (1.803 m)   Wt 108.9 kg   SpO2 100%   BMI 33.47 kg/m  Physical Exam Vitals and nursing note reviewed.  Constitutional:      General: He is not in acute distress.    Appearance: He is well-developed. He is not ill-appearing.  HENT:     Head: Normocephalic and atraumatic.  Eyes:     Extraocular Movements: Extraocular movements intact.     Conjunctiva/sclera: Conjunctivae normal.     Pupils: Pupils are equal, round, and reactive to light.  Cardiovascular:     Rate and Rhythm: Normal rate and regular rhythm.     Pulses:          Radial pulses are 2+ on the right side and 2+ on the left side.     Heart sounds: Normal heart sounds. No murmur heard. Pulmonary:     Effort: Pulmonary effort is normal. No respiratory distress.     Breath sounds: Normal breath sounds.  Abdominal:     Palpations: Abdomen is soft.     Tenderness: There is no abdominal tenderness.  Musculoskeletal:        General: No swelling. Normal range of motion.     Cervical back: Normal range of motion and neck supple.     Right lower leg: No edema.     Left lower leg: No edema.  Skin:    General: Skin is warm and dry.     Capillary Refill: Capillary refill takes less than 2 seconds.  Neurological:     General: No focal deficit present.     Mental Status: He is alert.     Comments: 5+ out of 5 strength throughout, normal sensation except for may be some diminished sensation in his left tricep area, normal speech, normal visual fields, normal cerebellar signs  Psychiatric:        Mood and Affect: Mood normal.     ED Results / Procedures / Treatments   Labs (all labs ordered are listed, but only abnormal results are displayed) Labs Reviewed  BASIC METABOLIC  PANEL - Abnormal; Notable for the following components:      Result Value   Glucose, Bld 101 (*)    Creatinine, Ser 1.26 (*)    All other components within normal limits  CBC - Abnormal; Notable for the following components:   WBC 3.6 (*)    RBC 3.92 (*)    Hemoglobin 10.8 (*)    HCT 33.8 (*)    RDW 15.8 (*)    All other  components within normal limits  D-DIMER, QUANTITATIVE - Abnormal; Notable for the following components:   D-Dimer, Quant 0.91 (*)    All other components within normal limits  LIPASE, BLOOD  HEPATIC FUNCTION PANEL  TROPONIN I (HIGH SENSITIVITY)    EKG EKG Interpretation  Date/Time:  Saturday October 11 2022 09:42:32 EDT Ventricular Rate:  98 PR Interval:  168 QRS Duration: 89 QT Interval:  405 QTC Calculation: 518 R Axis:   74 Text Interpretation: Sinus rhythm Ventricular premature complex Borderline T abnormalities, diffuse leads Confirmed by Lennice Sites ([id number]) on 10/11/2022 9:47:12 AM  Radiology CT Angio Chest PE W and/or Wo Contrast  Result Date: 10/11/2022 CLINICAL DATA:  56 year old male with acute chest pain and shortness of breath today. History of metastatic prostate cancer. EXAM: CT ANGIOGRAPHY CHEST WITH CONTRAST TECHNIQUE: Multidetector CT imaging of the chest was performed using the standard protocol during bolus administration of intravenous contrast. Multiplanar CT image reconstructions and MIPs were obtained to evaluate the vascular anatomy. RADIATION DOSE REDUCTION: This exam was performed according to the departmental dose-optimization program which includes automated exposure control, adjustment of the mA and/or kV according to patient size and/or use of iterative reconstruction technique. CONTRAST:  113m OMNIPAQUE IOHEXOL 350 MG/ML SOLN COMPARISON:  06/10/2022 PET CT, 10/11/2022 chest radiograph and other studies. FINDINGS: Cardiovascular: This is a technically adequate study for evaluation of pulmonary emboli. No pulmonary emboli are  identified. Heart size is UPPER limits of normal. There is no evidence of thoracic aortic aneurysm or pericardial effusion. Mediastinum/Nodes: LEFT supraclavicular lymph nodes have decreased in size since 06/10/2022. Index node now measures 1.8 cm (image 22: Series 4), previously 3.7 cm. No new or enlarging lymph nodes are identified. There is no evidence of mediastinal mass. The visualized thyroid, trachea and esophagus are unremarkable. Lungs/Pleura: New patchy airspace opacities and focal consolidations throughout the LEFT UPPER lobe and LEFT LOWER lobe are noted, favor multifocal infection over other etiologies. The RIGHT lung is clear. There is no evidence of pleural effusion or pneumothorax. Upper Abdomen: No acute abnormality. Musculoskeletal: Metastatic lesions within the visualized bony structures are again noted. No acute bony abnormality is identified. Review of the MIP images confirms the above findings. IMPRESSION: 1. New patchy airspace opacities and focal consolidations throughout the LEFT UPPER lobe and LEFT LOWER lobe, favor multifocal infection over other etiologies. Clinical follow-up recommended. 2. No evidence of pulmonary emboli. 3. Decreased size of LEFT supraclavicular lymph nodes since 06/10/2022. 4. Bony metastatic disease again noted Electronically Signed   By: JMargarette CanadaM.D.   On: 10/11/2022 11:17   CT Head Wo Contrast  Result Date: 10/11/2022 CLINICAL DATA:  Chest and left arm pain with dizziness EXAM: CT HEAD WITHOUT CONTRAST TECHNIQUE: Contiguous axial images were obtained from the base of the skull through the vertex without intravenous contrast. RADIATION DOSE REDUCTION: This exam was performed according to the departmental dose-optimization program which includes automated exposure control, adjustment of the mA and/or kV according to patient size and/or use of iterative reconstruction technique. COMPARISON:  None Available. FINDINGS: Brain: No evidence of acute infarction,  hemorrhage, hydrocephalus, extra-axial collection or mass lesion/mass effect. Vascular: No hyperdense vessel or unexpected calcification. Skull: No fracture or focal lesion. Sinuses/Orbits: No acute finding. Other: None. IMPRESSION: No intracranial hemorrhage, large territory infarction, or mass effect. Electronically Signed   By: LDarrin NipperM.D.   On: 10/11/2022 10:29   DG Chest 2 View  Result Date: 10/11/2022 CLINICAL DATA:  Chest pain, arm numbness, dizziness. EXAM:  CHEST - 2 VIEW COMPARISON:  Chest x-rays dated 08/04/2022 and 10/01/2006. FINDINGS: Opacity at the LEFT lung apex, either pulmonary or osseous in etiology. Suspected mild scarring or atelectasis at the LEFT lung base. RIGHT lung is clear. No pleural effusion or pneumothorax is seen. IMPRESSION: 1. New opacity at the LEFT lung apex. Findings could represent neoplastic process or pneumonia, although favor pneumonia given the normal appearance of this area on chest x-ray of 08/04/2022. Recommend chest CT for further characterization. 2. Probable mild scarring or atelectasis at the LEFT lung base. Electronically Signed   By: Franki Cabot M.D.   On: 10/11/2022 10:02    Procedures Procedures    Medications Ordered in ED Medications  sodium chloride 0.9 % bolus 1,000 mL (1,000 mLs Intravenous New Bag/Given 10/11/22 1040)  iohexol (OMNIPAQUE) 350 MG/ML injection 100 mL (100 mLs Intravenous Contrast Given 10/11/22 1057)    ED Course/ Medical Decision Making/ A&P                           Medical Decision Making Amount and/or Complexity of Data Reviewed Labs: ordered. Radiology: ordered.  Risk Prescription drug management.   Craig Collins is here with chest pain.  History of hypertension, prostate cancer on maintenance chemotherapy.  Patient states he woke up this morning with chest discomfort, mild dizziness, sweatiness, some pain to his left arm, numbness and tingling to his left arm.  Feels an achiness to his left tricep area.   Denies any shortness of breath or cough or sputum production.  Denies any weakness, speech changes, vision changes.  Vital signs are unremarkable.  EKG per my review and interpretation shows some nonspecific ST changes. he has not had much chest discomfort recently but sometimes he feels undigested at times.  Differential diagnosis is ACS versus less likely PE, electrolyte abnormality or anemia or infectious process.  His numbness/tingling/pain to the left arm seems less likely to be stroke related but given his history of cancer we will get a head CT.  Primarily I think that this could be muscular or cardiac or pulmonary/infectious.  We will get CBC, CMP, lipase, troponin, D-dimer, chest x-ray, CT scan head.  Overall x-ray per radiology report shows may be new opacity in the left lung apex.  I reviewed and interpreted this x-ray as well.  Findings could be neoplastic process and pneumonia.  He had normal appearance of this area back in August.  We will get a CT scan to further evaluate.  Per radiology report CT scan of the head is unremarkable.  No head bleed or head mass or stroke.  CT scan of the chest shows favors to show per radiology report pneumonia on the left side of the lung.  He has decreased size of some lymph nodes nearby.  Overall he has no fever or cough.  Does not have leukocytosis or anemia or electrolyte abnormality per my further review and interpretation of labs.  Troponin is normal.  D-dimer is elevated but there is no PE found on CT scan of chest.  Overall given his history is possible that this could be a neoplastic process.  Overall shared decision was made to start him on antibiotics and have him follow-up outpatient with his primary care doctor to see how he responds to therapy and the need for likely repeat imaging to see if this area has cleared up.  He understands return precautions including fever, worsening discomfort or shortness of breath.  Patient discharged in good  condition.  This chart was dictated using voice recognition software.  Despite best efforts to proofread,  errors can occur which can change the documentation meaning.         Final Clinical Impression(s) / ED Diagnoses Final diagnoses:  Pneumonia due to infectious organism, unspecified laterality, unspecified part of lung    Rx / DC Orders ED Discharge Orders          Ordered    amoxicillin-clavulanate (AUGMENTIN) 875-125 MG tablet  Every 12 hours        10/11/22 1229    doxycycline (VIBRAMYCIN) 100 MG capsule  2 times daily        10/11/22 [place], [place], DO 10/11/22 1235

## 2022-10-16 ENCOUNTER — Inpatient Hospital Stay (HOSPITAL_BASED_OUTPATIENT_CLINIC_OR_DEPARTMENT_OTHER): Payer: Medicaid Other | Admitting: Oncology

## 2022-10-16 ENCOUNTER — Inpatient Hospital Stay: Payer: Medicaid Other

## 2022-10-16 VITALS — BP 125/86 | HR 85 | Temp 97.6°F | Resp 18 | Ht 71.0 in | Wt 252.6 lb

## 2022-10-16 DIAGNOSIS — C7951 Secondary malignant neoplasm of bone: Secondary | ICD-10-CM

## 2022-10-16 DIAGNOSIS — C61 Malignant neoplasm of prostate: Secondary | ICD-10-CM | POA: Diagnosis not present

## 2022-10-16 DIAGNOSIS — R590 Localized enlarged lymph nodes: Secondary | ICD-10-CM | POA: Diagnosis not present

## 2022-10-16 DIAGNOSIS — Z79899 Other long term (current) drug therapy: Secondary | ICD-10-CM | POA: Diagnosis not present

## 2022-10-16 LAB — CBC WITH DIFFERENTIAL (CANCER CENTER ONLY)
Abs Immature Granulocytes: 0.03 10*3/uL (ref 0.00–0.07)
Basophils Absolute: 0 10*3/uL (ref 0.0–0.1)
Basophils Relative: 0 %
Eosinophils Absolute: 0.1 10*3/uL (ref 0.0–0.5)
Eosinophils Relative: 3 %
HCT: 33.7 % — ABNORMAL LOW (ref 39.0–52.0)
Hemoglobin: 11.2 g/dL — ABNORMAL LOW (ref 13.0–17.0)
Immature Granulocytes: 1 %
Lymphocytes Relative: 15 %
Lymphs Abs: 0.7 10*3/uL (ref 0.7–4.0)
MCH: 27.6 pg (ref 26.0–34.0)
MCHC: 33.2 g/dL (ref 30.0–36.0)
MCV: 83 fL (ref 80.0–100.0)
Monocytes Absolute: 0.4 10*3/uL (ref 0.1–1.0)
Monocytes Relative: 8 %
Neutro Abs: 3.4 10*3/uL (ref 1.7–7.7)
Neutrophils Relative %: 73 %
Platelet Count: 286 10*3/uL (ref 150–400)
RBC: 4.06 MIL/uL — ABNORMAL LOW (ref 4.22–5.81)
RDW: 15 % (ref 11.5–15.5)
WBC Count: 4.6 10*3/uL (ref 4.0–10.5)
nRBC: 0 % (ref 0.0–0.2)

## 2022-10-16 LAB — CMP (CANCER CENTER ONLY)
ALT: 9 U/L (ref 0–44)
AST: 10 U/L — ABNORMAL LOW (ref 15–41)
Albumin: 4 g/dL (ref 3.5–5.0)
Alkaline Phosphatase: 186 U/L — ABNORMAL HIGH (ref 38–126)
Anion gap: 10 (ref 5–15)
BUN: 22 mg/dL — ABNORMAL HIGH (ref 6–20)
CO2: 22 mmol/L (ref 22–32)
Calcium: 9.7 mg/dL (ref 8.9–10.3)
Chloride: 105 mmol/L (ref 98–111)
Creatinine: 1.35 mg/dL — ABNORMAL HIGH (ref 0.61–1.24)
GFR, Estimated: >60 mL/min (ref >60)
Glucose, Bld: 128 mg/dL — ABNORMAL HIGH (ref 70–99)
Potassium: 3.3 mmol/L — ABNORMAL LOW (ref 3.5–5.1)
Sodium: 137 mmol/L (ref 135–145)
Total Bilirubin: 0.3 mg/dL (ref 0.3–1.2)
Total Protein: 7.5 g/dL (ref 6.5–8.1)

## 2022-10-16 NOTE — Progress Notes (Signed)
Hematology and Oncology Follow Up Visit  FRANZ SVEC [id number] February 26, 1966 56 y.o. 10/16/2022 9:01 AM Cain Saupe, Diona Foley, NP   Principle Diagnosis: 56 year old with advanced prostate cancer with lymphadenopathy diagnosed in July 2023.  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   Prior Therapy: He was started on androgen deprivation therapy initially under the care of Dr. Karsten Ro. His PSA dropped to 2.77 in November 2021.  PSA started to rise and February 2022 up to 5.87.  In November 2022 his PSA was 99.3 and testosterone level of 19.3.    He has failed to follow-up periodically and to reestablish care with Dr. Junious Silk. PSA on May 28, 2022 was 22.1 and PSMA PET scan obtained on June 10, 2022 showed intense uptake within the prostate gland and extension into the seminal vesicle.  Bulky metastatic disease in the pelvis, periaortic and retroperitoneal lymphadenopathy.  Palliative radiation therapy to the left chest wall and left supraclavicular nodal region completed 2023.  He completed 30 Gray in 10 fractions.  Current therapy: Xtandi 160 mg daily started on July 15, 2022.  Interim History: Mr. Timmons presents today for a follow-up visit.  Since the last visit, he has reported some arthralgias and myalgias in the last 2 weeks.  He was seen in the emergency department and a CT scan of the chest obtained and showed no evidence of pulmonary embolism and was diagnosed with pneumonia.  He reports his muscle pain is manageable and takes Tylenol.  He denies any nausea vomiting or abdominal pain.  He denies any hematuria or dysuria.     Medications: Updated on review. Current Outpatient Medications  Medication Sig Dispense Refill   amoxicillin-clavulanate (AUGMENTIN) 875-125 MG tablet Take 1 tablet by mouth every 12 (twelve) hours for 7 days. 14 tablet 0   aspirin EC 81 MG tablet Take 81 mg by mouth daily.     Calcium  Carbonate-Vitamin D (CALCIUM 500/D) 500-125 MG-UNIT TABS Take 1 tablet by mouth daily.     cyclobenzaprine (FLEXERIL) 10 MG tablet Take 1 tablet (10 mg total) by mouth 3 (three) times daily as needed for muscle spasms. (Patient not taking: Reported on 09/04/2022) 60 tablet 3   doxycycline (VIBRAMYCIN) 100 MG capsule Take 1 capsule (100 mg total) by mouth 2 (two) times daily for 7 days. 14 capsule 0   enzalutamide (XTANDI) 40 MG capsule Take 4 capsules (160 mg total) by mouth daily. 120 capsule 1   hydrochlorothiazide (HYDRODIURIL) 25 MG tablet TAKE 1/2 TABLET (12.5 MG TOTAL) BY MOUTH DAILY. 45 tablet 3   ibuprofen (ADVIL) 800 MG tablet TAKE 1 TABLET (800 MG TOTAL) BY MOUTH EVERY 8 (EIGHT) HOURS AS NEEDED. (Patient taking differently: Take 800 mg by mouth every 8 (eight) hours as needed for moderate pain.) 60 tablet 3   lidocaine (XYLOCAINE) 2 % solution Rinse gargle as directed 15 mLs in the mouth or throat every 4 (four) hours as needed (esophagitis discomfort). (Patient not taking: Reported on 09/04/2022) 100 mL 1   lisinopril (ZESTRIL) 20 MG tablet TAKE 1 TABLET (20 MG TOTAL) BY MOUTH DAILY. 90 tablet 3   LORazepam (ATIVAN) 0.5 MG tablet Take 1 tablet by mouth 30 minutes prior to radiation for claustrophobia. (Patient not taking: Reported on 09/04/2022) 10 tablet 0   nitroGLYCERIN (NITROSTAT) 0.4 MG SL tablet Place 1 tablet under the tongue every 5 minutes as needed for chest pain (pain with swallowing food).  **NOT  USING FOR CARDIAC CHEST PAIN** (Patient not taking: Reported on 09/04/2022) 25 tablet 0   pantoprazole (PROTONIX) 40 MG tablet Take 1 tablet (40 mg total) by mouth daily. 30 tablet 2   sucralfate (CARAFATE) 1 GM/10ML suspension Take 10 mLs (1 g total) by mouth 4 (four) times daily -  with meals and at bedtime. (Patient not taking: Reported on 09/04/2022) 1200 mL 2   tamsulosin (FLOMAX) 0.4 MG CAPS capsule TAKE 1 CAPSULE (0.4 MG TOTAL) BY MOUTH DAILY. 30 capsule 11   vitamin B-12 1000 MCG  tablet Take 1 tablet (1,000 mcg total) by mouth daily. 30 tablet 0   XTANDI 40 MG tablet Take 4 tablets ('160mg'$ ) by mouth once daily as directed by physician. 120 tablet 0   No current facility-administered medications for this visit.     Allergies: No Known Allergies    Physical Exam: Blood pressure 125/86, pulse 85, temperature 97.6 F (36.4 C), temperature source Temporal, resp. rate 18, height '5\' 11"'$  (1.803 m), weight 252 lb 9.6 oz (114.6 kg), SpO2 99 %.  ECOG: 1   General appearance: Alert, awake without any distress. Head: Atraumatic without abnormalities Oropharynx: Without any thrush or ulcers. Eyes: No scleral icterus. Lymph nodes: No lymphadenopathy noted in the cervical, supraclavicular, or axillary nodes Heart:regular rate and rhythm, without any murmurs or gallops.   Lung: Clear to auscultation without any rhonchi, wheezes or dullness to percussion. Abdomin: Soft, nontender without any shifting dullness or ascites. Musculoskeletal: No clubbing or cyanosis. Neurological: No motor or sensory deficits. Skin: No rashes or lesions.     Lab Results: Lab Results  Component Value Date   WBC 4.6 10/16/2022   HGB 11.2 (L) 10/16/2022   HCT 33.7 (L) 10/16/2022   MCV 83.0 10/16/2022   PLT 286 10/16/2022   PSA 17.0 (H) 10/28/2017     Chemistry      Component Value Date/Time   NA 138 10/11/2022 1010   NA 139 09/04/2022 0944   K 3.6 10/11/2022 1010   CL 108 10/11/2022 1010   CO2 22 10/11/2022 1010   BUN 18 10/11/2022 1010   BUN 13 09/04/2022 0944   CREATININE 1.26 (H) 10/11/2022 1010   CREATININE 1.17 09/11/2022 1122   CREATININE 1.39 (H) 10/28/2017 0958      Component Value Date/Time   CALCIUM 9.6 10/11/2022 1010   ALKPHOS 153 (H) 10/11/2022 1010   AST 14 (L) 10/11/2022 1010   AST 10 (L) 09/11/2022 1122   ALT 13 10/11/2022 1010   ALT 8 09/11/2022 1122   BILITOT 0.5 10/11/2022 1010   BILITOT 0.3 09/11/2022 1122        Latest Reference Range & Units  11/25/17 10:54 04/15/19 11:16 06/18/20 09:21 08/19/22 13:38 09/11/22 11:22  Prostate Specific Ag, Serum 0.0 - 4.0 ng/mL 21.2 (H) 42.3 (H) 8.5 (H) 0.9 0.5  (H): Data is abnormally high   Impression and Plan:   56 year old with:  1.  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  He is experiencing excellent response with a PSA dropped down to 0.5 in September 2023.  Risks and benefits of continuing Xtandi as well as dose reduction options were discussed.  Complications that include arthralgias, myalgias, hypertension and hematuria among others were reiterated.  Alternative treatment options were also reviewed at this time.   2.  Supraclavicular adenopathy and bone pain: Improved at this time with reduction in his supraclavicular adenopathy based on imaging studies.  3.  Bone directed therapy: He is to proceed with calcium and vitamin D supplements as well as dental clearance before proceeding with Xgeva.   4.  Prognosis and goals of care: Aggressive measures are warranted given his excellent performance status.  5.  Androgen deprivation therapy: He will receive that under the care of Dr. Junious Silk indefinitely.       6.  Follow-up: He will return in 8 weeks for a follow-up.     30  minutes were spent on this visit.  The time was dedicated to reviewing laboratory data, disease status update and outlining future plan of care discussion.    Zola Button, MD 10/26/20239:01 AM

## 2022-10-18 LAB — PROSTATE-SPECIFIC AG, SERUM (LABCORP): Prostate Specific Ag, Serum: 0.4 ng/mL (ref 0.0–4.0)

## 2022-10-20 ENCOUNTER — Encounter: Payer: Self-pay | Admitting: Genetic Counselor

## 2022-10-22 ENCOUNTER — Other Ambulatory Visit: Payer: Self-pay | Admitting: Oncology

## 2022-10-22 ENCOUNTER — Other Ambulatory Visit: Payer: Self-pay

## 2022-10-22 ENCOUNTER — Ambulatory Visit (INDEPENDENT_AMBULATORY_CARE_PROVIDER_SITE_OTHER): Payer: Medicaid Other | Admitting: Nurse Practitioner

## 2022-10-22 ENCOUNTER — Encounter: Payer: Self-pay | Admitting: Nurse Practitioner

## 2022-10-22 ENCOUNTER — Telehealth: Payer: Self-pay | Admitting: *Deleted

## 2022-10-22 VITALS — BP 117/67 | HR 85 | Ht 71.0 in | Wt 267.0 lb

## 2022-10-22 DIAGNOSIS — N529 Male erectile dysfunction, unspecified: Secondary | ICD-10-CM

## 2022-10-22 DIAGNOSIS — B370 Candidal stomatitis: Secondary | ICD-10-CM | POA: Diagnosis not present

## 2022-10-22 DIAGNOSIS — J189 Pneumonia, unspecified organism: Secondary | ICD-10-CM | POA: Diagnosis not present

## 2022-10-22 DIAGNOSIS — J181 Lobar pneumonia, unspecified organism: Secondary | ICD-10-CM | POA: Diagnosis not present

## 2022-10-22 DIAGNOSIS — Z0000 Encounter for general adult medical examination without abnormal findings: Secondary | ICD-10-CM

## 2022-10-22 DIAGNOSIS — Z0001 Encounter for general adult medical examination with abnormal findings: Secondary | ICD-10-CM | POA: Diagnosis not present

## 2022-10-22 MED ORDER — VIAGRA 25 MG PO TABS
25.0000 mg | ORAL_TABLET | Freq: Every day | ORAL | 0 refills | Status: AC | PRN
  Filled 2022-10-22 – 2022-11-04 (×3): qty 10, 10d supply, fill #0

## 2022-10-22 MED ORDER — NYSTATIN 100000 UNIT/ML MT SUSP
5.0000 mL | Freq: Four times a day (QID) | OROMUCOSAL | 0 refills | Status: DC
  Filled 2022-10-22 – 2022-10-23 (×2): qty 60, 3d supply, fill #0

## 2022-10-22 NOTE — Patient Instructions (Signed)
1. Thrush  - nystatin (MYCOSTATIN) 100000 UNIT/ML suspension; Take 5 mLs (500,000 Units total) by mouth 4 (four) times daily.  Dispense: 60 mL; Refill: 0  2. Pneumonia of left lower lobe due to infectious organism  Will need repeat imaging  Will need flu shot  Follow up:  Follow up in 2 weeks or sooner if needed

## 2022-10-22 NOTE — Telephone Encounter (Signed)
PC to patient, informed him his PSA is 0.3, he verbalizes understanding.

## 2022-10-22 NOTE — Progress Notes (Signed)
$'@Patient'x$  ID: Craig Collins, male    DOB: July 12, 1966, 56 y.o.   MRN: [id number]  Chief Complaint  Patient presents with   Annual Exam    Referring provider: [name], NP   HPI  Craig Collins presents for follow up.He . has a past medical history of Benign prostate hyperplasia, Elevated PSA, Erectile dysfunction, Hypertension, Obesity (BMI 35.0-39.9 without comorbidity), Prostate cancer ([place]) (05/2019), Sleep apnea, and Urinary retention.   Patient presents today for annual physical and ED follow-up.  He went to the ED on 10/11/2022 with chest pain and was diagnosed with pneumonia.  He did have CT scan of chest.  Repeat imaging was recommended due to possibility for mets.  Patient was prescribed Augmentin and azithromycin.  He did take medications as directed and is feeling much better.  We discussed that we will repeat imaging the week after Thanksgiving.  Patient does complain of thrush to male from recent antibiotic use.  We discussed that we will order nystatin suspension. Denies f/c/s, n/v/d, hemoptysis, PND, leg swelling Denies chest pain or edema   Needs repeat CT scan to lungs around Thanksgiving     No Known Allergies  Immunization History  Administered Date(s) Administered   Influenza,inj,Quad PF,6+ Mos 10/08/2020   PFIZER(Purple Top)SARS-COV-2 Vaccination 03/06/2020, 04/19/2020, 11/13/2020    Past Medical History:  Diagnosis Date   Benign prostate hyperplasia    Elevated PSA    Erectile dysfunction    Hypertension    Obesity (BMI 35.0-39.9 without comorbidity)    Prostate cancer ([place]) 05/2019   Sleep apnea    Urinary retention     Tobacco History: Social History   Tobacco Use  Smoking Status Never  Smokeless Tobacco Never   Counseling given: Not Answered   Outpatient Encounter Medications as of 10/22/2022  Medication Sig   aspirin EC 81 MG tablet Take 81 mg by mouth daily.   Calcium Carbonate-Vitamin D (CALCIUM 500/D) 500-125 MG-UNIT TABS  Take 1 tablet by mouth daily.   enzalutamide (XTANDI) 40 MG capsule Take 4 capsules (160 mg total) by mouth daily.   hydrochlorothiazide (HYDRODIURIL) 25 MG tablet TAKE 1/2 TABLET (12.5 MG TOTAL) BY MOUTH DAILY.   ibuprofen (ADVIL) 800 MG tablet TAKE 1 TABLET (800 MG TOTAL) BY MOUTH EVERY 8 (EIGHT) HOURS AS NEEDED. (Patient taking differently: Take 800 mg by mouth every 8 (eight) hours as needed for moderate pain.)   lisinopril (ZESTRIL) 20 MG tablet TAKE 1 TABLET (20 MG TOTAL) BY MOUTH DAILY.   nystatin (MYCOSTATIN) 100000 UNIT/ML suspension Take 5 mLs (500,000 Units total) by mouth 4 (four) times daily.   tamsulosin (FLOMAX) 0.4 MG CAPS capsule TAKE 1 CAPSULE (0.4 MG TOTAL) BY MOUTH DAILY.   VIAGRA 25 MG tablet Take 1 tablet (25 mg total) by mouth daily as needed for erectile dysfunction.   vitamin B-12 1000 MCG tablet Take 1 tablet (1,000 mcg total) by mouth daily.   cyclobenzaprine (FLEXERIL) 10 MG tablet Take 1 tablet (10 mg total) by mouth 3 (three) times daily as needed for muscle spasms. (Patient not taking: Reported on 09/04/2022)   lidocaine (XYLOCAINE) 2 % solution Rinse gargle as directed 15 mLs in the mouth or throat every 4 (four) hours as needed (esophagitis discomfort). (Patient not taking: Reported on 09/04/2022)   LORazepam (ATIVAN) 0.5 MG tablet Take 1 tablet by mouth 30 minutes prior to radiation for claustrophobia. (Patient not taking: Reported on 09/04/2022)   nitroGLYCERIN (NITROSTAT) 0.4 MG SL tablet Place 1 tablet  under the tongue every 5 minutes as needed for chest pain (pain with swallowing food).  **NOT USING FOR CARDIAC CHEST PAIN** (Patient not taking: Reported on 09/04/2022)   pantoprazole (PROTONIX) 40 MG tablet Take 1 tablet (40 mg total) by mouth daily. (Patient not taking: Reported on 10/22/2022)   sucralfate (CARAFATE) 1 GM/10ML suspension Take 10 mLs (1 g total) by mouth 4 (four) times daily -  with meals and at bedtime. (Patient not taking: Reported on 09/04/2022)    XTANDI 40 MG tablet Take 4 tablets ('160mg'$ ) by mouth once daily as directed by physician. (Patient not taking: Reported on 10/22/2022)   No facility-administered encounter medications on file as of 10/22/2022.     Review of Systems  Review of Systems  Constitutional: Negative.   HENT: Negative.    Cardiovascular: Negative.   Gastrointestinal: Negative.   Allergic/Immunologic: Negative.   Neurological: Negative.   Psychiatric/Behavioral: Negative.         Physical Exam  BP 117/67   Pulse 85   Ht '5\' 11"'$  (1.803 m)   Wt 267 lb (121.1 kg)   SpO2 100%   BMI 37.24 kg/m   Wt Readings from Last 5 Encounters:  10/22/22 267 lb (121.1 kg)  10/16/22 252 lb 9.6 oz (114.6 kg)  10/11/22 240 lb (108.9 kg)  09/04/22 253 lb 9.6 oz (115 kg)  08/19/22 252 lb 8 oz (114.5 kg)     Physical Exam Vitals and nursing note reviewed.  Constitutional:      General: He is not in acute distress.    Appearance: He is well-developed.  Cardiovascular:     Rate and Rhythm: Normal rate and regular rhythm.  Pulmonary:     Effort: Pulmonary effort is normal.     Breath sounds: Normal breath sounds.  Skin:    General: Skin is warm and dry.  Neurological:     Mental Status: He is alert and oriented to person, place, and time.      Lab Results:  CBC    Component Value Date/Time   WBC 4.6 10/16/2022 0840   WBC 3.6 (L) 10/11/2022 1010   RBC 4.06 (L) 10/16/2022 0840   HGB 11.2 (L) 10/16/2022 0840   HGB 10.0 (L) 09/04/2022 0944   HCT 33.7 (L) 10/16/2022 0840   HCT 30.4 (L) 09/04/2022 0944   PLT 286 10/16/2022 0840   PLT 289 09/04/2022 0944   MCV 83.0 10/16/2022 0840   MCV 81 09/04/2022 0944   MCH 27.6 10/16/2022 0840   MCHC 33.2 10/16/2022 0840   RDW 15.0 10/16/2022 0840   RDW 15.4 09/04/2022 0944   LYMPHSABS 0.7 10/16/2022 0840   LYMPHSABS 2.0 06/18/2020 0921   MONOABS 0.4 10/16/2022 0840   EOSABS 0.1 10/16/2022 0840   EOSABS 0.2 06/18/2020 0921   BASOSABS 0.0 10/16/2022 0840    BASOSABS 0.0 06/18/2020 0921    BMET    Component Value Date/Time   NA 137 10/16/2022 0840   NA 139 09/04/2022 0944   K 3.3 (L) 10/16/2022 0840   CL 105 10/16/2022 0840   CO2 22 10/16/2022 0840   GLUCOSE 128 (H) 10/16/2022 0840   BUN 22 (H) 10/16/2022 0840   BUN 13 09/04/2022 0944   CREATININE 1.35 (H) 10/16/2022 0840   CREATININE 1.39 (H) 10/28/2017 0958   CALCIUM 9.7 10/16/2022 0840   GFRNONAA >60 10/16/2022 0840   GFRAA 65 06/18/2020 0921    BNP No results found for: "BNP"  ProBNP No results found for: "PROBNP"  Imaging: CT Angio Chest PE W and/or Wo Contrast  Result Date: 10/11/2022 CLINICAL DATA:  56 year old male with acute chest pain and shortness of breath today. History of metastatic prostate cancer. EXAM: CT ANGIOGRAPHY CHEST WITH CONTRAST TECHNIQUE: Multidetector CT imaging of the chest was performed using the standard protocol during bolus administration of intravenous contrast. Multiplanar CT image reconstructions and MIPs were obtained to evaluate the vascular anatomy. RADIATION DOSE REDUCTION: This exam was performed according to the departmental dose-optimization program which includes automated exposure control, adjustment of the mA and/or kV according to patient size and/or use of iterative reconstruction technique. CONTRAST:  193m OMNIPAQUE IOHEXOL 350 MG/ML SOLN COMPARISON:  06/10/2022 PET CT, 10/11/2022 chest radiograph and other studies. FINDINGS: Cardiovascular: This is a technically adequate study for evaluation of pulmonary emboli. No pulmonary emboli are identified. Heart size is UPPER limits of normal. There is no evidence of thoracic aortic aneurysm or pericardial effusion. Mediastinum/Nodes: LEFT supraclavicular lymph nodes have decreased in size since 06/10/2022. Index node now measures 1.8 cm (image 22: Series 4), previously 3.7 cm. No new or enlarging lymph nodes are identified. There is no evidence of mediastinal mass. The visualized thyroid, trachea  and esophagus are unremarkable. Lungs/Pleura: New patchy airspace opacities and focal consolidations throughout the LEFT UPPER lobe and LEFT LOWER lobe are noted, favor multifocal infection over other etiologies. The RIGHT lung is clear. There is no evidence of pleural effusion or pneumothorax. Upper Abdomen: No acute abnormality. Musculoskeletal: Metastatic lesions within the visualized bony structures are again noted. No acute bony abnormality is identified. Review of the MIP images confirms the above findings. IMPRESSION: 1. New patchy airspace opacities and focal consolidations throughout the LEFT UPPER lobe and LEFT LOWER lobe, favor multifocal infection over other etiologies. Clinical follow-up recommended. 2. No evidence of pulmonary emboli. 3. Decreased size of LEFT supraclavicular lymph nodes since 06/10/2022. 4. Bony metastatic disease again noted Electronically Signed   By: JMargarette CanadaM.D.   On: 10/11/2022 11:17   CT Head Wo Contrast  Result Date: 10/11/2022 CLINICAL DATA:  Chest and left arm pain with dizziness EXAM: CT HEAD WITHOUT CONTRAST TECHNIQUE: Contiguous axial images were obtained from the base of the skull through the vertex without intravenous contrast. RADIATION DOSE REDUCTION: This exam was performed according to the departmental dose-optimization program which includes automated exposure control, adjustment of the mA and/or kV according to patient size and/or use of iterative reconstruction technique. COMPARISON:  None Available. FINDINGS: Brain: No evidence of acute infarction, hemorrhage, hydrocephalus, extra-axial collection or mass lesion/mass effect. Vascular: No hyperdense vessel or unexpected calcification. Skull: No fracture or focal lesion. Sinuses/Orbits: No acute finding. Other: None. IMPRESSION: No intracranial hemorrhage, large territory infarction, or mass effect. Electronically Signed   By: LDarrin NipperM.D.   On: 10/11/2022 10:29   DG Chest 2 View  Result Date:  10/11/2022 CLINICAL DATA:  Chest pain, arm numbness, dizziness. EXAM: CHEST - 2 VIEW COMPARISON:  Chest x-rays dated 08/04/2022 and 10/01/2006. FINDINGS: Opacity at the LEFT lung apex, either pulmonary or osseous in etiology. Suspected mild scarring or atelectasis at the LEFT lung base. RIGHT lung is clear. No pleural effusion or pneumothorax is seen. IMPRESSION: 1. New opacity at the LEFT lung apex. Findings could represent neoplastic process or pneumonia, although favor pneumonia given the normal appearance of this area on chest x-ray of 08/04/2022. Recommend chest CT for further characterization. 2. Probable mild scarring or atelectasis at the LEFT lung base. Electronically Signed   By: SCherlynn Kaiser  Enriqueta Shutter M.D.   On: 10/11/2022 10:02     Assessment & Plan:   Thrush - nystatin (MYCOSTATIN) 100000 UNIT/ML suspension; Take 5 mLs (500,000 Units total) by mouth 4 (four) times daily.  Dispense: 60 mL; Refill: 0  2. Pneumonia of left lower lobe due to infectious organism  Will need repeat imaging  Will need flu shot  Follow up:  Follow up in 2 weeks or sooner if needed     [name], NP 10/22/2022

## 2022-10-22 NOTE — Telephone Encounter (Signed)
-----   Message from Firas N Shadad, MD sent at 10/22/2022  8:29 AM EDT ----- Please let him know his PSA is down 

## 2022-10-22 NOTE — Assessment & Plan Note (Signed)
-   nystatin (MYCOSTATIN) 100000 UNIT/ML suspension; Take 5 mLs (500,000 Units total) by mouth 4 (four) times daily.  Dispense: 60 mL; Refill: 0  2. Pneumonia of left lower lobe due to infectious organism  Will need repeat imaging  Will need flu shot  Follow up:  Follow up in 2 weeks or sooner if needed

## 2022-10-22 NOTE — Progress Notes (Signed)
Thrush on tongue.

## 2022-10-23 ENCOUNTER — Other Ambulatory Visit: Payer: Self-pay

## 2022-10-23 ENCOUNTER — Other Ambulatory Visit (HOSPITAL_COMMUNITY): Payer: Self-pay

## 2022-10-23 LAB — COMPREHENSIVE METABOLIC PANEL
ALT: 7 IU/L (ref 0–44)
AST: 6 IU/L (ref 0–40)
Albumin/Globulin Ratio: 1.4 (ref 1.2–2.2)
Albumin: 3.9 g/dL (ref 3.8–4.9)
Alkaline Phosphatase: 232 IU/L — ABNORMAL HIGH (ref 44–121)
BUN/Creatinine Ratio: 14 (ref 9–20)
BUN: 19 mg/dL (ref 6–24)
Bilirubin Total: <0.2 mg/dL (ref 0.0–1.2)
CO2: 20 mmol/L (ref 20–29)
Calcium: 9.2 mg/dL (ref 8.7–10.2)
Chloride: 107 mmol/L — ABNORMAL HIGH (ref 96–106)
Creatinine, Ser: 1.34 mg/dL — ABNORMAL HIGH (ref 0.76–1.27)
Globulin, Total: 2.7 g/dL (ref 1.5–4.5)
Glucose: 100 mg/dL — ABNORMAL HIGH (ref 70–99)
Potassium: 3.9 mmol/L (ref 3.5–5.2)
Sodium: 142 mmol/L (ref 134–144)
Total Protein: 6.6 g/dL (ref 6.0–8.5)
eGFR: 62 mL/min/{1.73_m2} (ref >59)

## 2022-10-23 LAB — CBC
Hematocrit: 32 % — ABNORMAL LOW (ref 37.5–51.0)
Hemoglobin: 10.8 g/dL — ABNORMAL LOW (ref 13.0–17.7)
MCH: 27.6 pg (ref 26.6–33.0)
MCHC: 33.8 g/dL (ref 31.5–35.7)
MCV: 82 fL (ref 79–97)
Platelets: 275 10*3/uL (ref 150–450)
RBC: 3.91 x10E6/uL — ABNORMAL LOW (ref 4.14–5.80)
RDW: 14 % (ref 11.6–15.4)
WBC: 3.9 10*3/uL (ref 3.4–10.8)

## 2022-10-23 LAB — LIPID PANEL
Chol/HDL Ratio: 3.7 ratio (ref 0.0–5.0)
Cholesterol, Total: 193 mg/dL (ref 100–199)
HDL: 52 mg/dL (ref >39)
LDL Chol Calc (NIH): 117 mg/dL — ABNORMAL HIGH (ref 0–99)
Triglycerides: 136 mg/dL (ref 0–149)
VLDL Cholesterol Cal: 24 mg/dL (ref 5–40)

## 2022-10-23 LAB — TSH: TSH: 2.07 u[IU]/mL (ref 0.450–4.500)

## 2022-10-24 ENCOUNTER — Other Ambulatory Visit: Payer: Self-pay

## 2022-10-24 ENCOUNTER — Other Ambulatory Visit (HOSPITAL_COMMUNITY): Payer: Self-pay

## 2022-11-04 ENCOUNTER — Other Ambulatory Visit: Payer: Self-pay

## 2022-11-04 ENCOUNTER — Encounter (HOSPITAL_COMMUNITY): Payer: Self-pay

## 2022-11-04 ENCOUNTER — Emergency Department (HOSPITAL_COMMUNITY): Payer: Medicaid Other

## 2022-11-04 ENCOUNTER — Emergency Department (HOSPITAL_COMMUNITY)
Admission: EM | Admit: 2022-11-04 | Discharge: 2022-11-04 | Discharge status: Against Medical Advice | Payer: Medicaid Other | Attending: Emergency Medicine | Admitting: Emergency Medicine

## 2022-11-04 DIAGNOSIS — X501XXA Overexertion from prolonged static or awkward postures, initial encounter: Secondary | ICD-10-CM | POA: Insufficient documentation

## 2022-11-04 DIAGNOSIS — R1011 Right upper quadrant pain: Secondary | ICD-10-CM | POA: Diagnosis not present

## 2022-11-04 DIAGNOSIS — Z5321 Procedure and treatment not carried out due to patient leaving prior to being seen by health care provider: Secondary | ICD-10-CM | POA: Diagnosis not present

## 2022-11-04 DIAGNOSIS — Y990 Civilian activity done for income or pay: Secondary | ICD-10-CM | POA: Insufficient documentation

## 2022-11-04 DIAGNOSIS — S299XXA Unspecified injury of thorax, initial encounter: Secondary | ICD-10-CM | POA: Insufficient documentation

## 2022-11-04 NOTE — ED Triage Notes (Signed)
Pt was working on his car when he felt something pop on the rt upper abdomen area. Worried about a cracked rib

## 2022-11-05 ENCOUNTER — Other Ambulatory Visit: Payer: Self-pay

## 2022-11-07 ENCOUNTER — Other Ambulatory Visit: Payer: Self-pay

## 2022-11-11 ENCOUNTER — Other Ambulatory Visit: Payer: Self-pay

## 2022-11-12 ENCOUNTER — Other Ambulatory Visit (HOSPITAL_COMMUNITY): Payer: Self-pay

## 2022-11-14 ENCOUNTER — Other Ambulatory Visit: Payer: Self-pay

## 2022-11-17 ENCOUNTER — Ambulatory Visit: Payer: Medicaid Other | Admitting: Nurse Practitioner

## 2022-11-17 ENCOUNTER — Other Ambulatory Visit: Payer: Self-pay | Admitting: Nurse Practitioner

## 2022-11-17 ENCOUNTER — Encounter: Payer: Self-pay | Admitting: Nurse Practitioner

## 2022-11-17 ENCOUNTER — Ambulatory Visit (HOSPITAL_COMMUNITY)
Admission: RE | Admit: 2022-11-17 | Discharge: 2022-11-17 | Disposition: A | Discharge status: Home / Self Care | Payer: Medicaid Other | Source: Ambulatory Visit | Attending: Nurse Practitioner | Admitting: Nurse Practitioner

## 2022-11-17 VITALS — BP 121/83 | HR 74 | Temp 97.6°F | Ht 71.0 in | Wt 253.0 lb

## 2022-11-17 DIAGNOSIS — J189 Pneumonia, unspecified organism: Secondary | ICD-10-CM

## 2022-11-17 NOTE — Patient Instructions (Signed)
1. Pneumonia of left lower lobe due to infectious organism  - DG Chest 2 View   Follow up:  Follow up as scheduled

## 2022-11-17 NOTE — Assessment & Plan Note (Signed)
-   DG Chest 2 View   Follow up:  Follow up as scheduled

## 2022-11-17 NOTE — Progress Notes (Signed)
$'@Patient't$  ID: Craig Collins, male    DOB: [birth year], 56 y.o.   MRN: [id number]  Chief Complaint  Patient presents with   Follow-up    Pt stated--follow up 3 weeks for the left lungs-much better.  but still having left shoulder pain .    Referring provider: [name], NP   HPI  Craig Collins presents for follow up.He . has a past medical history of Benign prostate hyperplasia, Elevated PSA, Erectile dysfunction, Hypertension, Obesity (BMI 35.0-39.9 without comorbidity), Prostate cancer ([place]) (05/2019), Sleep apnea, and Urinary retention.    Patient presents today for follow-up on recent pneumonia.  He states that overall he has been doing well.  He states that he is much improved.  We will recheck repeat chest x-ray today.  Patient has completed Augmentin and azithromycin.  Patient does have a history of prostate cancer and follows with oncology in December. Denies f/c/s, n/v/d, hemoptysis, PND, leg swelling Denies chest pain or edema       No Known Allergies  Immunization History  Administered Date(s) Administered   Influenza,inj,Quad PF,6+ Mos 10/08/2020   PFIZER(Purple Top)SARS-COV-2 Vaccination 03/06/2020, 04/19/2020, 11/13/2020    Past Medical History:  Diagnosis Date   Benign prostate hyperplasia    Elevated PSA    Erectile dysfunction    Hypertension    Obesity (BMI 35.0-39.9 without comorbidity)    Prostate cancer ([place]) 05/2019   Sleep apnea    Urinary retention     Tobacco History: Social History   Tobacco Use  Smoking Status Never  Smokeless Tobacco Never   Counseling given: Not Answered   Outpatient Encounter Medications as of 11/17/2022  Medication Sig   aspirin EC 81 MG tablet Take 81 mg by mouth daily.   Calcium Carbonate-Vitamin D (CALCIUM 500/D) 500-125 MG-UNIT TABS Take 1 tablet by mouth daily.   cyclobenzaprine (FLEXERIL) 10 MG tablet Take 1 tablet (10 mg total) by mouth 3 (three) times daily as needed for muscle spasms.    enzalutamide (XTANDI) 40 MG capsule Take 4 capsules (160 mg total) by mouth daily.   hydrochlorothiazide (HYDRODIURIL) 25 MG tablet TAKE 1/2 TABLET (12.5 MG TOTAL) BY MOUTH DAILY.   ibuprofen (ADVIL) 800 MG tablet TAKE 1 TABLET (800 MG TOTAL) BY MOUTH EVERY 8 (EIGHT) HOURS AS NEEDED. (Patient taking differently: Take 800 mg by mouth every 8 (eight) hours as needed for moderate pain.)   lidocaine (XYLOCAINE) 2 % solution Rinse gargle as directed 15 mLs in the mouth or throat every 4 (four) hours as needed (esophagitis discomfort).   lisinopril (ZESTRIL) 20 MG tablet TAKE 1 TABLET (20 MG TOTAL) BY MOUTH DAILY.   nystatin (MYCOSTATIN) 100000 UNIT/ML suspension Take 5 mLs by mouth 4 times daily.   tamsulosin (FLOMAX) 0.4 MG CAPS capsule TAKE 1 CAPSULE (0.4 MG TOTAL) BY MOUTH DAILY.   VIAGRA 25 MG tablet Take 1 tablet (25 mg total) by mouth daily as needed for erectile dysfunction.   vitamin B-12 1000 MCG tablet Take 1 tablet (1,000 mcg total) by mouth daily.   XTANDI 40 MG tablet Take 4 tablets ('160mg'$ ) by mouth once daily as directed by physician.   pantoprazole (PROTONIX) 40 MG tablet Take 1 tablet (40 mg total) by mouth daily. (Patient not taking: Reported on 10/22/2022)   sucralfate (CARAFATE) 1 GM/10ML suspension Take 10 mLs (1 g total) by mouth 4 (four) times daily -  with meals and at bedtime. (Patient not taking: Reported on 09/04/2022)   [DISCONTINUED] LORazepam (ATIVAN) 0.5  MG tablet Take 1 tablet by mouth 30 minutes prior to radiation for claustrophobia. (Patient not taking: Reported on 09/04/2022)   [DISCONTINUED] nitroGLYCERIN (NITROSTAT) 0.4 MG SL tablet Place 1 tablet under the tongue every 5 minutes as needed for chest pain (pain with swallowing food).  **NOT USING FOR CARDIAC CHEST PAIN** (Patient not taking: Reported on 09/04/2022)   No facility-administered encounter medications on file as of 11/17/2022.     Review of Systems  Review of Systems  Constitutional: Negative.   HENT:  Negative.    Cardiovascular: Negative.   Gastrointestinal: Negative.   Allergic/Immunologic: Negative.   Neurological: Negative.   Psychiatric/Behavioral: Negative.         Physical Exam  BP 121/83   Pulse 74   Temp 97.6 F (36.4 C)   Ht '5\' 11"'$  (1.803 m)   Wt 253 lb (114.8 kg)   SpO2 96%   BMI 35.29 kg/m   Wt Readings from Last 5 Encounters:  11/17/22 253 lb (114.8 kg)  11/04/22 250 lb (113.4 kg)  10/22/22 267 lb (121.1 kg)  10/16/22 252 lb 9.6 oz (114.6 kg)  10/11/22 240 lb (108.9 kg)     Physical Exam Vitals and nursing note reviewed.  Constitutional:      General: He is not in acute distress.    Appearance: He is well-developed.  Cardiovascular:     Rate and Rhythm: Normal rate and regular rhythm.  Pulmonary:     Effort: Pulmonary effort is normal.     Breath sounds: Normal breath sounds.  Skin:    General: Skin is warm and dry.  Neurological:     Mental Status: He is alert and oriented to person, place, and time.      Lab Results:  CBC    Component Value Date/Time   WBC 3.9 10/22/2022 1548   WBC 4.6 10/16/2022 0840   WBC 3.6 (L) 10/11/2022 1010   RBC 3.91 (L) 10/22/2022 1548   RBC 4.06 (L) 10/16/2022 0840   HGB 10.8 (L) 10/22/2022 1548   HCT 32.0 (L) 10/22/2022 1548   PLT 275 10/22/2022 1548   MCV 82 10/22/2022 1548   MCH 27.6 10/22/2022 1548   MCH 27.6 10/16/2022 0840   MCHC 33.8 10/22/2022 1548   MCHC 33.2 10/16/2022 0840   RDW 14.0 10/22/2022 1548   LYMPHSABS 0.7 10/16/2022 0840   LYMPHSABS 2.0 06/18/2020 0921   MONOABS 0.4 10/16/2022 0840   EOSABS 0.1 10/16/2022 0840   EOSABS 0.2 06/18/2020 0921   BASOSABS 0.0 10/16/2022 0840   BASOSABS 0.0 06/18/2020 0921    BMET    Component Value Date/Time   NA 142 10/22/2022 1548   K 3.9 10/22/2022 1548   CL 107 (H) 10/22/2022 1548   CO2 20 10/22/2022 1548   GLUCOSE 100 (H) 10/22/2022 1548   GLUCOSE 128 (H) 10/16/2022 0840   BUN 19 10/22/2022 1548   CREATININE 1.34 (H) 10/22/2022 1548    CREATININE 1.35 (H) 10/16/2022 0840   CREATININE 1.39 (H) 10/28/2017 0958   CALCIUM 9.2 10/22/2022 1548   GFRNONAA >60 10/16/2022 0840   GFRAA 65 06/18/2020 0921      Assessment & Plan:   Pneumonia of left lower lobe due to infectious organism - DG Chest 2 View   Follow up:  Follow up as scheduled     [name], NP 11/17/2022

## 2022-11-20 ENCOUNTER — Telehealth: Payer: Self-pay | Admitting: Oncology

## 2022-11-20 NOTE — Progress Notes (Signed)
Synopsis: Referred for LLL pneumonia by [name], NP  Subjective:   PATIENT ID: Craig Collins GENDER: male DOB: [birth year], MRN: [id number]  Chief Complaint  Patient presents with   Consult    Follow up with CXR 11/17/22.  CTA 10/11/2022.  Only sx was woke up on 10/11/22 with left arm and chest pain.   [id number] with history of BPH, ED, obesity, advanced prostate ca with LAD noted 06/2022 originally diagnosed 2020 and currently on xtandi, OSA  Went to ED for aching CP 10/21 and had CTA Chest with L sided nodular consolidation - given z pack and augmentin. CXR ordered at follow up visit 11/27 with interval worsening.  Regarding his prostate CA he has had XRT to left chest wall, left supraclavicular nodal region 07/16/22-07/29/22.   He has every now and then cough, worse when eating. He has no trouble swallowing now but did after radiation initially. He doesn't have any dyspnea or any significant DOE. No fever. Hot flashes which he attributes to xtandi.   Every now and then has postnasal drainage.  Otherwise pertinent review of systems is negative.  He has no family history of lung disease  He is not working currently. 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 He never smoked. No recent travel.    Past Medical History:  Diagnosis Date   Benign prostate hyperplasia    Elevated PSA    Erectile dysfunction    Hypertension    Obesity (BMI 35.0-39.9 without comorbidity)    Prostate cancer ([place]) 05/2019   Sleep apnea    Urinary retention      Family History  Problem Relation Age of Onset   Leukemia Mother 45 - 68   Hypertension Brother    Prostate cancer Maternal Uncle    Prostate cancer Maternal Grandfather    Hypertension Other    Prostate cancer Cousin        maternal first cousin   Colon cancer Neg Hx      Past Surgical History:  Procedure Laterality Date   COLONOSCOPY N/A 10/14/2017   Procedure: COLONOSCOPY;   Surgeon: Gatha Mayer, MD;  Location: [facility] END[facility];  Service: Endoscopy;  Laterality: N/A;   ESOPHAGOGASTRODUODENOSCOPY (EGD) WITH PROPOFOL N/A 08/04/2022   Procedure: ESOPHAGOGASTRODUODENOSCOPY (EGD) WITH PROPOFOL;  Surgeon: Juanita Craver, MD;  Location: [facility] ENDOSCOPY;  Service: Gastroenterology;  Laterality: N/A;    Social History   Socioeconomic History   Marital status: Married    Spouse name: Not on file   Number of children: Not on file   Years of education: Not on file   Highest education level: Not on file  Occupational History   Not on file  Tobacco Use   Smoking status: Never   Smokeless tobacco: Never  Vaping Use   Vaping Use: Never used  Substance and Sexual Activity   Alcohol use: Not Currently   Drug use: No   Sexual activity: Yes    Birth control/protection: Condom  Other Topics Concern   Not on file  Social History Narrative   Not on file   Social Determinants of Health   Financial Resource Strain: Not on file  Food Insecurity: Not on file  Transportation Needs: Not on file  Physical Activity: Not on file  Stress: Not on file  Social Connections: Not on file  Intimate Partner Violence: Not on file     No Known Allergies   Outpatient Medications Prior to Visit  Medication Sig Dispense Refill   aspirin EC 81 MG tablet Take 81 mg by mouth daily.     Calcium Carbonate-Vitamin D (CALCIUM 500/D) 500-125 MG-UNIT TABS Take 1 tablet by mouth daily.     cyclobenzaprine (FLEXERIL) 10 MG tablet Take 1 tablet (10 mg total) by mouth 3 (three) times daily as needed for muscle spasms. 60 tablet 3   hydrochlorothiazide (HYDRODIURIL) 25 MG tablet TAKE 1/2 TABLET (12.5 MG TOTAL) BY MOUTH DAILY. 45 tablet 3   ibuprofen (ADVIL) 800 MG tablet TAKE 1 TABLET (800 MG TOTAL) BY MOUTH EVERY 8 (EIGHT) HOURS AS NEEDED. (Patient taking differently: Take 800 mg by mouth every 8 (eight) hours as needed for moderate pain.) 60 tablet 3   lisinopril (ZESTRIL) 20 MG tablet TAKE 1 TABLET  (20 MG TOTAL) BY MOUTH DAILY. 90 tablet 3   nystatin (MYCOSTATIN) 100000 UNIT/ML suspension Take 5 mLs by mouth 4 times daily. 60 mL 0   tamsulosin (FLOMAX) 0.4 MG CAPS capsule TAKE 1 CAPSULE (0.4 MG TOTAL) BY MOUTH DAILY. 30 capsule 11   VIAGRA 25 MG tablet Take 1 tablet (25 mg total) by mouth daily as needed for erectile dysfunction. 10 tablet 0   vitamin B-12 1000 MCG tablet Take 1 tablet (1,000 mcg total) by mouth daily. 30 tablet 0   XTANDI 40 MG tablet Take 4 tablets ('160mg'$ ) by mouth once daily as directed by physician. 120 tablet 0   enzalutamide (XTANDI) 40 MG capsule Take 4 capsules (160 mg total) by mouth daily. (Patient not taking: Reported on 11/21/2022) 120 capsule 1   lidocaine (XYLOCAINE) 2 % solution Rinse gargle as directed 15 mLs in the mouth or throat every 4 (four) hours as needed (esophagitis discomfort). (Patient not taking: Reported on 11/21/2022) 100 mL 1   pantoprazole (PROTONIX) 40 MG tablet Take 1 tablet (40 mg total) by mouth daily. (Patient not taking: Reported on 10/22/2022) 30 tablet 2   sucralfate (CARAFATE) 1 GM/10ML suspension Take 10 mLs (1 g total) by mouth 4 (four) times daily -  with meals and at bedtime. (Patient not taking: Reported on 09/04/2022) 1200 mL 2   No facility-administered medications prior to visit.       Objective:   Physical Exam:  General appearance: 56 y.o., male, NAD, conversant  Eyes: anicteric sclerae; PERRL, tracking appropriately HENT: NCAT; MMM Neck: Trachea midline; no lymphadenopathy, no JVD Lungs: CTAB, no crackles, no wheeze, with normal respiratory effort CV: RRR, no murmur  Abdomen: Soft, non-tender; non-distended, BS present  Extremities: No peripheral edema, warm Skin: Normal turgor and texture; no rash Psych: Appropriate affect Neuro: Alert and oriented to person and place, no focal deficit     Vitals:   11/21/22 1420  BP: 120/78  Pulse: (!) 114  Temp: 98 F (36.7 C)  TempSrc: Oral  SpO2: 100%  Weight: 253  lb 3.2 oz (114.9 kg)  Height: '5\' 11"'$  (1.803 m)   100% on RA BMI Readings from Last 3 Encounters:  11/21/22 35.31 kg/m  11/17/22 35.29 kg/m  11/04/22 34.87 kg/m   Wt Readings from Last 3 Encounters:  11/21/22 253 lb 3.2 oz (114.9 kg)  11/17/22 253 lb (114.8 kg)  11/04/22 250 lb (113.4 kg)     CBC    Component Value Date/Time   WBC 3.9 10/22/2022 1548   WBC 4.6 10/16/2022 0840   WBC 3.6 (L) 10/11/2022 1010   RBC 3.91 (L) 10/22/2022 1548   RBC 4.06 (L) 10/16/2022 0840   HGB 10.8 (L)  10/22/2022 1548   HCT 32.0 (L) 10/22/2022 1548   PLT 275 10/22/2022 1548   MCV 82 10/22/2022 1548   MCH 27.6 10/22/2022 1548   MCH 27.6 10/16/2022 0840   MCHC 33.8 10/22/2022 1548   MCHC 33.2 10/16/2022 0840   RDW 14.0 10/22/2022 1548   LYMPHSABS 0.7 10/16/2022 0840   LYMPHSABS 2.0 06/18/2020 0921   MONOABS 0.4 10/16/2022 0840   EOSABS 0.1 10/16/2022 0840   EOSABS 0.2 06/18/2020 0921   BASOSABS 0.0 10/16/2022 0840   BASOSABS 0.0 06/18/2020 0921    Chest Imaging: CTA Chest 10/11/22 with L sided multifocal nodular consolidation  CXR 11/17/22 with persistent LUL and progressive L perihilar alveolar opacities  Pulmonary Functions Testing Results:     No data to display             Assessment & Plan:   # Pneumonitis Considerations include radiation pneumonitis given distribution/timing which may be in organizing pneumonia pattern, primary or secondary organizing pneumonia from something else, lower suspicion for even indolent infectious process given paucity of symptoms.   Plan: - CTD serologies - CT Chest - If remains concerning for radiation pneumonitis or other organizing pneumonia we discussed expectant management given paucity of symptoms but with extent of radiographic progression Craig Collins and I are inclined to treat with steroid taper over a period of 6 weeks or so. Will send message as well to Dr. Tammi Klippel and Freeman Caldron.     Maryjane Hurter, MD [place]  [facility] 11/21/2022 2:31 PM

## 2022-11-20 NOTE — Telephone Encounter (Signed)
Called patient regarding upcoming appointments, patient is notified. 

## 2022-11-21 ENCOUNTER — Encounter: Payer: Self-pay | Admitting: Student

## 2022-11-21 ENCOUNTER — Other Ambulatory Visit: Payer: Self-pay | Admitting: Oncology

## 2022-11-21 ENCOUNTER — Ambulatory Visit (INDEPENDENT_AMBULATORY_CARE_PROVIDER_SITE_OTHER): Payer: Medicaid Other | Admitting: Student

## 2022-11-21 VITALS — BP 120/78 | HR 114 | Temp 98.0°F | Ht 71.0 in | Wt 253.2 lb

## 2022-11-21 DIAGNOSIS — J984 Other disorders of lung: Secondary | ICD-10-CM | POA: Diagnosis not present

## 2022-11-21 NOTE — Patient Instructions (Addendum)
-   labs today, PFTs ordered - we will schedule CT Chest ASAP please call our clinic [phone] to let me know scan has been done and I will review it and likely plan to start steroid course afterward as long as it doesn't look like anything else fishy is going on - I'll give a heads up as well to radiation oncology

## 2022-11-22 ENCOUNTER — Ambulatory Visit (HOSPITAL_COMMUNITY)
Admission: RE | Admit: 2022-11-22 | Discharge: 2022-11-22 | Disposition: A | Discharge status: Home / Self Care | Payer: Medicaid Other | Source: Ambulatory Visit | Attending: Student | Admitting: Student

## 2022-11-22 DIAGNOSIS — J984 Other disorders of lung: Secondary | ICD-10-CM | POA: Diagnosis not present

## 2022-11-22 DIAGNOSIS — J90 Pleural effusion, not elsewhere classified: Secondary | ICD-10-CM | POA: Diagnosis not present

## 2022-11-25 ENCOUNTER — Telehealth: Payer: Self-pay | Admitting: Student

## 2022-11-25 DIAGNOSIS — J984 Other disorders of lung: Secondary | ICD-10-CM

## 2022-11-25 LAB — ANA W/REFLEX: ANA Titer 1: NEGATIVE

## 2022-11-25 NOTE — Telephone Encounter (Signed)
Called and discussed CT Chest result reflecting evolution of likely radiation pneumonitis. Still has no clearly related symptoms. Will hold off on steroids. Tentative plan on 3 month follow up CT Chest and clinic visit. Instructed to reach out to our clinic or Dr. Johny Shears clinic if onset of persistent cough, dyspnea, constitutional symptoms.

## 2022-11-26 ENCOUNTER — Other Ambulatory Visit: Payer: Self-pay

## 2022-11-26 LAB — CBC WITH DIFFERENTIAL/PLATELET
Absolute Monocytes: 371 cells/uL (ref 200–950)
Basophils Absolute: 12 cells/uL (ref 0–200)
Basophils Relative: 0.3 %
Eosinophils Absolute: 101 cells/uL (ref 15–500)
Eosinophils Relative: 2.6 %
HCT: 34.2 % — ABNORMAL LOW (ref 38.5–50.0)
Hemoglobin: 11.6 g/dL — ABNORMAL LOW (ref 13.2–17.1)
Lymphs Abs: 815 cells/uL — ABNORMAL LOW (ref 850–3900)
MCH: 28.4 pg (ref 27.0–33.0)
MCHC: 33.9 g/dL (ref 32.0–36.0)
MCV: 83.8 fL (ref 80.0–100.0)
MPV: 9 fL (ref 7.5–12.5)
Monocytes Relative: 9.5 %
Neutro Abs: 2601 cells/uL (ref 1500–7800)
Neutrophils Relative %: 66.7 %
Platelets: 292 10*3/uL (ref 140–400)
RBC: 4.08 10*6/uL — ABNORMAL LOW (ref 4.20–5.80)
RDW: 14.1 % (ref 11.0–15.0)
Total Lymphocyte: 20.9 %
WBC: 3.9 10*3/uL (ref 3.8–10.8)

## 2022-11-26 LAB — CYCLIC CITRUL PEPTIDE ANTIBODY, IGG: Cyclic Citrullin Peptide Ab: <16 UNITS

## 2022-11-26 LAB — ANCA SCREEN W REFLEX TITER: ANCA SCREEN: NEGATIVE

## 2022-11-26 LAB — CK: Total CK: 56 U/L (ref 44–196)

## 2022-11-26 LAB — RHEUMATOID FACTOR: Rheumatoid fact SerPl-aCnc: <14 IU/mL (ref <14)

## 2022-11-28 ENCOUNTER — Other Ambulatory Visit: Payer: Self-pay

## 2022-12-03 ENCOUNTER — Other Ambulatory Visit (HOSPITAL_COMMUNITY): Payer: Self-pay

## 2022-12-10 DIAGNOSIS — C778 Secondary and unspecified malignant neoplasm of lymph nodes of multiple regions: Secondary | ICD-10-CM | POA: Diagnosis not present

## 2022-12-10 DIAGNOSIS — C7951 Secondary malignant neoplasm of bone: Secondary | ICD-10-CM | POA: Diagnosis not present

## 2022-12-10 DIAGNOSIS — N522 Drug-induced erectile dysfunction: Secondary | ICD-10-CM | POA: Diagnosis not present

## 2022-12-10 DIAGNOSIS — C61 Malignant neoplasm of prostate: Secondary | ICD-10-CM | POA: Diagnosis not present

## 2022-12-12 ENCOUNTER — Ambulatory Visit (INDEPENDENT_AMBULATORY_CARE_PROVIDER_SITE_OTHER): Payer: Medicaid Other | Admitting: Student

## 2022-12-12 DIAGNOSIS — J984 Other disorders of lung: Secondary | ICD-10-CM

## 2022-12-12 LAB — PULMONARY FUNCTION TEST
DL/VA % pred: 124 %
DL/VA: 5.35 ml/min/mmHg/L
DLCO cor % pred: 77 %
DLCO cor: 21.99 ml/min/mmHg
DLCO unc % pred: 70 %
DLCO unc: 19.88 ml/min/mmHg
FEF 25-75 Post: 3.31 L/sec
FEF 25-75 Pre: 1.98 L/sec
FEF2575-%Change-Post: 67 %
FEF2575-%Pred-Post: 104 %
FEF2575-%Pred-Pre: 62 %
FEV1-%Change-Post: 13 %
FEV1-%Pred-Post: 68 %
FEV1-%Pred-Pre: 59 %
FEV1-Post: 2.55 L
FEV1-Pre: 2.24 L
FEV1FVC-%Change-Post: 11 %
FEV1FVC-%Pred-Pre: 101 %
FEV6-%Change-Post: 2 %
FEV6-%Pred-Post: 62 %
FEV6-%Pred-Pre: 61 %
FEV6-Post: 2.94 L
FEV6-Pre: 2.88 L
FEV6FVC-%Pred-Post: 104 %
FEV6FVC-%Pred-Pre: 104 %
FVC-%Change-Post: 2 %
FVC-%Pred-Post: 60 %
FVC-%Pred-Pre: 58 %
FVC-Post: 2.94 L
FVC-Pre: 2.88 L
Post FEV1/FVC ratio: 87 %
Post FEV6/FVC ratio: 100 %
Pre FEV1/FVC ratio: 78 %
Pre FEV6/FVC Ratio: 100 %
RV % pred: 84 %
RV: 1.83 L
TLC % pred: 65 %
TLC: 4.55 L

## 2022-12-12 NOTE — Progress Notes (Signed)
PFT done today. 

## 2022-12-16 ENCOUNTER — Inpatient Hospital Stay: Payer: Medicaid Other | Attending: Oncology

## 2022-12-16 ENCOUNTER — Inpatient Hospital Stay (HOSPITAL_BASED_OUTPATIENT_CLINIC_OR_DEPARTMENT_OTHER): Payer: Medicaid Other | Admitting: Oncology

## 2022-12-16 VITALS — BP 137/80 | HR 77 | Temp 97.2°F | Resp 15 | Wt 256.2 lb

## 2022-12-16 DIAGNOSIS — C61 Malignant neoplasm of prostate: Secondary | ICD-10-CM | POA: Insufficient documentation

## 2022-12-16 DIAGNOSIS — C770 Secondary and unspecified malignant neoplasm of lymph nodes of head, face and neck: Secondary | ICD-10-CM | POA: Insufficient documentation

## 2022-12-16 DIAGNOSIS — C7951 Secondary malignant neoplasm of bone: Secondary | ICD-10-CM

## 2022-12-16 DIAGNOSIS — C7989 Secondary malignant neoplasm of other specified sites: Secondary | ICD-10-CM | POA: Diagnosis not present

## 2022-12-16 LAB — CMP (CANCER CENTER ONLY)
ALT: 8 U/L (ref 0–44)
AST: 10 U/L — ABNORMAL LOW (ref 15–41)
Albumin: 4 g/dL (ref 3.5–5.0)
Alkaline Phosphatase: 158 U/L — ABNORMAL HIGH (ref 38–126)
Anion gap: 8 (ref 5–15)
BUN: 23 mg/dL — ABNORMAL HIGH (ref 6–20)
CO2: 26 mmol/L (ref 22–32)
Calcium: 10 mg/dL (ref 8.9–10.3)
Chloride: 107 mmol/L (ref 98–111)
Creatinine: 1.4 mg/dL — ABNORMAL HIGH (ref 0.61–1.24)
GFR, Estimated: 59 mL/min — ABNORMAL LOW (ref >60)
Glucose, Bld: 98 mg/dL (ref 70–99)
Potassium: 3.9 mmol/L (ref 3.5–5.1)
Sodium: 141 mmol/L (ref 135–145)
Total Bilirubin: 0.3 mg/dL (ref 0.3–1.2)
Total Protein: 7.5 g/dL (ref 6.5–8.1)

## 2022-12-16 LAB — CBC WITH DIFFERENTIAL (CANCER CENTER ONLY)
Abs Immature Granulocytes: 0.01 10*3/uL (ref 0.00–0.07)
Basophils Absolute: 0 10*3/uL (ref 0.0–0.1)
Basophils Relative: 1 %
Eosinophils Absolute: 0.3 10*3/uL (ref 0.0–0.5)
Eosinophils Relative: 10 %
HCT: 32.9 % — ABNORMAL LOW (ref 39.0–52.0)
Hemoglobin: 10.8 g/dL — ABNORMAL LOW (ref 13.0–17.0)
Immature Granulocytes: 0 %
Lymphocytes Relative: 28 %
Lymphs Abs: 1 10*3/uL (ref 0.7–4.0)
MCH: 28.5 pg (ref 26.0–34.0)
MCHC: 32.8 g/dL (ref 30.0–36.0)
MCV: 86.8 fL (ref 80.0–100.0)
Monocytes Absolute: 0.4 10*3/uL (ref 0.1–1.0)
Monocytes Relative: 13 %
Neutro Abs: 1.6 10*3/uL — ABNORMAL LOW (ref 1.7–7.7)
Neutrophils Relative %: 48 %
Platelet Count: 246 10*3/uL (ref 150–400)
RBC: 3.79 MIL/uL — ABNORMAL LOW (ref 4.22–5.81)
RDW: 14 % (ref 11.5–15.5)
WBC Count: 3.4 10*3/uL — ABNORMAL LOW (ref 4.0–10.5)
nRBC: 0 % (ref 0.0–0.2)

## 2022-12-16 NOTE — Progress Notes (Signed)
Hematology and Oncology Follow Up Visit  Craig Collins [id number] Oct 07, 1966 56 y.o. 12/16/2022 8:08 AM [name], NPNichols, Kriste Basque, NP   Principle Diagnosis: 56 year old man with castration-resistant advanced prostate cancer with lymphadenopathy documented in July 2023.  He initially presented with Gleason score of 9 and PSA 42 and advanced disease.   Prior Therapy: He was started on androgen deprivation therapy initially under the care of Dr. Karsten Ro. His PSA dropped to 2.77 in November 2021.  PSA started to rise and February 2022 up to 5.87.  In November 2022 his PSA was 99.3 and testosterone level of 19.3.    He has failed to follow-up periodically and to reestablish care with Dr. Junious Silk. PSA on May 28, 2022 was 22.1 and PSMA PET scan obtained on June 10, 2022 showed intense uptake within the prostate gland and extension into the seminal vesicle.  Bulky metastatic disease in the pelvis, periaortic and retroperitoneal lymphadenopathy.  Palliative radiation therapy to the left chest wall and left supraclavicular nodal region completed 2023.  He completed 30 Gray in 10 fractions.  Current therapy: Xtandi 160 mg daily started on July 15, 2022.  Interim History: Mr. Bouch returns today for repeat follow-up.  Since last visit, he reports no major changes in his health.  He denies any nausea, vomiting or abdominal pain.  He has reported some occasional nonproductive cough and dyspnea.  He has been diagnosed with radiation pneumonitis which appears to be clinically improving.  He denies any bone pain or pathological fractures.     Medications: Reviewed without changes. Current Outpatient Medications  Medication Sig Dispense Refill   aspirin EC 81 MG tablet Take 81 mg by mouth daily.     Calcium Carbonate-Vitamin D (CALCIUM 500/D) 500-125 MG-UNIT TABS Take 1 tablet by mouth daily.     cyclobenzaprine (FLEXERIL) 10 MG tablet Take 1 tablet (10 mg total) by mouth 3 (three) times  daily as needed for muscle spasms. 60 tablet 3   enzalutamide (XTANDI) 40 MG capsule Take 4 capsules (160 mg total) by mouth daily. (Patient not taking: Reported on 11/21/2022) 120 capsule 1   hydrochlorothiazide (HYDRODIURIL) 25 MG tablet TAKE 1/2 TABLET (12.5 MG TOTAL) BY MOUTH DAILY. 45 tablet 3   ibuprofen (ADVIL) 800 MG tablet TAKE 1 TABLET (800 MG TOTAL) BY MOUTH EVERY 8 (EIGHT) HOURS AS NEEDED. (Patient taking differently: Take 800 mg by mouth every 8 (eight) hours as needed for moderate pain.) 60 tablet 3   lidocaine (XYLOCAINE) 2 % solution Rinse gargle as directed 15 mLs in the mouth or throat every 4 (four) hours as needed (esophagitis discomfort). (Patient not taking: Reported on 11/21/2022) 100 mL 1   lisinopril (ZESTRIL) 20 MG tablet TAKE 1 TABLET (20 MG TOTAL) BY MOUTH DAILY. 90 tablet 3   nystatin (MYCOSTATIN) 100000 UNIT/ML suspension Take 5 mLs by mouth 4 times daily. 60 mL 0   pantoprazole (PROTONIX) 40 MG tablet Take 1 tablet (40 mg total) by mouth daily. (Patient not taking: Reported on 10/22/2022) 30 tablet 2   sucralfate (CARAFATE) 1 GM/10ML suspension Take 10 mLs (1 g total) by mouth 4 (four) times daily -  with meals and at bedtime. (Patient not taking: Reported on 09/04/2022) 1200 mL 2   tamsulosin (FLOMAX) 0.4 MG CAPS capsule TAKE 1 CAPSULE (0.4 MG TOTAL) BY MOUTH DAILY. 30 capsule 11   VIAGRA 25 MG tablet Take 1 tablet (25 mg total) by mouth daily as needed for erectile dysfunction. 10 tablet 0  vitamin B-12 1000 MCG tablet Take 1 tablet (1,000 mcg total) by mouth daily. 30 tablet 0   XTANDI 40 MG tablet Take 4 tablets ('160mg'$ ) by mouth once daily as directed by physician. 120 tablet 0   No current facility-administered medications for this visit.     Allergies: No Known Allergies    Physical Exam: Blood pressure 137/80, pulse 77, temperature (!) 97.2 F (36.2 C), temperature source Temporal, resp. rate 15, weight 256 lb 3.2 oz (116.2 kg), SpO2 98 %.   ECOG:  1    General appearance: Comfortable appearing without any discomfort Head: Normocephalic without any trauma Oropharynx: Mucous membranes are moist and pink without any thrush or ulcers. Eyes: Pupils are equal and round reactive to light. Lymph nodes: No cervical, supraclavicular, inguinal or axillary lymphadenopathy.   Heart:regular rate and rhythm.  S1 and S2 without leg edema. Lung: Clear without any rhonchi or wheezes.  No dullness to percussion. Abdomin: Soft, nontender, nondistended with good bowel sounds.  No hepatosplenomegaly. Musculoskeletal: No joint deformity or effusion.  Full range of motion noted. Neurological: No deficits noted on motor, sensory and deep tendon reflex exam. Skin: No petechial rash or dryness.  Appeared moist.      Lab Results: Lab Results  Component Value Date   WBC 3.9 11/21/2022   HGB 11.6 (L) 11/21/2022   HCT 34.2 (L) 11/21/2022   MCV 83.8 11/21/2022   PLT 292 11/21/2022   PSA 17.0 (H) 10/28/2017     Chemistry      Component Value Date/Time   NA 142 10/22/2022 1548   K 3.9 10/22/2022 1548   CL 107 (H) 10/22/2022 1548   CO2 20 10/22/2022 1548   BUN 19 10/22/2022 1548   CREATININE 1.34 (H) 10/22/2022 1548   CREATININE 1.35 (H) 10/16/2022 0840   CREATININE 1.39 (H) 10/28/2017 0958      Component Value Date/Time   CALCIUM 9.2 10/22/2022 1548   ALKPHOS 232 (H) 10/22/2022 1548   AST 6 10/22/2022 1548   AST 10 (L) 10/16/2022 0840   ALT 7 10/22/2022 1548   ALT 9 10/16/2022 0840   BILITOT <0.2 10/22/2022 1548   BILITOT 0.3 10/16/2022 0840        Latest Reference Range & Units 08/19/22 13:38 09/11/22 11:22 10/16/22 08:40  Prostate Specific Ag, Serum 0.0 - 4.0 ng/mL 0.9 0.5 0.4     Impression and Plan:   56 year old with:  1.  Advanced prostate cancer with disease to the bone and lymphadenopathy diagnosed in July 2023.  He has castration-resistant disease at this time.  He is currently on Xtandi with excellent PSA response is  currently at 0.4.  Risks and benefits of continuing this treatment were discussed at this time.  Complications including hypertension, fatigue, urinary frequency and rarely seizures were reiterated.  He is agreeable to continue at this time.  Alternative treatment options such as PARP inhibitor, Taxotere chemotherapy among others could be considered if he has progression of disease in the future.   2.  Supraclavicular adenopathy and bone pain: Resolved after radiation therapy.  3.  Bone directed therapy: I recommended adding calcium and vitamin D and obtaining dental clearance.  Consideration for Xgeva at that time.   4.  Prognosis and goals of care: He is palliative although aggressive measures are warranted given his excellent performance status.  5.  Androgen deprivation therapy: He will receive that under the care of Dr. Junious Silk indefinitely.  His next injection will be in 6 months.  6.  Follow-up: After discussion today he opted to continue to follow with Dr. Junious Silk completely and return to oncology if he has progression of disease in the future.   30  minutes were dedicated to this encounter.  The time was spent on updating disease status, treatment choices and outlining future plan of care discussion.  Zola Button, MD 12/26/20238:08 AM

## 2022-12-23 ENCOUNTER — Other Ambulatory Visit: Payer: Self-pay

## 2022-12-25 LAB — PROSTATE-SPECIFIC AG, SERUM (LABCORP): Prostate Specific Ag, Serum: 0.3 ng/mL (ref 0.0–4.0)

## 2023-01-13 ENCOUNTER — Telehealth: Payer: Self-pay | Admitting: *Deleted

## 2023-01-13 ENCOUNTER — Other Ambulatory Visit: Payer: Self-pay | Admitting: Oncology

## 2023-01-13 NOTE — Telephone Encounter (Signed)
Patient advised that he would follow up with Dr Junious Silk for all further care (including oral chemo refills).

## 2023-01-13 NOTE — Telephone Encounter (Signed)
Refill for Xtandi denied due to last note from Dr Alen Blew stating that patient will follow up with Dr Junious Silk for all care.

## 2023-01-15 ENCOUNTER — Other Ambulatory Visit: Payer: Self-pay | Admitting: Oncology

## 2023-01-16 ENCOUNTER — Telehealth: Payer: Self-pay | Admitting: *Deleted

## 2023-01-16 NOTE — Telephone Encounter (Signed)
[facility] left a message requesting a refill of Xtandi. Dr Junious Silk at [facility] notified of refill need.

## 2023-01-19 ENCOUNTER — Encounter: Payer: Self-pay | Admitting: Nurse Practitioner

## 2023-01-19 ENCOUNTER — Other Ambulatory Visit: Payer: Self-pay

## 2023-01-19 ENCOUNTER — Ambulatory Visit: Payer: Medicaid Other | Admitting: Nurse Practitioner

## 2023-01-19 VITALS — BP 125/79 | HR 87 | Temp 97.1°F | Ht 68.5 in | Wt 258.2 lb

## 2023-01-19 DIAGNOSIS — J358 Other chronic diseases of tonsils and adenoids: Secondary | ICD-10-CM | POA: Diagnosis not present

## 2023-01-19 DIAGNOSIS — J019 Acute sinusitis, unspecified: Secondary | ICD-10-CM

## 2023-01-19 LAB — POC SOFIA 2 FLU + SARS ANTIGEN FIA
Influenza A, POC: NEGATIVE
Influenza B, POC: NEGATIVE
SARS Coronavirus 2 Ag: NEGATIVE

## 2023-01-19 LAB — POCT RAPID STREP A (OFFICE): Rapid Strep A Screen: NEGATIVE

## 2023-01-19 MED ORDER — FLUTICASONE PROPIONATE 50 MCG/ACT NA SUSP
2.0000 | Freq: Every day | NASAL | 6 refills | Status: DC
  Filled 2023-01-19: qty 16, 25d supply, fill #0
  Filled 2023-02-08 – 2023-02-17 (×2): qty 16, 25d supply, fill #1
  Filled 2023-03-26: qty 16, 25d supply, fill #2
  Filled 2023-04-18 – 2023-06-02 (×2): qty 16, 25d supply, fill #3
  Filled 2023-09-03: qty 16, 25d supply, fill #4
  Filled 2023-10-24: qty 16, 25d supply, fill #5
  Filled 2023-12-20: qty 16, 25d supply, fill #6

## 2023-01-19 MED ORDER — SALINE SPRAY 0.65 % NA SOLN
1.0000 | NASAL | 0 refills | Status: AC | PRN
  Filled 2023-01-19: qty 30, fill #0

## 2023-01-19 NOTE — Progress Notes (Signed)
Acute Office Visit  Subjective:     Patient ID: Craig Collins, male    DOB: Sep 04, 1966, 57 y.o.   MRN: [id number]  Chief Complaint  Patient presents with   Sinus Problem    X 4 days, congestion, drainage, had radiation to left lung and he doesn't want it to go to his chest. Denies fever, sore throat but a slight headache.     Sinus Problem Associated symptoms include congestion. Pertinent negatives include no chills, coughing, diaphoresis, ear pain, headaches, shortness of breath or sore throat.   Craig Collins with past medical history of hypertension, metastatic prostate cancer to bone, chest wall and left supraclavicular lymph node with past radiation treatments,radiation oesophagitis,  morbid obesity, radiation esophagitis diverticulosis of colon with hemorrhage , pneumonia  is in today for complaints of sinus pressure, sneezing, stuffy nose with clear nasal drainage, throat clearing, slight HA for the past 3 days.  He denies known sick contacts, fever, chills, shortness of breath, wheezing, bloody sputum, chest pain, sore throat, difficulty swallowing,  He has not taken any medication for his complaints .    Review of Systems  Constitutional:  Negative for chills, diaphoresis, fever, malaise/fatigue and weight loss.  HENT:  Positive for congestion. Negative for ear discharge, ear pain, nosebleeds, sore throat and tinnitus.   Respiratory:  Negative for cough, hemoptysis, sputum production, shortness of breath and wheezing.   Cardiovascular:  Negative for chest pain, palpitations, orthopnea and claudication.  Neurological:  Negative for dizziness, tingling, tremors, sensory change, speech change and headaches.  Psychiatric/Behavioral:  Negative for depression, hallucinations, substance abuse and suicidal ideas. The patient is not nervous/anxious.         Objective:    BP 125/79   Pulse 87   Temp (!) 97.1 F (36.2 C) (Temporal)   Ht 5' 8.5" (1.74 m)   Wt 258 lb 3.2 oz (117.1  kg)   SpO2 100%   BMI 38.69 kg/m    Physical Exam Constitutional:      General: He is not in acute distress.    Appearance: Normal appearance. He is obese. He is not ill-appearing, toxic-appearing or diaphoretic.  HENT:     Right Ear: Tympanic membrane, ear canal and external ear normal. There is no impacted cerumen.     Left Ear: Tympanic membrane, ear canal and external ear normal. There is no impacted cerumen.     Nose: Congestion and rhinorrhea present.     Mouth/Throat:     Mouth: Mucous membranes are moist.     Pharynx: Oropharynx is clear.     Tonsils: Tonsillar exudate present. 2+ on the right.  Eyes:     General: No scleral icterus.       Right eye: No discharge.        Left eye: No discharge.     Extraocular Movements: Extraocular movements intact.     Conjunctiva/sclera: Conjunctivae normal.  Neck:     Vascular: No carotid bruit.  Cardiovascular:     Rate and Rhythm: Normal rate and regular rhythm.     Pulses: Normal pulses.     Heart sounds: Normal heart sounds. No murmur heard.    No friction rub. No gallop.  Pulmonary:     Effort: No respiratory distress.     Breath sounds: No stridor. No wheezing, rhonchi or rales.     Comments: Diminished lung sounds bilaterally  Chest:     Chest wall: No tenderness.  Musculoskeletal:  General: Normal range of motion.     Cervical back: No rigidity or tenderness.  Lymphadenopathy:     Cervical: No cervical adenopathy.  Psychiatric:        Mood and Affect: Mood normal.        Behavior: Behavior normal.        Thought Content: Thought content normal.        Judgment: Judgment normal.     Results for orders placed or performed in visit on 01/19/23  POC SOFIA 2 FLU + SARS ANTIGEN FIA  Result Value Ref Range   Influenza A, POC Negative Negative   Influenza B, POC Negative Negative   SARS Coronavirus 2 Ag Negative Negative        Assessment & Plan:   Problem List Items Addressed This Visit        Respiratory   Acute rhinosinusitis - Primary    Negative for the flu and COVID  - POC SOFIA 2 FLU + SARS ANTIGEN FIA - fluticasone (FLONASE) 50 MCG/ACT nasal spray; Place 2 sprays into both nostrils daily.  Dispense: 16 g; Refill: 6 - sodium chloride (OCEAN) 0.65 % SOLN nasal spray; Place 1 spray into both nostrils as needed for congestion.  Dispense: 30 mL; Refill: 0  Patient told to call the office in 1 week if symptoms does not improve after using Flonase nasal spray , will at that time treat with antibiotics       Relevant Medications   fluticasone (FLONASE) 50 MCG/ACT nasal spray   sodium chloride (OCEAN) 0.65 % SOLN nasal spray   Other Relevant Orders   POC SOFIA 2 FLU + SARS ANTIGEN FIA (Completed)     Other   Tonsillar exudate    Small exudate noted on right tonsil, tonsil appears enlarged could be of viral origin  Test negative for strep  Denies trouble swallowing, sore throat, drooling , fever, chills  Patient told to report trouble swallowing, fever, chills, sore throat.       Relevant Orders   POCT rapid strep A    Meds ordered this encounter  Medications   fluticasone (FLONASE) 50 MCG/ACT nasal spray    Sig: Place 2 sprays into both nostrils daily.    Dispense:  16 g    Refill:  6   sodium chloride (OCEAN) 0.65 % SOLN nasal spray    Sig: Place 1 spray into both nostrils as needed for congestion.    Dispense:  30 mL    Refill:  0    No follow-ups on file.  Renee Rival, FNP

## 2023-01-19 NOTE — Assessment & Plan Note (Addendum)
Small exudate noted on right tonsil, tonsil appears enlarged could be of viral origin  Test negative for strep  Denies trouble swallowing, sore throat, drooling , fever, chills  Patient told to report trouble swallowing, fever, chills, sore throat.

## 2023-01-19 NOTE — Patient Instructions (Signed)
-   fluticasone (FLONASE) 50 MCG/ACT nasal spray; Place 2 sprays into both nostrils daily.  Dispense: 16 g; Refill: 6 - sodium chloride (OCEAN) 0.65 % SOLN nasal spray; Place 1 spray into both nostrils as needed for congestion.  Dispense: 30 mL; Refill: 0    It is important that you exercise regularly at least 30 minutes 5 times a week as tolerated  Think about what you will eat, plan ahead. Choose " clean, green, fresh or frozen" over canned, processed or packaged foods which are more sugary, salty and fatty. 70 to 75% of food eaten should be vegetables and fruit. Three meals at set times with snacks allowed between meals, but they must be fruit or vegetables. Aim to eat over a 12 hour period , example 7 am to 7 pm, and STOP after  your last meal of the day. Drink water,generally about 64 ounces per day, no other drink is as healthy. Fruit juice is best enjoyed in a healthy way, by EATING the fruit.  Thanks for choosing [facility] we consider it a privelige to serve you.

## 2023-01-19 NOTE — Assessment & Plan Note (Signed)
Negative for the flu and COVID  - POC SOFIA 2 FLU + SARS ANTIGEN FIA - fluticasone (FLONASE) 50 MCG/ACT nasal spray; Place 2 sprays into both nostrils daily.  Dispense: 16 g; Refill: 6 - sodium chloride (OCEAN) 0.65 % SOLN nasal spray; Place 1 spray into both nostrils as needed for congestion.  Dispense: 30 mL; Refill: 0  Patient told to call the office in 1 week if symptoms does not improve after using Flonase nasal spray , will at that time treat with antibiotics

## 2023-01-23 ENCOUNTER — Other Ambulatory Visit: Payer: Self-pay

## 2023-01-23 ENCOUNTER — Other Ambulatory Visit: Payer: Self-pay | Admitting: Nurse Practitioner

## 2023-01-23 DIAGNOSIS — I10 Essential (primary) hypertension: Secondary | ICD-10-CM

## 2023-01-23 MED ORDER — HYDROCHLOROTHIAZIDE 25 MG PO TABS
ORAL_TABLET | ORAL | 3 refills | Status: DC
  Filled 2023-01-23: qty 45, 90d supply, fill #0
  Filled 2023-05-03: qty 45, 90d supply, fill #1
  Filled 2023-08-27: qty 45, 90d supply, fill #2
  Filled 2023-12-20: qty 45, 90d supply, fill #3

## 2023-02-13 ENCOUNTER — Other Ambulatory Visit: Payer: Self-pay

## 2023-02-17 ENCOUNTER — Other Ambulatory Visit: Payer: Self-pay

## 2023-02-17 ENCOUNTER — Other Ambulatory Visit (HOSPITAL_COMMUNITY): Payer: Self-pay

## 2023-02-23 ENCOUNTER — Ambulatory Visit (HOSPITAL_COMMUNITY)
Admission: RE | Admit: 2023-02-23 | Discharge: 2023-02-23 | Disposition: A | Discharge status: Home / Self Care | Payer: Medicaid Other | Source: Ambulatory Visit | Attending: Student | Admitting: Student

## 2023-02-23 DIAGNOSIS — J984 Other disorders of lung: Secondary | ICD-10-CM | POA: Insufficient documentation

## 2023-02-27 ENCOUNTER — Other Ambulatory Visit: Payer: Self-pay

## 2023-02-28 NOTE — Progress Notes (Unsigned)
Synopsis: Referred for LLL pneumonia by [name], NP  Subjective:   PATIENT ID: Craig Collins GENDER: male DOB: 06-30-1966, MRN: [id number]  No chief complaint on file.  [id number] with history of BPH, ED, obesity, advanced prostate ca with LAD noted 06/2022 originally diagnosed 2020 and currently on xtandi, OSA  Went to ED for aching CP 10/21 and had CTA Chest with L sided nodular consolidation - given z pack and augmentin. CXR ordered at follow up visit 11/27 with interval worsening.  Regarding his prostate CA he has had XRT to left chest wall, left supraclavicular nodal region 07/16/22-07/29/22.   He has every now and then cough, worse when eating. He has no trouble swallowing now but did after radiation initially. He doesn't have any dyspnea or any significant DOE. No fever. Hot flashes which he attributes to xtandi.   Every now and then has postnasal drainage.  He has no family history of lung disease  He is not working currently. 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 He never smoked. No recent travel.   Interval HPI: CT dramatically improved  PFT 12/12/22 with moderate restriction, low ERV, mildly reduced diffusing capacity, +BD response  Otherwise pertinent review of systems is negative.  Past Medical History:  Diagnosis Date   Benign prostate hyperplasia    Elevated PSA    Erectile dysfunction    Hypertension    Obesity (BMI 35.0-39.9 without comorbidity)    Prostate cancer ([place]) 05/2019   Sleep apnea    Urinary retention      Family History  Problem Relation Age of Onset   Leukemia Mother 85 - 74   Hypertension Brother    Prostate cancer Maternal Uncle    Prostate cancer Maternal Grandfather    Hypertension Other    Prostate cancer Cousin        maternal first cousin   Colon cancer Neg Hx      Past Surgical History:  Procedure Laterality Date   COLONOSCOPY N/A 10/14/2017   Procedure: COLONOSCOPY;   Surgeon: Gatha Mayer, MD;  Location: [facility] ENDOSCOPY;  Service: Endoscopy;  Laterality: N/A;   ESOPHAGOGASTRODUODENOSCOPY (EGD) WITH PROPOFOL N/A 08/04/2022   Procedure: ESOPHAGOGASTRODUODENOSCOPY (EGD) WITH PROPOFOL;  Surgeon: Juanita Craver, MD;  Location: [facility] ENDOSCOPY;  Service: Gastroenterology;  Laterality: N/A;    Social History   Socioeconomic History   Marital status: Married    Spouse name: Not on file   Number of children: Not on file   Years of education: Not on file   Highest education level: Not on file  Occupational History   Not on file  Tobacco Use   Smoking status: Never   Smokeless tobacco: Never  Vaping Use   Vaping Use: Never used  Substance and Sexual Activity   Alcohol use: Not Currently   Drug use: No   Sexual activity: Yes    Birth control/protection: Condom  Other Topics Concern   Not on file  Social History Narrative   Not on file   Social Determinants of Health   Financial Resource Strain: Not on file  Food Insecurity: Not on file  Transportation Needs: Not on file  Physical Activity: Not on file  Stress: Not on file  Social Connections: Not on file  Intimate Partner Violence: Not on file     No Known Allergies   Outpatient Medications Prior to Visit  Medication Sig Dispense Refill   aspirin EC 81 MG  tablet Take 81 mg by mouth daily.     Calcium Carbonate-Vitamin D (CALCIUM 500/D) 500-125 MG-UNIT TABS Take 1 tablet by mouth daily.     cyclobenzaprine (FLEXERIL) 10 MG tablet Take 1 tablet (10 mg total) by mouth 3 (three) times daily as needed for muscle spasms. 60 tablet 3   enzalutamide (XTANDI) 40 MG capsule Take 4 capsules (160 mg total) by mouth daily. 120 capsule 1   fluticasone (FLONASE) 50 MCG/ACT nasal spray Place 2 sprays into both nostrils daily. 16 g 6   hydrochlorothiazide (HYDRODIURIL) 25 MG tablet TAKE 1/2 TABLET (12.5 MG TOTAL) BY MOUTH DAILY. 45 tablet 3   lidocaine (XYLOCAINE) 2 % solution Rinse gargle as directed 15 mLs in  the mouth or throat every 4 (four) hours as needed (esophagitis discomfort). (Patient not taking: Reported on 11/21/2022) 100 mL 1   lisinopril (ZESTRIL) 20 MG tablet TAKE 1 TABLET (20 MG TOTAL) BY MOUTH DAILY. 90 tablet 3   nystatin (MYCOSTATIN) 100000 UNIT/ML suspension Take 5 mLs by mouth 4 times daily. (Patient not taking: Reported on 01/19/2023) 60 mL 0   pantoprazole (PROTONIX) 40 MG tablet Take 1 tablet (40 mg total) by mouth daily. (Patient not taking: Reported on 10/22/2022) 30 tablet 2   sodium chloride (OCEAN) 0.65 % SOLN nasal spray Place 1 spray into both nostrils as needed for congestion. 30 mL 0   sucralfate (CARAFATE) 1 GM/10ML suspension Take 10 mLs (1 g total) by mouth 4 (four) times daily -  with meals and at bedtime. (Patient not taking: Reported on 09/04/2022) 1200 mL 2   tamsulosin (FLOMAX) 0.4 MG CAPS capsule TAKE 1 CAPSULE (0.4 MG TOTAL) BY MOUTH DAILY. 30 capsule 11   VIAGRA 25 MG tablet Take 1 tablet (25 mg total) by mouth daily as needed for erectile dysfunction. 10 tablet 0   vitamin B-12 1000 MCG tablet Take 1 tablet (1,000 mcg total) by mouth daily. 30 tablet 0   XTANDI 40 MG tablet Take 4 tablets ('160mg'$ ) by mouth once daily as directed by physician. 120 tablet 0   No facility-administered medications prior to visit.       Objective:   Physical Exam:  General appearance: 57 y.o., male, NAD, conversant  Eyes: anicteric sclerae; PERRL, tracking appropriately HENT: NCAT; MMM Neck: Trachea midline; no lymphadenopathy, no JVD Lungs: CTAB, no crackles, no wheeze, with normal respiratory effort CV: RRR, no murmur  Abdomen: Soft, non-tender; non-distended, BS present  Extremities: No peripheral edema, warm Skin: Normal turgor and texture; no rash Psych: Appropriate affect Neuro: Alert and oriented to person and place, no focal deficit     There were no vitals filed for this visit.    on RA BMI Readings from Last 3 Encounters:  01/19/23 38.69 kg/m  12/16/22  35.73 kg/m  11/21/22 35.31 kg/m   Wt Readings from Last 3 Encounters:  01/19/23 258 lb 3.2 oz (117.1 kg)  12/16/22 256 lb 3.2 oz (116.2 kg)  11/21/22 253 lb 3.2 oz (114.9 kg)     CBC    Component Value Date/Time   WBC 3.4 (L) 12/16/2022 0751   WBC 3.9 11/21/2022 1459   RBC 3.79 (L) 12/16/2022 0751   HGB 10.8 (L) 12/16/2022 0751   HGB 10.8 (L) 10/22/2022 1548   HCT 32.9 (L) 12/16/2022 0751   HCT 32.0 (L) 10/22/2022 1548   PLT 246 12/16/2022 0751   PLT 275 10/22/2022 1548   MCV 86.8 12/16/2022 0751   MCV 82 10/22/2022 1548  MCH 28.5 12/16/2022 0751   MCHC 32.8 12/16/2022 0751   RDW 14.0 12/16/2022 0751   RDW 14.0 10/22/2022 1548   LYMPHSABS 1.0 12/16/2022 0751   LYMPHSABS 2.0 06/18/2020 0921   MONOABS 0.4 12/16/2022 0751   EOSABS 0.3 12/16/2022 0751   EOSABS 0.2 06/18/2020 0921   BASOSABS 0.0 12/16/2022 0751   BASOSABS 0.0 06/18/2020 0921    Chest Imaging: CTA Chest 10/11/22 with L sided multifocal nodular consolidation  CXR 11/17/22 with persistent LUL and progressive L perihilar alveolar opacities  CT Chest 02/24/23 interval near resolution of multifocal consolidation with some residual scar  Pulmonary Functions Testing Results:    Latest Ref Rng & Units 12/12/2022    2:54 PM  PFT Results  FVC-Pre L 2.88   FVC-Predicted Pre % 58   FVC-Post L 2.94   FVC-Predicted Post % 60   Pre FEV1/FVC % % 78   Post FEV1/FCV % % 87   FEV1-Pre L 2.24   FEV1-Predicted Pre % 59   FEV1-Post L 2.55   DLCO uncorrected ml/min/mmHg 19.88   DLCO UNC% % 70   DLCO corrected ml/min/mmHg 21.99   DLCO COR %Predicted % 77   DLVA Predicted % 124   TLC L 4.55   TLC % Predicted % 65   RV % Predicted % 84    PFT 12/12/22 with moderate restriction, low ERV, mildly reduced diffusing capacity, +BD response     Assessment & Plan:   # Pneumonitis Considerations include radiation pneumonitis given distribution/timing which may be in organizing pneumonia pattern, primary or secondary  organizing pneumonia from something else, lower suspicion for even indolent infectious process given paucity of symptoms.   Plan: - CTD serologies - CT Chest - If remains concerning for radiation pneumonitis or other organizing pneumonia we discussed expectant management given paucity of symptoms but with extent of radiographic progression Mr. Cancilla and I are inclined to treat with steroid taper over a period of 6 weeks or so. Will send message as well to Dr. Tammi Klippel and Freeman Caldron.     Maryjane Hurter, MD [facility] 02/28/2023 5:02 PM

## 2023-03-03 ENCOUNTER — Encounter: Payer: Self-pay | Admitting: Student

## 2023-03-03 ENCOUNTER — Ambulatory Visit (INDEPENDENT_AMBULATORY_CARE_PROVIDER_SITE_OTHER): Payer: Medicaid Other | Admitting: Student

## 2023-03-03 ENCOUNTER — Other Ambulatory Visit: Payer: Self-pay

## 2023-03-03 VITALS — BP 122/70 | HR 89 | Temp 98.7°F | Ht 70.0 in | Wt 259.6 lb

## 2023-03-03 DIAGNOSIS — J984 Other disorders of lung: Secondary | ICD-10-CM

## 2023-03-03 DIAGNOSIS — R0609 Other forms of dyspnea: Secondary | ICD-10-CM

## 2023-03-03 MED ORDER — ALBUTEROL SULFATE HFA 108 (90 BASE) MCG/ACT IN AERS
2.0000 | INHALATION_SPRAY | Freq: Four times a day (QID) | RESPIRATORY_TRACT | 6 refills | Status: AC | PRN
  Filled 2023-03-03: qty 18, 25d supply, fill #0

## 2023-03-03 NOTE — Patient Instructions (Signed)
-   can try albuterol 1-2 puffs 5- 20 minutes before exercise to see if you notice a difference.  - if dramatic improvement or if you start to notice seasonal trouble breathing/cough then happy to see you again in clinic to discuss possibility of asthma

## 2023-03-04 ENCOUNTER — Other Ambulatory Visit: Payer: Self-pay

## 2023-03-05 ENCOUNTER — Ambulatory Visit (INDEPENDENT_AMBULATORY_CARE_PROVIDER_SITE_OTHER): Payer: Medicaid Other | Admitting: Nurse Practitioner

## 2023-03-05 ENCOUNTER — Encounter: Payer: Self-pay | Admitting: Nurse Practitioner

## 2023-03-05 VITALS — BP 115/69 | HR 87 | Temp 97.0°F | Ht 70.0 in | Wt 258.2 lb

## 2023-03-05 DIAGNOSIS — Z8701 Personal history of pneumonia (recurrent): Secondary | ICD-10-CM

## 2023-03-05 DIAGNOSIS — C61 Malignant neoplasm of prostate: Secondary | ICD-10-CM | POA: Diagnosis not present

## 2023-03-05 NOTE — Progress Notes (Signed)
$'@Patient'C$  ID: Craig Collins, male    DOB: May 07, 1966, 57 y.o.   MRN: [id number]  Chief Complaint  Patient presents with   Follow-up    Pne     Referring provider: [name], NP   HPI  Craig Collins presents for follow up.He . has a past medical history of Benign prostate hyperplasia, Elevated PSA, Erectile dysfunction, Hypertension, Obesity (BMI 35.0-39.9 without comorbidity), Prostate cancer ([place]) (05/2019), Sleep apnea, and Urinary retention.     Patient presents today for follow-up visit.  He states that he has been doing well.  He did have follow-up imaging for pneumonia that he had back in October of last year.  He has followed with pulmonary. Imaging showed that his lungs were clear other than some scarring in the bases.  He does continue to follow with oncology for prostate cancer. Denies f/c/s, n/v/d, hemoptysis, PND, leg swelling Denies chest pain or edema       No Known Allergies  Immunization History  Administered Date(s) Administered   Influenza,inj,Quad PF,6+ Mos 10/08/2020   PFIZER(Purple Top)SARS-COV-2 Vaccination 03/06/2020, 04/19/2020, 11/13/2020    Past Medical History:  Diagnosis Date   Benign prostate hyperplasia    Elevated PSA    Erectile dysfunction    Hypertension    Obesity (BMI 35.0-39.9 without comorbidity)    Prostate cancer ([place]) 05/2019   Sleep apnea    Urinary retention     Tobacco History: Social History   Tobacco Use  Smoking Status Never  Smokeless Tobacco Never   Counseling given: Not Answered   Outpatient Encounter Medications as of 03/05/2023  Medication Sig   albuterol (VENTOLIN HFA) 108 (90 Base) MCG/ACT inhaler Inhale 2 puffs into the lungs every 6 (six) hours as needed for wheezing or shortness of breath.   aspirin EC 81 MG tablet Take 81 mg by mouth daily.   Calcium Carbonate-Vitamin D (CALCIUM 500/D) 500-125 MG-UNIT TABS Take 1 tablet by mouth daily.   cyclobenzaprine (FLEXERIL) 10 MG tablet Take 1 tablet  (10 mg total) by mouth 3 (three) times daily as needed for muscle spasms.   enzalutamide (XTANDI) 40 MG capsule Take 4 capsules (160 mg total) by mouth daily.   fluticasone (FLONASE) 50 MCG/ACT nasal spray Place 2 sprays into both nostrils daily.   hydrochlorothiazide (HYDRODIURIL) 25 MG tablet TAKE 1/2 TABLET (12.5 MG TOTAL) BY MOUTH DAILY.   lisinopril (ZESTRIL) 20 MG tablet TAKE 1 TABLET (20 MG TOTAL) BY MOUTH DAILY.   sodium chloride (OCEAN) 0.65 % SOLN nasal spray Place 1 spray into both nostrils as needed for congestion.   tamsulosin (FLOMAX) 0.4 MG CAPS capsule TAKE 1 CAPSULE (0.4 MG TOTAL) BY MOUTH DAILY.   VIAGRA 25 MG tablet Take 1 tablet (25 mg total) by mouth daily as needed for erectile dysfunction.   vitamin B-12 1000 MCG tablet Take 1 tablet (1,000 mcg total) by mouth daily.   XTANDI 40 MG tablet Take 4 tablets ('160mg'$ ) by mouth once daily as directed by physician.   lidocaine (XYLOCAINE) 2 % solution Rinse gargle as directed 15 mLs in the mouth or throat every 4 (four) hours as needed (esophagitis discomfort). (Patient not taking: Reported on 03/05/2023)   nystatin (MYCOSTATIN) 100000 UNIT/ML suspension Take 5 mLs by mouth 4 times daily. (Patient not taking: Reported on 03/05/2023)   pantoprazole (PROTONIX) 40 MG tablet Take 1 tablet (40 mg total) by mouth daily. (Patient not taking: Reported on 03/05/2023)   sucralfate (CARAFATE) 1 GM/10ML suspension Take 10  mLs (1 g total) by mouth 4 (four) times daily -  with meals and at bedtime. (Patient not taking: Reported on 03/05/2023)   No facility-administered encounter medications on file as of 03/05/2023.     Review of Systems  Review of Systems  Constitutional: Negative.   HENT: Negative.    Cardiovascular: Negative.   Gastrointestinal: Negative.   Allergic/Immunologic: Negative.   Neurological: Negative.   Psychiatric/Behavioral: Negative.         Physical Exam  BP 115/69   Pulse 87   Temp (!) 97 F (36.1 C)   Ht '5\' 10"'$   (1.778 m)   Wt 258 lb 3.2 oz (117.1 kg)   SpO2 100%   BMI 37.05 kg/m   Wt Readings from Last 5 Encounters:  03/05/23 258 lb 3.2 oz (117.1 kg)  03/03/23 259 lb 9.6 oz (117.8 kg)  01/19/23 258 lb 3.2 oz (117.1 kg)  12/16/22 256 lb 3.2 oz (116.2 kg)  11/21/22 253 lb 3.2 oz (114.9 kg)     Physical Exam Vitals and nursing note reviewed.  Constitutional:      General: He is not in acute distress.    Appearance: He is well-developed.  Cardiovascular:     Rate and Rhythm: Normal rate and regular rhythm.  Pulmonary:     Effort: Pulmonary effort is normal.     Breath sounds: Normal breath sounds.  Skin:    General: Skin is warm and dry.  Neurological:     Mental Status: He is alert and oriented to person, place, and time.      Lab Results:  CBC    Component Value Date/Time   WBC 3.4 (L) 12/16/2022 0751   WBC 3.9 11/21/2022 1459   RBC 3.79 (L) 12/16/2022 0751   HGB 10.8 (L) 12/16/2022 0751   HGB 10.8 (L) 10/22/2022 1548   HCT 32.9 (L) 12/16/2022 0751   HCT 32.0 (L) 10/22/2022 1548   PLT 246 12/16/2022 0751   PLT 275 10/22/2022 1548   MCV 86.8 12/16/2022 0751   MCV 82 10/22/2022 1548   MCH 28.5 12/16/2022 0751   MCHC 32.8 12/16/2022 0751   RDW 14.0 12/16/2022 0751   RDW 14.0 10/22/2022 1548   LYMPHSABS 1.0 12/16/2022 0751   LYMPHSABS 2.0 06/18/2020 0921   MONOABS 0.4 12/16/2022 0751   EOSABS 0.3 12/16/2022 0751   EOSABS 0.2 06/18/2020 0921   BASOSABS 0.0 12/16/2022 0751   BASOSABS 0.0 06/18/2020 0921    BMET    Component Value Date/Time   NA 141 12/16/2022 0751   NA 142 10/22/2022 1548   K 3.9 12/16/2022 0751   CL 107 12/16/2022 0751   CO2 26 12/16/2022 0751   GLUCOSE 98 12/16/2022 0751   BUN 23 (H) 12/16/2022 0751   BUN 19 10/22/2022 1548   CREATININE 1.40 (H) 12/16/2022 0751   CREATININE 1.39 (H) 10/28/2017 0958   CALCIUM 10.0 12/16/2022 0751   GFRNONAA 59 (L) 12/16/2022 0751   GFRAA 65 06/18/2020 0921    BNP No results found for:  "BNP"  ProBNP No results found for: "PROBNP"  Imaging: CT Chest Wo Contrast  Result Date: 02/24/2023 CLINICAL DATA:  Pneumonitis EXAM: CT CHEST WITHOUT CONTRAST TECHNIQUE: Multidetector CT imaging of the chest was performed following the standard protocol without IV contrast. RADIATION DOSE REDUCTION: This exam was performed according to the departmental dose-optimization program which includes automated exposure control, adjustment of the mA and/or kV according to patient size and/or use of iterative reconstruction technique. COMPARISON:  11/22/2022 FINDINGS: Cardiovascular: No significant vascular findings. Normal heart size. No pericardial effusion. Mediastinum/Nodes: Left supraclavicular adenopathy stable. No suspicious mediastinal or hilar nodes identified. Thyroid gland, trachea, and esophagus demonstrate no significant findings. Lungs/Pleura: Parenchymal consolidation observed on the prior study has nearly resolved. There is some residual subsegmental atelectasis or scarring in the left base, left apex and right middle lobe. No pleural effusion or pneumothorax. Upper Abdomen: No acute abnormality. Musculoskeletal: Multifocal sclerotic osseous metastatic disease. Mild upper thoracic T4 compression deformity appears unchanged. Mild bilateral gynecomastia. IMPRESSION: 1. Left-sided parenchymal consolidation present previously has nearly resolved with some residual subsegmental atelectasis or scarring. 2. Left supraclavicular adenopathy is stable finding. 3. Otherwise no acute cardiopulmonary process identified. Electronically Signed   By: Sammie Bench M.D.   On: 02/24/2023 11:06     Assessment & Plan:   Prostate cancer [facility]) Continue to follow with oncology  2. History of pneumonia  Continue to follow with pulmonary   Follow up:  Follow up in 6 months     [name], NP 03/05/2023

## 2023-03-05 NOTE — Patient Instructions (Signed)
1. Prostate cancer ([place])  Continue to follow with oncology  2. History of pneumonia  Continue to follow with pulmonary   Follow up:  Follow up in 6 months

## 2023-03-05 NOTE — Assessment & Plan Note (Signed)
Continue to follow with oncology  2. History of pneumonia  Continue to follow with pulmonary   Follow up:  Follow up in 6 months

## 2023-03-10 ENCOUNTER — Other Ambulatory Visit: Payer: Self-pay

## 2023-03-18 ENCOUNTER — Other Ambulatory Visit (HOSPITAL_COMMUNITY): Payer: Self-pay

## 2023-03-18 DIAGNOSIS — C61 Malignant neoplasm of prostate: Secondary | ICD-10-CM | POA: Diagnosis not present

## 2023-03-18 DIAGNOSIS — C7951 Secondary malignant neoplasm of bone: Secondary | ICD-10-CM | POA: Diagnosis not present

## 2023-03-18 DIAGNOSIS — C778 Secondary and unspecified malignant neoplasm of lymph nodes of multiple regions: Secondary | ICD-10-CM | POA: Diagnosis not present

## 2023-03-18 DIAGNOSIS — N522 Drug-induced erectile dysfunction: Secondary | ICD-10-CM | POA: Diagnosis not present

## 2023-03-18 MED ORDER — TADALAFIL 20 MG PO TABS
20.0000 mg | ORAL_TABLET | Freq: Every day | ORAL | 11 refills | Status: DC | PRN
  Filled 2023-03-18: qty 10, 10d supply, fill #0

## 2023-03-23 ENCOUNTER — Other Ambulatory Visit: Payer: Self-pay

## 2023-03-23 ENCOUNTER — Other Ambulatory Visit (HOSPITAL_COMMUNITY): Payer: Self-pay

## 2023-03-27 ENCOUNTER — Other Ambulatory Visit: Payer: Self-pay

## 2023-03-27 DIAGNOSIS — C61 Malignant neoplasm of prostate: Secondary | ICD-10-CM

## 2023-04-01 ENCOUNTER — Other Ambulatory Visit: Payer: Self-pay

## 2023-04-02 ENCOUNTER — Telehealth: Payer: Self-pay | Admitting: Nurse Practitioner

## 2023-04-15 NOTE — Progress Notes (Unsigned)
Palliative Medicine [facility]  Telephone:([phone]) [phone] Fax:(336) [phone]   Name: Craig Collins Date: 04/15/2023 MRN: [id number]  DOB: 08/23/1966  Patient Care Team: Ivonne Andrew, NP as PCP - General (Pulmonary Disease) Cherlyn Cushing, RN as Oncology Nurse Navigator    REASON FOR CONSULTATION: Craig Collins is a 57 y.o. 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 Palliative ask to see for symptom and pain management and goals of care.    SOCIAL HISTORY:    Craig Collins reports that he has never smoked. He has never used smokeless tobacco. He reports that he does not currently use alcohol. He reports that he does not use drugs.  ADVANCE DIRECTIVES:  None on file  CODE STATUS: Full code  PAST MEDICAL HISTORY: Past Medical History:  Diagnosis Date   Benign prostate hyperplasia    Elevated PSA    Erectile dysfunction    Hypertension    Obesity (BMI 35.0-39.9 without comorbidity)    Prostate cancer (HCC) 05/2019   Sleep apnea    Urinary retention     PAST SURGICAL HISTORY:  Past Surgical History:  Procedure Laterality Date   COLONOSCOPY N/A 10/14/2017   Procedure: COLONOSCOPY;  Surgeon: Iva Boop, MD;  Location: [facility] ENDOSCOPY;  Service: Endoscopy;  Laterality: N/A;   ESOPHAGOGASTRODUODENOSCOPY (EGD) WITH PROPOFOL N/A 08/04/2022   Procedure: ESOPHAGOGASTRODUODENOSCOPY (EGD) WITH PROPOFOL;  Surgeon: Charna Elizabeth, MD;  Location: [facility] ENDOSCOPY;  Service: Gastroenterology;  Laterality: N/A;    HEMATOLOGY/ONCOLOGY HISTORY:  Oncology History  Prostate cancer  12/21/2019 Initial Diagnosis   Prostate cancer (HCC)   07/08/2022 Cancer Staging   Staging form: Prostate, AJCC 8th Edition - Clinical: Stage IVB (cTX, cNX, pM1b, PSA: 42, Grade Group: 5) - Signed by Benjiman Core, MD on  07/08/2022 Prostate specific antigen (PSA) range: 20 or greater Histologic grading system: 5 grade system     ALLERGIES:  has No Known Allergies.  MEDICATIONS:  Current Outpatient Medications  Medication Sig Dispense Refill   albuterol (VENTOLIN HFA) 108 (90 Base) MCG/ACT inhaler Inhale 2 puffs into the lungs every 6 (six) hours as needed for wheezing or shortness of breath. 18 g 6   aspirin EC 81 MG tablet Take 81 mg by mouth daily.     Calcium Carbonate-Vitamin D (CALCIUM 500/D) 500-125 MG-UNIT TABS Take 1 tablet by mouth daily.     cyclobenzaprine (FLEXERIL) 10 MG tablet Take 1 tablet (10 mg total) by mouth 3 (three) times daily as needed for muscle spasms. 60 tablet 3   enzalutamide (XTANDI) 40 MG capsule Take 4 capsules (160 mg total) by mouth daily. 120 capsule 1   fluticasone (FLONASE) 50 MCG/ACT nasal spray Place 2 sprays into both nostrils daily. 16 g 6   hydrochlorothiazide (HYDRODIURIL) 25 MG tablet TAKE 1/2 TABLET (12.5 MG TOTAL) BY MOUTH DAILY. 45 tablet 3   lidocaine (XYLOCAINE) 2 % solution Rinse gargle as directed 15 mLs in the mouth or throat every 4 (four) hours as needed (esophagitis discomfort). (Patient not taking: Reported on 03/05/2023) 100 mL 1   lisinopril (ZESTRIL) 20 MG tablet TAKE 1 TABLET (20 MG TOTAL) BY MOUTH DAILY. 90 tablet 3   nystatin (MYCOSTATIN) 100000 UNIT/ML suspension Take 5 mLs by mouth 4 times daily. (Patient not taking: Reported on 03/05/2023) 60 mL 0   pantoprazole (PROTONIX) 40 MG tablet Take 1 tablet (40 mg total)  by mouth daily. (Patient not taking: Reported on 03/05/2023) 30 tablet 2   sodium chloride (OCEAN) 0.65 % SOLN nasal spray Place 1 spray into both nostrils as needed for congestion. 30 mL 0   sucralfate (CARAFATE) 1 GM/10ML suspension Take 10 mLs (1 g total) by mouth 4 (four) times daily -  with meals and at bedtime. (Patient not taking: Reported on 03/05/2023) 1200 mL 2   tadalafil (CIALIS) 20 MG tablet Take 1 tablet (20 mg total) by mouth  daily as needed. 10 tablet 11   tamsulosin (FLOMAX) 0.4 MG CAPS capsule TAKE 1 CAPSULE (0.4 MG TOTAL) BY MOUTH DAILY. 30 capsule 11   VIAGRA 25 MG tablet Take 1 tablet (25 mg total) by mouth daily as needed for erectile dysfunction. 10 tablet 0   vitamin B-12 1000 MCG tablet Take 1 tablet (1,000 mcg total) by mouth daily. 30 tablet 0   XTANDI 40 MG tablet Take 4 tablets ( ) by mouth once daily as directed by physician. 120 tablet 0   No current facility-administered medications for this visit.    VITAL SIGNS: BP 128/86 (BP Location: Left Arm, Patient Position: Sitting)   Pulse 89   Temp 97.6 F (36.4 C) (Temporal)   Resp 18   Ht  (1.778 m)   Wt 118.2 kg   SpO2 99%   BMI 37.38 kg/m  Filed Weights   04/16/23 1112  Weight: 118.2 kg    Estimated body mass index is 37.38 kg/m as calculated from the following:   Height as of this encounter:  (1.778 m).   Weight as of this encounter: 118.2 kg.  LABS: CBC:    Component Value Date/Time   WBC 3.4 (L) 12/16/2022 0751   WBC 3.9 11/21/2022 1459   HGB 10.8 (L) 12/16/2022 0751   HGB 10.8 (L) 10/22/2022 1548   HCT 32.9 (L) 12/16/2022 0751   HCT 32.0 (L) 10/22/2022 1548   PLT 246 12/16/2022 0751   PLT 275 10/22/2022 1548   MCV 86.8 12/16/2022 0751   MCV 82 10/22/2022 1548   NEUTROABS 1.6 (L) 12/16/2022 0751   NEUTROABS 1.9 06/18/2020 0921   LYMPHSABS 1.0 12/16/2022 0751   LYMPHSABS 2.0 06/18/2020 0921   MONOABS 0.4 12/16/2022 0751   EOSABS 0.3 12/16/2022 0751   EOSABS 0.2 06/18/2020 0921   BASOSABS 0.0 12/16/2022 0751   BASOSABS 0.0 06/18/2020 0921   Comprehensive Metabolic Panel:    Component Value Date/Time   NA 141 12/16/2022 0751   NA 142 10/22/2022 1548   K 3.9 12/16/2022 0751   CL 107 12/16/2022 0751   CO2 26 12/16/2022 0751   BUN 23 (H) 12/16/2022 0751   BUN 19 10/22/2022 1548   CREATININE 1.40 (H) 12/16/2022 0751   CREATININE 1.39 (H) 10/28/2017 0958   GLUCOSE 98 12/16/2022 0751   CALCIUM 10.0  12/16/2022 0751   AST 10 (L) 12/16/2022 0751   ALT 8 12/16/2022 0751   ALKPHOS 158 (H) 12/16/2022 0751   BILITOT 0.3 12/16/2022 0751   PROT 7.5 12/16/2022 0751   PROT 6.6 10/22/2022 1548   ALBUMIN 4.0 12/16/2022 0751   ALBUMIN 3.9 10/22/2022 1548    RADIOGRAPHIC STUDIES: No results found.  PERFORMANCE STATUS (ECOG) : 1 - Symptomatic but completely ambulatory  Review of Systems  Constitutional:  Positive for appetite change, diaphoresis and fatigue.  Gastrointestinal:  Positive for constipation.  Musculoskeletal:  Positive for arthralgias and back pain.  Unless otherwise noted, a complete review of systems is negative.  Physical Exam General: NAD Cardiovascular: regular rate and rhythm Pulmonary: clear ant fields Abdomen: rounded, soft, nontender, + bowel sounds Extremities: no edema, no joint deformities Skin: no rashes Neurological: alert, oriented x 4, engaging  IMPRESSION: Mr. Elting presents alone to this visit. He is alert, oriented, and engaging. Mr. Pellow lives alone in his son's apartment. Son visits frequently but lives elsewhere. Patient has two sons and a host of friends that are supportive of him. 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 He has not been able to work for about a year. Mr. Wishon also enjoyed weight-lifting and has not been able to enjoy doing that recently.  I introduced myself, Nikki NP, Maygan RN, and Palliative's role in collaboration with the oncology team. Concept of Palliative Care was introduced as specialized medical care for people and their families living with serious illness.  It focuses on providing relief from the symptoms and stress of a serious illness.  The goal is to improve quality of life for both the patient and the family. Values and goals of care important to patient and family were attempted to be elicited.   Pain Mr. Yo reports having good days and bad days  at it regards his pain. Some days, there is no pain. Other days, pain is 9 out of 10. Pain most frequently occurs between shoulder blades and back. Describes it as feeling like someone has punched him in the middle of his back which radiates to ribs/side area, left greater than right. Mr. Bresee explains that having pain in this area puts him "on edge" because it is near his heart. Pain frequently interrupts his sleep and he finds it comfortable to sleep in the recliner most nights.  Reports that, briefly, radiation decreased his pain but then also led to painful swallowing and pneumonitis. Has tried muscle relaxer before - but did not like it because it made him very sleepy. Currently takes hot showers and utilizes Tylenol 650 mg on an as-needed basis. Tries not to take this every day. Expresses being conscientious of protecting his organs and not wanting to cause them damage. Is open to taking a stronger medication but is hopeful to be on as few pills as possible.  Education provided on the use of medication on as needed basis. Education provided on use, frequency, risk, benefits, and potential side effects. Mr. Rocks verbalized understanding and appreciation. Will start Tramadol 50 mg every 6 hours as needed for pain.  Complete physical, medication, medical, and psychosocial review completed.  Patient denies any use of illicit drugs or alcohol. Understands if ever positive this would be grounds for no longer receiving any further opioids. Extensive discussions and explanation of palliative's role in collaboration with his oncology team to assist in his pain and symptom management. Education provided pain contract and guidelines for ongoing support. He verbalized understanding. Pain contract completed.    2.  Fatigue Endorses fatigue as second concern. Feels that the pain and fatigue are closely related. With his history of weight-lifting, he has tried exercise as a means of increasing his energy.  Explains, however, that any repetitive motion increases pain and diminishes his energy. With activities such as cooking and cleaning, he has to sit down to take breaks.   We discussed continuing with activity as he has been but with understanding it is ok to take breaks as needed to allow for recovery. Encouraged to listen to his body. He is hopeful  to get back to a state of health allowing him to do some of the things he once enjoyed.   He is currently taking Vitamin B12 per day. Recommended increasing to 2 capsules (2000 mcg/day with room for further adjustments). He plans to begin taking protein shakes/drinks to allow for additional support.    3.  Decreased appetite Reports that, since receiving radiation and chemotherapy, food does not taste the same. He has always been a good cook and reports that even his own food does not taste good. Usually eats in the mornings and then again later in the evening. Drinks a protein supplement daily. Will soon be starting a new product called Mus[employer] Defense that he reports has amino acids. Shares this was approved by his Urologist.  Education provided on the use of lemon/citrus to cleanse his palate/stimulate taste buds.   4.  Constipation Endorses some difficulty with passing stools at baseline. States he normally has a bowel movement daily. Educated regarding the risk for increased constipation with initiation of pain medication. He has dulcolax on hand at home. Education provided on taking Colace daily in addition to Senna every other day if no bowel movement within 24 hours at any given time. Patient verbalizes understanding.   5.  Goals of Care We discussed Mr. Boody current illness and what it means in the larger context of his on-going co-morbidities. Natural disease trajectory and expectations were discussed. We discussed the importance of continued conversation with family and their medical providers regarding overall plan of care and  treatment options, ensuring decisions are within the context of the patients values and GOCs.  Mr. Bremer relates that it has become more difficult for him to leave home, mostly due to the pain and fatigue. There are other factors as well, such as the sweating that he experiences without warning. Patient reports this makes him feel "blue." He is hopeful that an improvement in his pain will translate into an improvement in his energy levels.   Mr. Prinsen relies on his faith as a source of strength in coping with his illness. He states that he does not want to claim having cancer and intentionally turns off the television when there is content on cancer or death. He is trying to enjoy each day of his life and not look at cancer as a death sentence.   PLAN: Established therapeutic relationship. Education provided on palliative's role in collaboration with their Oncology team. Begin Tramadol 50 mg PO every 6 hours as needed for pain. Begin Vitamin B12 2,000 mcg PO daily. Encourage pacing of activity and taking rest breaks. Begin Colace 100 mg PO daily. Senna  Will continue conversations about goals of care. Palliative will plan to see patient back in 2-4 weeks in collaboration to other oncology appointments.    Patient expressed understanding and was in agreement with this plan. He also understands that he can call the clinic at any time with any questions, concerns, or complaints.   Thank you for your referral and allowing Palliative to assist in Mr. Pranay Hilbun [facility] care.   Number and complexity of problems addressed: HIGH - 1 or more chronic illnesses with SEVERE exacerbation, progression, or side effects of treatment - advanced cancer, pain. Any controlled substances utilized were prescribed in the context of palliative care.  I assessed patient with Marylene Land, NP Student. Agree with above findings.   Visit consisted of counseling and education dealing with the complex and emotionally intense  issues of symptom management and  palliative care in the setting of serious and potentially life-threatening illness.Greater than 50%  of this time was spent counseling and coordinating care related to the above assessment and plan.  Signed by: Katy Apo, RN MSN [facility] / NP Student   Willette Alma, AGPCNP-BC Palliative Medicine Team/[facility]   *Please note that this is a verbal dictation therefore any spelling or grammatical errors are due to the "Dragon Medical One" system interpretation.

## 2023-04-16 ENCOUNTER — Other Ambulatory Visit: Payer: Self-pay

## 2023-04-16 ENCOUNTER — Other Ambulatory Visit (HOSPITAL_COMMUNITY): Payer: Self-pay

## 2023-04-16 ENCOUNTER — Encounter: Payer: Self-pay | Admitting: Nurse Practitioner

## 2023-04-16 ENCOUNTER — Inpatient Hospital Stay: Payer: Medicaid Other | Attending: Nurse Practitioner | Admitting: Nurse Practitioner

## 2023-04-16 VITALS — BP 128/86 | HR 89 | Temp 97.6°F | Resp 18 | Ht 70.0 in | Wt 260.5 lb

## 2023-04-16 DIAGNOSIS — C7951 Secondary malignant neoplasm of bone: Secondary | ICD-10-CM | POA: Diagnosis not present

## 2023-04-16 DIAGNOSIS — Z515 Encounter for palliative care: Secondary | ICD-10-CM

## 2023-04-16 DIAGNOSIS — R530 Neoplastic (malignant) related fatigue: Secondary | ICD-10-CM | POA: Diagnosis not present

## 2023-04-16 DIAGNOSIS — G893 Neoplasm related pain (acute) (chronic): Secondary | ICD-10-CM

## 2023-04-16 DIAGNOSIS — K5900 Constipation, unspecified: Secondary | ICD-10-CM

## 2023-04-16 DIAGNOSIS — C61 Malignant neoplasm of prostate: Secondary | ICD-10-CM

## 2023-04-16 MED ORDER — TRAMADOL HCL 50 MG PO TABS
50.0000 mg | ORAL_TABLET | Freq: Four times a day (QID) | ORAL | 0 refills | Status: DC | PRN
  Filled 2023-04-16 – 2023-05-01 (×2): qty 45, 12d supply, fill #0

## 2023-04-24 ENCOUNTER — Other Ambulatory Visit (HOSPITAL_COMMUNITY): Payer: Self-pay

## 2023-04-27 ENCOUNTER — Other Ambulatory Visit: Payer: Self-pay

## 2023-05-01 ENCOUNTER — Other Ambulatory Visit (HOSPITAL_COMMUNITY): Payer: Self-pay

## 2023-05-05 ENCOUNTER — Other Ambulatory Visit: Payer: Self-pay

## 2023-05-12 DIAGNOSIS — H2513 Age-related nuclear cataract, bilateral: Secondary | ICD-10-CM | POA: Diagnosis not present

## 2023-05-12 DIAGNOSIS — H43393 Other vitreous opacities, bilateral: Secondary | ICD-10-CM | POA: Diagnosis not present

## 2023-05-18 NOTE — Progress Notes (Unsigned)
Palliative Medicine [facility]  Telephone:(336) [phone] Fax:(336) [phone]   Name: Craig Collins Date: 05/18/2023 MRN: [id number]  DOB: 1966/03/19  Patient Care Team: Ivonne Andrew, NP as PCP - General (Pulmonary Disease) Cherlyn Cushing, RN as Oncology Nurse Navigator   I connected with [name] on 05/18/23 at  2:30 PM EDT by phone and verified that I am speaking with the correct person using two identifiers.   I discussed the limitations, risks, security and privacy concerns of performing an evaluation and management service by telemedicine and the availability of in-person appointments. I also discussed with the patient that there may be a patient responsible charge related to this service. The patient expressed understanding and agreed to proceed.   Other persons participating in the visit and their role in the encounter: n/a   Patient's location: home  Provider's location: [facility]   Chief Complaint: f/u of symptom management    INTERVAL HISTORY: Craig Collins is a 57 y.o. 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 Palliative ask to see for symptom and pain management and goals of care.   SOCIAL HISTORY:     reports that he has never smoked. He has never used smokeless tobacco. He reports that he does not currently use alcohol. He reports that he does not use drugs.  ADVANCE DIRECTIVES:  None on file  CODE STATUS: Full code  PAST MEDICAL HISTORY: Past Medical History:  Diagnosis Date   Benign prostate hyperplasia    Elevated PSA    Erectile dysfunction    Hypertension    Obesity (BMI 35.0-39.9 without comorbidity)    Prostate cancer (HCC) 05/2019   Sleep apnea    Urinary retention     ALLERGIES:  has No Known Allergies.  MEDICATIONS:  Current Outpatient Medications  Medication  Sig Dispense Refill   albuterol (VENTOLIN HFA) 108 (90 Base) MCG/ACT inhaler Inhale 2 puffs into the lungs every 6 (six) hours as needed for wheezing or shortness of breath. 18 g 6   aspirin EC 81 MG tablet Take 81 mg by mouth daily.     Calcium Carbonate-Vitamin D (CALCIUM 500/D) 500-125 MG-UNIT TABS Take 1 tablet by mouth daily.     cyclobenzaprine (FLEXERIL) 10 MG tablet Take 1 tablet (10 mg total) by mouth 3 (three) times daily as needed for muscle spasms. 60 tablet 3   enzalutamide (XTANDI) 40 MG capsule Take 4 capsules (160 mg total) by mouth daily. 120 capsule 1   fluticasone (FLONASE) 50 MCG/ACT nasal spray Place 2 sprays into both nostrils daily. 16 g 6   hydrochlorothiazide (HYDRODIURIL) 25 MG tablet TAKE 1/2 TABLET (12.5 MG TOTAL) BY MOUTH DAILY. 45 tablet 3   lisinopril (ZESTRIL) 20 MG tablet TAKE 1 TABLET (20 MG TOTAL) BY MOUTH DAILY. 90 tablet 3   pantoprazole (PROTONIX) 40 MG tablet Take 1 tablet (40 mg total) by mouth daily. (Patient not taking: Reported on 03/05/2023) 30 tablet 2   sodium chloride (OCEAN) 0.65 % SOLN nasal spray Place 1 spray into both nostrils as needed for congestion. 30 mL 0   tadalafil (CIALIS) 20 MG tablet Take 1 tablet (20 mg total) by mouth daily as needed. 10 tablet 11   tamsulosin (FLOMAX) 0.4 MG CAPS capsule TAKE 1 CAPSULE (0.4 MG TOTAL) BY MOUTH DAILY. 30 capsule 11   traMADol (ULTRAM) 50 MG tablet Take 1 tablet (  50 mg total) by mouth every 6 (six) hours as needed for moderate pain or severe pain. 45 tablet 0   VIAGRA 25 MG tablet Take 1 tablet (25 mg total) by mouth daily as needed for erectile dysfunction. 10 tablet 0   vitamin B-12 1000 MCG tablet Take 1 tablet (1,000 mcg total) by mouth daily. 30 tablet 0   XTANDI 40 MG tablet Take 4 tablets (160mg ) by mouth once daily as directed by physician. 120 tablet 0   No current facility-administered medications for this visit.    VITAL SIGNS: There were no vitals taken for this visit. There were no  vitals filed for this visit.  Estimated body mass index is 37.38 kg/m as calculated from the following:   Height as of 04/16/23: 5\' 10"  (1.778 m).   Weight as of 04/16/23: 260 lb 8 oz (118.2 kg).   PERFORMANCE STATUS (ECOG) : 1 - Symptomatic but completely ambulatory   Physical Exam General: NAD Cardiovascular: regular rate and rhythm Pulmonary: clear ant fields Abdomen: soft, nontender, + bowel sounds Extremities: no edema, no joint deformities Skin: no rashes Neurological:   IMPRESSION:   Pain Craig Collins reports having good days and bad days at it regards his pain. Some days, there is no pain. Other days, pain is 9 out of 10. Pain most frequently occurs between shoulder blades and back. Describes it as feeling like someone has punched him in the middle of his back which radiates to ribs/side area, left greater than right. Craig Collins explains that having pain in this area puts him "on edge" because it is near his heart. Pain frequently interrupts his sleep and he finds it comfortable to sleep in the recliner most nights.   Reports that, briefly, radiation decreased his pain but then also led to painful swallowing and pneumonitis. Has tried muscle relaxer before - but did not like it because it made him very sleepy. Currently takes hot showers and utilizes Tylenol 650 mg on an as-needed basis. Tries not to take this every day. Expresses being conscientious of protecting his organs and not wanting to cause them damage. Is open to taking a stronger medication but is hopeful to be on as few pills as possible.   Education provided on the use of medication on as needed basis. Education provided on use, frequency, risk, benefits, and potential side effects. Craig Collins verbalized understanding and appreciation. Will start Tramadol 50 mg every 6 hours as needed for pain.   Complete physical, medication, medical, and psychosocial review completed.  Patient denies any use of illicit drugs or  alcohol. Understands if ever positive this would be grounds for no longer receiving any further opioids. Extensive discussions and explanation of palliative's role in collaboration with his oncology team to assist in his pain and symptom management. Education provided pain contract and guidelines for ongoing support. He verbalized understanding. Pain contract completed.     2.  Fatigue    3.  Decreased appetite     4.  Constipation     5.  Goals of Care  04/16/23-We discussed Mr. Coby current illness and what it means in the larger context of his on-going co-morbidities. Natural disease trajectory and expectations were discussed. We discussed the importance of continued conversation with family and their medical providers regarding overall plan of care and treatment options, ensuring decisions are within the context of the patients values and GOCs.   Mr. Bordner relates that it has become more difficult for him to leave  home, mostly due to the pain and fatigue. There are other factors as well, such as the sweating that he experiences without warning. Patient reports this makes him feel "blue." He is hopeful that an improvement in his pain will translate into an improvement in his energy levels.    Mr. Kazimer relies on his faith as a source of strength in coping with his illness. He states that he does not want to claim having cancer and intentionally turns off the television when there is content on cancer or death. He is trying to enjoy each day of his life and not look at cancer as a death sentence.    We discussed Her current illness and what it means in the larger context of Her on-going co-morbidities. Natural disease trajectory and expectations were discussed.  I discussed the importance of continued conversation with family and their medical providers regarding overall plan of care and treatment options, ensuring decisions are within the context of the patients values and  GOCs.  PLAN: Established therapeutic relationship. Education provided on palliative's role in collaboration with their Oncology team. Begin Tramadol 50 mg PO every 6 hours as needed for pain. Begin Vitamin B12 2,000 mcg PO daily. Encourage pacing of activity and taking rest breaks. Begin Colace 100 mg PO daily. Senna  Will continue conversations about goals of care. Palliative will plan to see patient back in 2-4 weeks in collaboration to other oncology appointments.    Patient expressed understanding and was in agreement with this plan. He also understands that He can call the clinic at any time with any questions, concerns, or complaints.   Any controlled substances utilized were prescribed in the context of palliative care. PDMP has been reviewed.    Visit consisted of counseling and education dealing with the complex and emotionally intense issues of symptom management and palliative care in the setting of serious and potentially life-threatening illness.Greater than 50%  of this time was spent counseling and coordinating care related to the above assessment and plan.  Willette Alma, AGPCNP-BC  Palliative Medicine Team/[facility]  *Please note that this is a verbal dictation therefore any spelling or grammatical errors are due to the "Dragon Medical One" system interpretation.

## 2023-05-19 ENCOUNTER — Other Ambulatory Visit (HOSPITAL_COMMUNITY): Payer: Self-pay

## 2023-05-19 ENCOUNTER — Inpatient Hospital Stay: Payer: Medicaid Other | Attending: Nurse Practitioner | Admitting: Nurse Practitioner

## 2023-05-19 DIAGNOSIS — C7951 Secondary malignant neoplasm of bone: Secondary | ICD-10-CM | POA: Diagnosis not present

## 2023-05-19 DIAGNOSIS — Z515 Encounter for palliative care: Secondary | ICD-10-CM

## 2023-05-19 DIAGNOSIS — G893 Neoplasm related pain (acute) (chronic): Secondary | ICD-10-CM

## 2023-05-19 DIAGNOSIS — C61 Malignant neoplasm of prostate: Secondary | ICD-10-CM | POA: Diagnosis not present

## 2023-05-19 DIAGNOSIS — R530 Neoplastic (malignant) related fatigue: Secondary | ICD-10-CM

## 2023-05-19 MED ORDER — TRAMADOL HCL 50 MG PO TABS
50.0000 mg | ORAL_TABLET | Freq: Four times a day (QID) | ORAL | 0 refills | Status: DC | PRN
  Filled 2023-05-19 – 2023-07-31 (×2): qty 90, 12d supply, fill #0

## 2023-05-20 ENCOUNTER — Encounter: Payer: Self-pay | Admitting: Nurse Practitioner

## 2023-05-28 ENCOUNTER — Other Ambulatory Visit (HOSPITAL_COMMUNITY): Payer: Self-pay

## 2023-06-04 ENCOUNTER — Other Ambulatory Visit: Payer: Self-pay

## 2023-06-05 ENCOUNTER — Other Ambulatory Visit: Payer: Self-pay

## 2023-06-10 ENCOUNTER — Telehealth: Payer: Self-pay | Admitting: Nurse Practitioner

## 2023-06-10 NOTE — Telephone Encounter (Signed)
Scheduled appointment per staff message. Patient is aware of the made appointment. 

## 2023-06-12 NOTE — Progress Notes (Unsigned)
Palliative Medicine [facility]  [phone] [phone]   Name: Craig Collins Date: 06/12/2023 MRN: [id number]  DOB: [birth year]  Patient Care Team: Ivonne Andrew, NP as PCP - General (Pulmonary Disease) Cherlyn Cushing, RN as Oncology Nurse Navigator   INTERVAL HISTORY: Craig Collins is a 57 y.o. 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 Palliative ask to see for symptom and pain management and goals of care.   SOCIAL HISTORY:     reports that he has never smoked. He has never used smokeless tobacco. He reports that he does not currently use alcohol. He reports that he does not use drugs.  ADVANCE DIRECTIVES:  None on file  CODE STATUS: Full code  PAST MEDICAL HISTORY: Past Medical History:  Diagnosis Date   Benign prostate hyperplasia    Elevated PSA    Erectile dysfunction    Hypertension    Obesity (BMI 35.0-39.9 without comorbidity)    Prostate cancer (HCC) 05/2019   Sleep apnea    Urinary retention     ALLERGIES:  has No Known Allergies.  MEDICATIONS:  Current Outpatient Medications  Medication Sig Dispense Refill   albuterol (VENTOLIN HFA) 108 (90 Base) MCG/ACT inhaler Inhale 2 puffs into the lungs every 6 (six) hours as needed for wheezing or shortness of breath. 18 g 6   aspirin EC 81 MG tablet Take 81 mg by mouth daily.     Calcium Carbonate-Vitamin D (CALCIUM 500/D) 500-125 MG-UNIT TABS Take 1 tablet by mouth daily.     cyclobenzaprine (FLEXERIL) 10 MG tablet Take 1 tablet (10 mg total) by mouth 3 (three) times daily as needed for muscle spasms. 60 tablet 3   enzalutamide (XTANDI) 40 MG capsule Take 4 capsules (160 mg total) by mouth daily. 120 capsule 1   fluticasone (FLONASE) 50 MCG/ACT nasal spray Place 2 sprays into both nostrils daily. 16 g 6    hydrochlorothiazide (HYDRODIURIL) 25 MG tablet TAKE 1/2 TABLET (12.5 MG TOTAL) BY MOUTH DAILY. 45 tablet 3   lisinopril (ZESTRIL) 20 MG tablet TAKE 1 TABLET (20 MG TOTAL) BY MOUTH DAILY. 90 tablet 3   pantoprazole (PROTONIX) 40 MG tablet Take 1 tablet (40 mg total) by mouth daily. (Patient not taking: Reported on 03/05/2023) 30 tablet 2   sodium chloride (OCEAN) 0.65 % SOLN nasal spray Place 1 spray into both nostrils as needed for congestion. 30 mL 0   tadalafil (CIALIS) 20 MG tablet Take 1 tablet (20 mg total) by mouth daily as needed. 10 tablet 11   tamsulosin (FLOMAX) 0.4 MG CAPS capsule TAKE 1 CAPSULE (0.4 MG TOTAL) BY MOUTH DAILY. 30 capsule 11   traMADol (ULTRAM) 50 MG tablet Take 1 - 2 tablets (50 - 100 mg total) by mouth every 6 hours as needed for moderate pain or severe pain. 90 tablet 0   VIAGRA 25 MG tablet Take 1 tablet (25 mg total) by mouth daily as needed for erectile dysfunction. 10 tablet 0   vitamin B-12 1000 MCG tablet Take 1 tablet (1,000 mcg total) by mouth daily. 30 tablet 0   XTANDI 40 MG tablet Take 4 tablets (160mg ) by mouth once daily as directed by physician. 120 tablet 0   No current facility-administered medications for this visit.    VITAL SIGNS: There were no vitals taken for this visit. There were no vitals filed for this  visit.  Estimated body mass index is 37.38 kg/m as calculated from the following:   Height as of 04/16/23: 5\' 10"  (1.778 m).   Weight as of 04/16/23: 260 lb 8 oz (118.2 kg).   PERFORMANCE STATUS (ECOG) : 1 - Symptomatic but completely ambulatory  Assessment NAD RRR Normal breathing pattern +BS, nontender, nondistended  AAO x4  IMPRESSION: Craig Collins presents to clinic today with his wife for symptom management follow-up. No acute distress. He is trying to remain as active as possible however shares this has been a challenge as his pain is starting to escalate with increased activity. Denies nausea, vomiting, or diarrhea. Appetite is  fair.   Pain Craig Collins reports his pain has worsened despite use of Tramadol as prescribed. Reports taking 2-3 times daily with minimal relief. Craig Collins states when he takes the Tramadol this "knocks the edge off" however only last for an hour or so. Pain worsens with activity which limits his ability to be as active as he would like. He is hopeful that his pain can be better controlled improving his quality of life.   We discussed at length his pain and realistic goals. I recommended starting long-acting pain medication to gain better control of his pain. Extensive education provided on use of Xtampza including potential side effects, efficacy, and use. Patient and wife understands he can continue to use Tramadol as needed for breakthrough pain. Craig Collins and wife verbalized understanding and agreement. Prescription to be called in to requested pharmacy. We will start with trial dose before filling full month supply allowing for tolerance and any needed adjustments. He is aware of potential side effects and when to seek medical attention or discontinue use. [name] is aware medication can be discontinued and is safe to use under medical supervision in the setting of his neoplasm related pain.   We will plan to continue close follow-up with scheduled phone call in a week. He knows to contact office sooner if needed.    2. Constipation Craig Collins endorses occasional constipation. Reports he currently taking dulcolax gel caps daily. Education provided on importance of daily regimen with goal of bowel movement daily no more than every other day. Recommended he begin taking daily Miralax. If no bowel movement may increase to twice daily.   3. Goals of Care  04/16/23-We discussed Craig Collins current illness and what it means in the larger context of his on-going co-morbidities. Natural disease trajectory and expectations were discussed. We discussed the importance of continued conversation with family and  their medical providers regarding overall plan of care and treatment options, ensuring decisions are within the context of the patients values and GOCs.   Craig Collins relates that it has become more difficult for him to leave home, mostly due to the pain and fatigue. There are other factors as well, such as the sweating that he experiences without warning. Patient reports this makes him feel "blue." He is hopeful that an improvement in his pain will translate into an improvement in his energy levels.    Mr. Detweiler relies on his faith as a source of strength in coping with his illness. He states that he does not want to claim having cancer and intentionally turns off the television when there is content on cancer or death. He is trying to enjoy each day of his life and not look at cancer as a death sentence.   I discussed the importance of continued conversation with family and their medical providers regarding overall  plan of care and treatment options, ensuring decisions are within the context of the patients values and GOCs.  PLAN:  Xtampza 9mg  every 12 hours.  Tramadol 50-100 mg PO every 6 hours as needed for pain. Vitamin B12 2,000 mcg PO daily. Encourage pacing of activity and taking rest breaks. Senna daily for bowel regimen Miralax daily for bowel regimen Will continue conversations about goals of care. Palliative will plan to see patient back in 2-4 weeks in collaboration to other oncology appointments. Will follow-up in one week by telephone for close evaluation on pain management.    Patient expressed understanding and was in agreement with this plan. He also understands that He can call the clinic at any time with any questions, concerns, or complaints.   Any controlled substances utilized were prescribed in the context of palliative care. PDMP has been reviewed.    Visit consisted of counseling and education dealing with the complex and emotionally intense issues of symptom  management and palliative care in the setting of serious and potentially life-threatening illness.Greater than 50%  of this time was spent counseling and coordinating care related to the above assessment and plan.  Willette Alma, AGPCNP-BC  Palliative Medicine Team/[facility]  *Please note that this is a verbal dictation therefore any spelling or grammatical errors are due to the "Dragon Medical One" system interpretation.

## 2023-06-17 ENCOUNTER — Encounter: Payer: Self-pay | Admitting: Nurse Practitioner

## 2023-06-17 ENCOUNTER — Other Ambulatory Visit: Payer: Self-pay

## 2023-06-17 ENCOUNTER — Inpatient Hospital Stay: Payer: Medicaid Other | Attending: Nurse Practitioner | Admitting: Nurse Practitioner

## 2023-06-17 VITALS — BP 133/89 | HR 80 | Temp 98.5°F | Resp 16 | Wt 258.0 lb

## 2023-06-17 DIAGNOSIS — Z7189 Other specified counseling: Secondary | ICD-10-CM

## 2023-06-17 DIAGNOSIS — K5903 Drug induced constipation: Secondary | ICD-10-CM | POA: Diagnosis not present

## 2023-06-17 DIAGNOSIS — C61 Malignant neoplasm of prostate: Secondary | ICD-10-CM | POA: Diagnosis not present

## 2023-06-17 DIAGNOSIS — Z515 Encounter for palliative care: Secondary | ICD-10-CM | POA: Diagnosis not present

## 2023-06-17 DIAGNOSIS — C7951 Secondary malignant neoplasm of bone: Secondary | ICD-10-CM | POA: Diagnosis not present

## 2023-06-17 DIAGNOSIS — R530 Neoplastic (malignant) related fatigue: Secondary | ICD-10-CM | POA: Diagnosis not present

## 2023-06-17 DIAGNOSIS — G893 Neoplasm related pain (acute) (chronic): Secondary | ICD-10-CM | POA: Diagnosis not present

## 2023-06-17 MED ORDER — XTAMPZA ER 9 MG PO C12A
9.0000 mg | EXTENDED_RELEASE_CAPSULE | Freq: Two times a day (BID) | ORAL | 0 refills | Status: DC
Start: 2023-06-17 — End: 2023-07-08
  Filled 2023-06-17: qty 30, 15d supply, fill #0

## 2023-06-17 MED ORDER — CYANOCOBALAMIN 1000 MCG PO TABS
2000.0000 ug | ORAL_TABLET | Freq: Every day | ORAL | 0 refills | Status: AC
Start: 2023-06-17 — End: ?

## 2023-06-19 ENCOUNTER — Other Ambulatory Visit: Payer: Self-pay

## 2023-06-23 ENCOUNTER — Inpatient Hospital Stay: Payer: Medicaid Other | Attending: Nurse Practitioner | Admitting: Nurse Practitioner

## 2023-06-23 DIAGNOSIS — C7951 Secondary malignant neoplasm of bone: Secondary | ICD-10-CM | POA: Insufficient documentation

## 2023-06-23 DIAGNOSIS — G893 Neoplasm related pain (acute) (chronic): Secondary | ICD-10-CM | POA: Insufficient documentation

## 2023-06-23 DIAGNOSIS — C61 Malignant neoplasm of prostate: Secondary | ICD-10-CM | POA: Insufficient documentation

## 2023-06-23 NOTE — Progress Notes (Signed)
This palliative RN called to check in on pt since the addition of xtampza. Pt reports that pain has become "more manageable" and he is now taking 1 tramadol instead of 2. His reports having enough tramadol to take 1-2 tabs if he needs to. Pt has no questions or concerns at this time.

## 2023-06-24 DIAGNOSIS — C7951 Secondary malignant neoplasm of bone: Secondary | ICD-10-CM | POA: Diagnosis not present

## 2023-06-24 DIAGNOSIS — C61 Malignant neoplasm of prostate: Secondary | ICD-10-CM | POA: Diagnosis not present

## 2023-06-24 DIAGNOSIS — C778 Secondary and unspecified malignant neoplasm of lymph nodes of multiple regions: Secondary | ICD-10-CM | POA: Diagnosis not present

## 2023-06-30 ENCOUNTER — Other Ambulatory Visit (HOSPITAL_COMMUNITY): Payer: Self-pay | Admitting: Urology

## 2023-06-30 DIAGNOSIS — C7951 Secondary malignant neoplasm of bone: Secondary | ICD-10-CM

## 2023-06-30 DIAGNOSIS — C778 Secondary and unspecified malignant neoplasm of lymph nodes of multiple regions: Secondary | ICD-10-CM

## 2023-06-30 DIAGNOSIS — C61 Malignant neoplasm of prostate: Secondary | ICD-10-CM

## 2023-07-02 ENCOUNTER — Telehealth: Payer: Self-pay | Admitting: Hematology

## 2023-07-07 NOTE — Progress Notes (Unsigned)
Palliative Medicine [facility]  Telephone:([phone]) [phone] [phone]   Name: Craig Collins Date: 07/07/2023 MRN: [id number]  DOB: 1966-11-08  Patient Care Team: Ivonne Andrew, NP as PCP - General (Pulmonary Disease) Cherlyn Cushing, RN as Oncology Nurse Navigator   I connected with [name] on 07/07/23 at  1:00 PM EDT by phone and verified that I am speaking with the correct person using two identifiers.   I discussed the limitations, risks, security and privacy concerns of performing an evaluation and management service by telemedicine and the availability of in-person appointments. I also discussed with the patient that there may be a patient responsible charge related to this service. The patient expressed understanding and agreed to proceed.   Other persons participating in the visit and their role in the encounter: n/a   Patient's location: home  Provider's location: [facility]   Chief Complaint: f/u of symptom management    INTERVAL HISTORY: Craig Collins is a 57 y.o. 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 Palliative ask to see for symptom and pain management and goals of care.   SOCIAL HISTORY:     reports that he has never smoked. He has never used smokeless tobacco. He reports that he does not currently use alcohol. He reports that he does not use drugs.  ADVANCE DIRECTIVES:  None on file  CODE STATUS: Full code  PAST MEDICAL HISTORY: Past Medical History:  Diagnosis Date   Benign prostate hyperplasia    Elevated PSA    Erectile dysfunction    Hypertension    Obesity (BMI 35.0-39.9 without comorbidity)    Prostate cancer (HCC) 05/2019   Sleep apnea    Urinary retention     ALLERGIES:  has No Known Allergies.  MEDICATIONS:  Current Outpatient Medications  Medication  Sig Dispense Refill   albuterol (VENTOLIN HFA) 108 (90 Base) MCG/ACT inhaler Inhale 2 puffs into the lungs every 6 (six) hours as needed for wheezing or shortness of breath. 18 g 6   aspirin EC 81 MG tablet Take 81 mg by mouth daily.     Calcium Carbonate-Vitamin D (CALCIUM 500/D) 500-125 MG-UNIT TABS Take 1 tablet by mouth daily.     cyanocobalamin 1000 MCG tablet Take 2 tablets (2,000 mcg total) by mouth daily. 30 tablet 0   enzalutamide (XTANDI) 40 MG capsule Take 4 capsules (160 mg total) by mouth daily. 120 capsule 1   fluticasone (FLONASE) 50 MCG/ACT nasal spray Place 2 sprays into both nostrils daily. 16 g 6   hydrochlorothiazide (HYDRODIURIL) 25 MG tablet TAKE 1/2 TABLET (12.5 MG TOTAL) BY MOUTH DAILY. 45 tablet 3   lisinopril (ZESTRIL) 20 MG tablet TAKE 1 TABLET (20 MG TOTAL) BY MOUTH DAILY. 90 tablet 3   oxyCODONE ER (XTAMPZA ER) 9 MG C12A Take 9 mg by mouth every 12 (twelve) hours. 30 capsule 0   pantoprazole (PROTONIX) 40 MG tablet Take 1 tablet (40 mg total) by mouth daily. (Patient not taking: Reported on 03/05/2023) 30 tablet 2   sodium chloride (OCEAN) 0.65 % SOLN nasal spray Place 1 spray into both nostrils as needed for congestion. 30 mL 0   tadalafil (CIALIS) 20 MG tablet Take 1 tablet (20 mg total) by mouth daily as needed. 10 tablet 11   tamsulosin (FLOMAX) 0.4 MG CAPS capsule TAKE 1 CAPSULE (0.4 MG TOTAL) BY MOUTH DAILY. 30  capsule 11   traMADol (ULTRAM) 50 MG tablet Take 1 - 2 tablets (50 - 100 mg total) by mouth every 6 hours as needed for moderate pain or severe pain. 90 tablet 0   VIAGRA 25 MG tablet Take 1 tablet (25 mg total) by mouth daily as needed for erectile dysfunction. 10 tablet 0   XTANDI 40 MG tablet Take 4 tablets (160mg ) by mouth once daily as directed by physician. 120 tablet 0   No current facility-administered medications for this visit.    VITAL SIGNS: There were no vitals taken for this visit. There were no vitals filed for this visit.  Estimated  body mass index is 37.02 kg/m as calculated from the following:   Height as of 04/16/23: 5\' 10"  (1.778 m).   Weight as of 06/17/23: 258 lb (117 kg).   PERFORMANCE STATUS (ECOG) : 1 - Symptomatic but completely ambulatory  IMPRESSION: I connected by phone with Craig Collins. No acute distress noted. Is taking things one day at a time. States today is a good day. Yesterday not the best. Denies nausea, vomiting, constipation, or diarrhea. Appetite is fair.   Pain Mr. Fester reports his pain is well controlled overall on current regimen. Some days better than others. Yesterday pain seemed worst. He is appreciative that today is better. Pain worsens with activity which limits his ability to be as active as he would like.   We discussed his current regimen: Donal is tolerating Xtampza 9mg  every 12 hours and Tramadol 50-100 mg every 6 hours as needed for breakthrough pain. Does not require around the clock. Tolerating regimen with no unwanted side effects.   No changes in medications at this time. Will continue to closely monitor. Patient knows to contact office as needed.   2. Constipation Controlled with daily regimen  3. Goals of Care  04/16/23-We discussed Craig Collins current illness and what it means in the larger context of his on-going co-morbidities. Natural disease trajectory and expectations were discussed. We discussed the importance of continued conversation with family and their medical providers regarding overall plan of care and treatment options, ensuring decisions are within the context of the patients values and GOCs.   Craig Collins relates that it has become more difficult for him to leave home, mostly due to the pain and fatigue. There are other factors as well, such as the sweating that he experiences without warning. Patient reports this makes him feel "blue." He is hopeful that an improvement in his pain will translate into an improvement in his energy levels.    Craig Collins  relies on his faith as a source of strength in coping with his illness. He states that he does not want to claim having cancer and intentionally turns off the television when there is content on cancer or death. He is trying to enjoy each day of his life and not look at cancer as a death sentence.   I discussed the importance of continued conversation with family and their medical providers regarding overall plan of care and treatment options, ensuring decisions are within the context of the patients values and GOCs.  PLAN:  Xtampza 9mg  every 12 hours.  Tramadol 50-100 mg PO every 6 hours as needed for pain. Vitamin B12 2,000 mcg PO daily. Encourage pacing of activity and taking rest breaks. Senna daily for bowel regimen Miralax daily for bowel regimen Will continue conversations about goals of care. Palliative will plan to see patient back in 2-4 weeks in collaboration  to other oncology appointments. Will follow-up in one week by telephone for close evaluation on pain management.    Patient expressed understanding and was in agreement with this plan. He also understands that He can call the clinic at any time with any questions, concerns, or complaints.   Any controlled substances utilized were prescribed in the context of palliative care. PDMP has been reviewed.    Visit consisted of counseling and education dealing with the complex and emotionally intense issues of symptom management and palliative care in the setting of serious and potentially life-threatening illness.Greater than 50%  of this time was spent counseling and coordinating care related to the above assessment and plan.  Willette Alma, AGPCNP-BC  Palliative Medicine Team/[facility]  *Please note that this is a verbal dictation therefore any spelling or grammatical errors are due to the "Dragon Medical One" system interpretation.

## 2023-07-08 ENCOUNTER — Other Ambulatory Visit: Payer: Self-pay

## 2023-07-08 ENCOUNTER — Inpatient Hospital Stay (HOSPITAL_BASED_OUTPATIENT_CLINIC_OR_DEPARTMENT_OTHER): Payer: Medicaid Other | Admitting: Nurse Practitioner

## 2023-07-08 ENCOUNTER — Encounter: Payer: Self-pay | Admitting: Nurse Practitioner

## 2023-07-08 DIAGNOSIS — Z515 Encounter for palliative care: Secondary | ICD-10-CM | POA: Diagnosis not present

## 2023-07-08 DIAGNOSIS — C7951 Secondary malignant neoplasm of bone: Secondary | ICD-10-CM | POA: Diagnosis not present

## 2023-07-08 DIAGNOSIS — C61 Malignant neoplasm of prostate: Secondary | ICD-10-CM | POA: Diagnosis not present

## 2023-07-08 DIAGNOSIS — G893 Neoplasm related pain (acute) (chronic): Secondary | ICD-10-CM

## 2023-07-08 DIAGNOSIS — R530 Neoplastic (malignant) related fatigue: Secondary | ICD-10-CM

## 2023-07-08 MED ORDER — XTAMPZA ER 9 MG PO C12A
9.0000 mg | EXTENDED_RELEASE_CAPSULE | Freq: Two times a day (BID) | ORAL | 0 refills | Status: DC
Start: 2023-07-08 — End: 2023-08-26
  Filled 2023-07-08: qty 60, 30d supply, fill #0

## 2023-07-09 ENCOUNTER — Other Ambulatory Visit: Payer: Self-pay

## 2023-07-14 ENCOUNTER — Other Ambulatory Visit: Payer: Self-pay

## 2023-07-15 ENCOUNTER — Other Ambulatory Visit: Payer: Self-pay

## 2023-07-16 ENCOUNTER — Encounter (HOSPITAL_COMMUNITY)
Admission: RE | Admit: 2023-07-16 | Discharge: 2023-07-16 | Disposition: A | Discharge status: Home / Self Care | Payer: Medicaid Other | Source: Ambulatory Visit | Attending: Urology | Admitting: Urology

## 2023-07-16 DIAGNOSIS — C7951 Secondary malignant neoplasm of bone: Secondary | ICD-10-CM | POA: Insufficient documentation

## 2023-07-16 DIAGNOSIS — C61 Malignant neoplasm of prostate: Secondary | ICD-10-CM | POA: Insufficient documentation

## 2023-07-16 DIAGNOSIS — C96A Histiocytic sarcoma: Secondary | ICD-10-CM | POA: Diagnosis not present

## 2023-07-16 DIAGNOSIS — C7952 Secondary malignant neoplasm of bone marrow: Secondary | ICD-10-CM | POA: Diagnosis not present

## 2023-07-16 DIAGNOSIS — C778 Secondary and unspecified malignant neoplasm of lymph nodes of multiple regions: Secondary | ICD-10-CM | POA: Diagnosis not present

## 2023-07-16 MED ORDER — PIFLIFOLASTAT F 18 (PYLARIFY) INJECTION
9.0000 | Freq: Once | INTRAVENOUS | Status: AC
  Administered 2023-07-16: 9 via INTRAVENOUS

## 2023-07-20 ENCOUNTER — Other Ambulatory Visit: Payer: Self-pay

## 2023-07-20 DIAGNOSIS — C61 Malignant neoplasm of prostate: Secondary | ICD-10-CM

## 2023-07-20 NOTE — Assessment & Plan Note (Addendum)
-  Staging IV with node metastasis, castration resistant. -Diagnosed in 2020, initially presented with Gleason score 9 and PSA 42 -he started ADT in 2020, initially responded with PSA dropped to 2.77 in November 2021.  PSA started rising and went to 9.3 in November 2022.  Patient was not very compliant with his treatment. -He received palliative radiation to the left chest wall and left supraclavicular nodes in 2023. -He started Xtandi 160 mg daily in July 2023. He responded well and PSA has been less than 1 since then. -He is tolerating treatment well, will continue. -We reviewed other treatment options if he has disease progression down the road, including chemotherapy, Pluvicto, and targeted therapy depending on his genetic testing results. -Patient understands the goal of therapy is palliative, for disease control and prolong his life.  His disease, unfortunately may is not curable.

## 2023-07-21 ENCOUNTER — Inpatient Hospital Stay: Payer: Medicaid Other

## 2023-07-21 ENCOUNTER — Encounter: Payer: Self-pay | Admitting: Hematology

## 2023-07-21 ENCOUNTER — Inpatient Hospital Stay (HOSPITAL_BASED_OUTPATIENT_CLINIC_OR_DEPARTMENT_OTHER): Payer: Medicaid Other | Admitting: Hematology

## 2023-07-21 VITALS — BP 126/87 | HR 91 | Temp 99.7°F | Resp 18 | Ht 70.0 in | Wt 246.3 lb

## 2023-07-21 DIAGNOSIS — G893 Neoplasm related pain (acute) (chronic): Secondary | ICD-10-CM | POA: Diagnosis not present

## 2023-07-21 DIAGNOSIS — C61 Malignant neoplasm of prostate: Secondary | ICD-10-CM

## 2023-07-21 DIAGNOSIS — C7951 Secondary malignant neoplasm of bone: Secondary | ICD-10-CM

## 2023-07-21 LAB — CBC WITH DIFFERENTIAL (CANCER CENTER ONLY)
Abs Immature Granulocytes: 0.02 10*3/uL (ref 0.00–0.07)
Basophils Absolute: 0 10*3/uL (ref 0.0–0.1)
Basophils Relative: 0 %
Eosinophils Absolute: 0.2 10*3/uL (ref 0.0–0.5)
Eosinophils Relative: 5 %
HCT: 29.2 % — ABNORMAL LOW (ref 39.0–52.0)
Hemoglobin: 9.8 g/dL — ABNORMAL LOW (ref 13.0–17.0)
Immature Granulocytes: 1 %
Lymphocytes Relative: 22 %
Lymphs Abs: 0.9 10*3/uL (ref 0.7–4.0)
MCH: 28.2 pg (ref 26.0–34.0)
MCHC: 33.6 g/dL (ref 30.0–36.0)
MCV: 83.9 fL (ref 80.0–100.0)
Monocytes Absolute: 0.5 10*3/uL (ref 0.1–1.0)
Monocytes Relative: 13 %
Neutro Abs: 2.5 10*3/uL (ref 1.7–7.7)
Neutrophils Relative %: 59 %
Platelet Count: 321 10*3/uL (ref 150–400)
RBC: 3.48 MIL/uL — ABNORMAL LOW (ref 4.22–5.81)
RDW: 12.8 % (ref 11.5–15.5)
WBC Count: 4.2 10*3/uL (ref 4.0–10.5)
nRBC: 0 % (ref 0.0–0.2)

## 2023-07-21 LAB — CMP (CANCER CENTER ONLY)
ALT: 7 U/L (ref 0–44)
AST: 8 U/L — ABNORMAL LOW (ref 15–41)
Albumin: 3.7 g/dL (ref 3.5–5.0)
Alkaline Phosphatase: 313 U/L — ABNORMAL HIGH (ref 38–126)
Anion gap: 10 (ref 5–15)
BUN: 15 mg/dL (ref 6–20)
CO2: 24 mmol/L (ref 22–32)
Calcium: 9.9 mg/dL (ref 8.9–10.3)
Chloride: 106 mmol/L (ref 98–111)
Creatinine: 1.09 mg/dL (ref 0.61–1.24)
GFR, Estimated: >60 mL/min (ref >60)
Glucose, Bld: 111 mg/dL — ABNORMAL HIGH (ref 70–99)
Potassium: 3.4 mmol/L — ABNORMAL LOW (ref 3.5–5.1)
Sodium: 140 mmol/L (ref 135–145)
Total Bilirubin: 0.4 mg/dL (ref 0.3–1.2)
Total Protein: 7.8 g/dL (ref 6.5–8.1)

## 2023-07-21 NOTE — Progress Notes (Signed)
START ON PATHWAY REGIMEN - Prostate ? ? ?  A cycle is every 21 days: ?    Prednisone  ?    Docetaxel  ? ?**Always confirm dose/schedule in your pharmacy ordering system** ? ?Patient Characteristics: ?Adenocarcinoma, Recurrent/New Systemic Disease (Including Biochemical Recurrence), Castration Resistant, M1, Prior Novel Hormonal Agent, No Molecular Alteration or Targeted Therapy Exhausted, No Prior Docetaxel ?Histology: Adenocarcinoma ?Therapeutic Status: Recurrent/New Systemic Disease (Including Biochemical Recurrence) ? ?Intent of Therapy: ?Non-Curative / Palliative Intent, Discussed with Patient ?

## 2023-07-21 NOTE — Progress Notes (Signed)
LVM for [employer] [phone]) regarding changing pt's dental work appt to an earlier date d/t pt will be starting chemotherapy.  Requested a return call.   2nd Entry - attempted calling [facility] again regarding pt's dental work.  No answer and unable to leave a voicemail this time.  Will try again on 07/22/2023.

## 2023-07-21 NOTE — Assessment & Plan Note (Signed)
-  He is on oxycodone extended release and tramadol for breakthrough pain -Follow-up with palliative care NP [facility]

## 2023-07-21 NOTE — Progress Notes (Signed)
[facility]   [phone] [phone]   Clinic Follow up Note   Patient Care Team: Ivonne Andrew, NP as PCP - General (Pulmonary Disease) Cherlyn Cushing, RN as Oncology Nurse Navigator Malachy Mood, MD as Consulting Physician (Hematology and Oncology)  Date of Service:  07/21/2023  CHIEF COMPLAINT: f/u of prostate cancer   CURRENT THERAPY:  Xtandi 160 mg daily started on July 15, 2022.   ASSESSMENT:  Craig Collins is a 57 y.o. male with   Prostate cancer (HCC) -Staging IV with node metastasis, castration resistant. -Diagnosed in 2020, initially presented with Gleason score 9 and PSA 42 -he started ADT in 2020, initially responded with PSA dropped to 2.77 in November 2021.  PSA started rising and went to 9.3 in November 2022.  Patient was not very compliant with his treatment. -He received palliative radiation to the left chest wall and left supraclavicular nodes in 2023. -He started Xtandi 160 mg daily in July 2023. He responded well and PSA has been less than 1 since then. -During his recent visit with [facility] Dr.Dr. Mena Goes on 06/17/2023, he was found to have elevated PSA 5.73, and he has developed low back pain and bilateral hip pain, concerning for disease progression. -He underwent restaging PSMA PET scan on July 16, 2023.  The scan has not been read by radiologist.  I personally reviewed the the scan images and compared to his previous PET scan from 2023, which showed mixed response, with worsening bone metastasis in pelvis and bilateral hip, but the improvement in the adenopathy and bone mets in lower neck and upper chest, probably partially related to previous radiation. -We reviewed the next line treatment options, including chemotherapy, Pluvicto, and targeted therapy depending on his genetic testing results.  His previously genetic testing was negative.  I will request next generation sequencing on his initial prostate biopsy sample. -If the formal  PET scan reading confirms disease progression, I recommend changing treatment to chemotherapy docetaxel every 3 weeks, for up to 10 cycles. --Chemotherapy consent: Side effects including but does not not limited to, fatigue, nausea, vomiting, diarrhea, hair loss, neuropathy, fluid retention, renal and kidney dysfunction, neutropenic fever, needed for blood transfusion, bleeding, were discussed with patient in great detail.  Patient initially was reluctant to take chemotherapy, after lengthy discussion, he agrees to proceed. -Echo of chemotherapy is palliative, for disease control and prolong his life. -Plan to start chemo in 2 weeks, I recommend port placement by IR, he agrees.   Cancer related pain -He is on oxycodone extended release and tramadol for breakthrough pain -Follow-up with palliative care NP Nikki   Metastasis to bone [facility]) -Due to extensive bone metastasis, he is at high risk for fracture -Continue vitamin D and calcium supplement -I discussed the benefit of Xgeva or Zometa.  He still has dental work to complete, he is scheduled for deep cleaning in November, I will call his dental office to see if they can move this up.    PLAN: - PET scan report is pending - I personally viewed the PET scan w/ pt -lab reviewed hg -9.8 -CMP reviewed, Potassium 3.4 -discuss chemotherapy ,docetaxel  -Test Tumor tissue for FO -Pt  is open to Chemo therapy -Discuss Port placement and chemo education -start treatment in 2 weeks -f/u with palliative care   SUMMARY OF ONCOLOGIC HISTORY: Oncology History Overview Note   Cancer Staging  Prostate cancer [facility]) Staging form: Prostate, AJCC 8th Edition - Clinical: Stage IVB (cTX,  cNX, pM1b, PSA: 42, Grade Group: 5) - Signed by Benjiman Core, MD on 07/08/2022 Prostate specific antigen (PSA) range: 20 or greater Histologic grading system: 5 grade system     Prostate cancer (HCC)  12/21/2019 Initial Diagnosis   Prostate cancer (HCC)    07/08/2022 Cancer Staging   Staging form: Prostate, AJCC 8th Edition - Clinical: Stage IVB (cTX, cNX, pM1b, PSA: 42, Grade Group: 5) - Signed by Benjiman Core, MD on 07/08/2022 Prostate specific antigen (PSA) range: 20 or greater Histologic grading system: 5 grade system      INTERVAL HISTORY:  Craig Collins is here for a follow up of prostate cancer. He was last seen by me on 12/16/2022. He presents to the clinic accompanied by wife. Pt still se Dr. Mena Goes and he receive his injections with him, Pt state that he feels pain in both hips for about two weeks. He feels pain when he is mobile. For pain he takes Tramadol and Oxycodone. Pt state that he has  pain in his lower back   All other systems were reviewed with the patient and are negative.  MEDICAL HISTORY:  Past Medical History:  Diagnosis Date   Benign prostate hyperplasia    Elevated PSA    Erectile dysfunction    Hypertension    Obesity (BMI 35.0-39.9 without comorbidity)    Prostate cancer (HCC) 05/2019   Sleep apnea    Urinary retention     SURGICAL HISTORY: Past Surgical History:  Procedure Laterality Date   COLONOSCOPY N/A 10/14/2017   Procedure: COLONOSCOPY;  Surgeon: Iva Boop, MD;  Location: [facility] ENDOSCOPY;  Service: Endoscopy;  Laterality: N/A;   ESOPHAGOGASTRODUODENOSCOPY (EGD) WITH PROPOFOL N/A 08/04/2022   Procedure: ESOPHAGOGASTRODUODENOSCOPY (EGD) WITH PROPOFOL;  Surgeon: Charna Elizabeth, MD;  Location: [facility] ENDOSCOPY;  Service: Gastroenterology;  Laterality: N/A;    I have reviewed the social history and family history with the patient and they are unchanged from previous note.  ALLERGIES:  has No Known Allergies.  MEDICATIONS:  Current Outpatient Medications  Medication Sig Dispense Refill   albuterol (VENTOLIN HFA) 108 (90 Base) MCG/ACT inhaler Inhale 2 puffs into the lungs every 6 (six) hours as needed for wheezing or shortness of breath. 18 g 6   aspirin EC 81 MG tablet Take 81 mg by mouth  daily.     Calcium Carbonate-Vitamin D (CALCIUM 500/D) 500-125 MG-UNIT TABS Take 1 tablet by mouth daily.     cyanocobalamin 1000 MCG tablet Take 2 tablets (2,000 mcg total) by mouth daily. 30 tablet 0   enzalutamide (XTANDI) 40 MG capsule Take 4 capsules (160 mg total) by mouth daily. 120 capsule 1   fluticasone (FLONASE) 50 MCG/ACT nasal spray Place 2 sprays into both nostrils daily. 16 g 6   hydrochlorothiazide (HYDRODIURIL) 25 MG tablet TAKE 1/2 TABLET (12.5 MG TOTAL) BY MOUTH DAILY. 45 tablet 3   lisinopril (ZESTRIL) 20 MG tablet TAKE 1 TABLET (20 MG TOTAL) BY MOUTH DAILY. 90 tablet 3   oxyCODONE ER (XTAMPZA ER) 9 MG C12A Take 9 mg by mouth every 12 (twelve) hours. 60 capsule 0   sodium chloride (OCEAN) 0.65 % SOLN nasal spray Place 1 spray into both nostrils as needed for congestion. 30 mL 0   tadalafil (CIALIS) 20 MG tablet Take 1 tablet (20 mg total) by mouth daily as needed. 10 tablet 11   tamsulosin (FLOMAX) 0.4 MG CAPS capsule TAKE 1 CAPSULE (0.4 MG TOTAL) BY MOUTH DAILY. 30 capsule 11  traMADol (ULTRAM) 50 MG tablet Take 1 - 2 tablets (50 - 100 mg total) by mouth every 6 hours as needed for moderate pain or severe pain. 90 tablet 0   VIAGRA 25 MG tablet Take 1 tablet (25 mg total) by mouth daily as needed for erectile dysfunction. 10 tablet 0   XTANDI 40 MG tablet Take 4 tablets (160mg ) by mouth once daily as directed by physician. 120 tablet 0   No current facility-administered medications for this visit.    PHYSICAL EXAMINATION: ECOG PERFORMANCE STATUS: 1 - Symptomatic but completely ambulatory  Vitals:   07/21/23 1134  BP: 126/87  Pulse: 91  Resp: 18  Temp: 99.7 F (37.6 C)  SpO2: 100%   Wt Readings from Last 3 Encounters:  07/21/23 246 lb 4.8 oz (111.7 kg)  06/17/23 258 lb (117 kg)  04/16/23 260 lb 8 oz (118.2 kg)     GENERAL:alert, no distress and comfortable SKIN: skin color normal, no rashes or significant lesions EYES: normal, Conjunctiva are pink and  non-injected, sclera clear  NEURO: alert & oriented x 3 with fluent speech NECK: (-)supple, thyroid normal size, non-tender, without nodularity LYMPH: (-) no palpable lymphadenopathy in the cervical, axillary  LUNGS:(-) clear to auscultation and percussion with normal breathing effort HEART: (+)regular rate & rhythm and no murmurs and no lower extremity edema LABORATORY DATA:  I have reviewed the data as listed    Latest Ref Rng & Units 07/21/2023   10:36 AM 12/16/2022    7:51 AM 11/21/2022    2:59 PM  CBC  WBC 4.0 - 10.5 K/uL 4.2  3.4  3.9   Hemoglobin 13.0 - 17.0 g/dL 9.8  60.1  09.3   Hematocrit 39.0 - 52.0 % 29.2  32.9  34.2   Platelets 150 - 400 K/uL 321  246  292         Latest Ref Rng & Units 07/21/2023   10:36 AM 12/16/2022    7:51 AM 10/22/2022    3:48 PM  CMP  Glucose 70 - 99 mg/dL 235  98  573   BUN 6 - 20 mg/dL 15  23  19    Creatinine 0.61 - 1.24 mg/dL 2.20  2.54  2.70   Sodium 135 - 145 mmol/L 140  141  142   Potassium 3.5 - 5.1 mmol/L 3.4  3.9  3.9   Chloride 98 - 111 mmol/L 106  107  107   CO2 22 - 32 mmol/L 24  26  20    Calcium 8.9 - 10.3 mg/dL 9.9  62.3  9.2   Total Protein 6.5 - 8.1 g/dL 7.8  7.5  6.6   Total Bilirubin 0.3 - 1.2 mg/dL 0.4  0.3  <7.6   Alkaline Phos 38 - 126 U/L 313  158  232   AST 15 - 41 U/L 8  10  6    ALT 0 - 44 U/L 7  8  7        RADIOGRAPHIC STUDIES: I have personally reviewed the radiological images as listed and agreed with the findings in the report. No results found.    Orders Placed This Encounter  Procedures   Prostate-Specific AG, Serum    Standing Status:   Standing    Number of Occurrences:   20    Standing Expiration Date:   07/20/2024   All questions were answered. The patient knows to call the clinic with any problems, questions or concerns. No barriers to learning was detected. The total  time spent in the appointment was 60 minutes.     Malachy Mood, MD 07/21/2023   Carolin Coy, CMA, am acting as scribe  for Malachy Mood, MD.   I have reviewed the above documentation for accuracy and completeness, and I agree with the above.

## 2023-07-21 NOTE — Assessment & Plan Note (Signed)
-  Due to extensive bone metastasis, he is at high risk for fracture -Continue vitamin D and calcium supplement -I discussed the benefit of Xgeva or Zometa.  He still has dental work to complete, he is scheduled for deep cleaning in November, I will call his dental office to see if they can move this up.

## 2023-07-22 ENCOUNTER — Other Ambulatory Visit: Payer: Self-pay

## 2023-07-22 NOTE — Progress Notes (Signed)
[facility] ordered of prostate biopsy Case#[id number] w/GPA.  FO requisition#[id number].    @1152  on 07/22/2023  - 2nd Request for expedited read on pt's PET Scan.  Spoke w/Shirl w/[facility].

## 2023-07-23 ENCOUNTER — Telehealth: Payer: Self-pay

## 2023-07-23 NOTE — Telephone Encounter (Signed)
LVM x 4 with [facility] [phone]) regarding pt's upcoming dental work.  Requested if someone would please return the call.  Awaiting a returned call.  Dr. Mosetta Putt notified that [facility] has been contacted numerous times regarding pt's new diagnosis of cancer and upcoming dental procedure.

## 2023-07-23 NOTE — Telephone Encounter (Signed)
Pam with [facility] return call.  Informed Pam that pt informed Dr. Mosetta Putt that he is scheduled for dental work and now Dr. Mosetta Putt would like to know if the dental work could be done sooner so Dr. Mosetta Putt could start pt's chemo tx.  Pam stated the pt is currently scheduled for a deep cleaning in November and December of 2024 and they currently do not have any appts earlier than what the pt is already scheduled.  Informed Pam that once the pt starts chemotherapy the pt will not be able to have a deep cleaning or any major dental work d/t he will be immunocompromised.  Pam verbalized understanding and and stated she will cancel the deep cleaning appts for this pt.  Informed Pam that once the pt starts chemo, the pt can have regular cleanings but No deep cleans or major dental work w/out clearance from Dr. Mosetta Putt.  Pam verbalized understanding.  Also, stated that Dr. Mosetta Putt will probably start the pt on Zometa or Xgeva d/t pt has bone mets which will require dental clearance from their office.  Pam stated OK but failed to provide this nurse the fax number after requesting their fax.  Call ended.  This nurse will call Pam again to obtain fax number.

## 2023-07-27 ENCOUNTER — Encounter: Payer: Self-pay | Admitting: Urology

## 2023-07-28 ENCOUNTER — Other Ambulatory Visit: Payer: Self-pay | Admitting: Hematology

## 2023-07-28 ENCOUNTER — Other Ambulatory Visit: Payer: Self-pay

## 2023-07-28 DIAGNOSIS — C61 Malignant neoplasm of prostate: Secondary | ICD-10-CM

## 2023-07-28 MED ORDER — ONDANSETRON HCL 8 MG PO TABS
8.0000 mg | ORAL_TABLET | Freq: Three times a day (TID) | ORAL | 1 refills | Status: DC | PRN
Start: 2023-07-28 — End: 2024-03-18
  Filled 2023-07-28: qty 30, 10d supply, fill #0

## 2023-07-28 MED ORDER — LIDOCAINE-PRILOCAINE 2.5-2.5 % EX CREA
TOPICAL_CREAM | CUTANEOUS | 3 refills | Status: DC
Start: 2023-07-28 — End: 2024-03-18
  Filled 2023-07-28: qty 30, 30d supply, fill #0
  Filled 2024-01-19: qty 30, 30d supply, fill #1

## 2023-07-28 MED ORDER — PROCHLORPERAZINE MALEATE 10 MG PO TABS
10.0000 mg | ORAL_TABLET | Freq: Four times a day (QID) | ORAL | 1 refills | Status: DC | PRN
Start: 2023-07-28 — End: 2024-03-18
  Filled 2023-07-28: qty 30, 8d supply, fill #0

## 2023-07-29 NOTE — Progress Notes (Signed)
Pharmacist Chemotherapy Monitoring - Initial Assessment    Anticipated start date: 08/05/23   The following has been reviewed per standard work regarding the patient's treatment regimen: The patient's diagnosis, treatment plan and drug doses, and organ/hematologic function Lab orders and baseline tests specific to treatment regimen  The treatment plan start date, drug sequencing, and pre-medications Prior authorization status  Patient's documented medication list, including drug-drug interaction screen and prescriptions for anti-emetics and supportive care specific to the treatment regimen The drug concentrations, fluid compatibility, administration routes, and timing of the medications to be used The patient's access for treatment and lifetime cumulative dose history, if applicable  The patient's medication allergies and previous infusion related reactions, if applicable   Changes made to treatment plan:  N/A  Follow up needed:  N/A   Janeice Robinson, RPH, 07/29/2023  3:36 PM

## 2023-07-30 ENCOUNTER — Encounter: Payer: Self-pay | Admitting: Hematology

## 2023-07-30 ENCOUNTER — Other Ambulatory Visit: Payer: Self-pay

## 2023-07-30 ENCOUNTER — Other Ambulatory Visit: Payer: Self-pay | Admitting: *Deleted

## 2023-07-30 DIAGNOSIS — C778 Secondary and unspecified malignant neoplasm of lymph nodes of multiple regions: Secondary | ICD-10-CM

## 2023-07-30 DIAGNOSIS — C61 Malignant neoplasm of prostate: Secondary | ICD-10-CM

## 2023-07-30 MED ORDER — PREDNISONE 5 MG PO TABS
5.0000 mg | ORAL_TABLET | Freq: Two times a day (BID) | ORAL | 11 refills | Status: AC
Start: 2023-07-30 — End: ?
  Filled 2023-07-30: qty 60, 30d supply, fill #0
  Filled 2023-09-03: qty 60, 30d supply, fill #1
  Filled 2023-10-12: qty 60, 30d supply, fill #2
  Filled 2023-11-12: qty 60, 30d supply, fill #3
  Filled 2023-12-15: qty 60, 30d supply, fill #4
  Filled 2024-01-19: qty 60, 30d supply, fill #5
  Filled 2024-02-17: qty 60, 30d supply, fill #6
  Filled 2024-03-17: qty 60, 30d supply, fill #7

## 2023-07-31 ENCOUNTER — Inpatient Hospital Stay: Payer: Medicaid Other | Attending: Nurse Practitioner

## 2023-07-31 ENCOUNTER — Encounter (HOSPITAL_COMMUNITY): Payer: Self-pay | Admitting: Hematology

## 2023-07-31 ENCOUNTER — Other Ambulatory Visit (HOSPITAL_BASED_OUTPATIENT_CLINIC_OR_DEPARTMENT_OTHER): Payer: Self-pay

## 2023-07-31 ENCOUNTER — Other Ambulatory Visit: Payer: Self-pay

## 2023-07-31 ENCOUNTER — Encounter: Payer: Self-pay | Admitting: Hematology

## 2023-07-31 ENCOUNTER — Other Ambulatory Visit (HOSPITAL_COMMUNITY): Payer: Self-pay

## 2023-07-31 DIAGNOSIS — Z5189 Encounter for other specified aftercare: Secondary | ICD-10-CM | POA: Insufficient documentation

## 2023-07-31 DIAGNOSIS — Z5111 Encounter for antineoplastic chemotherapy: Secondary | ICD-10-CM | POA: Insufficient documentation

## 2023-07-31 DIAGNOSIS — C778 Secondary and unspecified malignant neoplasm of lymph nodes of multiple regions: Secondary | ICD-10-CM | POA: Insufficient documentation

## 2023-07-31 DIAGNOSIS — C7951 Secondary malignant neoplasm of bone: Secondary | ICD-10-CM | POA: Insufficient documentation

## 2023-07-31 DIAGNOSIS — C61 Malignant neoplasm of prostate: Secondary | ICD-10-CM | POA: Insufficient documentation

## 2023-08-04 ENCOUNTER — Telehealth: Payer: Self-pay

## 2023-08-04 ENCOUNTER — Other Ambulatory Visit: Payer: Self-pay

## 2023-08-04 MED FILL — Dexamethasone Sodium Phosphate Inj 100 MG/10ML: INTRAMUSCULAR | Qty: 1 | Status: AC

## 2023-08-04 NOTE — Telephone Encounter (Signed)
Spoke with pt via telephone regarding Prednisone prescription.  Pt wanted to know when he was suppose to start taking the Prednisone.  Informed pt that he's to start the Prednisone 1 day before he gets IV Chemo.  Pt verbalized understanding and had no further questions or concerns.

## 2023-08-05 ENCOUNTER — Inpatient Hospital Stay: Payer: Medicaid Other

## 2023-08-05 ENCOUNTER — Inpatient Hospital Stay (HOSPITAL_BASED_OUTPATIENT_CLINIC_OR_DEPARTMENT_OTHER): Payer: Medicaid Other | Admitting: Hematology

## 2023-08-05 ENCOUNTER — Other Ambulatory Visit: Payer: Self-pay

## 2023-08-05 ENCOUNTER — Encounter: Payer: Self-pay | Admitting: Hematology

## 2023-08-05 ENCOUNTER — Other Ambulatory Visit: Payer: Self-pay | Admitting: Radiology

## 2023-08-05 VITALS — BP 134/89 | HR 92 | Temp 99.2°F | Resp 18 | Ht 70.0 in | Wt 245.3 lb

## 2023-08-05 VITALS — BP 137/89 | HR 92 | Temp 99.5°F | Resp 13

## 2023-08-05 DIAGNOSIS — C778 Secondary and unspecified malignant neoplasm of lymph nodes of multiple regions: Secondary | ICD-10-CM

## 2023-08-05 DIAGNOSIS — C7951 Secondary malignant neoplasm of bone: Secondary | ICD-10-CM

## 2023-08-05 DIAGNOSIS — C61 Malignant neoplasm of prostate: Secondary | ICD-10-CM

## 2023-08-05 DIAGNOSIS — Z5189 Encounter for other specified aftercare: Secondary | ICD-10-CM | POA: Diagnosis not present

## 2023-08-05 DIAGNOSIS — Z5111 Encounter for antineoplastic chemotherapy: Secondary | ICD-10-CM | POA: Diagnosis not present

## 2023-08-05 LAB — CBC WITH DIFFERENTIAL (CANCER CENTER ONLY)
Abs Immature Granulocytes: 0.01 10*3/uL (ref 0.00–0.07)
Basophils Absolute: 0 10*3/uL (ref 0.0–0.1)
Basophils Relative: 1 %
Eosinophils Absolute: 0.1 10*3/uL (ref 0.0–0.5)
Eosinophils Relative: 4 %
HCT: 32.4 % — ABNORMAL LOW (ref 39.0–52.0)
Hemoglobin: 11 g/dL — ABNORMAL LOW (ref 13.0–17.0)
Immature Granulocytes: 0 %
Lymphocytes Relative: 23 %
Lymphs Abs: 0.9 10*3/uL (ref 0.7–4.0)
MCH: 28.8 pg (ref 26.0–34.0)
MCHC: 34 g/dL (ref 30.0–36.0)
MCV: 84.8 fL (ref 80.0–100.0)
Monocytes Absolute: 0.4 10*3/uL (ref 0.1–1.0)
Monocytes Relative: 11 %
Neutro Abs: 2.5 10*3/uL (ref 1.7–7.7)
Neutrophils Relative %: 61 %
Platelet Count: 265 10*3/uL (ref 150–400)
RBC: 3.82 MIL/uL — ABNORMAL LOW (ref 4.22–5.81)
RDW: 13.5 % (ref 11.5–15.5)
WBC Count: 4 10*3/uL (ref 4.0–10.5)
nRBC: 0 % (ref 0.0–0.2)

## 2023-08-05 LAB — CMP (CANCER CENTER ONLY)
ALT: 11 U/L (ref 0–44)
AST: 12 U/L — ABNORMAL LOW (ref 15–41)
Albumin: 4.1 g/dL (ref 3.5–5.0)
Alkaline Phosphatase: 411 U/L — ABNORMAL HIGH (ref 38–126)
Anion gap: 7 (ref 5–15)
BUN: 16 mg/dL (ref 6–20)
CO2: 27 mmol/L (ref 22–32)
Calcium: 9.8 mg/dL (ref 8.9–10.3)
Chloride: 105 mmol/L (ref 98–111)
Creatinine: 1.12 mg/dL (ref 0.61–1.24)
GFR, Estimated: >60 mL/min (ref >60)
Glucose, Bld: 115 mg/dL — ABNORMAL HIGH (ref 70–99)
Potassium: 3.7 mmol/L (ref 3.5–5.1)
Sodium: 139 mmol/L (ref 135–145)
Total Bilirubin: 0.4 mg/dL (ref 0.3–1.2)
Total Protein: 8 g/dL (ref 6.5–8.1)

## 2023-08-05 MED ORDER — SODIUM CHLORIDE 0.9 % IV SOLN: Freq: Once | INTRAVENOUS | Status: AC

## 2023-08-05 MED ORDER — SODIUM CHLORIDE 0.9 % IV SOLN
75.0000 mg/m2 | Freq: Once | INTRAVENOUS | Status: AC
  Administered 2023-08-05: 176 mg via INTRAVENOUS
  Filled 2023-08-05: qty 17.6

## 2023-08-05 MED ORDER — SODIUM CHLORIDE 0.9 % IV SOLN
10.0000 mg | Freq: Once | INTRAVENOUS | Status: AC
  Administered 2023-08-05: 10 mg via INTRAVENOUS
  Filled 2023-08-05: qty 10

## 2023-08-05 NOTE — Consult Note (Signed)
Chief Complaint: Patient was seen in consultation today for port a cath placement  Referring Physician(s): Feng,Yan  Supervising Physician: Richarda Overlie  Patient Status: [facility] - Out-pt  History of Present Illness: Craig Collins is a 57 y.o. male with PMH sig for HTN, obesity, sleep apnea, ED and stage IV prostate cancer (initially diagnosed 2020) with recent PET scan showing increase in size of bony mets. He is scheduled today for port a cath placement to assist with treatment.   Past Medical History:  Diagnosis Date   Benign prostate hyperplasia    Elevated PSA    Erectile dysfunction    Hypertension    Obesity (BMI 35.0-39.9 without comorbidity)    Prostate cancer (HCC) 05/2019   Sleep apnea    Urinary retention     Past Surgical History:  Procedure Laterality Date   COLONOSCOPY N/A 10/14/2017   Procedure: COLONOSCOPY;  Surgeon: Iva Boop, MD;  Location: [facility] ENDOSCOPY;  Service: Endoscopy;  Laterality: N/A;   ESOPHAGOGASTRODUODENOSCOPY (EGD) WITH PROPOFOL N/A 08/04/2022   Procedure: ESOPHAGOGASTRODUODENOSCOPY (EGD) WITH PROPOFOL;  Surgeon: Charna Elizabeth, MD;  Location: [facility] ENDOSCOPY;  Service: Gastroenterology;  Laterality: N/A;    Allergies: Patient has no known allergies.  Medications: Prior to Admission medications   Medication Sig Start Date End Date Taking? Authorizing Provider  albuterol (VENTOLIN HFA) 108 (90 Base) MCG/ACT inhaler Inhale 2 puffs into the lungs every 6 (six) hours as needed for wheezing or shortness of breath. 03/03/23   Omar Person, MD  aspirin EC 81 MG tablet Take 81 mg by mouth daily.    [provider]  Calcium Carbonate-Vitamin D (CALCIUM 500/D) 500-125 MG-UNIT TABS Take 1 tablet by mouth daily.    [provider]  cyanocobalamin 1000 MCG tablet Take 2 tablets (2,000 mcg total) by mouth daily. 06/17/23   Pickenpack-Cousar, Arty Baumgartner, NP  enzalutamide Diana Eves) 40 MG capsule Take 4 capsules (160 mg total) by mouth  daily. 07/08/22   Benjiman Core, MD  fluticasone (FLONASE) 50 MCG/ACT nasal spray Place 2 sprays into both nostrils daily. 01/19/23   Donell Beers, FNP  hydrochlorothiazide (HYDRODIURIL) 25 MG tablet TAKE 1/2 TABLET (12.5 MG TOTAL) BY MOUTH DAILY. 01/23/23   Donell Beers, FNP  lidocaine-prilocaine (EMLA) cream Apply to affected area once 07/28/23   Malachy Mood, MD  lisinopril (ZESTRIL) 20 MG tablet TAKE 1 TABLET (20 MG TOTAL) BY MOUTH DAILY. 08/27/22 09/17/23  Ivonne Andrew, NP  ondansetron (ZOFRAN) 8 MG tablet Take 1 tablet (8 mg total) by mouth every 8 (eight) hours as needed for nausea or vomiting. 07/28/23   Malachy Mood, MD  oxyCODONE ER [facility]) 9 MG C12A Take 9 mg by mouth every 12 (twelve) hours. 07/08/23   Pickenpack-Cousar, Arty Baumgartner, NP  predniSONE (DELTASONE) 5 MG tablet Take 1 tablet (5 mg total) by mouth in the morning and at bedtime. 07/30/23   Malachy Mood, MD  prochlorperazine (COMPAZINE) 10 MG tablet Take 1 tablet (10 mg total) by mouth every 6 (six) hours as needed for nausea or vomiting. 07/28/23   Malachy Mood, MD  sodium chloride (OCEAN) 0.65 % SOLN nasal spray Place 1 spray into both nostrils as needed for congestion. 01/19/23   Donell Beers, FNP  tadalafil (CIALIS) 20 MG tablet Take 1 tablet (20 mg total) by mouth daily as needed. 03/18/23     tamsulosin (FLOMAX) 0.4 MG CAPS capsule TAKE 1 CAPSULE (0.4 MG TOTAL) BY MOUTH DAILY. 10/02/22 10/02/23  Tanda Rockers,  Gildardo Pounds, NP  traMADol (ULTRAM) 50 MG tablet Take 1 - 2 tablets (50 - 100 mg total) by mouth every 6 hours as needed for moderate pain or severe pain. 05/19/23   Pickenpack-Cousar, Arty Baumgartner, NP  VIAGRA 25 MG tablet Take 1 tablet (25 mg total) by mouth daily as needed for erectile dysfunction. 10/22/22   Ivonne Andrew, NP  XTANDI 40 MG tablet Take 4 tablets (160mg ) by mouth once daily as directed by physician. 11/24/22   Benjiman Core, MD     Family History  Problem Relation Age of Onset   Leukemia Mother 65 - 11    Hypertension Brother    Prostate cancer Maternal Uncle    Prostate cancer Maternal Grandfather    Hypertension Other    Prostate cancer Cousin        maternal first cousin   Colon cancer Neg Hx     Social History   Socioeconomic History   Marital status: Married    Spouse name: Not on file   Number of children: 2   Years of education: Not on file   Highest education level: Not on file  Occupational History   Not on file  Tobacco Use   Smoking status: Never   Smokeless tobacco: Never  Vaping Use   Vaping status: Never Used  Substance and Sexual Activity   Alcohol use: Not Currently   Drug use: No   Sexual activity: Yes    Birth control/protection: Condom  Other Topics Concern   Not on file  Social History Narrative   Not on file   Social Determinants of Health   Financial Resource Strain: Not on file  Food Insecurity: Not on file  Transportation Needs: Not on file  Physical Activity: Not on file  Stress: Not on file  Social Connections: Not on file      Review of Systems: denies fever,HA,CP,dyspnea, cough, abd pain, N/V or bleeding; he does low back pain and is anxious  Vital Signs: Vitals:   08/06/23 1120  BP: (!) 137/93  Resp: 14  Temp: 98.7 F (37.1 C)       Code Status: FULL CODE    Physical Exam: awake/alert; chest- CTA bilat; heart- RRR; abd- soft,+BS,NT; no sig LE edema  Imaging: NM PET (PSMA) SKULL TO MID THIGH  Result Date: 07/22/2023 CLINICAL DATA:  Restaging metastatic prostate cancer. EXAM: NUCLEAR MEDICINE PET SKULL BASE TO THIGH TECHNIQUE: 9.0 mCi F18 Piflufolastat (Pylarify) was injected intravenously. Full-ring PET imaging was performed from the skull base to thigh after the radiotracer. CT data was obtained and used for attenuation correction and anatomic localization. COMPARISON:  PET-CT 06/10/2022 FINDINGS: NECK No radiotracer activity in neck lymph nodes. Incidental CT finding: None. CHEST There has been interval improvement in  previous tracer avid thoracic adenopathy. Index left supraclavicular lymph node measures 1.3 cm with SUV max of 11.31, image 71/4. On the previous PET-CT this measured 3.7 cm with SUV max of 60.4. Previous index subcarinal lymph node measures 0.6 cm with SUV max 2.15, image 90/4. On the previous exam this measured 2.1 cm with SUV max 20.4. No tracer avid pulmonary nodules. Incidental CT finding: None. ABDOMEN/PELVIS Prostate: There is mild increased tracer uptake within the right posterior gland with SUV max 5.46, image 201 of the fused images. On the previous exam there was marked enlargement of the prostate gland with diffuse increased uptake posteriorly with SUV max of 44.06. Resolution scratch mild tracer activity within the right seminal vesicle  is improved with SUV max of 6.16. Previously 44.06. Lymph nodes: Right retrocrural lymph node measures 1 cm with SUV max of 57.07, image 120/4. Previously 1.8 cm with SUV max 33.6. Right external iliac lymph node measures 2.6 cm with SUV max 70.3, image 181/4. On the previous exam this measured 3.9 cm with SUV max of 72.9. Right retroperitoneal lymph node at the bifurcation of the aorta measures 1.8 cm with SUV max 71, image 163/4. On the previous exam this measured 3.4 cm with SUV max 37.1. Liver: No evidence of liver metastasis. Incidental CT finding: Interval decrease in size of the prostate gland. Fat containing right inguinal hernia. SKELETON Again seen are multifocal tracer avid bone metastases. The previous scratch -previous tracer avid lesion involving the right calvarium has resolved in the interval. -tracer avid lesion involving the right sacral wing measures approximately 5.2 cm with SUV max of 80.83, image 171 of the fused PET-CT images. Previously this lesion measured 2.5 cm with SUV max 42.89. -Diffuse tracer uptake throughout the L5 vertebral body has increased in size from previous exam with SUV max of 91.56. Formally the SUV max was equal to 69.56.  -Tracer avid lesion involving the proximal left femur appears increased in size with SUV max of 67.8. Previously 49.8. -Tracer avid lesion within the right femoral neck is also increased in size with SUV max of 51.24. Previously 8.8. IMPRESSION: 1. Mixed response to therapy. 2. Interval decrease in size of the prostate gland with mild residual tracer uptake within the right posterior gland. 3. Interval decrease in size of thoracic and abdominal adenopathy. 4. Persistent, multifocal tracer avid bone metastases. Overall, the tracer avid lesions appear increased in size and degree of tracer uptake. Lesion within the right calvarium has resolved in the interval. Electronically Signed   By: Signa Kell M.D.   On: 07/22/2023 12:41    Labs:  CBC: Recent Labs    10/22/22 1548 11/21/22 1459 12/16/22 0751 07/21/23 1036  WBC 3.9 3.9 3.4* 4.2  HGB 10.8* 11.6* 10.8* 9.8*  HCT 32.0* 34.2* 32.9* 29.2*  PLT 275 292 246 321    COAGS: No results for input(s): "INR", "APTT" in the last 8760 hours.  BMP: Recent Labs    10/11/22 1010 10/16/22 0840 10/22/22 1548 12/16/22 0751 07/21/23 1036  NA 138 137 142 141 140  K 3.6 3.3* 3.9 3.9 3.4*  CL 108 105 107* 107 106  CO2 22 22 20 26 24   GLUCOSE 101* 128* 100* 98 111*  BUN 18 22* 19 23* 15  CALCIUM 9.6 9.7 9.2 10.0 9.9  CREATININE 1.26* 1.35* 1.34* 1.40* 1.09  GFRNONAA >60 >60  --  59* >60    LIVER FUNCTION TESTS: Recent Labs    10/16/22 0840 10/22/22 1548 12/16/22 0751 07/21/23 1036  BILITOT 0.3 <0.2 0.3 0.4  AST 10* 6 10* 8*  ALT 9 7 8 7   ALKPHOS 186* 232* 158* 313*  PROT 7.5 6.6 7.5 7.8  ALBUMIN 4.0 3.9 4.0 3.7    TUMOR MARKERS: No results for input(s): "AFPTM", "CEA", "CA199", "CHROMGRNA" in the last 8760 hours.  Assessment and Plan: 57 y.o. male with PMH sig for HTN, obesity, sleep apnea, ED and stage IV prostate cancer (initially diagnosed 2020) with recent PET scan showing increase in size of bony mets. He is scheduled today  for port a cath placement to assist with treatment. Risks and benefits of image guided port-a-catheter placement was discussed with the patient including, but not limited to bleeding,  infection, pneumothorax, or fibrin sheath development and need for additional procedures.  All of the patient's questions were answered, patient is agreeable to proceed. Consent signed and in chart.    Thank you for this interesting consult.  I greatly enjoyed meeting MAXSIM BANGERTER and look forward to participating in their care.  A copy of this report was sent to the requesting provider on this date.  Electronically Signed: D. Jeananne Rama, PA-C 08/05/2023, 10:56 AM   I spent a total of  25 minutes   in face to face in clinical consultation, greater than 50% of which was counseling/coordinating care for port a cath placement

## 2023-08-05 NOTE — Assessment & Plan Note (Signed)
-  Due to extensive bone metastasis, he is at high risk for fracture -Continue vitamin D and calcium supplement -I discussed the benefit of Xgeva or Zometa.  He still has dental work to complete, he is scheduled for deep cleaning in November, I will call his dental office to see if they can move this up.

## 2023-08-05 NOTE — Progress Notes (Signed)
[facility]   [phone] [phone]   Clinic Follow up Note   Patient Care Team: Ivonne Andrew, NP as PCP - General (Pulmonary Disease) Cherlyn Cushing, RN as Oncology Nurse Navigator Malachy Mood, MD as Consulting Physician (Hematology and Oncology)  Date of Service:  08/05/2023  CHIEF COMPLAINT: f/u of prostate cancer   CURRENT THERAPY:  Docetaxel (75) + Prednisone q21d  ASSESSMENT:  Craig Collins is a 57 y.o. male with   Prostate cancer (HCC) -Staging IV with node metastasis, castration resistant. -Diagnosed in 2020, initially presented with Gleason score 9 and PSA 42 -he started ADT in 2020, initially responded with PSA dropped to 2.77 in November 2021.  PSA started rising and went to 9.3 in November 2022.  Patient was not very compliant with his treatment. -He received palliative radiation to the left chest wall and left supraclavicular nodes in 2023. -He started Xtandi 160 mg daily in July 2023. He responded well and PSA has been less than 1 since then. -During his recent visit with [facility] Dr.Dr. Mena Goes on 06/17/2023, he was found to have elevated PSA 5.73, and he has developed low back pain and bilateral hip pain, concerning for disease progression. -He underwent restaging PSMA PET scan on July 16, 2023.  The scan has not been read by radiologist.  I personally reviewed the the scan images and compared to his previous PET scan from 2023, which showed mixed response, with worsening bone metastasis in pelvis and bilateral hip, but the improvement in the adenopathy and bone mets in lower neck and upper chest, probably partially related to previous radiation. -We reviewed the next line treatment options, including chemotherapy, Pluvicto, and targeted therapy depending on his genetic testing results.  His previously genetic testing was negative.  I will request next generation sequencing on his initial prostate biopsy sample. -his PET scan from July 21, 2023 showed mixed response, but overall worsening bone metastasis.  I recommended changing treatment to chemotherapy docetaxel every 3 weeks, for up to 10 cycles. -Goal of chemotherapy is palliative, for disease control and prolong his life. -Plan to start chemo today, I reviewed the potential side effects from docetaxel, and the management, he voiced good understanding and agrees to proceed.   Metastasis to bone (HCC) -Due to extensive bone metastasis, he is at high risk for fracture -Continue vitamin D and calcium supplement -I discussed the benefit of Xgeva or Zometa.  He still has dental work to complete, he is scheduled for deep cleaning in November, I will call his dental office to see if they can move this up.    PLAN: -lab reviewed -Discussing Treatment side effects -recommend pt to monitor BP at home -proceed with D1,C1 TAXOTERE today -lab and f/u in 1 week  SUMMARY OF ONCOLOGIC HISTORY: Oncology History Overview Note   Cancer Staging  Prostate cancer [facility]) Staging form: Prostate, AJCC 8th Edition - Clinical: Stage IVB (cTX, cNX, pM1b, PSA: 42, Grade Group: 5) - Signed by Benjiman Core, MD on 07/08/2022 Prostate specific antigen (PSA) range: 20 or greater Histologic grading system: 5 grade system     Prostate cancer (HCC)  12/21/2019 Initial Diagnosis   Prostate cancer (HCC)   07/08/2022 Cancer Staging   Staging form: Prostate, AJCC 8th Edition - Clinical: Stage IVB (cTX, cNX, pM1b, PSA: 42, Grade Group: 5) - Signed by Benjiman Core, MD on 07/08/2022 Prostate specific antigen (PSA) range: 20 or greater Histologic grading system: 5 grade system  Malignant neoplasm of prostate metastatic to lymph nodes of multiple sites [facility])  07/08/2022 Initial Diagnosis   Malignant neoplasm of prostate metastatic to lymph nodes of multiple sites [facility])   08/05/2023 -  Chemotherapy   Patient is on Treatment Plan : PROSTATE Docetaxel (75) + Prednisone q21d        INTERVAL  HISTORY:  RILO TRAGER is here for a follow up of prostate cancer . He was last seen by me on 07/21/2023. He presents to the clinic accompanied by wife. Pt state he is doing well and is ready to start treatment today. Pt state that he has lower back pain.     All other systems were reviewed with the patient and are negative.  MEDICAL HISTORY:  Past Medical History:  Diagnosis Date   Benign prostate hyperplasia    Elevated PSA    Erectile dysfunction    Hypertension    Obesity (BMI 35.0-39.9 without comorbidity)    Prostate cancer (HCC) 05/2019   Sleep apnea    Urinary retention     SURGICAL HISTORY: Past Surgical History:  Procedure Laterality Date   COLONOSCOPY N/A 10/14/2017   Procedure: COLONOSCOPY;  Surgeon: Iva Boop, MD;  Location: [facility] ENDOSCOPY;  Service: Endoscopy;  Laterality: N/A;   ESOPHAGOGASTRODUODENOSCOPY (EGD) WITH PROPOFOL N/A 08/04/2022   Procedure: ESOPHAGOGASTRODUODENOSCOPY (EGD) WITH PROPOFOL;  Surgeon: Charna Elizabeth, MD;  Location: [facility] ENDOSCOPY;  Service: Gastroenterology;  Laterality: N/A;    I have reviewed the social history and family history with the patient and they are unchanged from previous note.  ALLERGIES:  has No Known Allergies.  MEDICATIONS:  Current Outpatient Medications  Medication Sig Dispense Refill   albuterol (VENTOLIN HFA) 108 (90 Base) MCG/ACT inhaler Inhale 2 puffs into the lungs every 6 (six) hours as needed for wheezing or shortness of breath. 18 g 6   aspirin EC 81 MG tablet Take 81 mg by mouth daily.     Calcium Carbonate-Vitamin D (CALCIUM 500/D) 500-125 MG-UNIT TABS Take 1 tablet by mouth daily.     cyanocobalamin 1000 MCG tablet Take 2 tablets (2,000 mcg total) by mouth daily. 30 tablet 0   fluticasone (FLONASE) 50 MCG/ACT nasal spray Place 2 sprays into both nostrils daily. 16 g 6   hydrochlorothiazide (HYDRODIURIL) 25 MG tablet TAKE 1/2 TABLET (12.5 MG TOTAL) BY MOUTH DAILY. 45 tablet 3   lidocaine-prilocaine (EMLA)  cream Apply to affected area once 30 g 3   lisinopril (ZESTRIL) 20 MG tablet TAKE 1 TABLET (20 MG TOTAL) BY MOUTH DAILY. 90 tablet 3   ondansetron (ZOFRAN) 8 MG tablet Take 1 tablet (8 mg total) by mouth every 8 (eight) hours as needed for nausea or vomiting. 30 tablet 1   oxyCODONE ER (XTAMPZA ER) 9 MG C12A Take 9 mg by mouth every 12 (twelve) hours. 60 capsule 0   predniSONE (DELTASONE) 5 MG tablet Take 1 tablet (5 mg total) by mouth in the morning and at bedtime. 60 tablet 11   prochlorperazine (COMPAZINE) 10 MG tablet Take 1 tablet (10 mg total) by mouth every 6 (six) hours as needed for nausea or vomiting. 30 tablet 1   sodium chloride (OCEAN) 0.65 % SOLN nasal spray Place 1 spray into both nostrils as needed for congestion. 30 mL 0   tadalafil (CIALIS) 20 MG tablet Take 1 tablet (20 mg total) by mouth daily as needed. 10 tablet 11   tamsulosin (FLOMAX) 0.4 MG CAPS capsule TAKE 1 CAPSULE (0.4 MG TOTAL) BY MOUTH  DAILY. 30 capsule 11   traMADol (ULTRAM) 50 MG tablet Take 1 - 2 tablets (50 - 100 mg total) by mouth every 6 hours as needed for moderate pain or severe pain. 90 tablet 0   VIAGRA 25 MG tablet Take 1 tablet (25 mg total) by mouth daily as needed for erectile dysfunction. 10 tablet 0   No current facility-administered medications for this visit.    PHYSICAL EXAMINATION: ECOG PERFORMANCE STATUS: 1 - Symptomatic but completely ambulatory  Vitals:   08/05/23 1126  BP: 134/89  Pulse: 92  Resp: 18  Temp: 99.2 F (37.3 C)  SpO2: 96%   Wt Readings from Last 3 Encounters:  08/05/23 245 lb 4.8 oz (111.3 kg)  07/21/23 246 lb 4.8 oz (111.7 kg)  06/17/23 258 lb (117 kg)     GENERAL:alert, no distress and comfortable SKIN: skin color normal, no rashes or significant lesions EYES: normal, Conjunctiva are pink and non-injected, sclera clear  NEURO: alert & oriented x 3 with fluent speech  LABORATORY DATA:  I have reviewed the data as listed    Latest Ref Rng & Units 08/05/2023    11:06 AM 07/21/2023   10:36 AM 12/16/2022    7:51 AM  CBC  WBC 4.0 - 10.5 K/uL 4.0  4.2  3.4   Hemoglobin 13.0 - 17.0 g/dL 29.5  9.8  62.1   Hematocrit 39.0 - 52.0 % 32.4  29.2  32.9   Platelets 150 - 400 K/uL 265  321  246         Latest Ref Rng & Units 08/05/2023   11:06 AM 07/21/2023   10:36 AM 12/16/2022    7:51 AM  CMP  Glucose 70 - 99 mg/dL 308  657  98   BUN 6 - 20 mg/dL 16  15  23    Creatinine 0.61 - 1.24 mg/dL 8.46  9.62  9.52   Sodium 135 - 145 mmol/L 139  140  141   Potassium 3.5 - 5.1 mmol/L 3.7  3.4  3.9   Chloride 98 - 111 mmol/L 105  106  107   CO2 22 - 32 mmol/L 27  24  26    Calcium 8.9 - 10.3 mg/dL 9.8  9.9  84.1   Total Protein 6.5 - 8.1 g/dL 8.0  7.8  7.5   Total Bilirubin 0.3 - 1.2 mg/dL 0.4  0.4  0.3   Alkaline Phos 38 - 126 U/L 411  313  158   AST 15 - 41 U/L 12  8  10    ALT 0 - 44 U/L 11  7  8        RADIOGRAPHIC STUDIES: I have personally reviewed the radiological images as listed and agreed with the findings in the report. No results found.    No orders of the defined types were placed in this encounter.  All questions were answered. The patient knows to call the clinic with any problems, questions or concerns. No barriers to learning was detected. The total time spent in the appointment was 25 minutes.     Malachy Mood, MD 08/05/2023   Carolin Coy, CMA, am acting as scribe for Malachy Mood, MD.   I have reviewed the above documentation for accuracy and completeness, and I agree with the above.

## 2023-08-05 NOTE — Patient Instructions (Signed)
[facility] [facility]  Discharge Instructions: Thank you for choosing [facility] to provide your oncology and hematology care.   If you have a lab appointment with the Cancer Center, please go directly to the Cancer Center and check in at the registration area.   Wear comfortable clothing and clothing appropriate for easy access to any Portacath or PICC line.   We strive to give you quality time with your provider. You may need to reschedule your appointment if you arrive late (15 or more minutes).  Arriving late affects you and other patients whose appointments are after yours.  Also, if you miss three or more appointments without notifying the office, you may be dismissed from the clinic at the provider's discretion.      For prescription refill requests, have your pharmacy contact our office and allow 72 hours for refills to be completed.    Today you received the following chemotherapy and/or immunotherapy agents: Docetaxel      To help prevent nausea and vomiting after your treatment, we encourage you to take your nausea medication as directed.  BELOW ARE SYMPTOMS THAT SHOULD BE REPORTED IMMEDIATELY: *FEVER GREATER THAN 100.4 F (38 C) OR HIGHER *CHILLS OR SWEATING *NAUSEA AND VOMITING THAT IS NOT CONTROLLED WITH YOUR NAUSEA MEDICATION *UNUSUAL SHORTNESS OF BREATH *UNUSUAL BRUISING OR BLEEDING *URINARY PROBLEMS (pain or burning when urinating, or frequent urination) *BOWEL PROBLEMS (unusual diarrhea, constipation, pain near the anus) TENDERNESS IN MOUTH AND THROAT WITH OR WITHOUT PRESENCE OF ULCERS (sore throat, sores in mouth, or a toothache) UNUSUAL RASH, SWELLING OR PAIN  UNUSUAL VAGINAL DISCHARGE OR ITCHING   Items with * indicate a potential emergency and should be followed up as soon as possible or go to the Emergency Department if any problems should occur.  Please show the CHEMOTHERAPY ALERT CARD or IMMUNOTHERAPY ALERT CARD at  check-in to the Emergency Department and triage nurse.  Should you have questions after your visit or need to cancel or reschedule your appointment, please contact [place] [facility]  Dept: [phone]  and follow the prompts.  Office hours are 8:00 a.m. to 4:30 p.m. Monday - Friday. Please note that voicemails left after 4:00 p.m. may not be returned until the following business day.  We are closed weekends and major holidays. You have access to a nurse at all times for urgent questions. Please call the main number to the clinic Dept: [phone] and follow the prompts.   For any non-urgent questions, you may also contact your provider using MyChart. We now offer e-Visits for anyone 10 and older to request care online for non-urgent symptoms. For details visit [email or web address].   Also download the MyChart app! Go to the app store, search "MyChart", open the app, select [place], and log in with your MyChart username and password.  Docetaxel Injection What is this medication? DOCETAXEL (doe se TAX el) treats some types of cancer. It works by slowing down the growth of cancer cells. This medicine may be used for other purposes; ask your health care provider or pharmacist if you have questions. COMMON BRAND NAME(S): Docefrez, Docivyx, Taxotere What should I tell my care team before I take this medication? They need to know if you have any of these conditions: Kidney disease Liver disease Low white blood cell levels Tingling of the fingers or toes or other nerve disorder An unusual or allergic reaction to docetaxel, polysorbate 80, other medications, foods, dyes,  or preservatives Pregnant or trying to get pregnant Breast-feeding How should I use this medication? This medication is injected into a vein. It is given by your care team in a hospital or clinic setting. Talk to your care team about the use of this medication in children. Special care may be  needed. Overdosage: If you think you have taken too much of this medicine contact a poison control center or emergency room at once. NOTE: This medicine is only for you. Do not share this medicine with others. What if I miss a dose? Keep appointments for follow-up doses. It is important not to miss your dose. Call your care team if you are unable to keep an appointment. What may interact with this medication? Do not take this medication with any of the following: Live virus vaccines This medication may also interact with the following: Certain antibiotics, such as clarithromycin, telithromycin Certain antivirals for HIV or hepatitis Certain medications for fungal infections, such as itraconazole, ketoconazole, voriconazole Grapefruit juice Nefazodone Supplements, such as St. John's wort This list may not describe all possible interactions. Give your health care provider a list of all the medicines, herbs, non-prescription drugs, or dietary supplements you use. Also tell them if you smoke, drink alcohol, or use illegal drugs. Some items may interact with your medicine. What should I watch for while using this medication? This medication may make you feel generally unwell. This is not uncommon as chemotherapy can affect healthy cells as well as cancer cells. Report any side effects. Continue your course of treatment even though you feel ill unless your care team tells you to stop. You may need blood work done while you are taking this medication. This medication can cause serious side effects and infusion reactions. To reduce the risk, your care team may give you other medications to take before receiving this one. Be sure to follow the directions from your care team. This medication may increase your risk of getting an infection. Call your care team for advice if you get a fever, chills, sore throat, or other symptoms of a cold or flu. Do not treat yourself. Try to avoid being around people who  are sick. Avoid taking medications that contain aspirin, acetaminophen, ibuprofen, naproxen, or ketoprofen unless instructed by your care team. These medications may hide a fever. Be careful brushing or flossing your teeth or using a toothpick because you may get an infection or bleed more easily. If you have any dental work done, tell your dentist you are receiving this medication. Some products may contain alcohol. Ask your care team if this medication contains alcohol. Be sure to tell all care teams you are taking this medicine. Certain medications, like metronidazole and disulfiram, can cause an unpleasant reaction when taken with alcohol. The reaction includes flushing, headache, nausea, vomiting, sweating, and increased thirst. The reaction can last from 30 minutes to several hours. This medication may affect your coordination, reaction time, or judgement. Do not drive or operate machinery until you know how this medication affects you. Sit up or stand slowly to reduce the risk of dizzy or fainting spells. Drinking alcohol with this medication can increase the risk of these side effects. Talk to your care team about your risk of cancer. You may be more at risk for certain types of cancer if you take this medication. Talk to your care team if you wish to become pregnant or think you might be pregnant. This medication can cause serious birth defects if taken during  pregnancy or if you get pregnant within 2 months after stopping therapy. A negative pregnancy test is required before starting this medication. A reliable form of contraception is recommended while taking this medication and for 2 months after stopping it. Talk to your care team about reliable forms of contraception. Do not breast-feed while taking this medication and for 1 week after stopping therapy. Use a condom during sex and for 4 months after stopping therapy. Tell your care team right away if you think your partner might be pregnant.  This medication can cause serious birth defects. This medication may cause infertility. Talk to your care team if you are concerned about your fertility. What side effects may I notice from receiving this medication? Side effects that you should report to your care team as soon as possible: Allergic reactions--skin rash, itching, hives, swelling of the face, lips, tongue, or throat Change in vision such as blurry vision, seeing halos around lights, vision loss Infection--fever, chills, cough, or sore throat Infusion reactions--chest pain, shortness of breath or trouble breathing, feeling faint or lightheaded Low red blood cell level--unusual weakness or fatigue, dizziness, headache, trouble breathing Pain, tingling, or numbness in the hands or feet Painful swelling, warmth, or redness of the skin, blisters or sores at the infusion site Redness, blistering, peeling, or loosening of the skin, including inside the mouth Sudden or severe stomach pain, bloody diarrhea, fever, nausea, vomiting Swelling of the ankles, hands, or feet Tumor lysis syndrome (TLS)--nausea, vomiting, diarrhea, decrease in the amount of urine, dark urine, unusual weakness or fatigue, confusion, muscle pain or cramps, fast or irregular heartbeat, joint pain Unusual bruising or bleeding Side effects that usually do not require medical attention (report to your care team if they continue or are bothersome): Change in nail shape, thickness, or color Change in taste Hair loss Increased tears This list may not describe all possible side effects. Call your doctor for medical advice about side effects. You may report side effects to FDA at 1-800-FDA-1088. Where should I keep my medication? This medication is given in a hospital or clinic. It will not be stored at home. NOTE: This sheet is a summary. It may not cover all possible information. If you have questions about this medicine, talk to your doctor, pharmacist, or health care  provider.  2024 Elsevier/Gold Standard (2022-02-13 00:00:00)

## 2023-08-05 NOTE — Assessment & Plan Note (Signed)
-  Staging IV with node metastasis, castration resistant. -Diagnosed in 2020, initially presented with Gleason score 9 and PSA 42 -he started ADT in 2020, initially responded with PSA dropped to 2.77 in November 2021.  PSA started rising and went to 9.3 in November 2022.  Patient was not very compliant with his treatment. -He received palliative radiation to the left chest wall and left supraclavicular nodes in 2023. -He started Xtandi 160 mg daily in July 2023. He responded well and PSA has been less than 1 since then. -During his recent visit with [facility] Dr.Dr. Mena Goes on 06/17/2023, he was found to have elevated PSA 5.73, and he has developed low back pain and bilateral hip pain, concerning for disease progression. -He underwent restaging PSMA PET scan on July 16, 2023.  The scan has not been read by radiologist.  I personally reviewed the the scan images and compared to his previous PET scan from 2023, which showed mixed response, with worsening bone metastasis in pelvis and bilateral hip, but the improvement in the adenopathy and bone mets in lower neck and upper chest, probably partially related to previous radiation. -We reviewed the next line treatment options, including chemotherapy, Pluvicto, and targeted therapy depending on his genetic testing results.  His previously genetic testing was negative.  I will request next generation sequencing on his initial prostate biopsy sample. -his PET scan from July 21, 2023 showed mixed response, but overall worsening bone metastasis.  I recommended changing treatment to chemotherapy docetaxel every 3 weeks, for up to 10 cycles. -Goal of chemotherapy is palliative, for disease control and prolong his life. -Plan to start chemo today, I reviewed the potential side effects from docetaxel, and the management, he voiced good understanding and agrees to proceed.

## 2023-08-06 ENCOUNTER — Encounter (HOSPITAL_COMMUNITY): Payer: Self-pay

## 2023-08-06 ENCOUNTER — Ambulatory Visit (HOSPITAL_COMMUNITY)
Admission: RE | Admit: 2023-08-06 | Discharge: 2023-08-06 | Disposition: A | Discharge status: Home / Self Care | Payer: Medicaid Other | Source: Ambulatory Visit | Attending: Hematology | Admitting: Hematology

## 2023-08-06 ENCOUNTER — Telehealth: Payer: Self-pay

## 2023-08-06 ENCOUNTER — Other Ambulatory Visit: Payer: Self-pay

## 2023-08-06 DIAGNOSIS — N529 Male erectile dysfunction, unspecified: Secondary | ICD-10-CM | POA: Insufficient documentation

## 2023-08-06 DIAGNOSIS — I10 Essential (primary) hypertension: Secondary | ICD-10-CM | POA: Diagnosis not present

## 2023-08-06 DIAGNOSIS — E669 Obesity, unspecified: Secondary | ICD-10-CM | POA: Insufficient documentation

## 2023-08-06 DIAGNOSIS — C61 Malignant neoplasm of prostate: Secondary | ICD-10-CM | POA: Diagnosis not present

## 2023-08-06 DIAGNOSIS — C7951 Secondary malignant neoplasm of bone: Secondary | ICD-10-CM | POA: Diagnosis not present

## 2023-08-06 DIAGNOSIS — G4730 Sleep apnea, unspecified: Secondary | ICD-10-CM | POA: Diagnosis not present

## 2023-08-06 DIAGNOSIS — Z6835 Body mass index (BMI) 35.0-35.9, adult: Secondary | ICD-10-CM | POA: Diagnosis not present

## 2023-08-06 HISTORY — PX: IR IMAGING GUIDED PORT INSERTION: IMG5740

## 2023-08-06 LAB — PROSTATE-SPECIFIC AG, SERUM (LABCORP): Prostate Specific Ag, Serum: 13.8 ng/mL — ABNORMAL HIGH (ref 0.0–4.0)

## 2023-08-06 MED ORDER — MIDAZOLAM HCL 2 MG/2ML IJ SOLN
INTRAMUSCULAR | Status: AC
  Filled 2023-08-06: qty 2

## 2023-08-06 MED ORDER — LIDOCAINE-EPINEPHRINE 1 %-1:100000 IJ SOLN
INTRAMUSCULAR | Status: AC
  Filled 2023-08-06: qty 1

## 2023-08-06 MED ORDER — LIDOCAINE-EPINEPHRINE 1 %-1:100000 IJ SOLN
20.0000 mL | Freq: Once | INTRAMUSCULAR | Status: AC
  Administered 2023-08-06: 20 mL via INTRADERMAL

## 2023-08-06 MED ORDER — FENTANYL CITRATE (PF) 100 MCG/2ML IJ SOLN
INTRAMUSCULAR | Status: AC
  Filled 2023-08-06: qty 2

## 2023-08-06 MED ORDER — HEPARIN SOD (PORK) LOCK FLUSH 100 UNIT/ML IV SOLN
INTRAVENOUS | Status: AC
  Filled 2023-08-06: qty 5

## 2023-08-06 MED ORDER — FENTANYL CITRATE (PF) 100 MCG/2ML IJ SOLN
INTRAMUSCULAR | Status: AC | PRN
  Administered 2023-08-06 (×4): 50 ug via INTRAVENOUS

## 2023-08-06 MED ORDER — HEPARIN SOD (PORK) LOCK FLUSH 100 UNIT/ML IV SOLN
500.0000 [IU] | Freq: Once | INTRAVENOUS | Status: AC
  Administered 2023-08-06: 500 [IU] via INTRAVENOUS

## 2023-08-06 MED ORDER — DIPHENHYDRAMINE HCL 50 MG/ML IJ SOLN
INTRAMUSCULAR | Status: AC
  Filled 2023-08-06: qty 1

## 2023-08-06 MED ORDER — SODIUM CHLORIDE 0.9 % IV SOLN: INTRAVENOUS | Status: DC

## 2023-08-06 MED ORDER — LIDOCAINE HCL 1 % IJ SOLN
20.0000 mL | Freq: Once | INTRAMUSCULAR | Status: AC
  Administered 2023-08-06: 20 mL via INTRADERMAL

## 2023-08-06 MED ORDER — MIDAZOLAM HCL 2 MG/2ML IJ SOLN
INTRAMUSCULAR | Status: AC | PRN
  Administered 2023-08-06 (×4): 1 mg via INTRAVENOUS

## 2023-08-06 MED ORDER — LIDOCAINE HCL 1 % IJ SOLN
INTRAMUSCULAR | Status: AC
  Filled 2023-08-06: qty 20

## 2023-08-06 NOTE — Procedures (Signed)
Interventional Radiology Procedure: ° ° °Indications: Prostate cancer ° °Procedure: Port placement ° °Findings: Right jugular port, tip at SVC/RA junction ° °Complications: None °    °EBL: Minimal, less than 10 ml ° °Plan: Discharge in one hour.  Keep port site and incisions dry for at least 24 hours.   ° ° °Adam R. Henn, MD  °Pager: [phone] ° °  °

## 2023-08-06 NOTE — Discharge Instructions (Addendum)
 Please call Interventional Radiology clinic [phone] with any questions or concerns.  You may remove your dressing and shower tomorrow.  After the procedure, it is common to have: Discomfort at the port insertion site. Bruising on the skin over the port. This should improve over 3-4 days  Follow these instructions at home:  Medication: Do not use Aspirin or ibuprofen products, such as Advil or Motrin, as it may increase bleeding.  You may resume your usual medications as ordered by your doctor. If your doctor prescribed antibiotics, take them as directed. Do not stop taking them just because you feel better. You need to take the full course of antibiotics.  Eating and drinking: Drink plenty of liquids to keep your urine pale yellow You can resume your regular diet as directed by your doctor   Care of the procedure site Follow instructions from your health care provider about how to take care of your port insertion site. Make sure you: After your port is placed, you will get a manufacturer's information card. The card has information about your port. Keep this card with you at all times Make sure to remember what type of port you have Take care of the port as told by your health care provider DO NOT use EMLA cream for 2 weeks after port placement -the cream will remove surgical glue on your incision Wash your hands with soap and water before and after you change your bandage (dressing). If soap and water are not available, use hand sanitizer Change your dressing as told by your health care provider Leave skin glue, or adhesive strips in place. These skin closures may need to stay in place for 2 weeks or longer Check your port insertion site every day for signs of infection. Check for: Redness, swelling, or pain Fluid or blood Warmth Pus or a bad smell  Activity Return to your normal activities as told by your health care provider. Ask your health care provider what activities  are safe for you Do not lift anything that is heavier than 10 lb (4.5 kg), or the limit that you are told, until your health care provider says that it is safe Do not take baths, swim, or use a hot tub until your health care provider approves. Take showers only. Keep all follow-up visits as told by your doctor  Contact a health care provider if: You cannot flush your port with saline as directed, or you cannot draw blood from the port You have a fever or chills You have redness, swelling, or pain around your port insertion site You have fluid or blood coming from your port insertion site Your port insertion site feels warm to the touch You have pus or a bad smell coming from the port insertion site  Get help right away if: You have chest pain or shortness of breath You have bleeding from your port that you cannot control  Moderate Conscious Sedation-Care After  This sheet gives you information about how to care for yourself after your procedure. Your health care provider may also give you more specific instructions. If you have problems or questions, contact your health care provider.  After the procedure, it is common to have: Sleepiness for several hours. Impaired judgment for several hours. Difficulty with balance. Vomiting if you eat too soon.  Follow these instructions at home:  Rest. Do not participate in activities where you could fall or become injured. Do not drive or use machinery. Do not drink alcohol. Do not take  sleeping pills or medicines that cause drowsiness. Do not make important decisions or sign legal documents. Do not take care of children on your own.  Eating and drinking Follow the diet recommended by your health care provider. Drink enough fluid to keep your urine pale yellow. If you vomit: Drink water, juice, or soup when you can drink without vomiting. Make sure you have little or no nausea before eating solid foods.  General instructions Take  over-the-counter and prescription medicines only as told by your health care provider. Have a responsible adult stay with you for the time you are told. It is important to have someone help care for you until you are awake and alert. Do not smoke. Keep all follow-up visits as told by your health care provider. This is important.  Contact a health care provider if: You are still sleepy or having trouble with balance after 24 hours. You feel light-headed. You keep feeling nauseous or you keep vomiting. You develop a rash. You have a fever. You have redness or swelling around the IV site.  Get help right away if: You have trouble breathing. You have new-onset confusion at home.  This information is not intended to replace advice given to you by your health care provider. Make sure you discuss any questions you have with your healthcare provider.

## 2023-08-06 NOTE — Telephone Encounter (Signed)
Craig Collins states that he is doing fine. He is eating, drinking, and urinating well. He has been NPO since MN for Port placement today. He knows to call the office at [phone] if he has any questions or concerns.

## 2023-08-07 ENCOUNTER — Telehealth: Payer: Self-pay | Admitting: Hematology

## 2023-08-07 ENCOUNTER — Inpatient Hospital Stay: Payer: Medicaid Other

## 2023-08-07 ENCOUNTER — Other Ambulatory Visit: Payer: Self-pay

## 2023-08-07 VITALS — BP 156/93 | HR 120 | Temp 100.1°F | Resp 18

## 2023-08-07 DIAGNOSIS — Z5189 Encounter for other specified aftercare: Secondary | ICD-10-CM | POA: Diagnosis not present

## 2023-08-07 DIAGNOSIS — C61 Malignant neoplasm of prostate: Secondary | ICD-10-CM | POA: Diagnosis not present

## 2023-08-07 DIAGNOSIS — Z5111 Encounter for antineoplastic chemotherapy: Secondary | ICD-10-CM | POA: Diagnosis not present

## 2023-08-07 DIAGNOSIS — C7951 Secondary malignant neoplasm of bone: Secondary | ICD-10-CM | POA: Diagnosis not present

## 2023-08-07 DIAGNOSIS — C778 Secondary and unspecified malignant neoplasm of lymph nodes of multiple regions: Secondary | ICD-10-CM | POA: Diagnosis not present

## 2023-08-07 MED ORDER — PEGFILGRASTIM-CBQV 6 MG/0.6ML ~~LOC~~ SOSY
6.0000 mg | PREFILLED_SYRINGE | Freq: Once | SUBCUTANEOUS | Status: AC
  Administered 2023-08-07: 6 mg via SUBCUTANEOUS
  Filled 2023-08-07: qty 0.6

## 2023-08-07 NOTE — Patient Instructions (Signed)

## 2023-08-07 NOTE — Telephone Encounter (Signed)
I received a message from patient's injection nurse today, he had temperature 100.1 when he was checked in for G-CSF injection, patient had a slightly elevated blood pressure and tachycardia with heart rate 120.  I called the patient to check on him, he received the first dose chemotherapy docetaxel 2 days ago.  I called both patient and his wife, no answers, I left patient a voice message and instructed him to check temperature tonight, and to call us if he has persistent fever or other new symptoms.  I do not anticipate that he will be neutropenic now, but if he has persistent fever, he needs to call back and go to ED.   Malachy Mood MD 08/07/2023 5:35 PM

## 2023-08-12 NOTE — Progress Notes (Addendum)
Patient Care Team: Ivonne Andrew, NP as PCP - General (Pulmonary Disease) Cherlyn Cushing, RN as Oncology Nurse Navigator Malachy Mood, MD as Consulting Physician (Hematology and Oncology)  Clinic Day:  08/13/2023  Referring physician: Ivonne Andrew, NP  ASSESSMENT & PLAN:   Assessment & Plan: Prostate cancer [facility]) -Staging IV with node metastasis, metastasis to bone, castration resistant. -Diagnosed in 2020, initially presented with Gleason score 9 and PSA 42 -he started ADT in 2020, initially responded with PSA dropped to 2.77 in November 2021.  PSA started rising and went to 9.3 in November 2022.  Patient was not very compliant with his treatment. -He received palliative radiation to the left chest wall and left supraclavicular nodes in 2023. -He started Xtandi 160 mg daily in July 2023. He responded well and PSA has been less than 1 since then. -During his recent visit with [facility] Dr.Dr. Mena Goes on 06/17/2023, he was found to have elevated PSA 5.73, and he has developed low back pain and bilateral hip pain, concerning for disease progression. -He underwent restaging PSMA PET scan on July 16, 2023, which was compared with previous PET scan from 2023. PET scan from 2023 showed mixed response, with worsening bone metastasis in pelvis and bilateral hip, but improvement in the adenopathy and bone mets in lower neck and upper chest, probably partially related to previous radiation. -His previous genetic testing was negative. Next generation sequencing on his initial prostate biopsy sample has been requested. -his PET scan from July 21, 2023 showed mixed response, but overall worsening bone metastasis.  It was recommended to change treatment to chemotherapy docetaxel every 3 weeks, for up to 10 cycles. -Goal of chemotherapy is palliative, for disease control and prolong his life. 08/05/2023 he received initial dose docetaxel and is taking prednisone daily. 08/13/2023 - he presents for follow up of  initial treatment with Docetaxel and prednisone.  -states that he has tolerated initial dose well. States that he does get moderate headache, hip, and leg pain about 20 minutes after taking claritin. This has happened daily since he received his chemotherapy and only starts after taking claritin. States that today, he has not had headache or joint/muscle pain, but has not taken claritin because he has not eaten. He was reluctant to stop taking the claritin because he thought it might be a part of the chemotherapy regimen. He states that otherwise, he has had no problems with the infusion. Denies abdominal pain, constipation, or diarrhea. States that his appetite is decent.  Denies neuropathy in fingers or toes.    Plan: Labs reviewed  -CBC showing WBC 10.0; Hgb 10.0; Hct 31.0; Plt 231; Anc 6.0 -CMP - K 3.4; glucose 116; BUN 15; Creatinine 1.05; eGFR >60; Ca 9.3; LFTs normal.    Advised him OK to discontinue use of claritin daily. Explained that this is given to help with pain management. It is not a part of the chemotherapy regimen.  -labs/flush, follow up, Cycle #2 as scheduled.  The patient understands the plans discussed today and is in agreement with them.  He knows to contact our office if he develops concerns prior to his next appointment.  I provided 25 minutes of face-to-face time during this encounter and > 50% was spent counseling as documented under my assessment and plan.    Carlean Jews, NP  [facility] [address] [place] [address] Dept: [phone] Dept Fax: [phone]   No orders  of the defined types were placed in this encounter.     CHIEF COMPLAINT:  CC: prostate cancer with metastases to bone and lymph nodes   Current Treatment:  docetaxel and prednisone every 21 days  INTERVAL HISTORY:  Craig Collins is here today for repeat clinical assessment. He denies fevers or chills. He denies pain.  His appetite is good. His weight has decreased 4 pounds over last week .  -His PET scan from July 21, 2023 showed mixed response, but overall worsening bone metastasis.  It was recommended to change treatment to chemotherapy docetaxel every 3 weeks, for up to 10 cycles. -Goal of chemotherapy is palliative, for disease control and prolong his life. 08/05/2023 - initial cycle docetaxel given  08/06/2023 - port placement -patient reports only headache, hip, and leg pain which starts about 20 minutes after taking claritin. This has occurred daily since he received his initial dose chemotherapy. He states that otherwise, he has tolerated chemotherapy well.  Advised him OK to discontinue use of claritin daily. Explained that this is given to help with pain management. It is not a part of the chemotherapy regimen.  -labs/flush, follow up, Cycle #2 as scheduled.  I have reviewed the past medical history, past surgical history, social history and family history with the patient and they are unchanged from previous note.  ALLERGIES:  has No Known Allergies.  MEDICATIONS:  Current Outpatient Medications  Medication Sig Dispense Refill   albuterol (VENTOLIN HFA) 108 (90 Base) MCG/ACT inhaler Inhale 2 puffs into the lungs every 6 (six) hours as needed for wheezing or shortness of breath. 18 g 6   ascorbic acid (VITAMIN C) 500 MG tablet Take 500 mg by mouth daily.     Calcium Carbonate-Vitamin D (CALCIUM 500/D) 500-125 MG-UNIT TABS Take 1 tablet by mouth daily.     cyanocobalamin 1000 MCG tablet Take 2 tablets (2,000 mcg total) by mouth daily. 30 tablet 0   fluticasone (FLONASE) 50 MCG/ACT nasal spray Place 2 sprays into both nostrils daily. 16 g 6   hydrochlorothiazide (HYDRODIURIL) 25 MG tablet TAKE 1/2 TABLET (12.5 MG TOTAL) BY MOUTH DAILY. 45 tablet 3   lidocaine-prilocaine (EMLA) cream Apply to affected area once 30 g 3   lisinopril (ZESTRIL) 20 MG tablet TAKE 1 TABLET (20 MG TOTAL) BY MOUTH DAILY. 90  tablet 3   ondansetron (ZOFRAN) 8 MG tablet Take 1 tablet (8 mg total) by mouth every 8 (eight) hours as needed for nausea or vomiting. 30 tablet 1   oxyCODONE ER (XTAMPZA ER) 9 MG C12A Take 9 mg by mouth every 12 (twelve) hours. 60 capsule 0   predniSONE (DELTASONE) 5 MG tablet Take 1 tablet (5 mg total) by mouth in the morning and at bedtime. 60 tablet 11   prochlorperazine (COMPAZINE) 10 MG tablet Take 1 tablet (10 mg total) by mouth every 6 (six) hours as needed for nausea or vomiting. 30 tablet 1   sodium chloride (OCEAN) 0.65 % SOLN nasal spray Place 1 spray into both nostrils as needed for congestion. 30 mL 0   tadalafil (CIALIS) 20 MG tablet Take 1 tablet (20 mg total) by mouth daily as needed. 10 tablet 11   tamsulosin (FLOMAX) 0.4 MG CAPS capsule TAKE 1 CAPSULE (0.4 MG TOTAL) BY MOUTH DAILY. 30 capsule 11   traMADol (ULTRAM) 50 MG tablet Take 1 - 2 tablets (50 - 100 mg total) by mouth every 6 hours as needed for moderate pain or severe pain. 90 tablet 0  VIAGRA 25 MG tablet Take 1 tablet (25 mg total) by mouth daily as needed for erectile dysfunction. 10 tablet 0   aspirin EC 81 MG tablet Take 81 mg by mouth daily. (Patient not taking: Reported on 08/13/2023)     enzalutamide (XTANDI) 40 MG tablet Take 160 mg by mouth daily. (Patient not taking: Reported on 08/13/2023)     No current facility-administered medications for this visit.    HISTORY OF PRESENT ILLNESS:   Oncology History Overview Note   Cancer Staging  Prostate cancer [facility]) Staging form: Prostate, AJCC 8th Edition - Clinical: Stage IVB (cTX, cNX, pM1b, PSA: 42, Grade Group: 5) - Signed by Benjiman Core, MD on 07/08/2022 Prostate specific antigen (PSA) range: 20 or greater Histologic grading system: 5 grade system     Prostate cancer (HCC)  12/21/2019 Initial Diagnosis   Prostate cancer (HCC)   07/08/2022 Cancer Staging   Staging form: Prostate, AJCC 8th Edition - Clinical: Stage IVB (cTX, cNX, pM1b, PSA: 42,  Grade Group: 5) - Signed by Benjiman Core, MD on 07/08/2022 Prostate specific antigen (PSA) range: 20 or greater Histologic grading system: 5 grade system   Malignant neoplasm of prostate metastatic to lymph nodes of multiple sites (HCC)  07/08/2022 Initial Diagnosis   Malignant neoplasm of prostate metastatic to lymph nodes of multiple sites (HCC)   08/05/2023 -  Chemotherapy   Patient is on Treatment Plan : PROSTATE Docetaxel (75) + Prednisone q21d         REVIEW OF SYSTEMS:   Constitutional: Denies fevers, chills or abnormal weight loss Eyes: Denies blurriness of vision Ears, nose, mouth, throat, and face: Denies mucositis or sore throat Respiratory: Denies cough, dyspnea or wheezes Cardiovascular: Denies palpitation, chest discomfort or lower extremity swelling Gastrointestinal:  Denies nausea, heartburn or change in bowel habits Skin: Denies abnormal skin rashes Lymphatics: Denies new lymphadenopathy or easy bruising Neurological:Denies numbness, tingling or new weaknesses. Getting headache everyday starting 20 minutes after taking claritin. Behavioral/Psych: Mood is stable, no new changes  All other systems were reviewed with the patient and are negative.   VITALS:   Today's Vitals   08/13/23 1128 08/13/23 1135  BP: (!) 130/92   Pulse: (!) 110   Resp: 17   Temp: 98.5 F (36.9 C)   TempSrc: Oral   SpO2: 99%   Weight: 241 lb 9.6 oz (109.6 kg)   PainSc:  0-No pain   Body mass index is 34.67 kg/m.   Wt Readings from Last 3 Encounters:  08/13/23 241 lb 9.6 oz (109.6 kg)  08/06/23 245 lb (111.1 kg)  08/05/23 245 lb 4.8 oz (111.3 kg)    Body mass index is 34.67 kg/m.  Performance status (ECOG): 1 - Symptomatic but completely ambulatory  PHYSICAL EXAM:   GENERAL:alert, no distress and comfortable SKIN: skin color, texture, turgor are normal, no rashes or significant lesions EYES: normal, Conjunctiva are pink and non-injected, sclera clear OROPHARYNX:no  exudate, no erythema and lips, buccal mucosa, and tongue normal  NECK: supple, thyroid normal size, non-tender, without nodularity LYMPH:  no palpable lymphadenopathy in the cervical, axillary or inguinal LUNGS: clear to auscultation and percussion with normal breathing effort HEART: regular rate & rhythm and no murmurs and no lower extremity edema ABDOMEN:abdomen soft, non-tender and normal bowel sounds Musculoskeletal:no cyanosis of digits and no clubbing  NEURO: alert & oriented x 3 with fluent speech, no focal motor/sensory deficits  LABORATORY DATA:  I have reviewed the data as listed  Component Value Date/Time   NA 137 08/13/2023 1035   NA 142 10/22/2022 1548   K 3.4 (L) 08/13/2023 1035   CL 103 08/13/2023 1035   CO2 22 08/13/2023 1035   GLUCOSE 116 (H) 08/13/2023 1035   BUN 15 08/13/2023 1035   BUN 19 10/22/2022 1548   CREATININE 1.05 08/13/2023 1035   CREATININE 1.39 (H) 10/28/2017 0958   CALCIUM 9.4 08/13/2023 1035   PROT 7.5 08/13/2023 1035   PROT 6.6 10/22/2022 1548   ALBUMIN 3.5 08/13/2023 1035   ALBUMIN 3.9 10/22/2022 1548   AST 20 08/13/2023 1035   ALT 24 08/13/2023 1035   ALKPHOS 367 (H) 08/13/2023 1035   BILITOT 0.3 08/13/2023 1035   GFRNONAA >60 08/13/2023 1035   GFRAA 65 06/18/2020 0921     Lab Results  Component Value Date   WBC 10.8 (H) 08/13/2023   NEUTROABS 6.0 08/13/2023   HGB 10.0 (L) 08/13/2023   HCT 31.0 (L) 08/13/2023   MCV 85.6 08/13/2023   PLT 231 08/13/2023       RADIOGRAPHIC STUDIES: I have personally reviewed the radiological images as listed and agreed with the findings in the report. IR IMAGING GUIDED PORT INSERTION  Result Date: 08/06/2023 INDICATION: Metastatic prostate cancer. EXAM: FLUOROSCOPIC AND ULTRASOUND GUIDED PLACEMENT OF A SUBCUTANEOUS PORT COMPARISON:  None Available. MEDICATIONS: Moderate sedation ANESTHESIA/SEDATION: Moderate (conscious) sedation was employed during this procedure. A total of Versed 4 mg and  fentanyl 200 mcg was administered intravenously at the order of the provider performing the procedure. Total intra-service moderate sedation time: 31 minutes. Patient's level of consciousness and vital signs were monitored continuously by radiology nurse throughout the procedure under the supervision of the provider performing the procedure. FLUOROSCOPY TIME:  Radiation Exposure Index (as provided by the fluoroscopic device): 6 mGy Kerma COMPLICATIONS: None immediate. PROCEDURE: The procedure, risks, benefits, and alternatives were explained to the patient. Questions regarding the procedure were encouraged and answered. The patient understands and consents to the procedure. Patient was placed supine on the interventional table. Ultrasound confirmed a patent right internal jugular vein. Ultrasound image was saved for documentation. The right chest and neck were cleaned with a skin antiseptic and a sterile drape was placed. Maximal barrier sterile technique was utilized including caps, mask, sterile gowns, sterile gloves, sterile drape, hand hygiene and skin antiseptic. The right neck was anesthetized with 1% lidocaine. Small incision was made in the right neck with a blade. Micropuncture set was placed in the right internal jugular vein with ultrasound guidance. The micropuncture wire was used for measurement purposes. The right chest was anesthetized with 1% lidocaine with epinephrine. #15 blade was used to make an incision and a subcutaneous port pocket was formed. 8 french Power Port was assembled. Subcutaneous tunnel was formed with a stiff tunneling device. The port catheter was brought through the subcutaneous tunnel. The port was placed in the subcutaneous pocket. The micropuncture set was exchanged for a peel-away sheath. The catheter was placed through the peel-away sheath and the tip was positioned at the superior cavoatrial junction. Catheter placement was confirmed with fluoroscopy. The port was accessed  and flushed with heparinized saline. The port pocket was closed using two layers of absorbable sutures and Dermabond. The vein skin site was closed using a single layer of absorbable suture and Dermabond. Sterile dressings were applied. Patient tolerated the procedure well without an immediate complication. Ultrasound and fluoroscopic images were taken and saved for this procedure. IMPRESSION: Placement of a subcutaneous power-injectable port device.  Catheter tip at the superior cavoatrial junction. Electronically Signed   By: Richarda Overlie M.D.   On: 08/06/2023 16:24   NM PET (PSMA) SKULL TO MID THIGH  Result Date: 07/22/2023 CLINICAL DATA:  Restaging metastatic prostate cancer. EXAM: NUCLEAR MEDICINE PET SKULL BASE TO THIGH TECHNIQUE: 9.0 mCi F18 Piflufolastat (Pylarify) was injected intravenously. Full-ring PET imaging was performed from the skull base to thigh after the radiotracer. CT data was obtained and used for attenuation correction and anatomic localization. COMPARISON:  PET-CT 06/10/2022 FINDINGS: NECK No radiotracer activity in neck lymph nodes. Incidental CT finding: None. CHEST There has been interval improvement in previous tracer avid thoracic adenopathy. Index left supraclavicular lymph node measures 1.3 cm with SUV max of 11.31, image 71/4. On the previous PET-CT this measured 3.7 cm with SUV max of 60.4. Previous index subcarinal lymph node measures 0.6 cm with SUV max 2.15, image 90/4. On the previous exam this measured 2.1 cm with SUV max 20.4. No tracer avid pulmonary nodules. Incidental CT finding: None. ABDOMEN/PELVIS Prostate: There is mild increased tracer uptake within the right posterior gland with SUV max 5.46, image 201 of the fused images. On the previous exam there was marked enlargement of the prostate gland with diffuse increased uptake posteriorly with SUV max of 44.06. Resolution scratch mild tracer activity within the right seminal vesicle is improved with SUV max of 6.16.  Previously 44.06. Lymph nodes: Right retrocrural lymph node measures 1 cm with SUV max of 57.07, image 120/4. Previously 1.8 cm with SUV max 33.6. Right external iliac lymph node measures 2.6 cm with SUV max 70.3, image 181/4. On the previous exam this measured 3.9 cm with SUV max of 72.9. Right retroperitoneal lymph node at the bifurcation of the aorta measures 1.8 cm with SUV max 71, image 163/4. On the previous exam this measured 3.4 cm with SUV max 37.1. Liver: No evidence of liver metastasis. Incidental CT finding: Interval decrease in size of the prostate gland. Fat containing right inguinal hernia. SKELETON Again seen are multifocal tracer avid bone metastases. The previous scratch -previous tracer avid lesion involving the right calvarium has resolved in the interval. -tracer avid lesion involving the right sacral wing measures approximately 5.2 cm with SUV max of 80.83, image 171 of the fused PET-CT images. Previously this lesion measured 2.5 cm with SUV max 42.89. -Diffuse tracer uptake throughout the L5 vertebral body has increased in size from previous exam with SUV max of 91.56. Formally the SUV max was equal to 69.56. -Tracer avid lesion involving the proximal left femur appears increased in size with SUV max of 67.8. Previously 49.8. -Tracer avid lesion within the right femoral neck is also increased in size with SUV max of 51.24. Previously 8.8. IMPRESSION: 1. Mixed response to therapy. 2. Interval decrease in size of the prostate gland with mild residual tracer uptake within the right posterior gland. 3. Interval decrease in size of thoracic and abdominal adenopathy. 4. Persistent, multifocal tracer avid bone metastases. Overall, the tracer avid lesions appear increased in size and degree of tracer uptake. Lesion within the right calvarium has resolved in the interval. Electronically Signed   By: Signa Kell M.D.   On: 07/22/2023 12:41    Addendum I have seen the patient, examined him. I agree  with the assessment and and plan and have edited the notes.   Mr. Geyman is here for toxicity checkup after first cycle of docetaxel.  He overall tolerated well, did have headaches and bone pain  after taking Claritin.  So he will not take Claritin after next G-CSF injection.  We again reviewed the potential side effect and the management, he voiced good understanding, all questions were answered.  Will see him back in 2 weeks for next cycle chemo.  I spent a total of 15 minutes for his visit today, more than 50% time on face-to-face counseling.  Malachy Mood MD 08/13/2023

## 2023-08-12 NOTE — Assessment & Plan Note (Signed)
-  Staging IV with node metastasis, metastasis to bone, castration resistant. -Diagnosed in 2020, initially presented with Gleason score 9 and PSA 42 -he started ADT in 2020, initially responded with PSA dropped to 2.77 in November 2021.  PSA started rising and went to 9.3 in November 2022.  Patient was not very compliant with his treatment. -He received palliative radiation to the left chest wall and left supraclavicular nodes in 2023. -He started Xtandi 160 mg daily in July 2023. He responded well and PSA has been less than 1 since then. -During his recent visit with [facility] Dr.Dr. Mena Goes on 06/17/2023, he was found to have elevated PSA 5.73, and he has developed low back pain and bilateral hip pain, concerning for disease progression. -He underwent restaging PSMA PET scan on July 16, 2023, which was compared with previous PET scan from 2023. PET scan from 2023 showed mixed response, with worsening bone metastasis in pelvis and bilateral hip, but improvement in the adenopathy and bone mets in lower neck and upper chest, probably partially related to previous radiation. -His previous genetic testing was negative. Next generation sequencing on his initial prostate biopsy sample has been requested. -his PET scan from July 21, 2023 showed mixed response, but overall worsening bone metastasis.  It was recommended to change treatment to chemotherapy docetaxel every 3 weeks, for up to 10 cycles. -Goal of chemotherapy is palliative, for disease control and prolong his life. 08/05/2023 he received initial dose docetaxel and is taking prednisone daily. 08/13/2023 - he presents for follow up of initial treatment with Docetaxel and prednisone.  -states that he has tolerated initial dose well. States that he does get moderate headache, hip, and leg pain about 20 minutes after taking claritin. This has happened daily since he received his chemotherapy and only starts after taking claritin. States that today, he has not had  headache or joint/muscle pain, but has not taken claritin because he has not eaten. He was reluctant to stop taking the claritin because he thought it might be a part of the chemotherapy regimen. He states that otherwise, he has had no problems with the infusion. Denies abdominal pain, constipation, or diarrhea. States that his appetite is decent.  Denies neuropathy in fingers or toes.

## 2023-08-13 ENCOUNTER — Inpatient Hospital Stay (HOSPITAL_BASED_OUTPATIENT_CLINIC_OR_DEPARTMENT_OTHER): Payer: Medicaid Other | Admitting: Nurse Practitioner

## 2023-08-13 ENCOUNTER — Inpatient Hospital Stay: Payer: Medicaid Other

## 2023-08-13 VITALS — BP 130/92 | HR 110 | Temp 98.5°F | Resp 17 | Wt 241.6 lb

## 2023-08-13 DIAGNOSIS — Z5189 Encounter for other specified aftercare: Secondary | ICD-10-CM | POA: Diagnosis not present

## 2023-08-13 DIAGNOSIS — C61 Malignant neoplasm of prostate: Secondary | ICD-10-CM

## 2023-08-13 DIAGNOSIS — C778 Secondary and unspecified malignant neoplasm of lymph nodes of multiple regions: Secondary | ICD-10-CM | POA: Diagnosis not present

## 2023-08-13 DIAGNOSIS — Z5111 Encounter for antineoplastic chemotherapy: Secondary | ICD-10-CM | POA: Diagnosis not present

## 2023-08-13 DIAGNOSIS — Z95828 Presence of other vascular implants and grafts: Secondary | ICD-10-CM

## 2023-08-13 DIAGNOSIS — C7951 Secondary malignant neoplasm of bone: Secondary | ICD-10-CM | POA: Diagnosis not present

## 2023-08-13 LAB — CBC WITH DIFFERENTIAL (CANCER CENTER ONLY)
Abs Immature Granulocytes: 1.73 10*3/uL — ABNORMAL HIGH (ref 0.00–0.07)
Basophils Absolute: 0.1 10*3/uL (ref 0.0–0.1)
Basophils Relative: 1 %
Eosinophils Absolute: 0 10*3/uL (ref 0.0–0.5)
Eosinophils Relative: 0 %
HCT: 31 % — ABNORMAL LOW (ref 39.0–52.0)
Hemoglobin: 10 g/dL — ABNORMAL LOW (ref 13.0–17.0)
Immature Granulocytes: 16 %
Lymphocytes Relative: 16 %
Lymphs Abs: 1.7 10*3/uL (ref 0.7–4.0)
MCH: 27.6 pg (ref 26.0–34.0)
MCHC: 32.3 g/dL (ref 30.0–36.0)
MCV: 85.6 fL (ref 80.0–100.0)
Monocytes Absolute: 1.2 10*3/uL — ABNORMAL HIGH (ref 0.1–1.0)
Monocytes Relative: 11 %
Neutro Abs: 6 10*3/uL (ref 1.7–7.7)
Neutrophils Relative %: 56 %
Platelet Count: 231 10*3/uL (ref 150–400)
RBC: 3.62 MIL/uL — ABNORMAL LOW (ref 4.22–5.81)
RDW: 13.4 % (ref 11.5–15.5)
Smear Review: NORMAL
WBC Count: 10.8 10*3/uL — ABNORMAL HIGH (ref 4.0–10.5)
nRBC: 0.9 % — ABNORMAL HIGH (ref 0.0–0.2)

## 2023-08-13 LAB — CMP (CANCER CENTER ONLY)
ALT: 24 U/L (ref 0–44)
AST: 20 U/L (ref 15–41)
Albumin: 3.5 g/dL (ref 3.5–5.0)
Alkaline Phosphatase: 367 U/L — ABNORMAL HIGH (ref 38–126)
Anion gap: 12 (ref 5–15)
BUN: 15 mg/dL (ref 6–20)
CO2: 22 mmol/L (ref 22–32)
Calcium: 9.4 mg/dL (ref 8.9–10.3)
Chloride: 103 mmol/L (ref 98–111)
Creatinine: 1.05 mg/dL (ref 0.61–1.24)
GFR, Estimated: >60 mL/min (ref >60)
Glucose, Bld: 116 mg/dL — ABNORMAL HIGH (ref 70–99)
Potassium: 3.4 mmol/L — ABNORMAL LOW (ref 3.5–5.1)
Sodium: 137 mmol/L (ref 135–145)
Total Bilirubin: 0.3 mg/dL (ref 0.3–1.2)
Total Protein: 7.5 g/dL (ref 6.5–8.1)

## 2023-08-13 MED ORDER — SODIUM CHLORIDE 0.9% FLUSH
10.0000 mL | Freq: Once | INTRAVENOUS | Status: AC
  Administered 2023-08-13: 10 mL

## 2023-08-13 MED ORDER — HEPARIN SOD (PORK) LOCK FLUSH 100 UNIT/ML IV SOLN
500.0000 [IU] | Freq: Once | INTRAVENOUS | Status: AC
  Administered 2023-08-13: 500 [IU]

## 2023-08-19 ENCOUNTER — Inpatient Hospital Stay: Payer: Medicaid Other | Admitting: Nurse Practitioner

## 2023-08-19 NOTE — Progress Notes (Unsigned)
Palliative Medicine [facility]  Telephone:(336) [phone] Fax:([phone]) [phone]   Name: Craig Collins Date: 08/19/2023 MRN: [id number]  DOB: [birth year]  Patient Care Team: Ivonne Andrew, NP as PCP - General (Pulmonary Disease) Cherlyn Cushing, RN as Oncology Nurse Navigator Malachy Mood, MD as Consulting Physician (Hematology and Oncology)   I connected with Craig Collins on 08/19/23 at  3:00 PM EDT by phone and verified that I am speaking with the correct person using two identifiers.   I discussed the limitations, risks, security and privacy concerns of performing an evaluation and management service by telemedicine and the availability of in-person appointments. I also discussed with the patient that there may be a patient responsible charge related to this service. The patient expressed understanding and agreed to proceed.   Other persons participating in the visit and their role in the encounter: n/a   Patient's location: home  Provider's location: [facility]   Chief Complaint: f/u of symptom management    INTERVAL HISTORY: Craig Collins is a 57 y.o. 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 Palliative ask to see for symptom and pain management and goals of care.   SOCIAL HISTORY:     reports that he has never smoked. He has never been exposed to tobacco smoke. He has never used smokeless tobacco. He reports that he does not currently use alcohol. He reports that he does not use drugs.  ADVANCE DIRECTIVES:  None on file  CODE STATUS: Full code  PAST MEDICAL HISTORY: Past Medical History:  Diagnosis Date   Benign prostate hyperplasia    Elevated PSA    Erectile dysfunction    Hypertension    Obesity (BMI 35.0-39.9 without comorbidity)    Prostate cancer (HCC) 05/2019   Sleep apnea    Urinary  retention     ALLERGIES:  has No Known Allergies.  MEDICATIONS:  Current Outpatient Medications  Medication Sig Dispense Refill   albuterol (VENTOLIN HFA) 108 (90 Base) MCG/ACT inhaler Inhale 2 puffs into the lungs every 6 (six) hours as needed for wheezing or shortness of breath. 18 g 6   ascorbic acid (VITAMIN C) 500 MG tablet Take 500 mg by mouth daily.     aspirin EC 81 MG tablet Take 81 mg by mouth daily. (Patient not taking: Reported on 08/13/2023)     Calcium Carbonate-Vitamin D (CALCIUM 500/D) 500-125 MG-UNIT TABS Take 1 tablet by mouth daily.     cyanocobalamin 1000 MCG tablet Take 2 tablets (2,000 mcg total) by mouth daily. 30 tablet 0   enzalutamide (XTANDI) 40 MG tablet Take 160 mg by mouth daily. (Patient not taking: Reported on 08/13/2023)     fluticasone (FLONASE) 50 MCG/ACT nasal spray Place 2 sprays into both nostrils daily. 16 g 6   hydrochlorothiazide (HYDRODIURIL) 25 MG tablet TAKE 1/2 TABLET (12.5 MG TOTAL) BY MOUTH DAILY. 45 tablet 3   lidocaine-prilocaine (EMLA) cream Apply to affected area once 30 g 3   lisinopril (ZESTRIL) 20 MG tablet TAKE 1 TABLET (20 MG TOTAL) BY MOUTH DAILY. 90 tablet 3   ondansetron (ZOFRAN) 8 MG tablet Take 1 tablet (8 mg total) by mouth every 8 (eight) hours as needed for nausea or vomiting. 30 tablet 1   oxyCODONE ER (XTAMPZA ER) 9 MG C12A Take 9 mg by mouth every 12 (twelve) hours. 60 capsule 0   predniSONE (DELTASONE)  5 MG tablet Take 1 tablet (5 mg total) by mouth in the morning and at bedtime. 60 tablet 11   prochlorperazine (COMPAZINE) 10 MG tablet Take 1 tablet (10 mg total) by mouth every 6 (six) hours as needed for nausea or vomiting. 30 tablet 1   sodium chloride (OCEAN) 0.65 % SOLN nasal spray Place 1 spray into both nostrils as needed for congestion. 30 mL 0   tadalafil (CIALIS) 20 MG tablet Take 1 tablet (20 mg total) by mouth daily as needed. 10 tablet 11   tamsulosin (FLOMAX) 0.4 MG CAPS capsule TAKE 1 CAPSULE (0.4 MG TOTAL) BY  MOUTH DAILY. 30 capsule 11   traMADol (ULTRAM) 50 MG tablet Take 1 - 2 tablets (50 - 100 mg total) by mouth every 6 hours as needed for moderate pain or severe pain. 90 tablet 0   VIAGRA 25 MG tablet Take 1 tablet (25 mg total) by mouth daily as needed for erectile dysfunction. 10 tablet 0   No current facility-administered medications for this visit.    VITAL SIGNS: There were no vitals taken for this visit. There were no vitals filed for this visit.  Estimated body mass index is 34.67 kg/m as calculated from the following:   Height as of 08/06/23: 5\' 10"  (1.778 m).   Weight as of 08/13/23: 241 lb 9.6 oz (109.6 kg).   PERFORMANCE STATUS (ECOG) : 1 - Symptomatic but completely ambulatory  IMPRESSION:  Pain Craig Collins reports his pain is well controlled overall on current regimen. Some days better than others. Yesterday pain seemed worst. He is appreciative that today is better. Pain worsens with activity which limits his ability to be as active as he would like.   We discussed his current regimen: Craig Collins is tolerating Xtampza 9mg  every 12 hours and Tramadol 50-100 mg every 6 hours as needed for breakthrough pain. Does not require around the clock. Tolerating regimen with no unwanted side effects.   No changes in medications at this time. Will continue to closely monitor. Patient knows to contact office as needed.   2. Constipation Controlled with daily regimen  3. Goals of Care  04/16/23-We discussed Craig Collins current illness and what it means in the larger context of his on-going co-morbidities. Natural disease trajectory and expectations were discussed. We discussed the importance of continued conversation with family and their medical providers regarding overall plan of care and treatment options, ensuring decisions are within the context of the patients values and GOCs.   Craig Collins relates that it has become more difficult for him to leave home, mostly due to the pain and  fatigue. There are other factors as well, such as the sweating that he experiences without warning. Patient reports this makes him feel "blue." He is hopeful that an improvement in his pain will translate into an improvement in his energy levels.    Craig Collins relies on his faith as a source of strength in coping with his illness. He states that he does not want to claim having cancer and intentionally turns off the television when there is content on cancer or death. He is trying to enjoy each day of his life and not look at cancer as a death sentence.   I discussed the importance of continued conversation with family and their medical providers regarding overall plan of care and treatment options, ensuring decisions are within the context of the patients values and GOCs.  PLAN:  Xtampza 9mg  every 12 hours.  Tramadol 50-100  mg PO every 6 hours as needed for pain. Vitamin B12 2,000 mcg PO daily. Encourage pacing of activity and taking rest breaks. Senna daily for bowel regimen Miralax daily for bowel regimen Will continue conversations about goals of care. Palliative will plan to see patient back in 2-4 weeks in collaboration to other oncology appointments. Will follow-up in one week by telephone for close evaluation on pain management.    Patient expressed understanding and was in agreement with this plan. He also understands that He can call the clinic at any time with any questions, concerns, or complaints.   Any controlled substances utilized were prescribed in the context of palliative care. PDMP has been reviewed.    Visit consisted of counseling and education dealing with the complex and emotionally intense issues of symptom management and palliative care in the setting of serious and potentially life-threatening illness.Greater than 50%  of this time was spent counseling and coordinating care related to the above assessment and plan.  Willette Alma, AGPCNP-BC  Palliative  Medicine Team/[facility]  *Please note that this is a verbal dictation therefore any spelling or grammatical errors are due to the "[employer]agon [employer] One" system interpretation.

## 2023-08-20 ENCOUNTER — Encounter: Payer: Self-pay | Admitting: Hematology

## 2023-08-21 ENCOUNTER — Encounter: Payer: Self-pay | Admitting: Hematology

## 2023-08-25 MED FILL — Dexamethasone Sodium Phosphate Inj 100 MG/10ML: INTRAMUSCULAR | Qty: 1 | Status: AC

## 2023-08-25 NOTE — Progress Notes (Signed)
Palliative Medicine [facility]  Telephone:(336) [phone] Fax:(336) [phone]   Name: Craig Collins Date: 08/25/2023 MRN: [id number]  DOB: 04-04-1966  Patient Care Team: Ivonne Andrew, NP as PCP - General (Pulmonary Disease) Cherlyn Cushing, RN as Oncology Nurse Navigator Malachy Mood, MD as Consulting Physician (Hematology and Oncology)   INTERVAL HISTORY: Craig Collins is a 57 y.o. 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 Palliative ask to see for symptom and pain management and goals of care.   SOCIAL HISTORY:     reports that he has never smoked. He has never been exposed to tobacco smoke. He has never used smokeless tobacco. He reports that he does not currently use alcohol. He reports that he does not use drugs.  ADVANCE DIRECTIVES:  None on file  CODE STATUS: Full code  PAST MEDICAL HISTORY: Past Medical History:  Diagnosis Date   Benign prostate hyperplasia    Elevated PSA    Erectile dysfunction    Hypertension    Obesity (BMI 35.0-39.9 without comorbidity)    Prostate cancer (HCC) 05/2019   Sleep apnea    Urinary retention     ALLERGIES:  has No Known Allergies.  MEDICATIONS:  Current Outpatient Medications  Medication Sig Dispense Refill   albuterol (VENTOLIN HFA) 108 (90 Base) MCG/ACT inhaler Inhale 2 puffs into the lungs every 6 (six) hours as needed for wheezing or shortness of breath. 18 g 6   ascorbic acid (VITAMIN C) 500 MG tablet Take 500 mg by mouth daily.     aspirin EC 81 MG tablet Take 81 mg by mouth daily. (Patient not taking: Reported on 08/13/2023)     Calcium Carbonate-Vitamin D (CALCIUM 500/D) 500-125 MG-UNIT TABS Take 1 tablet by mouth daily.     cyanocobalamin 1000 MCG tablet Take 2 tablets (2,000 mcg total) by mouth daily. 30 tablet 0   enzalutamide (XTANDI) 40 MG  tablet Take 160 mg by mouth daily. (Patient not taking: Reported on 08/13/2023)     fluticasone (FLONASE) 50 MCG/ACT nasal spray Place 2 sprays into both nostrils daily. 16 g 6   hydrochlorothiazide (HYDRODIURIL) 25 MG tablet TAKE 1/2 TABLET (12.5 MG TOTAL) BY MOUTH DAILY. 45 tablet 3   lidocaine-prilocaine (EMLA) cream Apply to affected area once 30 g 3   lisinopril (ZESTRIL) 20 MG tablet TAKE 1 TABLET (20 MG TOTAL) BY MOUTH DAILY. 90 tablet 3   ondansetron (ZOFRAN) 8 MG tablet Take 1 tablet (8 mg total) by mouth every 8 (eight) hours as needed for nausea or vomiting. 30 tablet 1   oxyCODONE ER (XTAMPZA ER) 9 MG C12A Take 9 mg by mouth every 12 (twelve) hours. 60 capsule 0   predniSONE (DELTASONE) 5 MG tablet Take 1 tablet (5 mg total) by mouth in the morning and at bedtime. 60 tablet 11   prochlorperazine (COMPAZINE) 10 MG tablet Take 1 tablet (10 mg total) by mouth every 6 (six) hours as needed for nausea or vomiting. 30 tablet 1   sodium chloride (OCEAN) 0.65 % SOLN nasal spray Place 1 spray into both nostrils as needed for congestion. 30 mL 0   tadalafil (CIALIS) 20 MG tablet Take 1 tablet (20 mg total) by mouth daily as needed. 10 tablet 11   tamsulosin (FLOMAX) 0.4 MG CAPS capsule TAKE 1 CAPSULE (0.4 MG TOTAL) BY MOUTH DAILY. 30 capsule 11   traMADol (  ULTRAM) 50 MG tablet Take 1 - 2 tablets (50 - 100 mg total) by mouth every 6 hours as needed for moderate pain or severe pain. 90 tablet 0   VIAGRA 25 MG tablet Take 1 tablet (25 mg total) by mouth daily as needed for erectile dysfunction. 10 tablet 0   No current facility-administered medications for this visit.    VITAL SIGNS: There were no vitals taken for this visit. There were no vitals filed for this visit.  Estimated body mass index is 34.67 kg/m as calculated from the following:   Height as of 08/06/23: 5\' 10"  (1.778 m).   Weight as of 08/13/23: 241 lb 9.6 oz (109.6 kg).   PERFORMANCE STATUS (ECOG) : 1 - Symptomatic but completely  ambulatory  Assessment NAD RRR AAO x4  IMPRESSION: Craig Collins presents to clinic today for follow-up. No acute distress. Wife present. Is tolerating treatment well. Denies nausea, vomiting, constipation. Active. Appetite is improving. Some days better than others. Weight up by 3lbs. He is much appreciative of how well he is feeling.   Pain Craig Collins reports his pain is much better since starting treatments. Some days are better than others. Is no longer requiring around the clock medications.   We discussed his current regimen: Craig Collins is no longer requiring Xtampza 9mg . Able to wean and discontinue appropriately. Tramadol 50-100 mg every 6 hours as needed for breakthrough pain. Does not require daily. Last dose a week ago.   No changes in medications at this time. Will continue to closely monitor. Patient knows to contact office as needed.   2. Constipation Controlled with daily regimen  3. Goals of Care  04/16/23-We discussed Craig Collins current illness and what it means in the larger context of his on-going co-morbidities. Natural disease trajectory and expectations were discussed. We discussed the importance of continued conversation with family and their medical providers regarding overall plan of care and treatment options, ensuring decisions are within the context of the patients values and GOCs.   Craig Collins relates that it has become more difficult for him to leave home, mostly due to the pain and fatigue. There are other factors as well, such as the sweating that he experiences without warning. Patient reports this makes him feel "blue." He is hopeful that an improvement in his pain will translate into an improvement in his energy levels.    Craig Collins relies on his faith as a source of strength in coping with his illness. He states that he does not want to claim having cancer and intentionally turns off the television when there is content on cancer or death. He is trying to  enjoy each day of his life and not look at cancer as a death sentence.   I discussed the importance of continued conversation with family and their medical providers regarding overall plan of care and treatment options, ensuring decisions are within the context of the patients values and GOCs.  PLAN:  Xtampza 9mg  discontinued due to improvement in pain.  Tramadol 50-100 mg PO every 6 hours as needed for pain. Not requiring daily.  Encourage pacing of activity and taking rest breaks. Senna daily for bowel regimen Miralax daily for bowel regimen Will continue conversations about goals of care. Palliative will plan to see patient back in 2-4 weeks in collaboration to other oncology appointments.   Patient expressed understanding and was in agreement with this plan. He also understands that He can call the clinic at any time with any  questions, concerns, or complaints.   Any controlled substances utilized were prescribed in the context of palliative care. PDMP has been reviewed.    Visit consisted of counseling and education dealing with the complex and emotionally intense issues of symptom management and palliative care in the setting of serious and potentially life-threatening illness.Greater than 50%  of this time was spent counseling and coordinating care related to the above assessment and plan.  Willette Alma, AGPCNP-BC  Palliative Medicine Team/[facility]  *Please note that this is a verbal dictation therefore any spelling or grammatical errors are due to the "Dragon [employer] One" system interpretation.

## 2023-08-26 ENCOUNTER — Inpatient Hospital Stay (HOSPITAL_BASED_OUTPATIENT_CLINIC_OR_DEPARTMENT_OTHER): Payer: Medicaid Other | Admitting: Nurse Practitioner

## 2023-08-26 ENCOUNTER — Inpatient Hospital Stay (HOSPITAL_BASED_OUTPATIENT_CLINIC_OR_DEPARTMENT_OTHER): Payer: Medicaid Other | Admitting: Hematology

## 2023-08-26 ENCOUNTER — Inpatient Hospital Stay: Payer: Medicaid Other

## 2023-08-26 ENCOUNTER — Encounter: Payer: Self-pay | Admitting: Nurse Practitioner

## 2023-08-26 ENCOUNTER — Inpatient Hospital Stay: Payer: Medicaid Other | Attending: Nurse Practitioner

## 2023-08-26 ENCOUNTER — Other Ambulatory Visit: Payer: Self-pay

## 2023-08-26 VITALS — BP 134/95 | HR 86 | Temp 98.7°F | Resp 18 | Ht 70.0 in | Wt 245.0 lb

## 2023-08-26 DIAGNOSIS — C7951 Secondary malignant neoplasm of bone: Secondary | ICD-10-CM

## 2023-08-26 DIAGNOSIS — M62830 Muscle spasm of back: Secondary | ICD-10-CM

## 2023-08-26 DIAGNOSIS — G893 Neoplasm related pain (acute) (chronic): Secondary | ICD-10-CM | POA: Diagnosis not present

## 2023-08-26 DIAGNOSIS — C61 Malignant neoplasm of prostate: Secondary | ICD-10-CM

## 2023-08-26 DIAGNOSIS — Z5111 Encounter for antineoplastic chemotherapy: Secondary | ICD-10-CM | POA: Diagnosis not present

## 2023-08-26 DIAGNOSIS — Z515 Encounter for palliative care: Secondary | ICD-10-CM | POA: Diagnosis not present

## 2023-08-26 DIAGNOSIS — Z5189 Encounter for other specified aftercare: Secondary | ICD-10-CM | POA: Insufficient documentation

## 2023-08-26 DIAGNOSIS — R530 Neoplastic (malignant) related fatigue: Secondary | ICD-10-CM

## 2023-08-26 DIAGNOSIS — C778 Secondary and unspecified malignant neoplasm of lymph nodes of multiple regions: Secondary | ICD-10-CM

## 2023-08-26 DIAGNOSIS — Z95828 Presence of other vascular implants and grafts: Secondary | ICD-10-CM

## 2023-08-26 LAB — CBC WITH DIFFERENTIAL (CANCER CENTER ONLY)
Abs Immature Granulocytes: 0.02 10*3/uL (ref 0.00–0.07)
Basophils Absolute: 0 10*3/uL (ref 0.0–0.1)
Basophils Relative: 1 %
Eosinophils Absolute: 0 10*3/uL (ref 0.0–0.5)
Eosinophils Relative: 0 %
HCT: 29.8 % — ABNORMAL LOW (ref 39.0–52.0)
Hemoglobin: 9.6 g/dL — ABNORMAL LOW (ref 13.0–17.0)
Immature Granulocytes: 0 %
Lymphocytes Relative: 22 %
Lymphs Abs: 1.1 10*3/uL (ref 0.7–4.0)
MCH: 28.4 pg (ref 26.0–34.0)
MCHC: 32.2 g/dL (ref 30.0–36.0)
MCV: 88.2 fL (ref 80.0–100.0)
Monocytes Absolute: 0.7 10*3/uL (ref 0.1–1.0)
Monocytes Relative: 14 %
Neutro Abs: 2.9 10*3/uL (ref 1.7–7.7)
Neutrophils Relative %: 63 %
Platelet Count: 320 10*3/uL (ref 150–400)
RBC: 3.38 MIL/uL — ABNORMAL LOW (ref 4.22–5.81)
RDW: 15.7 % — ABNORMAL HIGH (ref 11.5–15.5)
WBC Count: 4.7 10*3/uL (ref 4.0–10.5)
nRBC: 0 % (ref 0.0–0.2)

## 2023-08-26 LAB — CMP (CANCER CENTER ONLY)
ALT: 9 U/L (ref 0–44)
AST: 9 U/L — ABNORMAL LOW (ref 15–41)
Albumin: 3.8 g/dL (ref 3.5–5.0)
Alkaline Phosphatase: 529 U/L — ABNORMAL HIGH (ref 38–126)
Anion gap: 8 (ref 5–15)
BUN: 14 mg/dL (ref 6–20)
CO2: 24 mmol/L (ref 22–32)
Calcium: 9.2 mg/dL (ref 8.9–10.3)
Chloride: 108 mmol/L (ref 98–111)
Creatinine: 1.18 mg/dL (ref 0.61–1.24)
GFR, Estimated: >60 mL/min (ref >60)
Glucose, Bld: 94 mg/dL (ref 70–99)
Potassium: 3.7 mmol/L (ref 3.5–5.1)
Sodium: 140 mmol/L (ref 135–145)
Total Bilirubin: 0.3 mg/dL (ref 0.3–1.2)
Total Protein: 6.9 g/dL (ref 6.5–8.1)

## 2023-08-26 MED ORDER — SODIUM CHLORIDE 0.9% FLUSH
10.0000 mL | Freq: Once | INTRAVENOUS | Status: AC
  Administered 2023-08-26: 10 mL

## 2023-08-26 MED ORDER — CYCLOBENZAPRINE HCL 10 MG PO TABS
10.0000 mg | ORAL_TABLET | Freq: Three times a day (TID) | ORAL | 1 refills | Status: DC | PRN
  Filled 2023-08-26: qty 30, 10d supply, fill #0
  Filled 2023-10-24: qty 30, 10d supply, fill #1

## 2023-08-26 MED ORDER — SODIUM CHLORIDE 0.9 % IV SOLN
75.0000 mg/m2 | Freq: Once | INTRAVENOUS | Status: AC
  Administered 2023-08-26: 176 mg via INTRAVENOUS
  Filled 2023-08-26: qty 17.6

## 2023-08-26 MED ORDER — HEPARIN SOD (PORK) LOCK FLUSH 100 UNIT/ML IV SOLN
500.0000 [IU] | Freq: Once | INTRAVENOUS | Status: AC | PRN
  Administered 2023-08-26: 500 [IU]

## 2023-08-26 MED ORDER — SODIUM CHLORIDE 0.9 % IV SOLN: Freq: Once | INTRAVENOUS | Status: AC

## 2023-08-26 MED ORDER — SODIUM CHLORIDE 0.9% FLUSH
10.0000 mL | INTRAVENOUS | Status: DC | PRN
  Administered 2023-08-26: 10 mL

## 2023-08-26 MED ORDER — SODIUM CHLORIDE 0.9 % IV SOLN
10.0000 mg | Freq: Once | INTRAVENOUS | Status: AC
  Administered 2023-08-26: 10 mg via INTRAVENOUS
  Filled 2023-08-26: qty 10

## 2023-08-26 NOTE — Patient Instructions (Signed)
 [facility]  Discharge Instructions: Thank you for choosing [facility] to provide your oncology and hematology care.   If you have a lab appointment with the Cancer Center, please go directly to the Cancer Center and check in at the registration area.   Wear comfortable clothing and clothing appropriate for easy access to any Portacath or PICC line.   We strive to give you quality time with your provider. You may need to reschedule your appointment if you arrive late (15 or more minutes).  Arriving late affects you and other patients whose appointments are after yours.  Also, if you miss three or more appointments without notifying the office, you may be dismissed from the clinic at the provider's discretion.      For prescription refill requests, have your pharmacy contact our office and allow 72 hours for refills to be completed.    Today you received the following chemotherapy and/or immunotherapy agents: Docetaxel      To help prevent nausea and vomiting after your treatment, we encourage you to take your nausea medication as directed.  BELOW ARE SYMPTOMS THAT SHOULD BE REPORTED IMMEDIATELY: *FEVER GREATER THAN 100.4 F (38 C) OR HIGHER *CHILLS OR SWEATING *NAUSEA AND VOMITING THAT IS NOT CONTROLLED WITH YOUR NAUSEA MEDICATION *UNUSUAL SHORTNESS OF BREATH *UNUSUAL BRUISING OR BLEEDING *URINARY PROBLEMS (pain or burning when urinating, or frequent urination) *BOWEL PROBLEMS (unusual diarrhea, constipation, pain near the anus) TENDERNESS IN MOUTH AND THROAT WITH OR WITHOUT PRESENCE OF ULCERS (sore throat, sores in mouth, or a toothache) UNUSUAL RASH, SWELLING OR PAIN  UNUSUAL VAGINAL DISCHARGE OR ITCHING   Items with * indicate a potential emergency and should be followed up as soon as possible or go to the Emergency Department if any problems should occur.  Please show the CHEMOTHERAPY ALERT CARD or IMMUNOTHERAPY ALERT CARD at  check-in to the Emergency Department and triage nurse.  Should you have questions after your visit or need to cancel or reschedule your appointment, please contact [facility]  Dept: [phone]  and follow the prompts.  Office hours are 8:00 a.m. to 4:30 p.m. Monday - Friday. Please note that voicemails left after 4:00 p.m. may not be returned until the following business day.  We are closed weekends and major holidays. You have access to a nurse at all times for urgent questions. Please call the main number to the clinic Dept: [phone] and follow the prompts.   For any non-urgent questions, you may also contact your provider using MyChart. We now offer e-Visits for anyone 10 and older to request care online for non-urgent symptoms. For details visit [email or web address].   Also download the MyChart app! Go to the app store, search "MyChart", open the app, select [place], and log in with your MyChart username and password.  Docetaxel Injection What is this medication? DOCETAXEL (doe se TAX el) treats some types of cancer. It works by slowing down the growth of cancer cells. This medicine may be used for other purposes; ask your health care provider or pharmacist if you have questions. COMMON BRAND NAME(S): Docefrez, Docivyx, Taxotere What should I tell my care team before I take this medication? They need to know if you have any of these conditions: Kidney disease Liver disease Low white blood cell levels Tingling of the fingers or toes or other nerve disorder An unusual or allergic reaction to docetaxel, polysorbate 80, other medications, foods, dyes,  or preservatives Pregnant or trying to get pregnant Breast-feeding How should I use this medication? This medication is injected into a vein. It is given by your care team in a hospital or clinic setting. Talk to your care team about the use of this medication in children. Special care may be  needed. Overdosage: If you think you have taken too much of this medicine contact a poison control center or emergency room at once. NOTE: This medicine is only for you. Do not share this medicine with others. What if I miss a dose? Keep appointments for follow-up doses. It is important not to miss your dose. Call your care team if you are unable to keep an appointment. What may interact with this medication? Do not take this medication with any of the following: Live virus vaccines This medication may also interact with the following: Certain antibiotics, such as clarithromycin, telithromycin Certain antivirals for HIV or hepatitis Certain medications for fungal infections, such as itraconazole, ketoconazole, voriconazole Grapefruit juice Nefazodone Supplements, such as St. John's wort This list may not describe all possible interactions. Give your health care provider a list of all the medicines, herbs, non-prescription drugs, or dietary supplements you use. Also tell them if you smoke, drink alcohol, or use illegal drugs. Some items may interact with your medicine. What should I watch for while using this medication? This medication may make you feel generally unwell. This is not uncommon as chemotherapy can affect healthy cells as well as cancer cells. Report any side effects. Continue your course of treatment even though you feel ill unless your care team tells you to stop. You may need blood work done while you are taking this medication. This medication can cause serious side effects and infusion reactions. To reduce the risk, your care team may give you other medications to take before receiving this one. Be sure to follow the directions from your care team. This medication may increase your risk of getting an infection. Call your care team for advice if you get a fever, chills, sore throat, or other symptoms of a cold or flu. Do not treat yourself. Try to avoid being around people who  are sick. Avoid taking medications that contain aspirin, acetaminophen, ibuprofen, naproxen, or ketoprofen unless instructed by your care team. These medications may hide a fever. Be careful brushing or flossing your teeth or using a toothpick because you may get an infection or bleed more easily. If you have any dental work done, tell your dentist you are receiving this medication. Some products may contain alcohol. Ask your care team if this medication contains alcohol. Be sure to tell all care teams you are taking this medicine. Certain medications, like metronidazole and disulfiram, can cause an unpleasant reaction when taken with alcohol. The reaction includes flushing, headache, nausea, vomiting, sweating, and increased thirst. The reaction can last from 30 minutes to several hours. This medication may affect your coordination, reaction time, or judgement. Do not drive or operate machinery until you know how this medication affects you. Sit up or stand slowly to reduce the risk of dizzy or fainting spells. Drinking alcohol with this medication can increase the risk of these side effects. Talk to your care team about your risk of cancer. You may be more at risk for certain types of cancer if you take this medication. Talk to your care team if you wish to become pregnant or think you might be pregnant. This medication can cause serious birth defects if taken during  pregnancy or if you get pregnant within 2 months after stopping therapy. A negative pregnancy test is required before starting this medication. A reliable form of contraception is recommended while taking this medication and for 2 months after stopping it. Talk to your care team about reliable forms of contraception. Do not breast-feed while taking this medication and for 1 week after stopping therapy. Use a condom during sex and for 4 months after stopping therapy. Tell your care team right away if you think your partner might be pregnant.  This medication can cause serious birth defects. This medication may cause infertility. Talk to your care team if you are concerned about your fertility. What side effects may I notice from receiving this medication? Side effects that you should report to your care team as soon as possible: Allergic reactions--skin rash, itching, hives, swelling of the face, lips, tongue, or throat Change in vision such as blurry vision, seeing halos around lights, vision loss Infection--fever, chills, cough, or sore throat Infusion reactions--chest pain, shortness of breath or trouble breathing, feeling faint or lightheaded Low red blood cell level--unusual weakness or fatigue, dizziness, headache, trouble breathing Pain, tingling, or numbness in the hands or feet Painful swelling, warmth, or redness of the skin, blisters or sores at the infusion site Redness, blistering, peeling, or loosening of the skin, including inside the mouth Sudden or severe stomach pain, bloody diarrhea, fever, nausea, vomiting Swelling of the ankles, hands, or feet Tumor lysis syndrome (TLS)--nausea, vomiting, diarrhea, decrease in the amount of urine, dark urine, unusual weakness or fatigue, confusion, muscle pain or cramps, fast or irregular heartbeat, joint pain Unusual bruising or bleeding Side effects that usually do not require medical attention (report to your care team if they continue or are bothersome): Change in nail shape, thickness, or color Change in taste Hair loss Increased tears This list may not describe all possible side effects. Call your doctor for medical advice about side effects. You may report side effects to FDA at 1-800-FDA-1088. Where should I keep my medication? This medication is given in a hospital or clinic. It will not be stored at home. NOTE: This sheet is a summary. It may not cover all possible information. If you have questions about this medicine, talk to your doctor, pharmacist, or health care  provider.  2024 Elsevier/Gold Standard (2022-02-13 00:00:00)

## 2023-08-26 NOTE — Assessment & Plan Note (Signed)
-  Due to extensive bone metastasis, he is at high risk for fracture -Continue vitamin D and calcium supplement -I discussed the benefit of Xgeva or Zometa.  He still has dental work to complete, he is scheduled for deep cleaning in November, I will call his dental office to see if they can move this up.

## 2023-08-26 NOTE — Progress Notes (Signed)
[facility]   Telephone:(336) [phone] [phone]) [phone]   Clinic Follow up Note   Patient Care Team: Ivonne Andrew, NP as PCP - General (Pulmonary Disease) Cherlyn Cushing, RN as Oncology Nurse Navigator Malachy Mood, MD as Consulting Physician (Hematology and Oncology)  Date of Service:  08/26/2023  CHIEF COMPLAINT: f/u of prostate cancer  CURRENT THERAPY:    docetaxel and prednisone every 21 days    ASSESSMENT:  Craig Collins is a 57 y.o. male with   Prostate cancer (HCC) -Staging IV with node metastasis, castration resistant. -Diagnosed in 2020, initially presented with Gleason score 9 and PSA 42 -he started ADT in 2020, initially responded with PSA dropped to 2.77 in November 2021.  PSA started rising and went to 9.3 in November 2022.  Patient was not very compliant with his treatment. -He received palliative radiation to the left chest wall and left supraclavicular nodes in 2023. -He started Xtandi 160 mg daily in July 2023. He responded well and PSA has been less than 1 since then. -During his recent visit with [facility] Dr.Dr. Mena Goes on 06/17/2023, he was found to have elevated PSA 5.73, and he has developed low back pain and bilateral hip pain, concerning for disease progression. -He underwent restaging PSMA PET scan on July 16, 2023.  The scan has not been read by radiologist.  I personally reviewed the the scan images and compared to his previous PET scan from 2023, which showed mixed response, with worsening bone metastasis in pelvis and bilateral hip, but the improvement in the adenopathy and bone mets in lower neck and upper chest, probably partially related to previous radiation. -We reviewed the next line treatment options, including chemotherapy, Pluvicto, and targeted therapy depending on his genetic testing results.  His previously genetic testing was negative.  I will request next generation sequencing on his initial prostate biopsy sample. -his PET scan from  July 21, 2023 showed mixed response, but overall worsening bone metastasis.  I recommended changing treatment to chemotherapy docetaxel every 3 weeks, for up to 10 cycles. -Goal of chemotherapy is palliative, for disease control and prolong his life. -Plan to start chemo today, I reviewed the potential side effects from docetaxel, and the management, he voiced good understanding and agrees to proceed. -Staging IV with node metastasis, castration resistant. -Diagnosed in 2020, initially presented with Gleason score 9 and PSA 42 -he started ADT in 2020, initially responded with PSA dropped to 2.77 in November 2021.  PSA started rising and went to 9.3 in November 2022.  Patient was not very compliant with his treatment. -He received palliative radiation to the left chest wall and left supraclavicular nodes in 2023. -He started Xtandi 160 mg daily in July 2023. He responded well and PSA has been less than 1 since then. -During his recent visit with [facility] Dr.Dr. Mena Goes on 06/17/2023, he was found to have elevated PSA 5.73, and he has developed low back pain and bilateral hip pain, concerning for disease progression. -He underwent restaging PSMA PET scan on July 16, 2023.  The scan has not been read by radiologist.  I personally reviewed the the scan images and compared to his previous PET scan from 2023, which showed mixed response, with worsening bone metastasis in pelvis and bilateral hip, but the improvement in the adenopathy and bone mets in lower neck and upper chest, probably partially related to previous radiation. -We reviewed the next line treatment options, including chemotherapy, Pluvicto, and targeted therapy depending on his  genetic testing results.  His previously genetic testing was negative.  I will request next generation sequencing on his initial prostate biopsy sample. -his PET scan from July 21, 2023 showed mixed response, but overall worsening bone metastasis.  I recommended changing  treatment to chemotherapy docetaxel every 3 weeks, for up to 10 cycles. -Goal of chemotherapy is palliative, for disease control and prolong his life. He started chemo on 08/05/23, tolerated first cycle well overall    Metastasis to bone (HCC) -Due to extensive bone metastasis, he is at high risk for fracture -Continue vitamin D and calcium supplement -I discussed the benefit of Xgeva or Zometa.  He still has dental work to complete, he is scheduled for deep cleaning in November, I will call his dental office to see if they can move this up.    PLAN: - I refill flexeril -proceed with C2 Taxotere today -lab/flush and treatment 09/16/2023  SUMMARY OF ONCOLOGIC HISTORY: Oncology History Overview Note   Cancer Staging  Prostate cancer [facility]) Staging form: Prostate, AJCC 8th Edition - Clinical: Stage IVB (cTX, cNX, pM1b, PSA: 42, Grade Group: 5) - Signed by Benjiman Core, MD on 07/08/2022 Prostate specific antigen (PSA) range: 20 or greater Histologic grading system: 5 grade system     Prostate cancer (HCC)  12/21/2019 Initial Diagnosis   Prostate cancer (HCC)   07/08/2022 Cancer Staging   Staging form: Prostate, AJCC 8th Edition - Clinical: Stage IVB (cTX, cNX, pM1b, PSA: 42, Grade Group: 5) - Signed by Benjiman Core, MD on 07/08/2022 Prostate specific antigen (PSA) range: 20 or greater Histologic grading system: 5 grade system   Metastasis to bone (HCC)  07/08/2022 Initial Diagnosis   Metastasis to bone (HCC)   07/16/2023 PET scan    IMPRESSION: 1. Mixed response to therapy. 2. Interval decrease in size of the prostate gland with mild residual tracer uptake within the right posterior gland. 3. Interval decrease in size of thoracic and abdominal adenopathy. 4. Persistent, multifocal tracer avid bone metastases. Overall, the tracer avid lesions appear increased in size and degree of tracer uptake. Lesion within the right calvarium has resolved in the interval.     Miscellaneous   [facility]      Malignant neoplasm of prostate metastatic to lymph nodes of multiple sites (HCC)  07/08/2022 Initial Diagnosis   Malignant neoplasm of prostate metastatic to lymph nodes of multiple sites [facility])   08/05/2023 -  Chemotherapy   Patient is on Treatment Plan : PROSTATE Docetaxel (75) + Prednisone q21d        INTERVAL HISTORY:  Craig Collins is here for a follow up of prostate cancer. He was last seen by NP Heather on 08/13/2023. He presents to the clinic accompanied by wife. Pt state that he had no issue with treatment. He state that he did have achy pain after receiving the Udenyca , he was in pain for about 3 days.      All other systems were reviewed with the patient and are negative.  MEDICAL HISTORY:  Past Medical History:  Diagnosis Date   Benign prostate hyperplasia    Elevated PSA    Erectile dysfunction    Hypertension    Obesity (BMI 35.0-39.9 without comorbidity)    Prostate cancer (HCC) 05/2019   Sleep apnea    Urinary retention     SURGICAL HISTORY: Past Surgical History:  Procedure Laterality Date   COLONOSCOPY N/A 10/14/2017   Procedure: COLONOSCOPY;  Surgeon: Iva Boop, MD;  Location:  [facility];  Service: Endoscopy;  Laterality: N/A;   ESOPHAGOGASTRODUODENOSCOPY (EGD) WITH PROPOFOL N/A 08/04/2022   Procedure: ESOPHAGOGASTRODUODENOSCOPY (EGD) WITH PROPOFOL;  Surgeon: Charna Elizabeth, MD;  Location: [facility]COPY;  Service: Gastroenterology;  Laterality: N/A;   IR IMAGING GUIDED PORT INSERTION  08/06/2023    I have reviewed the social history and family history with the patient and they are unchanged from previous note.  ALLERGIES:  has No Known Allergies.  MEDICATIONS:  Current Outpatient Medications  Medication Sig Dispense Refill   albuterol (VENTOLIN HFA) 108 (90 Base) MCG/ACT inhaler Inhale 2 puffs into the lungs every 6 (six) hours as needed for wheezing or shortness of breath. 18 g 6   ascorbic acid (VITAMIN  C) 500 MG tablet Take 500 mg by mouth daily.     aspirin EC 81 MG tablet Take 81 mg by mouth daily. (Patient not taking: Reported on 08/13/2023)     Calcium Carbonate-Vitamin D (CALCIUM 500/D) 500-125 MG-UNIT TABS Take 1 tablet by mouth daily.     cyanocobalamin 1000 MCG tablet Take 2 tablets (2,000 mcg total) by mouth daily. 30 tablet 0   enzalutamide (XTANDI) 40 MG tablet Take 160 mg by mouth daily. (Patient not taking: Reported on 08/13/2023)     fluticasone (FLONASE) 50 MCG/ACT nasal spray Place 2 sprays into both nostrils daily. 16 g 6   hydrochlorothiazide (HYDRODIURIL) 25 MG tablet TAKE 1/2 TABLET (12.5 MG TOTAL) BY MOUTH DAILY. 45 tablet 3   lidocaine-prilocaine (EMLA) cream Apply to affected area once 30 g 3   lisinopril (ZESTRIL) 20 MG tablet TAKE 1 TABLET (20 MG TOTAL) BY MOUTH DAILY. 90 tablet 3   ondansetron (ZOFRAN) 8 MG tablet Take 1 tablet (8 mg total) by mouth every 8 (eight) hours as needed for nausea or vomiting. 30 tablet 1   oxyCODONE ER (XTAMPZA ER) 9 MG C12A Take 9 mg by mouth every 12 (twelve) hours. 60 capsule 0   predniSONE (DELTASONE) 5 MG tablet Take 1 tablet (5 mg total) by mouth in the morning and at bedtime. 60 tablet 11   prochlorperazine (COMPAZINE) 10 MG tablet Take 1 tablet (10 mg total) by mouth every 6 (six) hours as needed for nausea or vomiting. 30 tablet 1   sodium chloride (OCEAN) 0.65 % SOLN nasal spray Place 1 spray into both nostrils as needed for congestion. 30 mL 0   tadalafil (CIALIS) 20 MG tablet Take 1 tablet (20 mg total) by mouth daily as needed. 10 tablet 11   tamsulosin (FLOMAX) 0.4 MG CAPS capsule TAKE 1 CAPSULE (0.4 MG TOTAL) BY MOUTH DAILY. 30 capsule 11   traMADol (ULTRAM) 50 MG tablet Take 1 - 2 tablets (50 - 100 mg total) by mouth every 6 hours as needed for moderate pain or severe pain. 90 tablet 0   VIAGRA 25 MG tablet Take 1 tablet (25 mg total) by mouth daily as needed for erectile dysfunction. 10 tablet 0   No current  facility-administered medications for this visit.    PHYSICAL EXAMINATION: ECOG PERFORMANCE STATUS: 1 - Symptomatic but completely ambulatory  Vitals:   08/26/23 0948  BP: (!) 134/95  Pulse: 86  Resp: 18  Temp: 98.7 F (37.1 C)  SpO2: 100%   Wt Readings from Last 3 Encounters:  08/26/23 245 lb (111.1 kg)  08/13/23 241 lb 9.6 oz (109.6 kg)  08/06/23 245 lb (111.1 kg)     GENERAL:alert, no distress and comfortable SKIN: skin color normal, no rashes or significant lesions EYES:  normal, Conjunctiva are pink and non-injected, sclera clear  NEURO: alert & oriented x 3 with fluent speech   LABORATORY DATA:  I have reviewed the data as listed    Latest Ref Rng & Units 08/26/2023    9:17 AM 08/13/2023   10:35 AM 08/05/2023   11:06 AM  CBC  WBC 4.0 - 10.5 K/uL 4.7  10.8  4.0   Hemoglobin 13.0 - 17.0 g/dL 9.6  16.1  09.6   Hematocrit 39.0 - 52.0 % 29.8  31.0  32.4   Platelets 150 - 400 K/uL 320  231  265         Latest Ref Rng & Units 08/13/2023   10:35 AM 08/05/2023   11:06 AM 07/21/2023   10:36 AM  CMP  Glucose 70 - 99 mg/dL 045  409  811   BUN 6 - 20 mg/dL 15  16  15    Creatinine 0.61 - 1.24 mg/dL 9.14  7.82  9.56   Sodium 135 - 145 mmol/L 137  139  140   Potassium 3.5 - 5.1 mmol/L 3.4  3.7  3.4   Chloride 98 - 111 mmol/L 103  105  106   CO2 22 - 32 mmol/L 22  27  24    Calcium 8.9 - 10.3 mg/dL 9.4  9.8  9.9   Total Protein 6.5 - 8.1 g/dL 7.5  8.0  7.8   Total Bilirubin 0.3 - 1.2 mg/dL 0.3  0.4  0.4   Alkaline Phos 38 - 126 U/L 367  411  313   AST 15 - 41 U/L 20  12  8    ALT 0 - 44 U/L 24  11  7        RADIOGRAPHIC STUDIES: I have personally reviewed the radiological images as listed and agreed with the findings in the report. No results found.    No orders of the defined types were placed in this encounter.  All questions were answered. The patient knows to call the clinic with any problems, questions or concerns. No barriers to learning was detected. The  total time spent in the appointment was 25 minutes.     Malachy Mood, MD 08/26/2023   Carolin Coy, CMA, am acting as scribe for Malachy Mood, MD.   I have reviewed the above documentation for accuracy and completeness, and I agree with the above.

## 2023-08-26 NOTE — Assessment & Plan Note (Signed)
-  Staging IV with node metastasis, castration resistant. -Diagnosed in 2020, initially presented with Gleason score 9 and PSA 42 -he started ADT in 2020, initially responded with PSA dropped to 2.77 in November 2021.  PSA started rising and went to 9.3 in November 2022.  Patient was not very compliant with his treatment. -He received palliative radiation to the left chest wall and left supraclavicular nodes in 2023. -He started Xtandi 160 mg daily in July 2023. He responded well and PSA has been less than 1 since then. -During his recent visit with [facility] Dr.Dr. Mena Goes on 06/17/2023, he was found to have elevated PSA 5.73, and he has developed low back pain and bilateral hip pain, concerning for disease progression. -He underwent restaging PSMA PET scan on July 16, 2023.  The scan has not been read by radiologist.  I personally reviewed the the scan images and compared to his previous PET scan from 2023, which showed mixed response, with worsening bone metastasis in pelvis and bilateral hip, but the improvement in the adenopathy and bone mets in lower neck and upper chest, probably partially related to previous radiation. -We reviewed the next line treatment options, including chemotherapy, Pluvicto, and targeted therapy depending on his genetic testing results.  His previously genetic testing was negative.  I will request next generation sequencing on his initial prostate biopsy sample. -his PET scan from July 21, 2023 showed mixed response, but overall worsening bone metastasis.  I recommended changing treatment to chemotherapy docetaxel every 3 weeks, for up to 10 cycles. -Goal of chemotherapy is palliative, for disease control and prolong his life. -Plan to start chemo today, I reviewed the potential side effects from docetaxel, and the management, he voiced good understanding and agrees to proceed. -Staging IV with node metastasis, castration resistant. -Diagnosed in 2020, initially presented with  Gleason score 9 and PSA 42 -he started ADT in 2020, initially responded with PSA dropped to 2.77 in November 2021.  PSA started rising and went to 9.3 in November 2022.  Patient was not very compliant with his treatment. -He received palliative radiation to the left chest wall and left supraclavicular nodes in 2023. -He started Xtandi 160 mg daily in July 2023. He responded well and PSA has been less than 1 since then. -During his recent visit with [facility] Dr.Dr. Mena Goes on 06/17/2023, he was found to have elevated PSA 5.73, and he has developed low back pain and bilateral hip pain, concerning for disease progression. -He underwent restaging PSMA PET scan on July 16, 2023.  The scan has not been read by radiologist.  I personally reviewed the the scan images and compared to his previous PET scan from 2023, which showed mixed response, with worsening bone metastasis in pelvis and bilateral hip, but the improvement in the adenopathy and bone mets in lower neck and upper chest, probably partially related to previous radiation. -We reviewed the next line treatment options, including chemotherapy, Pluvicto, and targeted therapy depending on his genetic testing results.  His previously genetic testing was negative.  I will request next generation sequencing on his initial prostate biopsy sample. -his PET scan from July 21, 2023 showed mixed response, but overall worsening bone metastasis.  I recommended changing treatment to chemotherapy docetaxel every 3 weeks, for up to 10 cycles. -Goal of chemotherapy is palliative, for disease control and prolong his life. He started chemo on 08/05/23, tolerated first cycle well overall

## 2023-08-28 ENCOUNTER — Encounter: Payer: Self-pay | Admitting: Pharmacist

## 2023-08-28 ENCOUNTER — Inpatient Hospital Stay: Payer: Medicaid Other

## 2023-08-28 ENCOUNTER — Other Ambulatory Visit: Payer: Self-pay

## 2023-08-28 DIAGNOSIS — C61 Malignant neoplasm of prostate: Secondary | ICD-10-CM | POA: Diagnosis not present

## 2023-08-28 DIAGNOSIS — Z5111 Encounter for antineoplastic chemotherapy: Secondary | ICD-10-CM | POA: Diagnosis not present

## 2023-08-28 DIAGNOSIS — Z5189 Encounter for other specified aftercare: Secondary | ICD-10-CM | POA: Diagnosis not present

## 2023-08-28 MED ORDER — PEGFILGRASTIM-CBQV 6 MG/0.6ML ~~LOC~~ SOSY
6.0000 mg | PREFILLED_SYRINGE | Freq: Once | SUBCUTANEOUS | Status: AC
  Administered 2023-08-28: 6 mg via SUBCUTANEOUS
  Filled 2023-08-28: qty 0.6

## 2023-08-29 ENCOUNTER — Encounter: Payer: Self-pay | Admitting: Hematology

## 2023-09-03 ENCOUNTER — Other Ambulatory Visit: Payer: Self-pay | Admitting: Nurse Practitioner

## 2023-09-03 ENCOUNTER — Other Ambulatory Visit: Payer: Self-pay

## 2023-09-03 DIAGNOSIS — I10 Essential (primary) hypertension: Secondary | ICD-10-CM

## 2023-09-03 MED ORDER — LISINOPRIL 20 MG PO TABS
20.0000 mg | ORAL_TABLET | Freq: Every day | ORAL | 3 refills | Status: DC
  Filled 2023-09-03: qty 90, 90d supply, fill #0

## 2023-09-04 ENCOUNTER — Encounter: Payer: Self-pay | Admitting: Nurse Practitioner

## 2023-09-04 ENCOUNTER — Ambulatory Visit: Payer: Medicaid Other | Admitting: Nurse Practitioner

## 2023-09-04 ENCOUNTER — Other Ambulatory Visit: Payer: Self-pay

## 2023-09-04 DIAGNOSIS — I10 Essential (primary) hypertension: Secondary | ICD-10-CM

## 2023-09-04 MED ORDER — LISINOPRIL 20 MG PO TABS
20.0000 mg | ORAL_TABLET | Freq: Every day | ORAL | 3 refills | Status: DC
  Filled 2023-12-20: qty 90, 90d supply, fill #0
  Filled 2024-03-17: qty 90, 90d supply, fill #1
  Filled 2024-07-22: qty 90, 90d supply, fill #2

## 2023-09-04 NOTE — Patient Instructions (Addendum)
1. Hypertension, essential  - lisinopril (ZESTRIL) 20 MG tablet; Take 1 tablet (20 mg total) by mouth daily.  Dispense: 90 tablet; Refill: 3   Follow up:  Follow up in 3 months

## 2023-09-04 NOTE — Progress Notes (Signed)
Subjective   Patient ID: Craig Collins, male    DOB: [birth year], 57 y.o.   MRN: [id number]   Chief Complaint  Patient presents with   Follow-up           Referring provider: Ivonne Andrew, NP  Craig Collins is a 57 y.o. male with Past Medical History: No date: Benign prostate hyperplasia No date: Elevated PSA No date: Erectile dysfunction No date: Hypertension No date: Obesity (BMI 35.0-39.9 without comorbidity) 05/2019: Prostate cancer (HCC) No date: Sleep apnea No date: Urinary retention   HPI  Patient presents today for follow-up visit.  He states that he has been doing well. He has followed with pulmonary. Imaging showed that his lungs were clear other than some scarring in the bases.  He does continue to follow with oncology for prostate cancer. Currently undergoing chemo.  Patient does need lisinopril refilled today.  He is up-to-date on blood work through oncology.  Denies f/c/s, n/v/d, hemoptysis, PND, leg swelling. Denies chest pain or edema.   No Known Allergies  Immunization History  Administered Date(s) Administered   Influenza,inj,Quad PF,6+ Mos 10/08/2020   PFIZER(Purple Top)SARS-COV-2 Vaccination 03/06/2020, 04/19/2020, 11/13/2020    Tobacco History: Social History   Tobacco Use  Smoking Status Never   Passive exposure: Never  Smokeless Tobacco Never   Counseling given: Not Answered   Outpatient Encounter Medications as of 09/04/2023  Medication Sig   albuterol (VENTOLIN HFA) 108 (90 Base) MCG/ACT inhaler Inhale 2 puffs into the lungs every 6 (six) hours as needed for wheezing or shortness of breath.   ascorbic acid (VITAMIN C) 500 MG tablet Take 500 mg by mouth daily.   aspirin EC 81 MG tablet Take 81 mg by mouth daily.   Calcium Carbonate-Vitamin D (CALCIUM 500/D) 500-125 MG-UNIT TABS Take 1 tablet by mouth daily.   cyanocobalamin 1000 MCG tablet Take 2 tablets (2,000 mcg total) by mouth daily.   cyclobenzaprine (FLEXERIL) 10 MG  tablet Take 1 tablet (10 mg total) by mouth 3 (three) times daily as needed for muscle spasms.   fluticasone (FLONASE) 50 MCG/ACT nasal spray Place 2 sprays into both nostrils daily.   hydrochlorothiazide (HYDRODIURIL) 25 MG tablet TAKE 1/2 TABLET (12.5 MG TOTAL) BY MOUTH DAILY.   lidocaine-prilocaine (EMLA) cream Apply to affected area once   ondansetron (ZOFRAN) 8 MG tablet Take 1 tablet (8 mg total) by mouth every 8 (eight) hours as needed for nausea or vomiting.   predniSONE (DELTASONE) 5 MG tablet Take 1 tablet (5 mg total) by mouth in the morning and at bedtime.   prochlorperazine (COMPAZINE) 10 MG tablet Take 1 tablet (10 mg total) by mouth every 6 (six) hours as needed for nausea or vomiting.   sodium chloride (OCEAN) 0.65 % SOLN nasal spray Place 1 spray into both nostrils as needed for congestion.   tadalafil (CIALIS) 20 MG tablet Take 1 tablet (20 mg total) by mouth daily as needed.   tamsulosin (FLOMAX) 0.4 MG CAPS capsule TAKE 1 CAPSULE (0.4 MG TOTAL) BY MOUTH DAILY.   traMADol (ULTRAM) 50 MG tablet Take 1 - 2 tablets (50 - 100 mg total) by mouth every 6 hours as needed for moderate pain or severe pain.   VIAGRA 25 MG tablet Take 1 tablet (25 mg total) by mouth daily as needed for erectile dysfunction.   [DISCONTINUED] lisinopril (ZESTRIL) 20 MG tablet Take 1 tablet (20 mg total) by mouth daily.   lisinopril (ZESTRIL) 20 MG tablet Take 1 tablet (  20 mg total) by mouth daily.   No facility-administered encounter medications on file as of 09/04/2023.    Review of Systems  Review of Systems  Constitutional: Negative.   HENT: Negative.    Cardiovascular: Negative.   Gastrointestinal: Negative.   Allergic/Immunologic: Negative.   Neurological: Negative.   Psychiatric/Behavioral: Negative.       Objective:   BP 112/79 (BP Location: Left Arm, Patient Position: Sitting)   Pulse (!) 103   Ht 5\' 10"  (1.778 m)   Wt 238 lb (108 kg)   SpO2 98%   BMI 34.15 kg/m   Wt Readings from  Last 5 Encounters:  09/04/23 238 lb (108 kg)  08/26/23 245 lb (111.1 kg)  08/13/23 241 lb 9.6 oz (109.6 kg)  08/06/23 245 lb (111.1 kg)  08/05/23 245 lb 4.8 oz (111.3 kg)     Physical Exam Vitals and nursing note reviewed.  Constitutional:      General: He is not in acute distress.    Appearance: He is well-developed.  Cardiovascular:     Rate and Rhythm: Normal rate and regular rhythm.  Pulmonary:     Effort: Pulmonary effort is normal.     Breath sounds: Normal breath sounds.  Skin:    General: Skin is warm and dry.  Neurological:     Mental Status: He is alert and oriented to person, place, and time.       Assessment & Plan:   Hypertension, essential -     Lisinopril; Take 1 tablet (20 mg total) by mouth daily.  Dispense: 90 tablet; Refill: 3     Return in about 6 months (around 03/03/2024).   Ivonne Andrew, NP 09/04/2023

## 2023-09-10 ENCOUNTER — Encounter: Payer: Self-pay | Admitting: Hematology

## 2023-09-10 NOTE — Telephone Encounter (Signed)
Open by mistake

## 2023-09-11 ENCOUNTER — Other Ambulatory Visit: Payer: Self-pay

## 2023-09-15 MED FILL — Dexamethasone Sodium Phosphate Inj 100 MG/10ML: INTRAMUSCULAR | Qty: 1 | Status: AC

## 2023-09-15 NOTE — Progress Notes (Unsigned)
Patient Care Team: Ivonne Andrew, NP as PCP - General (Pulmonary Disease) Cherlyn Cushing, RN as Oncology Nurse Navigator Malachy Mood, MD as Consulting Physician (Hematology and Oncology)   CHIEF COMPLAINT: Follow-up metastatic prostate cancer  Oncology History Overview Note   Cancer Staging  Prostate cancer [facility]) Staging form: Prostate, AJCC 8th Edition - Clinical: Stage IVB (cTX, cNX, pM1b, PSA: 42, Grade Group: 5) - Signed by Benjiman Core, MD on 07/08/2022 Prostate specific antigen (PSA) range: 20 or greater Histologic grading system: 5 grade system     Prostate cancer (HCC)  12/21/2019 Initial Diagnosis   Prostate cancer (HCC)   07/08/2022 Cancer Staging   Staging form: Prostate, AJCC 8th Edition - Clinical: Stage IVB (cTX, cNX, pM1b, PSA: 42, Grade Group: 5) - Signed by Benjiman Core, MD on 07/08/2022 Prostate specific antigen (PSA) range: 20 or greater Histologic grading system: 5 grade system   Metastasis to bone (HCC)  07/08/2022 Initial Diagnosis   Metastasis to bone (HCC)   07/16/2023 PET scan    IMPRESSION: 1. Mixed response to therapy. 2. Interval decrease in size of the prostate gland with mild residual tracer uptake within the right posterior gland. 3. Interval decrease in size of thoracic and abdominal adenopathy. 4. Persistent, multifocal tracer avid bone metastases. Overall, the tracer avid lesions appear increased in size and degree of tracer uptake. Lesion within the right calvarium has resolved in the interval.    Miscellaneous   [facility]      Malignant neoplasm of prostate metastatic to lymph nodes of multiple sites (HCC)  07/08/2022 Initial Diagnosis   Malignant neoplasm of prostate metastatic to lymph nodes of multiple sites (HCC)   08/05/2023 -  Chemotherapy   Patient is on Treatment Plan : PROSTATE Docetaxel (75) + Prednisone q21d        CURRENT THERAPY: Docetaxel and prednisone every 21 days; starting 08/05/2023  INTERVAL  HISTORY Mr. Craig Collins returns for follow-up and treatment as scheduled, last seen by Dr. Mosetta Putt 08/26/2023 with cycle 2 Taxotere. He has intermittent fatigue, but out of bed, mobile and functional.  Has to force himself to eat due to metallic taste in his mouth, no mucositis.  Thinks he is drinking enough.  He has several days of moderate bone pain with G-CSF.  The lower back and hip pain he had prior to starting chemo has resolved.  ROS  All other systems reviewed and negative  Past Medical History:  Diagnosis Date   Benign prostate hyperplasia    Elevated PSA    Erectile dysfunction    Hypertension    Obesity (BMI 35.0-39.9 without comorbidity)    Prostate cancer (HCC) 05/2019   Sleep apnea    Urinary retention      Past Surgical History:  Procedure Laterality Date   COLONOSCOPY N/A 10/14/2017   Procedure: COLONOSCOPY;  Surgeon: Iva Boop, MD;  Location: [facility] ENDOSCOPY;  Service: Endoscopy;  Laterality: N/A;   ESOPHAGOGASTRODUODENOSCOPY (EGD) WITH PROPOFOL N/A 08/04/2022   Procedure: ESOPHAGOGASTRODUODENOSCOPY (EGD) WITH PROPOFOL;  Surgeon: Charna Elizabeth, MD;  Location: [facility] ENDOSCOPY;  Service: Gastroenterology;  Laterality: N/A;   IR IMAGING GUIDED PORT INSERTION  08/06/2023     Outpatient Encounter Medications as of 09/16/2023  Medication Sig Note   albuterol (VENTOLIN HFA) 108 (90 Base) MCG/ACT inhaler Inhale 2 puffs into the lungs every 6 (six) hours as needed for wheezing or shortness of breath. 03/05/2023: Prn    ascorbic acid (VITAMIN C) 500 MG tablet Take 500 mg  by mouth daily.    Calcium Carbonate-Vitamin D (CALCIUM 500/D) 500-125 MG-UNIT TABS Take 1 tablet by mouth daily.    cyanocobalamin 1000 MCG tablet Take 2 tablets (2,000 mcg total) by mouth daily.    cyclobenzaprine (FLEXERIL) 10 MG tablet Take 1 tablet (10 mg total) by mouth 3 (three) times daily as needed for muscle spasms.    fluticasone (FLONASE) 50 MCG/ACT nasal spray Place 2 sprays into both nostrils daily.     hydrochlorothiazide (HYDRODIURIL) 25 MG tablet TAKE 1/2 TABLET (12.5 MG TOTAL) BY MOUTH DAILY.    lidocaine-prilocaine (EMLA) cream Apply to affected area once    lisinopril (ZESTRIL) 20 MG tablet Take 1 tablet (20 mg total) by mouth daily.    ondansetron (ZOFRAN) 8 MG tablet Take 1 tablet (8 mg total) by mouth every 8 (eight) hours as needed for nausea or vomiting.    predniSONE (DELTASONE) 5 MG tablet Take 1 tablet (5 mg total) by mouth in the morning and at bedtime.    prochlorperazine (COMPAZINE) 10 MG tablet Take 1 tablet (10 mg total) by mouth every 6 (six) hours as needed for nausea or vomiting.    sodium chloride (OCEAN) 0.65 % SOLN nasal spray Place 1 spray into both nostrils as needed for congestion.    tadalafil (CIALIS) 20 MG tablet Take 1 tablet (20 mg total) by mouth daily as needed.    tamsulosin (FLOMAX) 0.4 MG CAPS capsule TAKE 1 CAPSULE (0.4 MG TOTAL) BY MOUTH DAILY.    traMADol (ULTRAM) 50 MG tablet Take 1 - 2 tablets (50 - 100 mg total) by mouth every 6 hours as needed for moderate pain or severe pain.    VIAGRA 25 MG tablet Take 1 tablet (25 mg total) by mouth daily as needed for erectile dysfunction.    aspirin EC 81 MG tablet Take 81 mg by mouth daily. (Patient not taking: Reported on 09/16/2023) 08/13/2023: On hold during treatment   No facility-administered encounter medications on file as of 09/16/2023.     Today's Vitals   09/16/23 0858 09/16/23 0900  BP: 102/70   Pulse: 95   Resp: 17   Temp: 98.8 F (37.1 C)   TempSrc: Oral   SpO2: 100%   Weight: 242 lb 11.2 oz (110.1 kg)   PainSc:  0-No pain   Body mass index is 34.82 kg/m.   PHYSICAL EXAM GENERAL:alert, no distress and comfortable SKIN: no rash  EYES: sclera clear LUNGS: clear with normal breathing effort HEART: regular rate & rhythm, no lower extremity edema ABDOMEN: abdomen soft, non-tender and normal bowel sounds NEURO: alert & oriented x 3 with fluent speech, no focal motor deficits PAC without  erythema    CBC    Component Value Date/Time   WBC 6.3 09/16/2023 0827   WBC 3.9 11/21/2022 1459   RBC 3.37 (L) 09/16/2023 0827   HGB 9.7 (L) 09/16/2023 0827   HGB 10.8 (L) 10/22/2022 1548   HCT 30.1 (L) 09/16/2023 0827   HCT 32.0 (L) 10/22/2022 1548   PLT 248 09/16/2023 0827   PLT 275 10/22/2022 1548   MCV 89.3 09/16/2023 0827   MCV 82 10/22/2022 1548   MCH 28.8 09/16/2023 0827   MCHC 32.2 09/16/2023 0827   RDW 16.8 (H) 09/16/2023 0827   RDW 14.0 10/22/2022 1548   LYMPHSABS 1.3 09/16/2023 0827   LYMPHSABS 2.0 06/18/2020 0921   MONOABS 1.0 09/16/2023 0827   EOSABS 0.0 09/16/2023 0827   EOSABS 0.2 06/18/2020 0921   BASOSABS 0.1  09/16/2023 0827   BASOSABS 0.0 06/18/2020 0921     CMP     Component Value Date/Time   NA 139 09/16/2023 0827   NA 142 10/22/2022 1548   K 3.7 09/16/2023 0827   CL 106 09/16/2023 0827   CO2 26 09/16/2023 0827   GLUCOSE 101 (H) 09/16/2023 0827   BUN 23 (H) 09/16/2023 0827   BUN 19 10/22/2022 1548   CREATININE 1.35 (H) 09/16/2023 0827   CREATININE 1.39 (H) 10/28/2017 0958   CALCIUM 9.4 09/16/2023 0827   PROT 7.0 09/16/2023 0827   PROT 6.6 10/22/2022 1548   ALBUMIN 3.9 09/16/2023 0827   ALBUMIN 3.9 10/22/2022 1548   AST 9 (L) 09/16/2023 0827   ALT 9 09/16/2023 0827   ALKPHOS 407 (H) 09/16/2023 0827   BILITOT 0.3 09/16/2023 0827   GFRNONAA >60 09/16/2023 0827   GFRAA 65 06/18/2020 0921     ASSESSMENT & PLAN:Letrell R Leonor is a 57 y.o. male with    Prostate cancer, stage IV with nodal metastasis, castration resistant -Diagnosed in 2020, initially presented with Gleason score 9 and PSA 42 -he started ADT in 2020, initially responded with PSA dropped to 2.77 in November 2021.  PSA started rising and went to 9.3 in November 2022.  Patient was not very compliant with his treatment. -S/p palliative radiation to the left chest wall and left supraclavicular nodes in 2023. -He started Xtandi 160 mg daily in July 2023. He responded well and PSA <  1 -06/17/2023 rising PSA 5.73, and he has developed low back pain and bilateral hip pain, concerning for disease progression. -He underwent restaging PSMA PET scan on 07/16/23 and compared to his previous PET scan from 2023, which showed mixed response, with worsening bone metastasis in pelvis and bilateral hip, but the improvement in the adenopathy and bone mets in lower neck and upper chest, probably partially related to previous radiation. -His previously genetic testing was negative -his PET scan from July 21, 2023 showed mixed response, but overall worsening bone metastasis. PSA 13 -Began palliative chemo with Taxotere and prednisone q. 21 days on 08/05/2023 with GCSF on day 3 -Mr. Dimalanta appears stable. S/p C2 Taxotere, tolerating well with mild fatigue and taste change. Maintaining weight. Able to recover and function with adequate PS.  -He has bone pain from GCSF, but bone pain in low back/hips resolved after starting chemo, c/w clinical response to treatment. Today's PSA is pending   -Labs reviewed, adequate to proceed with C3 Taxotere today, no dose adjustments, and GCSF on day 3 -Continues androgen deprivation q6 months per Dr. Mena Goes -F/up and C4 in 3 weeks   Metastasis to bone -Due to extensive bone metastasis, he is at high risk for fracture -Continue vitamin D and calcium supplement -Plan to start Xgeva or Zometa after he completes upcoming dental work -low back/hip pain resolved on taxotere   PLAN: -Labs reviewed, will f/up pending PSA -C3 taxotere today (no dose adjustments) + IVF for soft BP and elevated Scr -GCSF on day 3 -F/up and C4 in 3 weeks as scheduled    All questions were answered. The patient knows to call the clinic with any problems, questions or concerns. No barriers to learning were detected.   Santiago Glad, NP-C 09/16/2023

## 2023-09-16 ENCOUNTER — Encounter: Payer: Self-pay | Admitting: Nurse Practitioner

## 2023-09-16 ENCOUNTER — Inpatient Hospital Stay: Payer: Medicaid Other

## 2023-09-16 ENCOUNTER — Inpatient Hospital Stay (HOSPITAL_BASED_OUTPATIENT_CLINIC_OR_DEPARTMENT_OTHER): Payer: Medicaid Other | Admitting: Nurse Practitioner

## 2023-09-16 ENCOUNTER — Other Ambulatory Visit: Payer: Self-pay

## 2023-09-16 VITALS — BP 110/69 | HR 94 | Resp 16

## 2023-09-16 DIAGNOSIS — C778 Secondary and unspecified malignant neoplasm of lymph nodes of multiple regions: Secondary | ICD-10-CM

## 2023-09-16 DIAGNOSIS — C61 Malignant neoplasm of prostate: Secondary | ICD-10-CM

## 2023-09-16 DIAGNOSIS — Z5189 Encounter for other specified aftercare: Secondary | ICD-10-CM | POA: Diagnosis not present

## 2023-09-16 DIAGNOSIS — Z5111 Encounter for antineoplastic chemotherapy: Secondary | ICD-10-CM | POA: Diagnosis not present

## 2023-09-16 LAB — CBC WITH DIFFERENTIAL (CANCER CENTER ONLY)
Abs Immature Granulocytes: 0.04 10*3/uL (ref 0.00–0.07)
Basophils Absolute: 0.1 10*3/uL (ref 0.0–0.1)
Basophils Relative: 1 %
Eosinophils Absolute: 0 10*3/uL (ref 0.0–0.5)
Eosinophils Relative: 1 %
HCT: 30.1 % — ABNORMAL LOW (ref 39.0–52.0)
Hemoglobin: 9.7 g/dL — ABNORMAL LOW (ref 13.0–17.0)
Immature Granulocytes: 1 %
Lymphocytes Relative: 21 %
Lymphs Abs: 1.3 10*3/uL (ref 0.7–4.0)
MCH: 28.8 pg (ref 26.0–34.0)
MCHC: 32.2 g/dL (ref 30.0–36.0)
MCV: 89.3 fL (ref 80.0–100.0)
Monocytes Absolute: 1 10*3/uL (ref 0.1–1.0)
Monocytes Relative: 15 %
Neutro Abs: 3.9 10*3/uL (ref 1.7–7.7)
Neutrophils Relative %: 61 %
Platelet Count: 248 10*3/uL (ref 150–400)
RBC: 3.37 MIL/uL — ABNORMAL LOW (ref 4.22–5.81)
RDW: 16.8 % — ABNORMAL HIGH (ref 11.5–15.5)
WBC Count: 6.3 10*3/uL (ref 4.0–10.5)
nRBC: 0 % (ref 0.0–0.2)

## 2023-09-16 LAB — CMP (CANCER CENTER ONLY)
ALT: 9 U/L (ref 0–44)
AST: 9 U/L — ABNORMAL LOW (ref 15–41)
Albumin: 3.9 g/dL (ref 3.5–5.0)
Alkaline Phosphatase: 407 U/L — ABNORMAL HIGH (ref 38–126)
Anion gap: 7 (ref 5–15)
BUN: 23 mg/dL — ABNORMAL HIGH (ref 6–20)
CO2: 26 mmol/L (ref 22–32)
Calcium: 9.4 mg/dL (ref 8.9–10.3)
Chloride: 106 mmol/L (ref 98–111)
Creatinine: 1.35 mg/dL — ABNORMAL HIGH (ref 0.61–1.24)
GFR, Estimated: >60 mL/min (ref >60)
Glucose, Bld: 101 mg/dL — ABNORMAL HIGH (ref 70–99)
Potassium: 3.7 mmol/L (ref 3.5–5.1)
Sodium: 139 mmol/L (ref 135–145)
Total Bilirubin: 0.3 mg/dL (ref 0.3–1.2)
Total Protein: 7 g/dL (ref 6.5–8.1)

## 2023-09-16 MED ORDER — SODIUM CHLORIDE 0.9 % IV SOLN: Freq: Once | INTRAVENOUS | Status: AC

## 2023-09-16 MED ORDER — SODIUM CHLORIDE 0.9% FLUSH
10.0000 mL | INTRAVENOUS | Status: DC | PRN
  Administered 2023-09-16: 10 mL

## 2023-09-16 MED ORDER — SODIUM CHLORIDE 0.9 % IV SOLN
10.0000 mg | Freq: Once | INTRAVENOUS | Status: AC
  Administered 2023-09-16: 10 mg via INTRAVENOUS
  Filled 2023-09-16: qty 10

## 2023-09-16 MED ORDER — HEPARIN SOD (PORK) LOCK FLUSH 100 UNIT/ML IV SOLN
500.0000 [IU] | Freq: Once | INTRAVENOUS | Status: AC | PRN
  Administered 2023-09-16: 500 [IU]

## 2023-09-16 MED ORDER — SODIUM CHLORIDE 0.9 % IV SOLN
75.0000 mg/m2 | Freq: Once | INTRAVENOUS | Status: AC
  Administered 2023-09-16: 176 mg via INTRAVENOUS
  Filled 2023-09-16: qty 17.6

## 2023-09-16 NOTE — Patient Instructions (Signed)
[facility]  Discharge Instructions: Thank you for choosing [facility] to provide your oncology and hematology care.   If you have a lab appointment with the Cancer Center, please go directly to the Cancer Center and check in at the registration area.   Wear comfortable clothing and clothing appropriate for easy access to any Portacath or PICC line.   We strive to give you quality time with your provider. You may need to reschedule your appointment if you arrive late (15 or more minutes).  Arriving late affects you and other patients whose appointments are after yours.  Also, if you miss three or more appointments without notifying the office, you may be dismissed from the clinic at the provider's discretion.      For prescription refill requests, have your pharmacy contact our office and allow 72 hours for refills to be completed.    Today you received the following chemotherapy and/or immunotherapy agents: Docetaxel      To help prevent nausea and vomiting after your treatment, we encourage you to take your nausea medication as directed.  BELOW ARE SYMPTOMS THAT SHOULD BE REPORTED IMMEDIATELY: *FEVER GREATER THAN 100.4 F (38 C) OR HIGHER *CHILLS OR SWEATING *NAUSEA AND VOMITING THAT IS NOT CONTROLLED WITH YOUR NAUSEA MEDICATION *UNUSUAL SHORTNESS OF BREATH *UNUSUAL BRUISING OR BLEEDING *URINARY PROBLEMS (pain or burning when urinating, or frequent urination) *BOWEL PROBLEMS (unusual diarrhea, constipation, pain near the anus) TENDERNESS IN MOUTH AND THROAT WITH OR WITHOUT PRESENCE OF ULCERS (sore throat, sores in mouth, or a toothache) UNUSUAL RASH, SWELLING OR PAIN  UNUSUAL VAGINAL DISCHARGE OR ITCHING   Items with * indicate a potential emergency and should be followed up as soon as possible or go to the Emergency Department if any problems should occur.  Please show the CHEMOTHERAPY ALERT CARD or IMMUNOTHERAPY ALERT CARD at  check-in to the Emergency Department and triage nurse.  Should you have questions after your visit or need to cancel or reschedule your appointment, please contact [facility]  Dept: [phone]  and follow the prompts.  Office hours are 8:00 a.m. to 4:30 p.m. Monday - Friday. Please note that voicemails left after 4:00 p.m. may not be returned until the following business day.  We are closed weekends and major holidays. You have access to a nurse at all times for urgent questions. Please call the main number to the clinic Dept: [phone] and follow the prompts.   For any non-urgent questions, you may also contact your provider using MyChart. We now offer e-Visits for anyone 34 and older to request care online for non-urgent symptoms. For details visit [email or web address].   Also download the MyChart app! Go to the app store, search "MyChart", open the app, select [facility], and log in with your MyChart username and password.  Docetaxel Injection What is this medication? DOCETAXEL (doe se TAX el) treats some types of cancer. It works by slowing down the growth of cancer cells. This medicine may be used for other purposes; ask your health care provider or pharmacist if you have questions. COMMON BRAND NAME(S): Docefrez, Docivyx, Taxotere What should I tell my care team before I take this medication? They need to know if you have any of these conditions: Kidney disease Liver disease Low white blood cell levels Tingling of the fingers or toes or other nerve disorder An unusual or allergic reaction to docetaxel, polysorbate 80, other medications, foods, dyes,  or preservatives Pregnant or trying to get pregnant Breast-feeding How should I use this medication? This medication is injected into a vein. It is given by your care team in a hospital or clinic setting. Talk to your care team about the use of this medication in children. Special care may be  needed. Overdosage: If you think you have taken too much of this medicine contact a poison control center or emergency room at once. NOTE: This medicine is only for you. Do not share this medicine with others. What if I miss a dose? Keep appointments for follow-up doses. It is important not to miss your dose. Call your care team if you are unable to keep an appointment. What may interact with this medication? Do not take this medication with any of the following: Live virus vaccines This medication may also interact with the following: Certain antibiotics, such as clarithromycin, telithromycin Certain antivirals for HIV or hepatitis Certain medications for fungal infections, such as itraconazole, ketoconazole, voriconazole Grapefruit juice Nefazodone Supplements, such as St. John's wort This list may not describe all possible interactions. Give your health care provider a list of all the medicines, herbs, non-prescription drugs, or dietary supplements you use. Also tell them if you smoke, drink alcohol, or use illegal drugs. Some items may interact with your medicine. What should I watch for while using this medication? This medication may make you feel generally unwell. This is not uncommon as chemotherapy can affect healthy cells as well as cancer cells. Report any side effects. Continue your course of treatment even though you feel ill unless your care team tells you to stop. You may need blood work done while you are taking this medication. This medication can cause serious side effects and infusion reactions. To reduce the risk, your care team may give you other medications to take before receiving this one. Be sure to follow the directions from your care team. This medication may increase your risk of getting an infection. Call your care team for advice if you get a fever, chills, sore throat, or other symptoms of a cold or flu. Do not treat yourself. Try to avoid being around people who  are sick. Avoid taking medications that contain aspirin, acetaminophen, ibuprofen, naproxen, or ketoprofen unless instructed by your care team. These medications may hide a fever. Be careful brushing or flossing your teeth or using a toothpick because you may get an infection or bleed more easily. If you have any dental work done, tell your dentist you are receiving this medication. Some products may contain alcohol. Ask your care team if this medication contains alcohol. Be sure to tell all care teams you are taking this medicine. Certain medications, like metronidazole and disulfiram, can cause an unpleasant reaction when taken with alcohol. The reaction includes flushing, headache, nausea, vomiting, sweating, and increased thirst. The reaction can last from 30 minutes to several hours. This medication may affect your coordination, reaction time, or judgement. Do not drive or operate machinery until you know how this medication affects you. Sit up or stand slowly to reduce the risk of dizzy or fainting spells. Drinking alcohol with this medication can increase the risk of these side effects. Talk to your care team about your risk of cancer. You may be more at risk for certain types of cancer if you take this medication. Talk to your care team if you wish to become pregnant or think you might be pregnant. This medication can cause serious birth defects if taken during  pregnancy or if you get pregnant within 2 months after stopping therapy. A negative pregnancy test is required before starting this medication. A reliable form of contraception is recommended while taking this medication and for 2 months after stopping it. Talk to your care team about reliable forms of contraception. Do not breast-feed while taking this medication and for 1 week after stopping therapy. Use a condom during sex and for 4 months after stopping therapy. Tell your care team right away if you think your partner might be pregnant.  This medication can cause serious birth defects. This medication may cause infertility. Talk to your care team if you are concerned about your fertility. What side effects may I notice from receiving this medication? Side effects that you should report to your care team as soon as possible: Allergic reactions--skin rash, itching, hives, swelling of the face, lips, tongue, or throat Change in vision such as blurry vision, seeing halos around lights, vision loss Infection--fever, chills, cough, or sore throat Infusion reactions--chest pain, shortness of breath or trouble breathing, feeling faint or lightheaded Low red blood cell level--unusual weakness or fatigue, dizziness, headache, trouble breathing Pain, tingling, or numbness in the hands or feet Painful swelling, warmth, or redness of the skin, blisters or sores at the infusion site Redness, blistering, peeling, or loosening of the skin, including inside the mouth Sudden or severe stomach pain, bloody diarrhea, fever, nausea, vomiting Swelling of the ankles, hands, or feet Tumor lysis syndrome (TLS)--nausea, vomiting, diarrhea, decrease in the amount of urine, dark urine, unusual weakness or fatigue, confusion, muscle pain or cramps, fast or irregular heartbeat, joint pain Unusual bruising or bleeding Side effects that usually do not require medical attention (report to your care team if they continue or are bothersome): Change in nail shape, thickness, or color Change in taste Hair loss Increased tears This list may not describe all possible side effects. Call your doctor for medical advice about side effects. You may report side effects to FDA at 1-800-FDA-1088. Where should I keep my medication? This medication is given in a hospital or clinic. It will not be stored at home. NOTE: This sheet is a summary. It may not cover all possible information. If you have questions about this medicine, talk to your doctor, pharmacist, or health care  provider.  2024 Elsevier/Gold Standard (2022-02-13 00:00:00)

## 2023-09-16 NOTE — Progress Notes (Signed)
Patient receiving IVF today- ok to run through treatment per Santiago Glad, NP.

## 2023-09-17 LAB — PROSTATE-SPECIFIC AG, SERUM (LABCORP): Prostate Specific Ag, Serum: 8 ng/mL — ABNORMAL HIGH (ref 0.0–4.0)

## 2023-09-18 ENCOUNTER — Inpatient Hospital Stay: Payer: Medicaid Other

## 2023-09-18 VITALS — BP 123/79 | HR 115 | Temp 98.4°F | Resp 18

## 2023-09-18 DIAGNOSIS — C61 Malignant neoplasm of prostate: Secondary | ICD-10-CM

## 2023-09-18 DIAGNOSIS — Z5189 Encounter for other specified aftercare: Secondary | ICD-10-CM | POA: Diagnosis not present

## 2023-09-18 DIAGNOSIS — C778 Secondary and unspecified malignant neoplasm of lymph nodes of multiple regions: Secondary | ICD-10-CM

## 2023-09-18 DIAGNOSIS — Z5111 Encounter for antineoplastic chemotherapy: Secondary | ICD-10-CM | POA: Diagnosis not present

## 2023-09-18 MED ORDER — PEGFILGRASTIM-CBQV 6 MG/0.6ML ~~LOC~~ SOSY
6.0000 mg | PREFILLED_SYRINGE | Freq: Once | SUBCUTANEOUS | Status: AC
  Administered 2023-09-18: 6 mg via SUBCUTANEOUS
  Filled 2023-09-18: qty 0.6

## 2023-09-18 NOTE — Patient Instructions (Signed)

## 2023-09-23 DIAGNOSIS — C61 Malignant neoplasm of prostate: Secondary | ICD-10-CM | POA: Diagnosis not present

## 2023-09-30 DIAGNOSIS — C7951 Secondary malignant neoplasm of bone: Secondary | ICD-10-CM | POA: Diagnosis not present

## 2023-09-30 DIAGNOSIS — C61 Malignant neoplasm of prostate: Secondary | ICD-10-CM | POA: Diagnosis not present

## 2023-10-05 NOTE — Progress Notes (Unsigned)
Palliative Medicine [facility]  [phone]) [phone] [phone]) [phone]   Name: Craig Collins Date: 10/05/2023 MRN: [id number]  DOB: [birth year]  Patient Care Team: Ivonne Andrew, NP as PCP - General (Pulmonary Disease) Cherlyn Cushing, RN as Oncology Nurse Navigator Malachy Mood, MD as Consulting Physician (Hematology and Oncology)   INTERVAL HISTORY: Craig Collins is a 57 y.o. 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 Palliative ask to see for symptom and pain management and goals of care.   SOCIAL HISTORY:     reports that he has never smoked. He has never been exposed to tobacco smoke. He has never used smokeless tobacco. He reports that he does not currently use alcohol. He reports that he does not use drugs.  ADVANCE DIRECTIVES:  None on file  CODE STATUS: Full code  PAST MEDICAL HISTORY: Past Medical History:  Diagnosis Date   Benign prostate hyperplasia    Elevated PSA    Erectile dysfunction    Hypertension    Obesity (BMI 35.0-39.9 without comorbidity)    Prostate cancer (HCC) 05/2019   Sleep apnea    Urinary retention     ALLERGIES:  has No Known Allergies.  MEDICATIONS:  Current Outpatient Medications  Medication Sig Dispense Refill   albuterol (VENTOLIN HFA) 108 (90 Base) MCG/ACT inhaler Inhale 2 puffs into the lungs every 6 (six) hours as needed for wheezing or shortness of breath. 18 g 6   ascorbic acid (VITAMIN C) 500 MG tablet Take 500 mg by mouth daily.     aspirin EC 81 MG tablet Take 81 mg by mouth daily. (Patient not taking: Reported on 09/16/2023)     Calcium Carbonate-Vitamin D (CALCIUM 500/D) 500-125 MG-UNIT TABS Take 1 tablet by mouth daily.     cyanocobalamin 1000 MCG tablet Take 2 tablets (2,000 mcg total) by mouth daily. 30 tablet 0   cyclobenzaprine (FLEXERIL)  10 MG tablet Take 1 tablet (10 mg total) by mouth 3 (three) times daily as needed for muscle spasms. 30 tablet 1   fluticasone (FLONASE) 50 MCG/ACT nasal spray Place 2 sprays into both nostrils daily. 16 g 6   hydrochlorothiazide (HYDRODIURIL) 25 MG tablet TAKE 1/2 TABLET (12.5 MG TOTAL) BY MOUTH DAILY. 45 tablet 3   lidocaine-prilocaine (EMLA) cream Apply to affected area once 30 g 3   lisinopril (ZESTRIL) 20 MG tablet Take 1 tablet (20 mg total) by mouth daily. 90 tablet 3   ondansetron (ZOFRAN) 8 MG tablet Take 1 tablet (8 mg total) by mouth every 8 (eight) hours as needed for nausea or vomiting. 30 tablet 1   predniSONE (DELTASONE) 5 MG tablet Take 1 tablet (5 mg total) by mouth in the morning and at bedtime. 60 tablet 11   prochlorperazine (COMPAZINE) 10 MG tablet Take 1 tablet (10 mg total) by mouth every 6 (six) hours as needed for nausea or vomiting. 30 tablet 1   sodium chloride (OCEAN) 0.65 % SOLN nasal spray Place 1 spray into both nostrils as needed for congestion. 30 mL 0   tadalafil (CIALIS) 20 MG tablet Take 1 tablet (20 mg total) by mouth daily as needed. 10 tablet 11   tamsulosin (FLOMAX) 0.4 MG CAPS capsule TAKE 1 CAPSULE (0.4 MG TOTAL) BY MOUTH DAILY. 30 capsule 11   traMADol (ULTRAM) 50 MG tablet Take 1 - 2 tablets (50 - 100 mg total) by  mouth every 6 hours as needed for moderate pain or severe pain. 90 tablet 0   VIAGRA 25 MG tablet Take 1 tablet (25 mg total) by mouth daily as needed for erectile dysfunction. 10 tablet 0   No current facility-administered medications for this visit.    VITAL SIGNS: There were no vitals taken for this visit. There were no vitals filed for this visit.  Estimated body mass index is 34.82 kg/m as calculated from the following:   Height as of 09/04/23: 5\' 10"  (1.778 m).   Weight as of 09/16/23: 242 lb 11.2 oz (110.1 kg).   PERFORMANCE STATUS (ECOG) : 1 - Symptomatic but completely ambulatory  Assessment NAD RRR AAO x4  IMPRESSION: I  saw Mr. [name] during his infusion.  No acute distress.  Wife is present.  Patient doing well overall with main concern of some new neuropathic discomfort.  Denies nausea, vomiting, constipation, or diarrhea.  Appetite is slowly improving.  Some days are better than others.  Is taking things one day at a time.  Pain Mr. Filter reports his pain is much better since starting treatments. Some days are better than others. Is no longer requiring around the clock medications.   We discussed his current regimen: [place] is no longer requiring Xtampza 9mg . Able to wean and discontinue appropriately.   Reports concerns of numbness and tingling in hands or feet.  He was seen by Herbert Seta, NP and started on gabapentin at bedtime.  We will continue to closely monitor and support in collaboration with oncology.  2. Constipation Controlled with daily regimen  3. Goals of Care  04/16/23-We discussed [name] current illness and what it means in the larger context of his on-going co-morbidities. Natural disease trajectory and expectations were discussed. We discussed the importance of continued conversation with family and their medical providers regarding overall plan of care and treatment options, ensuring decisions are within the context of the patients values and GOCs.   [name] relates that it has become more difficult for him to leave home, mostly due to the pain and fatigue. There are other factors as well, such as the sweating that he experiences without warning. Patient reports this makes him feel "blue." He is hopeful that an improvement in his pain will translate into an improvement in his energy levels.    [name] relies on his faith as a source of strength in coping with his illness. He states that he does not want to claim having cancer and intentionally turns off the television when there is content on cancer or death. He is trying to enjoy each day of his life and not look at cancer  as a death sentence.   I discussed the importance of continued conversation with family and their medical providers regarding overall plan of care and treatment options, ensuring decisions are within the context of the patients values and GOCs.  PLAN:  Gabapentin per Herbert Seta, NP.  Encourage pacing of activity and taking rest breaks. Senna daily for bowel regimen Miralax daily for bowel regimen Will continue to support as needed for symptom management. Palliative will plan to see patient back in 4-6  weeks in collaboration to other oncology appointments.   Patient expressed understanding and was in agreement with this plan. He also understands that He can call the clinic at any time with any questions, concerns, or complaints.   Any controlled substances utilized were prescribed in the context of palliative care. PDMP has been reviewed.  Visit consisted of counseling and education dealing with the complex and emotionally intense issues of symptom management and palliative care in the setting of serious and potentially life-threatening illness.Greater than 50%  of this time was spent counseling and coordinating care related to the above assessment and plan.  Willette Alma, AGPCNP-BC  Palliative Medicine Team/[facility]  *Please note that this is a verbal dictation therefore any spelling or grammatical errors are due to the "Dragon [employer] One" system interpretation.

## 2023-10-06 MED FILL — Dexamethasone Sodium Phosphate Inj 100 MG/10ML: INTRAMUSCULAR | Qty: 1 | Status: AC

## 2023-10-06 NOTE — Progress Notes (Unsigned)
Patient Care Team: Ivonne Andrew, NP as PCP - General (Pulmonary Disease) Cherlyn Cushing, RN as Oncology Nurse Navigator Malachy Mood, MD as Consulting Physician (Hematology and Oncology)  Clinic Day:  10/06/2023  Referring physician: Malachy Mood, MD  ASSESSMENT & PLAN:   Assessment & Plan: Prostate cancer [facility]) -Stage IVb with node metastasis, metastasis to bone, castration resistant. -Diagnosed in 2020, initially presented with Gleason score 9 and PSA 42 -he started ADT in 2020, initially responded with PSA dropped to 2.77 in November 2021.  PSA started rising and went to 9.3 in November 2022.  Patient was not very compliant with his treatment. -He received palliative radiation to the left chest wall and left supraclavicular nodes in 2023. -He started Xtandi 160 mg daily in July 2023. He responded well and PSA has been less than 1 since then. -During his recent visit with [facility] Dr.Dr. Mena Goes on 06/17/2023, he was found to have elevated PSA 5.73, and he has developed low back pain and bilateral hip pain, concerning for disease progression. -He underwent restaging PSMA PET scan on July 16, 2023, which was compared with previous PET scan from 2023. PET scan from 2023 showed mixed response, with worsening bone metastasis in pelvis and bilateral hip, but improvement in the adenopathy and bone mets in lower neck and upper chest, probably partially related to previous radiation. -His previous genetic testing was negative. Next generation sequencing on his initial prostate biopsy sample has been requested. -his PET scan from July 21, 2023 showed mixed response, but overall worsening bone metastasis.  It was recommended to change treatment to chemotherapy docetaxel every 3 weeks, for up to 10 cycles. -Goal of chemotherapy is palliative, for disease control and prolong his life. 08/05/2023 he received initial dose docetaxel and is taking prednisone daily. 10/07/2023 - presents for Cycle 4 day 1 docetaxel     The patient understands the plans discussed today and is in agreement with them.  He knows to contact our office if he develops concerns prior to his next appointment.  I provided *** minutes of face-to-face time during this encounter and > 50% was spent counseling as documented under my assessment and plan.    Carlean Jews, NP  [place] [facility] AT [facility] [address] [place] [address] Dept: [phone] Dept Fax: [phone]   No orders of the defined types were placed in this encounter.     CHIEF COMPLAINT:  CC: malignant neoplasm of prostate with multiple metastases.  Current Treatment:  docetaxel and prednisone every 21 days starting 08/05/2023  INTERVAL HISTORY:  Dujuan is here today for repeat clinical assessment. He denies fevers or chills. He denies pain. His appetite is good. His weight {Weight change:10426}. Last seen by Clayborn Heron, NP on 09/16/2023. Today, he presents for Cycle 4 day 1. Scheduled for G-CSF on Friday. Previously has had moderate bone pain associated with injection.   I have reviewed the past medical history, past surgical history, social history and family history with the patient and they are unchanged from previous note.  ALLERGIES:  has No Known Allergies.  MEDICATIONS:  Current Outpatient Medications  Medication Sig Dispense Refill   albuterol (VENTOLIN HFA) 108 (90 Base) MCG/ACT inhaler Inhale 2 puffs into the lungs every 6 (six) hours as needed for wheezing or shortness of breath. 18 g 6   ascorbic acid (VITAMIN C) 500 MG tablet Take 500 mg by mouth daily.     aspirin EC 81  MG tablet Take 81 mg by mouth daily. (Patient not taking: Reported on 09/16/2023)     Calcium Carbonate-Vitamin D (CALCIUM 500/D) 500-125 MG-UNIT TABS Take 1 tablet by mouth daily.     cyanocobalamin 1000 MCG tablet Take 2 tablets (2,000 mcg total) by mouth daily. 30 tablet 0   cyclobenzaprine (FLEXERIL) 10 MG tablet  Take 1 tablet (10 mg total) by mouth 3 (three) times daily as needed for muscle spasms. 30 tablet 1   fluticasone (FLONASE) 50 MCG/ACT nasal spray Place 2 sprays into both nostrils daily. 16 g 6   hydrochlorothiazide (HYDRODIURIL) 25 MG tablet TAKE 1/2 TABLET (12.5 MG TOTAL) BY MOUTH DAILY. 45 tablet 3   lidocaine-prilocaine (EMLA) cream Apply to affected area once 30 g 3   lisinopril (ZESTRIL) 20 MG tablet Take 1 tablet (20 mg total) by mouth daily. 90 tablet 3   ondansetron (ZOFRAN) 8 MG tablet Take 1 tablet (8 mg total) by mouth every 8 (eight) hours as needed for nausea or vomiting. 30 tablet 1   predniSONE (DELTASONE) 5 MG tablet Take 1 tablet (5 mg total) by mouth in the morning and at bedtime. 60 tablet 11   prochlorperazine (COMPAZINE) 10 MG tablet Take 1 tablet (10 mg total) by mouth every 6 (six) hours as needed for nausea or vomiting. 30 tablet 1   sodium chloride (OCEAN) 0.65 % SOLN nasal spray Place 1 spray into both nostrils as needed for congestion. 30 mL 0   tadalafil (CIALIS) 20 MG tablet Take 1 tablet (20 mg total) by mouth daily as needed. 10 tablet 11   tamsulosin (FLOMAX) 0.4 MG CAPS capsule TAKE 1 CAPSULE (0.4 MG TOTAL) BY MOUTH DAILY. 30 capsule 11   traMADol (ULTRAM) 50 MG tablet Take 1 - 2 tablets (50 - 100 mg total) by mouth every 6 hours as needed for moderate pain or severe pain. 90 tablet 0   VIAGRA 25 MG tablet Take 1 tablet (25 mg total) by mouth daily as needed for erectile dysfunction. 10 tablet 0   No current facility-administered medications for this visit.    HISTORY OF PRESENT ILLNESS:   Oncology History Overview Note   Cancer Staging  Prostate cancer [facility]) Staging form: Prostate, AJCC 8th Edition - Clinical: Stage IVB (cTX, cNX, pM1b, PSA: 42, Grade Group: 5) - Signed by Benjiman Core, MD on 07/08/2022 Prostate specific antigen (PSA) range: 20 or greater Histologic grading system: 5 grade system     Prostate cancer (HCC)  12/21/2019 Initial  Diagnosis   Prostate cancer (HCC)   07/08/2022 Cancer Staging   Staging form: Prostate, AJCC 8th Edition - Clinical: Stage IVB (cTX, cNX, pM1b, PSA: 42, Grade Group: 5) - Signed by Benjiman Core, MD on 07/08/2022 Prostate specific antigen (PSA) range: 20 or greater Histologic grading system: 5 grade system   Metastasis to bone (HCC)  07/08/2022 Initial Diagnosis   Metastasis to bone (HCC)   07/16/2023 PET scan    IMPRESSION: 1. Mixed response to therapy. 2. Interval decrease in size of the prostate gland with mild residual tracer uptake within the right posterior gland. 3. Interval decrease in size of thoracic and abdominal adenopathy. 4. Persistent, multifocal tracer avid bone metastases. Overall, the tracer avid lesions appear increased in size and degree of tracer uptake. Lesion within the right calvarium has resolved in the interval.    Miscellaneous   Foundation[facility]      Malignant neoplasm of prostate metastatic to lymph nodes of multiple sites [facility])  07/08/2022 Initial Diagnosis   Malignant neoplasm of prostate metastatic to lymph nodes of multiple sites [facility])   08/05/2023 -  Chemotherapy   Patient is on Treatment Plan : PROSTATE Docetaxel (75) + Prednisone q21d         REVIEW OF SYSTEMS:   Constitutional: Denies fevers, chills or abnormal weight loss Eyes: Denies blurriness of vision Ears, nose, mouth, throat, and face: Denies mucositis or sore throat Respiratory: Denies cough, dyspnea or wheezes Cardiovascular: Denies palpitation, chest discomfort or lower extremity swelling Gastrointestinal:  Denies nausea, heartburn or change in bowel habits Skin: Denies abnormal skin rashes Lymphatics: Denies new lymphadenopathy or easy bruising Neurological:Denies numbness, tingling or new weaknesses Behavioral/Psych: Mood is stable, no new changes  All other systems were reviewed with the patient and are negative.   VITALS:  There were no vitals taken for this visit.   Wt Readings from Last 3 Encounters:  09/16/23 242 lb 11.2 oz (110.1 kg)  09/04/23 238 lb (108 kg)  08/26/23 245 lb (111.1 kg)    There is no height or weight on file to calculate BMI.  Performance status (ECOG): {CHL ONC [id number]  PHYSICAL EXAM:   GENERAL:alert, no distress and comfortable SKIN: skin color, texture, turgor are normal, no rashes or significant lesions EYES: normal, Conjunctiva are pink and non-injected, sclera clear OROPHARYNX:no exudate, no erythema and lips, buccal mucosa, and tongue normal  NECK: supple, thyroid normal size, non-tender, without nodularity LYMPH:  no palpable lymphadenopathy in the cervical, axillary or inguinal LUNGS: clear to auscultation and percussion with normal breathing effort HEART: regular rate & rhythm and no murmurs and no lower extremity edema ABDOMEN:abdomen soft, non-tender and normal bowel sounds Musculoskeletal:no cyanosis of digits and no clubbing  NEURO: alert & oriented x 3 with fluent speech, no focal motor/sensory deficits  LABORATORY DATA:  I have reviewed the data as listed    Component Value Date/Time   NA 139 09/16/2023 0827   NA 142 10/22/2022 1548   K 3.7 09/16/2023 0827   CL 106 09/16/2023 0827   CO2 26 09/16/2023 0827   GLUCOSE 101 (H) 09/16/2023 0827   BUN 23 (H) 09/16/2023 0827   BUN 19 10/22/2022 1548   CREATININE 1.35 (H) 09/16/2023 0827   CREATININE 1.39 (H) 10/28/2017 0958   CALCIUM 9.4 09/16/2023 0827   PROT 7.0 09/16/2023 0827   PROT 6.6 10/22/2022 1548   ALBUMIN 3.9 09/16/2023 0827   ALBUMIN 3.9 10/22/2022 1548   AST 9 (L) 09/16/2023 0827   ALT 9 09/16/2023 0827   ALKPHOS 407 (H) 09/16/2023 0827   BILITOT 0.3 09/16/2023 0827   GFRNONAA >60 09/16/2023 0827   GFRAA 65 06/18/2020 0921    Lab Results  Component Value Date   WBC 6.3 09/16/2023   NEUTROABS 3.9 09/16/2023   HGB 9.7 (L) 09/16/2023   HCT 30.1 (L) 09/16/2023   MCV 89.3 09/16/2023   PLT 248 09/16/2023        RADIOGRAPHIC STUDIES: I have personally reviewed the radiological images as listed and agreed with the findings in the report. No results found.

## 2023-10-06 NOTE — Assessment & Plan Note (Signed)
-  Stage IVb with node metastasis, metastasis to bone, castration resistant. -Diagnosed in 2020, initially presented with Gleason score 9 and PSA 42 -he started ADT in 2020, initially responded with PSA dropped to 2.77 in November 2021.  PSA started rising and went to 9.3 in November 2022.  Patient was not very compliant with his treatment. -He received palliative radiation to the left chest wall and left supraclavicular nodes in 2023. -He started Xtandi 160 mg daily in July 2023. He responded well and PSA has been less than 1 since then. -During his recent visit with [facility] Dr.Dr. Mena Goes on 06/17/2023, he was found to have elevated PSA 5.73, and he has developed low back pain and bilateral hip pain, concerning for disease progression. -He underwent restaging PSMA PET scan on July 16, 2023, which was compared with previous PET scan from 2023. PET scan from 2023 showed mixed response, with worsening bone metastasis in pelvis and bilateral hip, but improvement in the adenopathy and bone mets in lower neck and upper chest, probably partially related to previous radiation. -His previous genetic testing was negative. Next generation sequencing on his initial prostate biopsy sample has been requested. -his PET scan from July 21, 2023 showed mixed response, but overall worsening bone metastasis.  It was recommended to change treatment to chemotherapy docetaxel every 3 weeks, for up to 10 cycles. -Goal of chemotherapy is palliative, for disease control and prolong his life. 08/05/2023 he received initial dose docetaxel and is taking prednisone daily. 10/07/2023 - presents for Cycle 4 day 1 docetaxel

## 2023-10-07 ENCOUNTER — Encounter: Payer: Self-pay | Admitting: Nurse Practitioner

## 2023-10-07 ENCOUNTER — Inpatient Hospital Stay: Payer: Medicaid Other

## 2023-10-07 ENCOUNTER — Inpatient Hospital Stay (HOSPITAL_BASED_OUTPATIENT_CLINIC_OR_DEPARTMENT_OTHER): Payer: Medicaid Other | Admitting: Nurse Practitioner

## 2023-10-07 ENCOUNTER — Other Ambulatory Visit: Payer: Self-pay

## 2023-10-07 ENCOUNTER — Inpatient Hospital Stay: Payer: Medicaid Other | Attending: Nurse Practitioner

## 2023-10-07 VITALS — HR 89

## 2023-10-07 VITALS — BP 118/78 | HR 106 | Temp 97.9°F | Resp 18 | Ht 70.0 in | Wt 249.9 lb

## 2023-10-07 DIAGNOSIS — R630 Anorexia: Secondary | ICD-10-CM

## 2023-10-07 DIAGNOSIS — Z515 Encounter for palliative care: Secondary | ICD-10-CM | POA: Diagnosis not present

## 2023-10-07 DIAGNOSIS — R530 Neoplastic (malignant) related fatigue: Secondary | ICD-10-CM | POA: Diagnosis not present

## 2023-10-07 DIAGNOSIS — C778 Secondary and unspecified malignant neoplasm of lymph nodes of multiple regions: Secondary | ICD-10-CM

## 2023-10-07 DIAGNOSIS — C779 Secondary and unspecified malignant neoplasm of lymph node, unspecified: Secondary | ICD-10-CM | POA: Insufficient documentation

## 2023-10-07 DIAGNOSIS — M792 Neuralgia and neuritis, unspecified: Secondary | ICD-10-CM

## 2023-10-07 DIAGNOSIS — C61 Malignant neoplasm of prostate: Secondary | ICD-10-CM

## 2023-10-07 DIAGNOSIS — Z5189 Encounter for other specified aftercare: Secondary | ICD-10-CM | POA: Insufficient documentation

## 2023-10-07 DIAGNOSIS — C7951 Secondary malignant neoplasm of bone: Secondary | ICD-10-CM | POA: Insufficient documentation

## 2023-10-07 DIAGNOSIS — Z5111 Encounter for antineoplastic chemotherapy: Secondary | ICD-10-CM | POA: Insufficient documentation

## 2023-10-07 LAB — CBC WITH DIFFERENTIAL (CANCER CENTER ONLY)
Abs Immature Granulocytes: 0.02 10*3/uL (ref 0.00–0.07)
Basophils Absolute: 0 10*3/uL (ref 0.0–0.1)
Basophils Relative: 1 %
Eosinophils Absolute: 0.1 10*3/uL (ref 0.0–0.5)
Eosinophils Relative: 1 %
HCT: 29.1 % — ABNORMAL LOW (ref 39.0–52.0)
Hemoglobin: 9.5 g/dL — ABNORMAL LOW (ref 13.0–17.0)
Immature Granulocytes: 0 %
Lymphocytes Relative: 15 %
Lymphs Abs: 0.8 10*3/uL (ref 0.7–4.0)
MCH: 29.3 pg (ref 26.0–34.0)
MCHC: 32.6 g/dL (ref 30.0–36.0)
MCV: 89.8 fL (ref 80.0–100.0)
Monocytes Absolute: 0.8 10*3/uL (ref 0.1–1.0)
Monocytes Relative: 15 %
Neutro Abs: 3.7 10*3/uL (ref 1.7–7.7)
Neutrophils Relative %: 68 %
Platelet Count: 262 10*3/uL (ref 150–400)
RBC: 3.24 MIL/uL — ABNORMAL LOW (ref 4.22–5.81)
RDW: 18.5 % — ABNORMAL HIGH (ref 11.5–15.5)
WBC Count: 5.4 10*3/uL (ref 4.0–10.5)
nRBC: 0 % (ref 0.0–0.2)

## 2023-10-07 LAB — CMP (CANCER CENTER ONLY)
ALT: 11 U/L (ref 0–44)
AST: 12 U/L — ABNORMAL LOW (ref 15–41)
Albumin: 3.5 g/dL (ref 3.5–5.0)
Alkaline Phosphatase: 215 U/L — ABNORMAL HIGH (ref 38–126)
Anion gap: 10 (ref 5–15)
BUN: 21 mg/dL — ABNORMAL HIGH (ref 6–20)
CO2: 24 mmol/L (ref 22–32)
Calcium: 9.2 mg/dL (ref 8.9–10.3)
Chloride: 106 mmol/L (ref 98–111)
Creatinine: 1.11 mg/dL (ref 0.61–1.24)
GFR, Estimated: >60 mL/min
Glucose, Bld: 101 mg/dL — ABNORMAL HIGH (ref 70–99)
Potassium: 3.6 mmol/L (ref 3.5–5.1)
Sodium: 140 mmol/L (ref 135–145)
Total Bilirubin: 0.3 mg/dL (ref 0.3–1.2)
Total Protein: 6.9 g/dL (ref 6.5–8.1)

## 2023-10-07 MED ORDER — SODIUM CHLORIDE 0.9 % IV SOLN
60.0000 mg/m2 | Freq: Once | INTRAVENOUS | Status: AC
  Administered 2023-10-07: 141 mg via INTRAVENOUS
  Filled 2023-10-07: qty 14.1

## 2023-10-07 MED ORDER — SODIUM CHLORIDE 0.9 % IV SOLN: Freq: Once | INTRAVENOUS | Status: AC

## 2023-10-07 MED ORDER — HEPARIN SOD (PORK) LOCK FLUSH 100 UNIT/ML IV SOLN
500.0000 [IU] | Freq: Once | INTRAVENOUS | Status: AC | PRN
  Administered 2023-10-07: 500 [IU]

## 2023-10-07 MED ORDER — SODIUM CHLORIDE 0.9% FLUSH
10.0000 mL | INTRAVENOUS | Status: DC | PRN
  Administered 2023-10-07: 10 mL

## 2023-10-07 MED ORDER — GABAPENTIN 100 MG PO CAPS
100.0000 mg | ORAL_CAPSULE | Freq: Every day | ORAL | 0 refills | Status: DC
  Filled 2023-10-07: qty 60, 30d supply, fill #0

## 2023-10-07 MED ORDER — NYSTATIN 100000 UNIT/GM EX CREA
1.0000 | TOPICAL_CREAM | Freq: Two times a day (BID) | CUTANEOUS | 1 refills | Status: AC
  Filled 2023-10-07: qty 30, 15d supply, fill #0

## 2023-10-07 MED ORDER — SODIUM CHLORIDE 0.9 % IV SOLN
10.0000 mg | Freq: Once | INTRAVENOUS | Status: AC
  Administered 2023-10-07: 10 mg via INTRAVENOUS
  Filled 2023-10-07: qty 10

## 2023-10-07 NOTE — Patient Instructions (Signed)
[place] [facility] AT [facility]  Discharge Instructions: Thank you for choosing [facility] to provide your oncology and hematology care.   If you have a lab appointment with the Cancer Center, please go directly to the Cancer Center and check in at the registration area.   Wear comfortable clothing and clothing appropriate for easy access to any Portacath or PICC line.   We strive to give you quality time with your provider. You may need to reschedule your appointment if you arrive late (15 or more minutes).  Arriving late affects you and other patients whose appointments are after yours.  Also, if you miss three or more appointments without notifying the office, you may be dismissed from the clinic at the provider's discretion.      For prescription refill requests, have your pharmacy contact our office and allow 72 hours for refills to be completed.    Today you received the following chemotherapy and/or immunotherapy agents: Docetaxel     To help prevent nausea and vomiting after your treatment, we encourage you to take your nausea medication as directed.  BELOW ARE SYMPTOMS THAT SHOULD BE REPORTED IMMEDIATELY: *FEVER GREATER THAN 100.4 F (38 C) OR HIGHER *CHILLS OR SWEATING *NAUSEA AND VOMITING THAT IS NOT CONTROLLED WITH YOUR NAUSEA MEDICATION *UNUSUAL SHORTNESS OF BREATH *UNUSUAL BRUISING OR BLEEDING *URINARY PROBLEMS (pain or burning when urinating, or frequent urination) *BOWEL PROBLEMS (unusual diarrhea, constipation, pain near the anus) TENDERNESS IN MOUTH AND THROAT WITH OR WITHOUT PRESENCE OF ULCERS (sore throat, sores in mouth, or a toothache) UNUSUAL RASH, SWELLING OR PAIN  UNUSUAL VAGINAL DISCHARGE OR ITCHING   Items with * indicate a potential emergency and should be followed up as soon as possible or go to the Emergency Department if any problems should occur.  Please show the CHEMOTHERAPY ALERT CARD or IMMUNOTHERAPY ALERT CARD at  check-in to the Emergency Department and triage nurse.  Should you have questions after your visit or need to cancel or reschedule your appointment, please contact [place] [facility]  Dept: [phone]  and follow the prompts.  Office hours are 8:00 a.m. to 4:30 p.m. Monday - Friday. Please note that voicemails left after 4:00 p.m. may not be returned until the following business day.  We are closed weekends and major holidays. You have access to a nurse at all times for urgent questions. Please call the main number to the clinic Dept: [phone] and follow the prompts.   For any non-urgent questions, you may also contact your provider using MyChart. We now offer e-Visits for anyone 11 and older to request care online for non-urgent symptoms. For details visit [email or web address].   Also download the MyChart app! Go to the app store, search "MyChart", open the app, select [place], and log in with your MyChart username and password.

## 2023-10-09 ENCOUNTER — Other Ambulatory Visit: Payer: Self-pay

## 2023-10-09 ENCOUNTER — Inpatient Hospital Stay: Payer: Medicaid Other

## 2023-10-09 VITALS — BP 126/82

## 2023-10-09 DIAGNOSIS — C779 Secondary and unspecified malignant neoplasm of lymph node, unspecified: Secondary | ICD-10-CM | POA: Diagnosis not present

## 2023-10-09 DIAGNOSIS — C7951 Secondary malignant neoplasm of bone: Secondary | ICD-10-CM | POA: Diagnosis not present

## 2023-10-09 DIAGNOSIS — C61 Malignant neoplasm of prostate: Secondary | ICD-10-CM | POA: Diagnosis not present

## 2023-10-09 DIAGNOSIS — C778 Secondary and unspecified malignant neoplasm of lymph nodes of multiple regions: Secondary | ICD-10-CM

## 2023-10-09 DIAGNOSIS — Z5111 Encounter for antineoplastic chemotherapy: Secondary | ICD-10-CM | POA: Diagnosis not present

## 2023-10-09 DIAGNOSIS — Z5189 Encounter for other specified aftercare: Secondary | ICD-10-CM | POA: Diagnosis not present

## 2023-10-09 MED ORDER — PEGFILGRASTIM-CBQV 6 MG/0.6ML ~~LOC~~ SOSY
6.0000 mg | PREFILLED_SYRINGE | Freq: Once | SUBCUTANEOUS | Status: AC
  Administered 2023-10-09: 6 mg via SUBCUTANEOUS
  Filled 2023-10-09: qty 0.6

## 2023-10-09 NOTE — Patient Instructions (Signed)

## 2023-10-13 ENCOUNTER — Other Ambulatory Visit: Payer: Self-pay | Admitting: Nurse Practitioner

## 2023-10-13 ENCOUNTER — Other Ambulatory Visit: Payer: Self-pay

## 2023-10-13 DIAGNOSIS — N400 Enlarged prostate without lower urinary tract symptoms: Secondary | ICD-10-CM

## 2023-10-13 DIAGNOSIS — R339 Retention of urine, unspecified: Secondary | ICD-10-CM

## 2023-10-13 DIAGNOSIS — R972 Elevated prostate specific antigen [PSA]: Secondary | ICD-10-CM

## 2023-10-13 MED ORDER — TAMSULOSIN HCL 0.4 MG PO CAPS
0.4000 mg | ORAL_CAPSULE | Freq: Every day | ORAL | 3 refills | Status: DC
  Filled 2023-10-13: qty 30, 30d supply, fill #0
  Filled 2023-11-15: qty 30, 30d supply, fill #1
  Filled 2023-12-20: qty 30, 30d supply, fill #2
  Filled 2024-01-19: qty 30, 30d supply, fill #3

## 2023-10-14 ENCOUNTER — Other Ambulatory Visit: Payer: Self-pay

## 2023-10-16 ENCOUNTER — Telehealth: Payer: Self-pay | Admitting: Hematology

## 2023-10-16 NOTE — Telephone Encounter (Signed)
Patient is aware of reschedled appointment times/dates

## 2023-10-28 ENCOUNTER — Inpatient Hospital Stay: Payer: Medicaid Other | Attending: Nurse Practitioner | Admitting: Nurse Practitioner

## 2023-10-28 ENCOUNTER — Inpatient Hospital Stay: Payer: Medicaid Other | Attending: Nurse Practitioner

## 2023-10-28 ENCOUNTER — Inpatient Hospital Stay: Payer: Medicaid Other

## 2023-10-28 ENCOUNTER — Encounter: Payer: Self-pay | Admitting: Nurse Practitioner

## 2023-10-28 ENCOUNTER — Other Ambulatory Visit: Payer: Self-pay

## 2023-10-28 VITALS — HR 96

## 2023-10-28 VITALS — BP 132/86 | HR 106 | Temp 98.9°F | Resp 18 | Ht 70.0 in | Wt 251.3 lb

## 2023-10-28 DIAGNOSIS — Z5189 Encounter for other specified aftercare: Secondary | ICD-10-CM | POA: Diagnosis not present

## 2023-10-28 DIAGNOSIS — C61 Malignant neoplasm of prostate: Secondary | ICD-10-CM

## 2023-10-28 DIAGNOSIS — C779 Secondary and unspecified malignant neoplasm of lymph node, unspecified: Secondary | ICD-10-CM | POA: Insufficient documentation

## 2023-10-28 DIAGNOSIS — Z5111 Encounter for antineoplastic chemotherapy: Secondary | ICD-10-CM | POA: Insufficient documentation

## 2023-10-28 DIAGNOSIS — C778 Secondary and unspecified malignant neoplasm of lymph nodes of multiple regions: Secondary | ICD-10-CM

## 2023-10-28 DIAGNOSIS — C7951 Secondary malignant neoplasm of bone: Secondary | ICD-10-CM | POA: Diagnosis not present

## 2023-10-28 DIAGNOSIS — Z95828 Presence of other vascular implants and grafts: Secondary | ICD-10-CM

## 2023-10-28 LAB — CBC WITH DIFFERENTIAL (CANCER CENTER ONLY)
Abs Immature Granulocytes: 0.03 10*3/uL (ref 0.00–0.07)
Basophils Absolute: 0 10*3/uL (ref 0.0–0.1)
Basophils Relative: 1 %
Eosinophils Absolute: 0 10*3/uL (ref 0.0–0.5)
Eosinophils Relative: 0 %
HCT: 30.2 % — ABNORMAL LOW (ref 39.0–52.0)
Hemoglobin: 9.7 g/dL — ABNORMAL LOW (ref 13.0–17.0)
Immature Granulocytes: 1 %
Lymphocytes Relative: 12 %
Lymphs Abs: 0.8 10*3/uL (ref 0.7–4.0)
MCH: 29.7 pg (ref 26.0–34.0)
MCHC: 32.1 g/dL (ref 30.0–36.0)
MCV: 92.4 fL (ref 80.0–100.0)
Monocytes Absolute: 0.3 10*3/uL (ref 0.1–1.0)
Monocytes Relative: 4 %
Neutro Abs: 5.4 10*3/uL (ref 1.7–7.7)
Neutrophils Relative %: 82 %
Platelet Count: 232 10*3/uL (ref 150–400)
RBC: 3.27 MIL/uL — ABNORMAL LOW (ref 4.22–5.81)
RDW: 18.4 % — ABNORMAL HIGH (ref 11.5–15.5)
WBC Count: 6.5 10*3/uL (ref 4.0–10.5)
nRBC: 0 % (ref 0.0–0.2)

## 2023-10-28 LAB — CMP (CANCER CENTER ONLY)
ALT: 7 U/L (ref 0–44)
AST: 10 U/L — ABNORMAL LOW (ref 15–41)
Albumin: 3.7 g/dL (ref 3.5–5.0)
Alkaline Phosphatase: 190 U/L — ABNORMAL HIGH (ref 38–126)
Anion gap: 8 (ref 5–15)
BUN: 22 mg/dL — ABNORMAL HIGH (ref 6–20)
CO2: 24 mmol/L (ref 22–32)
Calcium: 9.1 mg/dL (ref 8.9–10.3)
Chloride: 107 mmol/L (ref 98–111)
Creatinine: 1.28 mg/dL — ABNORMAL HIGH (ref 0.61–1.24)
GFR, Estimated: >60 mL/min (ref >60)
Glucose, Bld: 140 mg/dL — ABNORMAL HIGH (ref 70–99)
Potassium: 3.8 mmol/L (ref 3.5–5.1)
Sodium: 139 mmol/L (ref 135–145)
Total Bilirubin: 0.3 mg/dL (ref <1.2)
Total Protein: 6.9 g/dL (ref 6.5–8.1)

## 2023-10-28 MED ORDER — DOCETAXEL CHEMO INJECTION 160 MG/16ML
60.0000 mg/m2 | Freq: Once | INTRAVENOUS | Status: AC
  Administered 2023-10-28: 141 mg via INTRAVENOUS
  Filled 2023-10-28: qty 14.1

## 2023-10-28 MED ORDER — HEPARIN SOD (PORK) LOCK FLUSH 100 UNIT/ML IV SOLN
500.0000 [IU] | Freq: Once | INTRAVENOUS | Status: AC | PRN
Start: 2023-10-28 — End: 2023-10-28
  Administered 2023-10-28: 500 [IU]

## 2023-10-28 MED ORDER — SODIUM CHLORIDE 0.9% FLUSH
10.0000 mL | Freq: Once | INTRAVENOUS | Status: AC
  Administered 2023-10-28: 10 mL

## 2023-10-28 MED ORDER — SODIUM CHLORIDE 0.9 % IV SOLN: Freq: Once | INTRAVENOUS | Status: AC

## 2023-10-28 MED ORDER — DEXAMETHASONE SODIUM PHOSPHATE 10 MG/ML IJ SOLN
10.0000 mg | Freq: Once | INTRAMUSCULAR | Status: AC
  Administered 2023-10-28: 10 mg via INTRAVENOUS
  Filled 2023-10-28: qty 1

## 2023-10-28 MED ORDER — GABAPENTIN 100 MG PO CAPS
100.0000 mg | ORAL_CAPSULE | Freq: Two times a day (BID) | ORAL | 0 refills | Status: DC | PRN
  Filled 2023-10-28 – 2023-10-30 (×2): qty 120, 30d supply, fill #0

## 2023-10-28 MED ORDER — SODIUM CHLORIDE 0.9% FLUSH
10.0000 mL | INTRAVENOUS | Status: DC | PRN
  Administered 2023-10-28: 10 mL

## 2023-10-28 NOTE — Assessment & Plan Note (Addendum)
-  Stage IVb with node metastasis, metastasis to bone, castration resistant. -Diagnosed in 2020, initially presented with Gleason score 9 and PSA 42 -he started ADT in 2020, initially responded with PSA dropped to 2.77 in November 2021.  PSA started rising and went to 9.3 in November 2022.  Patient was not very compliant with his treatment. -He received palliative radiation to the left chest wall and left supraclavicular nodes in 2023. -He started Xtandi 160 mg daily in July 2023. He responded well and PSA has been less than 1 since then. -During his recent visit with [facility] Dr.Dr. Mena Goes on 06/17/2023, he was found to have elevated PSA 5.73, and he has developed low back pain and bilateral hip pain, concerning for disease progression. -He underwent restaging PSMA PET scan on July 16, 2023, which was compared with previous PET scan from 2023. PET scan from 2023 showed mixed response, with worsening bone metastasis in pelvis and bilateral hip, but improvement in the adenopathy and bone mets in lower neck and upper chest, probably partially related to previous radiation. -His previous genetic testing was negative. Next generation sequencing on his initial prostate biopsy sample has been requested. -his PET scan from July 21, 2023 showed mixed response, but overall worsening bone metastasis.  It was recommended to change treatment to chemotherapy docetaxel every 3 weeks, for up to 10 cycles. -Goal of chemotherapy is palliative, for disease control and prolong his life. 08/05/2023 he received initial dose docetaxel and is taking prednisone daily. 10/07/2023 - reduced dose docetaxel for Cycle 4 due to development of neuropathy. Gabapentin was added at night 9100-200mg ) as needed and as tolerated.  10/28/2023 - presents for Cycle 5 day 1 docetaxel. Clinically stable and doing well. Slight increase to dose gabapentin. Proceed with lower dose docetaxel as anticipated.

## 2023-10-28 NOTE — Progress Notes (Unsigned)
Patient Care Team: Craig Andrew, NP as PCP - General (Pulmonary Disease) Craig Cushing, RN as Oncology Nurse Navigator Craig Mood, MD as Consulting Physician (Hematology and Oncology)  Clinic Day:  10/28/2023  Referring physician: Malachy Mood, MD  ASSESSMENT & PLAN:   Assessment & Plan: Prostate cancer [name]) -Stage IVb with node metastasis, metastasis to bone, castration resistant. -Diagnosed in 2020, initially presented with Gleason score 9 and PSA 42 -he started ADT in 2020, initially responded with PSA dropped to 2.77 in November 2021.  PSA started rising and went to 9.3 in November 2022.  Patient was not very compliant with his treatment. -He received palliative radiation to the left chest wall and left supraclavicular nodes in 2023. -He started Xtandi 160 mg daily in July 2023. He responded well and PSA has been less than 1 since then. -During his recent visit with [facility] Dr.Dr. Mena Collins on 06/17/2023, he was found to have elevated PSA 5.73, and he has developed low back pain and bilateral hip pain, concerning for disease progression. -He underwent restaging PSMA PET scan on July 16, 2023, which was compared with previous PET scan from 2023. PET scan from 2023 showed mixed response, with worsening bone metastasis in pelvis and bilateral hip, but improvement in the adenopathy and bone mets in lower neck and upper chest, probably partially related to previous radiation. -His previous genetic testing was negative. Next generation sequencing on his initial prostate biopsy sample has been requested. -his PET scan from July 21, 2023 showed mixed response, but overall worsening bone metastasis.  It was recommended to change treatment to chemotherapy docetaxel every 3 weeks, for up to 10 cycles. -Goal of chemotherapy is palliative, for disease control and prolong his life. 08/05/2023 he received initial dose docetaxel and is taking prednisone daily. 10/07/2023 - reduced dose docetaxel for Cycle 4 due  to development of neuropathy. Gabapentin was added at night 9100-200mg ) as needed and as tolerated.  10/28/2023 - presents for Cycle 5 day 1 docetaxel   Plan: Labs reviewed  -CBC showing WBC 6.5; Hgb 9.7; Hct 30.2; Plt 232; Anc 5.4 -CMP - K 3.8; glucose 140; BUN 22; Creatinine 1.28; eGFR >60; Ca 9.1; AST 10; ALT 7; AlkPhos 190.   -PSA pending Neuropathy reported as slightly improved with lower dose docetaxel. Labs adequate to proceed with cycle 5 day 1 of reduced dose docetaxel.  Increase dose gabapentin to 1 to 2 capsules twice daily as needed.  May take tylenol and previously prescribed muscle relaxer as needed for sciatica and muscle pain. He does have follow up with Craig Bandy, NP in palliative care on 11/18/2023 for further evaluation.  Labs/flush, follow up, and cycle 6 day 1 as scheduled.   The patient understands the plans discussed today and is in agreement with them.  He knows to contact our office if he develops concerns prior to his next appointment.  I provided 25 minutes of face-to-face time during this encounter and > 50% was spent counseling as documented under my assessment and plan.    Craig Jews, NP  [name]   No orders of the defined types were placed in this encounter.     CHIEF COMPLAINT:  CC: prostate cancer Stagge Ivb, castration resistant    Current Treatment:  docetaxel q 21 days  INTERVAL HISTORY:  Craig Collins is here today for  repeat clinical assessment. He was last seen by myself on 10/07/2023. He had started to develop neuropathy. Dose of docetaxel reduced for Cycle 4. Added gabapentin 100 - 200 mg every evening. States that this medication does seem to be helping. Quiets the tingling in the fingers. Has been taking one during the day and one at night. Started having left sciatica on  Saturday. Feels as though this started after lifting something heavy. Reviewed PET scan from 07/22/2023 per Dr. Mena Collins which does show skeletal uptake in L5 vertebral body. Patient denies pain in the back. States that taking tylenol and previously prescribed muscle relaxer, and gabapentin do help the pain. He continues to  see Dr. Mena Collins, urology for cystoscopy.  He states that last PSA with Dr. Mena Collins was 6.0. PSA from today's visit is pending. He denies fevers or chills. His appetite is good. His weight has increased 2 pounds over last 3 weeks .  I have reviewed the past medical history, past surgical history, social history and family history with the patient and they are unchanged from previous note.  ALLERGIES:  has No Known Allergies.  MEDICATIONS:  Current Outpatient Medications  Medication Sig Dispense Refill   albuterol (VENTOLIN HFA) 108 (90 Base) MCG/ACT inhaler Inhale 2 puffs into the lungs every 6 (six) hours as needed for wheezing or shortness of breath. 18 g 6   ascorbic acid (VITAMIN C) 500 MG tablet Take 500 mg by mouth daily.     Calcium Carbonate-Vitamin D (CALCIUM 500/D) 500-125 MG-UNIT TABS Take 1 tablet by mouth daily.     cyanocobalamin 1000 MCG tablet Take 2 tablets (2,000 mcg total) by mouth daily. 30 tablet 0   cyclobenzaprine (FLEXERIL) 10 MG tablet Take 1 tablet (10 mg total) by mouth 3 (three) times daily as needed for muscle spasms. 30 tablet 1   fluticasone (FLONASE) 50 MCG/ACT nasal spray Place 2 sprays into both nostrils daily. 16 g 6   gabapentin (NEURONTIN) 100 MG capsule Take 1-2 capsules (100-200 mg total) by mouth at bedtime. 60 capsule 0   hydrochlorothiazide (HYDRODIURIL) 25 MG tablet TAKE 1/2 TABLET (12.5 MG TOTAL) BY MOUTH DAILY. 45 tablet 3   lidocaine-prilocaine (EMLA) cream Apply to affected area once 30 g 3   lisinopril (ZESTRIL) 20 MG tablet Take 1 tablet (20 mg total) by mouth daily. 90 tablet 3   nystatin cream (MYCOSTATIN) Apply 1 Application  topically 2 (two) times daily. 30 g 1   ondansetron (ZOFRAN) 8 MG tablet Take 1 tablet (8 mg total) by mouth every 8 (eight) hours as needed for nausea or vomiting. 30 tablet 1   predniSONE (DELTASONE) 5 MG tablet Take 1 tablet (5 mg total) by mouth in the morning and at bedtime. 60 tablet 11   prochlorperazine (COMPAZINE) 10 MG tablet Take 1 tablet (10 mg total) by mouth every 6 (six) hours as needed for nausea or vomiting. 30 tablet 1   sodium chloride (OCEAN) 0.65 % SOLN nasal spray Place 1 spray into both nostrils as needed for congestion. 30 mL 0   tadalafil (CIALIS) 20 MG tablet Take 1 tablet (20 mg total) by mouth daily as needed. 10 tablet 11   tamsulosin (FLOMAX) 0.4 MG CAPS capsule Take 1 capsule (0.4 mg total) by mouth daily. 30 capsule 3   traMADol (ULTRAM) 50 MG tablet Take 1 - 2 tablets (50 - 100 mg total) by mouth every 6 hours as needed for moderate pain or severe pain. 90 tablet 0   VIAGRA  25 MG tablet Take 1 tablet (25 mg total) by mouth daily as needed for erectile dysfunction. 10 tablet 0   aspirin EC 81 MG tablet Take 81 mg by mouth daily. (Patient not taking: Reported on 10/28/2023)     No current facility-administered medications for this visit.    HISTORY OF PRESENT ILLNESS:   Oncology History Overview Note   Cancer Staging  Prostate cancer [facility]) Staging form: Prostate, AJCC 8th Edition - Clinical: Stage IVB (cTX, cNX, pM1b, PSA: 42, Grade Group: 5) - Signed by Benjiman Core, MD on 07/08/2022 Prostate specific antigen (PSA) range: 20 or greater Histologic grading system: 5 grade system     Prostate cancer (HCC)  12/21/2019 Initial Diagnosis   Prostate cancer (HCC)   07/08/2022 Cancer Staging   Staging form: Prostate, AJCC 8th Edition - Clinical: Stage IVB (cTX, cNX, pM1b, PSA: 42, Grade Group: 5) - Signed by Benjiman Core, MD on 07/08/2022 Prostate specific antigen (PSA) range: 20 or greater Histologic grading system: 5 grade system   Metastasis to bone  (HCC)  07/08/2022 Initial Diagnosis   Metastasis to bone (HCC)   07/16/2023 PET scan    IMPRESSION: 1. Mixed response to therapy. 2. Interval decrease in size of the prostate gland with mild residual tracer uptake within the right posterior gland. 3. Interval decrease in size of thoracic and abdominal adenopathy. 4. Persistent, multifocal tracer avid bone metastases. Overall, the tracer avid lesions appear increased in size and degree of tracer uptake. Lesion within the right calvarium has resolved in the interval.    Miscellaneous   Foundation[facility]      Malignant neoplasm of prostate metastatic to lymph nodes of multiple sites (HCC)  07/08/2022 Initial Diagnosis   Malignant neoplasm of prostate metastatic to lymph nodes of multiple sites (HCC)   08/05/2023 -  Chemotherapy   Patient is on Treatment Plan : PROSTATE Docetaxel (75) + Prednisone q21d         REVIEW OF SYSTEMS:   Constitutional: Denies fevers, chills or abnormal weight loss Eyes: Denies blurriness of vision Ears, nose, mouth, throat, and face: Denies mucositis or sore throat Respiratory: Denies cough, dyspnea or wheezes Cardiovascular: Denies palpitation, chest discomfort or lower extremity swelling Gastrointestinal:  Denies nausea, heartburn or change in bowel habits Skin: Denies abnormal skin rashes Lymphatics: Denies new lymphadenopathy or easy bruising Neurological:continues to have tingling in the fingertips, especially the thumbs, index, and middle fingers. Slightly improved since last visit. Having left sided sciatic nerve type pain which is intermittent.  Behavioral/Psych: Collins is stable, no new changes  All other systems were reviewed with the patient and are negative.   VITALS:   Today's Vitals   10/28/23 0835 10/28/23 0846  BP: 132/86   Pulse: (!) 106   Resp: 18   Temp: (!) 100.5 F (38.1 C)   TempSrc: Tympanic   SpO2: 100%   Weight: 251 lb 4.8 oz (114 kg)   Height: 5\' 10"  (1.778 m)    PainSc:  2    Body mass index is 36.06 kg/m.   Wt Readings from Last 3 Encounters:  10/28/23 251 lb 4.8 oz (114 kg)  10/07/23 249 lb 14.4 oz (113.4 kg)  09/16/23 242 lb 11.2 oz (110.1 kg)    Body mass index is 36.06 kg/m.  Performance status (ECOG): 1 - Symptomatic but completely ambulatory  PHYSICAL EXAM:   GENERAL:alert, no distress and comfortable SKIN: skin color, texture, turgor are normal, no rashes or significant lesions EYES: normal, Conjunctiva  are pink and non-injected, sclera clear OROPHARYNX:no exudate, no erythema and lips, buccal mucosa, and tongue normal  NECK: supple, thyroid normal size, non-tender, without nodularity LYMPH:  no palpable lymphadenopathy in the cervical, axillary or inguinal LUNGS: clear to auscultation and percussion with normal breathing effort HEART: regular rate & rhythm and no murmurs and no lower extremity edema ABDOMEN:abdomen soft, non-tender and normal bowel sounds Musculoskeletal:no cyanosis of digits and no clubbing. No palpable abnormality or bony deformity noted of the lumbar spine or left hip.  NEURO: alert & oriented x 3 with fluent speech, no focal motor/sensory deficits  LABORATORY DATA:  I have reviewed the data as listed    Component Value Date/Time   NA 139 10/28/2023 0759   NA 142 10/22/2022 1548   K 3.8 10/28/2023 0759   CL 107 10/28/2023 0759   CO2 24 10/28/2023 0759   GLUCOSE 140 (H) 10/28/2023 0759   BUN 22 (H) 10/28/2023 0759   BUN 19 10/22/2022 1548   CREATININE 1.28 (H) 10/28/2023 0759   CREATININE 1.39 (H) 10/28/2017 0958   CALCIUM 9.1 10/28/2023 0759   PROT 6.9 10/28/2023 0759   PROT 6.6 10/22/2022 1548   ALBUMIN 3.7 10/28/2023 0759   ALBUMIN 3.9 10/22/2022 1548   AST 10 (L) 10/28/2023 0759   ALT 7 10/28/2023 0759   ALKPHOS 190 (H) 10/28/2023 0759   BILITOT 0.3 10/28/2023 0759   GFRNONAA >60 10/28/2023 0759   GFRAA 65 06/18/2020 0921    Lab Results  Component Value Date   WBC 6.5 10/28/2023    NEUTROABS 5.4 10/28/2023   HGB 9.7 (L) 10/28/2023   HCT 30.2 (L) 10/28/2023   MCV 92.4 10/28/2023   PLT 232 10/28/2023   Addendum I have seen the patient, examined him. I agree with the assessment and and plan and have edited the notes.   Mr. Craig Collins is clinically stable, neuropathy is controlled and overall stable.  Lab reviewed, adequate for treatment, will proceed chemo at the same reduced dose.  His PSA 6 weeks ago dropped from previously, indicating some response to chemotherapy.  All question were answered, I spent a total of 25 minutes for his visit today, more than 50% time on face-to-face counseling.  Craig Mood MD 10/28/2023

## 2023-10-28 NOTE — Patient Instructions (Signed)
[facility] - A DEPT OF [facility]  Discharge Instructions: Thank you for choosing [facility] to provide your oncology and hematology care.   If you have a lab appointment with the Cancer Center, please go directly to the Cancer Center and check in at the registration area.   Wear comfortable clothing and clothing appropriate for easy access to any Portacath or PICC line.   We strive to give you quality time with your provider. You may need to reschedule your appointment if you arrive late (15 or more minutes).  Arriving late affects you and other patients whose appointments are after yours.  Also, if you miss three or more appointments without notifying the office, you may be dismissed from the clinic at the provider's discretion.      For prescription refill requests, have your pharmacy contact our office and allow 72 hours for refills to be completed.    Today you received the following chemotherapy and/or immunotherapy agents: Docetaxel      To help prevent nausea and vomiting after your treatment, we encourage you to take your nausea medication as directed.  BELOW ARE SYMPTOMS THAT SHOULD BE REPORTED IMMEDIATELY: *FEVER GREATER THAN 100.4 F (38 C) OR HIGHER *CHILLS OR SWEATING *NAUSEA AND VOMITING THAT IS NOT CONTROLLED WITH YOUR NAUSEA MEDICATION *UNUSUAL SHORTNESS OF BREATH *UNUSUAL BRUISING OR BLEEDING *URINARY PROBLEMS (pain or burning when urinating, or frequent urination) *BOWEL PROBLEMS (unusual diarrhea, constipation, pain near the anus) TENDERNESS IN MOUTH AND THROAT WITH OR WITHOUT PRESENCE OF ULCERS (sore throat, sores in mouth, or a toothache) UNUSUAL RASH, SWELLING OR PAIN  UNUSUAL VAGINAL DISCHARGE OR ITCHING   Items with * indicate a potential emergency and should be followed up as soon as possible or go to the Emergency Department if any problems should occur.  Please show the CHEMOTHERAPY ALERT CARD or IMMUNOTHERAPY  ALERT CARD at check-in to the Emergency Department and triage nurse.  Should you have questions after your visit or need to cancel or reschedule your appointment, please contact [facility] - A DEPT OF [facility]  Dept: [phone]  and follow the prompts.  Office hours are 8:00 a.m. to 4:30 p.m. Monday - Friday. Please note that voicemails left after 4:00 p.m. may not be returned until the following business day.  We are closed weekends and major holidays. You have access to a nurse at all times for urgent questions. Please call the main number to the clinic Dept: [phone] and follow the prompts.   For any non-urgent questions, you may also contact your provider using MyChart. We now offer e-Visits for anyone 64 and older to request care online for non-urgent symptoms. For details visit [email or web address].   Also download the MyChart app! Go to the app store, search "MyChart", open the app, select , and log in with your MyChart username and password.

## 2023-10-29 ENCOUNTER — Encounter: Payer: Self-pay | Admitting: Hematology

## 2023-10-29 LAB — PROSTATE-SPECIFIC AG, SERUM (LABCORP): Prostate Specific Ag, Serum: 5.5 ng/mL — ABNORMAL HIGH (ref 0.0–4.0)

## 2023-10-30 ENCOUNTER — Encounter: Payer: Self-pay | Admitting: Hematology

## 2023-10-30 ENCOUNTER — Other Ambulatory Visit: Payer: Self-pay

## 2023-10-30 ENCOUNTER — Inpatient Hospital Stay: Payer: Medicaid Other

## 2023-10-30 VITALS — BP 129/80 | HR 109 | Temp 98.8°F | Resp 18

## 2023-10-30 DIAGNOSIS — C7951 Secondary malignant neoplasm of bone: Secondary | ICD-10-CM | POA: Diagnosis not present

## 2023-10-30 DIAGNOSIS — Z5189 Encounter for other specified aftercare: Secondary | ICD-10-CM | POA: Diagnosis not present

## 2023-10-30 DIAGNOSIS — C778 Secondary and unspecified malignant neoplasm of lymph nodes of multiple regions: Secondary | ICD-10-CM

## 2023-10-30 DIAGNOSIS — C779 Secondary and unspecified malignant neoplasm of lymph node, unspecified: Secondary | ICD-10-CM | POA: Diagnosis not present

## 2023-10-30 DIAGNOSIS — C61 Malignant neoplasm of prostate: Secondary | ICD-10-CM | POA: Diagnosis not present

## 2023-10-30 DIAGNOSIS — Z5111 Encounter for antineoplastic chemotherapy: Secondary | ICD-10-CM | POA: Diagnosis not present

## 2023-10-30 MED ORDER — PEGFILGRASTIM-FPGK 6 MG/0.6ML ~~LOC~~ SOSY
6.0000 mg | PREFILLED_SYRINGE | Freq: Once | SUBCUTANEOUS | Status: AC
  Administered 2023-10-30: 6 mg via SUBCUTANEOUS

## 2023-11-02 ENCOUNTER — Other Ambulatory Visit: Payer: Self-pay

## 2023-11-03 ENCOUNTER — Other Ambulatory Visit: Payer: Self-pay | Admitting: Nurse Practitioner

## 2023-11-03 ENCOUNTER — Encounter: Payer: Self-pay | Admitting: Hematology

## 2023-11-03 ENCOUNTER — Other Ambulatory Visit: Payer: Self-pay

## 2023-11-03 DIAGNOSIS — C61 Malignant neoplasm of prostate: Secondary | ICD-10-CM

## 2023-11-03 DIAGNOSIS — G893 Neoplasm related pain (acute) (chronic): Secondary | ICD-10-CM

## 2023-11-03 DIAGNOSIS — Z515 Encounter for palliative care: Secondary | ICD-10-CM

## 2023-11-03 MED ORDER — TRAMADOL HCL 50 MG PO TABS
50.0000 mg | ORAL_TABLET | Freq: Four times a day (QID) | ORAL | 0 refills | Status: DC | PRN
  Filled 2023-11-03: qty 90, 12d supply, fill #0

## 2023-11-12 ENCOUNTER — Other Ambulatory Visit: Payer: Self-pay

## 2023-11-13 ENCOUNTER — Other Ambulatory Visit: Payer: Self-pay

## 2023-11-16 NOTE — Progress Notes (Unsigned)
Palliative Medicine [facility]  Telephone:([phone]) [phone] [phone]) [phone]   Name: Craig Collins Date: 11/16/2023 MRN: [id number]  DOB: 1966-12-14  Patient Care Team: Ivonne Andrew, NP as PCP - General (Pulmonary Disease) Cherlyn Cushing, RN as Oncology Nurse Navigator Malachy Mood, MD as Consulting Physician (Hematology and Oncology)   INTERVAL HISTORY: Craig Collins is a 57 y.o. 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 Palliative ask to see for symptom and pain management and goals of care.   SOCIAL HISTORY:     reports that he has never smoked. He has never been exposed to tobacco smoke. He has never used smokeless tobacco. He reports that he does not currently use alcohol. He reports that he does not use drugs.  ADVANCE DIRECTIVES:  None on file  CODE STATUS: Full code  PAST MEDICAL HISTORY: Past Medical History:  Diagnosis Date   Benign prostate hyperplasia    Elevated PSA    Erectile dysfunction    Hypertension    Obesity (BMI 35.0-39.9 without comorbidity)    Prostate cancer (HCC) 05/2019   Sleep apnea    Urinary retention     ALLERGIES:  has No Known Allergies.  MEDICATIONS:  Current Outpatient Medications  Medication Sig Dispense Refill   albuterol (VENTOLIN HFA) 108 (90 Base) MCG/ACT inhaler Inhale 2 puffs into the lungs every 6 (six) hours as needed for wheezing or shortness of breath. 18 g 6   ascorbic acid (VITAMIN C) 500 MG tablet Take 500 mg by mouth daily.     aspirin EC 81 MG tablet Take 81 mg by mouth daily. (Patient not taking: Reported on 10/28/2023)     Calcium Carbonate-Vitamin D (CALCIUM 500/D) 500-125 MG-UNIT TABS Take 1 tablet by mouth daily.     cyanocobalamin 1000 MCG tablet Take 2 tablets (2,000 mcg total) by mouth daily. 30 tablet 0   cyclobenzaprine (FLEXERIL)  10 MG tablet Take 1 tablet (10 mg total) by mouth 3 (three) times daily as needed for muscle spasms. 30 tablet 1   fluticasone (FLONASE) 50 MCG/ACT nasal spray Place 2 sprays into both nostrils daily. 16 g 6   gabapentin (NEURONTIN) 100 MG capsule Take 1-2 capsules (100-200 mg total) by mouth 2 (two) times daily as needed for neuropathy 120 capsule 0   hydrochlorothiazide (HYDRODIURIL) 25 MG tablet TAKE 1/2 TABLET (12.5 MG TOTAL) BY MOUTH DAILY. 45 tablet 3   lidocaine-prilocaine (EMLA) cream Apply to affected area once 30 g 3   lisinopril (ZESTRIL) 20 MG tablet Take 1 tablet (20 mg total) by mouth daily. 90 tablet 3   nystatin cream (MYCOSTATIN) Apply 1 Application topically 2 (two) times daily. 30 g 1   ondansetron (ZOFRAN) 8 MG tablet Take 1 tablet (8 mg total) by mouth every 8 (eight) hours as needed for nausea or vomiting. 30 tablet 1   predniSONE (DELTASONE) 5 MG tablet Take 1 tablet (5 mg total) by mouth in the morning and at bedtime. 60 tablet 11   prochlorperazine (COMPAZINE) 10 MG tablet Take 1 tablet (10 mg total) by mouth every 6 (six) hours as needed for nausea or vomiting. 30 tablet 1   sodium chloride (OCEAN) 0.65 % SOLN nasal spray Place 1 spray into both nostrils as needed for congestion. 30 mL 0   tadalafil (CIALIS) 20 MG tablet Take 1 tablet (20 mg total) by mouth daily as  needed. 10 tablet 11   tamsulosin (FLOMAX) 0.4 MG CAPS capsule Take 1 capsule (0.4 mg total) by mouth daily. 30 capsule 3   traMADol (ULTRAM) 50 MG tablet Take 1 - 2 tablets (50 - 100 mg total) by mouth every 6 hours as needed for moderate pain or severe pain. 90 tablet 0   VIAGRA 25 MG tablet Take 1 tablet (25 mg total) by mouth daily as needed for erectile dysfunction. 10 tablet 0   No current facility-administered medications for this visit.    VITAL SIGNS: There were no vitals taken for this visit. There were no vitals filed for this visit.  Estimated body mass index is 36.06 kg/m as calculated from  the following:   Height as of 10/28/23: 5\' 10"  (1.778 m).   Weight as of 10/28/23: 251 lb 4.8 oz (114 kg).   PERFORMANCE STATUS (ECOG) : 1 - Symptomatic but completely ambulatory  Assessment NAD RRR AAO x4  IMPRESSION: I saw Mr. [name] during his infusion.  No acute distress.  Wife is present.  Patient doing well overall. Denies nausea, vomiting, constipation, or diarrhea.  Appetite is slowly improving.  Some days are better than others.  Is taking things one day at a time. He is looking forward to upcoming Thanksgiving holidays and preparing dinner with his wife.   Pain [name] reports his pain has recently increased over the past several weeks. We discussed changes in weather. Some days are better than others. He does not require medication around the clock however is having to take Tramadol as needed 50-100mg . Reports this is controlling his pain without difficulty.   Tolerating gabapentin for his neuropathic symptoms. We will continue to closely monitor and support in collaboration with oncology.  2. Constipation Controlled with daily regimen  3. Goals of Care  04/16/23-We discussed [name] current illness and what it means in the larger context of his on-going co-morbidities. Natural disease trajectory and expectations were discussed. We discussed the importance of continued conversation with family and their medical providers regarding overall plan of care and treatment options, ensuring decisions are within the context of the patients values and GOCs.   [name] relates that it has become more difficult for him to leave home, mostly due to the pain and fatigue. There are other factors as well, such as the sweating that he experiences without warning. Patient reports this makes him feel "blue." He is hopeful that an improvement in his pain will translate into an improvement in his energy levels.    [name] relies on his faith as a source of strength in coping  with his illness. He states that he does not want to claim having cancer and intentionally turns off the television when there is content on cancer or death. He is trying to enjoy each day of his life and not look at cancer as a death sentence.   I discussed the importance of continued conversation with family and their medical providers regarding overall plan of care and treatment options, ensuring decisions are within the context of the patients values and GOCs.  PLAN:  Gabapentin per Herbert Seta, NP.  Tramadol 50-100mg  every 6 hours as needed.  Encourage pacing of activity and taking rest breaks. Senna daily for bowel regimen Miralax daily for bowel regimen Will continue to support as needed for symptom management. Palliative will plan to see patient back in 4-6  weeks in collaboration to other oncology appointments.   Patient expressed understanding and was  in agreement with this plan. He also understands that He can call the clinic at any time with any questions, concerns, or complaints.   Any controlled substances utilized were prescribed in the context of palliative care. PDMP has been reviewed.    Visit consisted of counseling and education dealing with the complex and emotionally intense issues of symptom management and palliative care in the setting of serious and potentially life-threatening illness.  Willette Alma, AGPCNP-BC  Palliative Medicine Team/[facility]  *Please note that this is a verbal dictation therefore any spelling or grammatical errors are due to the "Dragon Medical One" system interpretation.

## 2023-11-17 NOTE — Assessment & Plan Note (Signed)
-  Staging IV with node metastasis, castration resistant. -Diagnosed in 2020, initially presented with Gleason score 9 and PSA 42 -he started ADT in 2020, initially responded with PSA dropped to 2.77 in November 2021.  PSA started rising and went to 9.3 in November 2022.  Patient was not very compliant with his treatment. -He received palliative radiation to the left chest wall and left supraclavicular nodes in 2023. -He started Xtandi 160 mg daily in July 2023. He responded well and PSA has been less than 1 since then. -During his recent visit with [facility] Dr.Dr. Mena Goes on 06/17/2023, he was found to have elevated PSA 5.73, and he has developed low back pain and bilateral hip pain, concerning for disease progression. -He underwent restaging PSMA PET scan on July 16, 2023.  The scan has not been read by radiologist.  I personally reviewed the the scan images and compared to his previous PET scan from 2023, which showed mixed response, with worsening bone metastasis in pelvis and bilateral hip, but the improvement in the adenopathy and bone mets in lower neck and upper chest, probably partially related to previous radiation. -his PET scan from July 21, 2023 showed mixed response, but overall worsening bone metastasis.  I recommended changing treatment to chemotherapy docetaxel every 3 weeks, for up to 10 cycles. -Goal of chemotherapy is palliative, for disease control and prolong his life. -He is tolerating chemotherapy moderately well with some neuropathy.

## 2023-11-18 ENCOUNTER — Encounter: Payer: Self-pay | Admitting: Hematology

## 2023-11-18 ENCOUNTER — Inpatient Hospital Stay: Payer: Medicaid Other

## 2023-11-18 ENCOUNTER — Inpatient Hospital Stay (HOSPITAL_BASED_OUTPATIENT_CLINIC_OR_DEPARTMENT_OTHER): Payer: Medicaid Other | Admitting: Hematology

## 2023-11-18 ENCOUNTER — Encounter: Payer: Self-pay | Admitting: Nurse Practitioner

## 2023-11-18 ENCOUNTER — Inpatient Hospital Stay (HOSPITAL_BASED_OUTPATIENT_CLINIC_OR_DEPARTMENT_OTHER): Payer: Medicaid Other | Admitting: Nurse Practitioner

## 2023-11-18 ENCOUNTER — Other Ambulatory Visit: Payer: Self-pay

## 2023-11-18 VITALS — BP 128/80 | HR 103 | Temp 98.1°F | Resp 18 | Ht 70.0 in | Wt 252.1 lb

## 2023-11-18 DIAGNOSIS — C61 Malignant neoplasm of prostate: Secondary | ICD-10-CM

## 2023-11-18 DIAGNOSIS — M792 Neuralgia and neuritis, unspecified: Secondary | ICD-10-CM | POA: Diagnosis not present

## 2023-11-18 DIAGNOSIS — C778 Secondary and unspecified malignant neoplasm of lymph nodes of multiple regions: Secondary | ICD-10-CM

## 2023-11-18 DIAGNOSIS — C7951 Secondary malignant neoplasm of bone: Secondary | ICD-10-CM | POA: Diagnosis not present

## 2023-11-18 DIAGNOSIS — Z95828 Presence of other vascular implants and grafts: Secondary | ICD-10-CM

## 2023-11-18 DIAGNOSIS — Z515 Encounter for palliative care: Secondary | ICD-10-CM | POA: Diagnosis not present

## 2023-11-18 DIAGNOSIS — G893 Neoplasm related pain (acute) (chronic): Secondary | ICD-10-CM | POA: Diagnosis not present

## 2023-11-18 DIAGNOSIS — Z5189 Encounter for other specified aftercare: Secondary | ICD-10-CM | POA: Diagnosis not present

## 2023-11-18 DIAGNOSIS — C779 Secondary and unspecified malignant neoplasm of lymph node, unspecified: Secondary | ICD-10-CM | POA: Diagnosis not present

## 2023-11-18 DIAGNOSIS — Z5111 Encounter for antineoplastic chemotherapy: Secondary | ICD-10-CM | POA: Diagnosis not present

## 2023-11-18 LAB — CMP (CANCER CENTER ONLY)
ALT: 8 U/L (ref 0–44)
AST: 10 U/L — ABNORMAL LOW (ref 15–41)
Albumin: 3.9 g/dL (ref 3.5–5.0)
Alkaline Phosphatase: 195 U/L — ABNORMAL HIGH (ref 38–126)
Anion gap: 6 (ref 5–15)
BUN: 27 mg/dL — ABNORMAL HIGH (ref 6–20)
CO2: 24 mmol/L (ref 22–32)
Calcium: 9.5 mg/dL (ref 8.9–10.3)
Chloride: 106 mmol/L (ref 98–111)
Creatinine: 1.2 mg/dL (ref 0.61–1.24)
GFR, Estimated: >60 mL/min (ref >60)
Glucose, Bld: 105 mg/dL — ABNORMAL HIGH (ref 70–99)
Potassium: 4.1 mmol/L (ref 3.5–5.1)
Sodium: 136 mmol/L (ref 135–145)
Total Bilirubin: 0.4 mg/dL (ref <1.2)
Total Protein: 7.4 g/dL (ref 6.5–8.1)

## 2023-11-18 LAB — CBC WITH DIFFERENTIAL (CANCER CENTER ONLY)
Abs Immature Granulocytes: 0.02 10*3/uL (ref 0.00–0.07)
Basophils Absolute: 0 10*3/uL (ref 0.0–0.1)
Basophils Relative: 0 %
Eosinophils Absolute: 0 10*3/uL (ref 0.0–0.5)
Eosinophils Relative: 0 %
HCT: 29.6 % — ABNORMAL LOW (ref 39.0–52.0)
Hemoglobin: 9.5 g/dL — ABNORMAL LOW (ref 13.0–17.0)
Immature Granulocytes: 0 %
Lymphocytes Relative: 10 %
Lymphs Abs: 0.7 10*3/uL (ref 0.7–4.0)
MCH: 30.4 pg (ref 26.0–34.0)
MCHC: 32.1 g/dL (ref 30.0–36.0)
MCV: 94.9 fL (ref 80.0–100.0)
Monocytes Absolute: 0.8 10*3/uL (ref 0.1–1.0)
Monocytes Relative: 11 %
Neutro Abs: 5.8 10*3/uL (ref 1.7–7.7)
Neutrophils Relative %: 79 %
Platelet Count: 262 10*3/uL (ref 150–400)
RBC: 3.12 MIL/uL — ABNORMAL LOW (ref 4.22–5.81)
RDW: 17.3 % — ABNORMAL HIGH (ref 11.5–15.5)
WBC Count: 7.4 10*3/uL (ref 4.0–10.5)
nRBC: 0 % (ref 0.0–0.2)

## 2023-11-18 MED ORDER — TRAMADOL HCL 50 MG PO TABS
50.0000 mg | ORAL_TABLET | Freq: Four times a day (QID) | ORAL | 0 refills | Status: AC | PRN
Start: 2023-11-18 — End: ?
  Filled 2023-11-18: qty 90, 12d supply, fill #0

## 2023-11-18 MED ORDER — SODIUM CHLORIDE 0.9% FLUSH
10.0000 mL | INTRAVENOUS | Status: DC | PRN
  Administered 2023-11-18: 10 mL

## 2023-11-18 MED ORDER — SODIUM CHLORIDE 0.9 % IV SOLN
60.0000 mg/m2 | Freq: Once | INTRAVENOUS | Status: AC
  Administered 2023-11-18: 141 mg via INTRAVENOUS
  Filled 2023-11-18: qty 14.1

## 2023-11-18 MED ORDER — SODIUM CHLORIDE 0.9% FLUSH
10.0000 mL | Freq: Once | INTRAVENOUS | Status: AC
Start: 2023-11-18 — End: 2023-11-18
  Administered 2023-11-18: 10 mL

## 2023-11-18 MED ORDER — SODIUM CHLORIDE 0.9 % IV SOLN
Freq: Once | INTRAVENOUS | Status: AC
Start: 2023-11-18 — End: 2023-11-18

## 2023-11-18 MED ORDER — HEPARIN SOD (PORK) LOCK FLUSH 100 UNIT/ML IV SOLN
500.0000 [IU] | Freq: Once | INTRAVENOUS | Status: AC | PRN
Start: 2023-11-18 — End: 2023-11-18
  Administered 2023-11-18: 500 [IU]

## 2023-11-18 MED ORDER — DEXAMETHASONE SODIUM PHOSPHATE 10 MG/ML IJ SOLN
10.0000 mg | Freq: Once | INTRAMUSCULAR | Status: AC
  Administered 2023-11-18: 10 mg via INTRAVENOUS
  Filled 2023-11-18: qty 1

## 2023-11-18 NOTE — Patient Instructions (Signed)
[facility] - A DEPT OF [facility]  Discharge Instructions: Thank you for choosing [facility] to provide your oncology and hematology care.   If you have a lab appointment with the Cancer Center, please go directly to the Cancer Center and check in at the registration area.   Wear comfortable clothing and clothing appropriate for easy access to any Portacath or PICC line.   We strive to give you quality time with your provider. You may need to reschedule your appointment if you arrive late (15 or more minutes).  Arriving late affects you and other patients whose appointments are after yours.  Also, if you miss three or more appointments without notifying the office, you may be dismissed from the clinic at the provider's discretion.      For prescription refill requests, have your pharmacy contact our office and allow 72 hours for refills to be completed.    Today you received the following chemotherapy and/or immunotherapy agents: Docetaxel      To help prevent nausea and vomiting after your treatment, we encourage you to take your nausea medication as directed.  BELOW ARE SYMPTOMS THAT SHOULD BE REPORTED IMMEDIATELY: *FEVER GREATER THAN 100.4 F (38 C) OR HIGHER *CHILLS OR SWEATING *NAUSEA AND VOMITING THAT IS NOT CONTROLLED WITH YOUR NAUSEA MEDICATION *UNUSUAL SHORTNESS OF BREATH *UNUSUAL BRUISING OR BLEEDING *URINARY PROBLEMS (pain or burning when urinating, or frequent urination) *BOWEL PROBLEMS (unusual diarrhea, constipation, pain near the anus) TENDERNESS IN MOUTH AND THROAT WITH OR WITHOUT PRESENCE OF ULCERS (sore throat, sores in mouth, or a toothache) UNUSUAL RASH, SWELLING OR PAIN  UNUSUAL VAGINAL DISCHARGE OR ITCHING   Items with * indicate a potential emergency and should be followed up as soon as possible or go to the Emergency Department if any problems should occur.  Please show the CHEMOTHERAPY ALERT CARD or IMMUNOTHERAPY  ALERT CARD at check-in to the Emergency Department and triage nurse.  Should you have questions after your visit or need to cancel or reschedule your appointment, please contact [facility] - A DEPT OF [facility]  Dept: [phone]  and follow the prompts.  Office hours are 8:00 a.m. to 4:30 p.m. Monday - Friday. Please note that voicemails left after 4:00 p.m. may not be returned until the following business day.  We are closed weekends and major holidays. You have access to a nurse at all times for urgent questions. Please call the main number to the clinic Dept: [phone] and follow the prompts.   For any non-urgent questions, you may also contact your provider using MyChart. We now offer e-Visits for anyone 65 and older to request care online for non-urgent symptoms. For details visit [email or web address].   Also download the MyChart app! Go to the app store, search "MyChart", open the app, select , and log in with your MyChart username and password.

## 2023-11-18 NOTE — Progress Notes (Signed)
Ok to proceed with chemo today despite HR=103 per Dr. Mosetta Putt.

## 2023-11-18 NOTE — Progress Notes (Signed)
[facility]   [phone] [phone]   Clinic Follow up Note   Patient Care Team: Ivonne Andrew, NP as PCP - General (Pulmonary Disease) Cherlyn Cushing, RN as Oncology Nurse Navigator Malachy Mood, MD as Consulting Physician (Hematology and Oncology)  Date of Service:  11/18/2023  CHIEF COMPLAINT: f/u of metastatic prostate cancer  CURRENT THERAPY:  Hemotherapy docetaxel every 3 weeks  Oncology History   Prostate cancer [facility]) -Staging IV with node metastasis, castration resistant. -Diagnosed in 2020, initially presented with Gleason score 9 and PSA 42 -he started ADT in 2020, initially responded with PSA dropped to 2.77 in November 2021.  PSA started rising and went to 9.3 in November 2022.  Patient was not very compliant with his treatment. -He received palliative radiation to the left chest wall and left supraclavicular nodes in 2023. -He started Xtandi 160 mg daily in July 2023. He responded well and PSA has been less than 1 since then. -During his recent visit with [facility] Dr.Dr. Mena Goes on 06/17/2023, he was found to have elevated PSA 5.73, and he has developed low back pain and bilateral hip pain, concerning for disease progression. -He underwent restaging PSMA PET scan on July 16, 2023.  The scan has not been read by radiologist.  I personally reviewed the the scan images and compared to his previous PET scan from 2023, which showed mixed response, with worsening bone metastasis in pelvis and bilateral hip, but the improvement in the adenopathy and bone mets in lower neck and upper chest, probably partially related to previous radiation. -his PET scan from July 21, 2023 showed mixed response, but overall worsening bone metastasis.  I recommended changing treatment to chemotherapy docetaxel every 3 weeks, for up to 10 cycles. -Goal of chemotherapy is palliative, for disease control and prolong his life. -He is tolerating chemotherapy moderately well with  some neuropathy.    Assessment and Plan    Prostate Cancer with Bone Metastasis Follow-up for prostate cancer with bone metastasis. Reports mild right hip pain when walking, relieved by rest. Occasional aching radiates down the leg, relieved by squeezing. Neuropathy from chemotherapy is well-managed. PSA levels are decreasing, indicating a positive response to treatment. Currently on cycle six of chemotherapy, with plans to continue up to ten cycles if tolerated. Blood counts show mild anemia, no transfusion needed. Kidney and liver function tests pending. Discussed Pluvicto as next treatment if disease progresses. - Proceed with chemotherapy today - Schedule next chemotherapy treatments for December 18 and January 8 - Monitor PSA levels and clinical symptoms, he is PSA has decreased since he started chemotherapy indicating response to treatment. -Will transfer his care to Dr. Cherly Hensen for future follow-ups  Right Hip Pain Mild right hip pain primarily when walking, likely muscular in origin. Extensive bone metastasis could also contribute. - Monitor symptoms and consider imaging if pain worsens or becomes constant  Chemotherapy-Induced Neuropathy Intermittent burning and aching in the fingertips, consistent with chemotherapy-induced neuropathy. Symptoms are well-managed. - Continue monitoring neuropathy symptoms - Adjust chemotherapy dose if neuropathy worsens  General Health Maintenance Stable blood counts, no need for blood transfusion. Kidney and liver function tests pending. - Review kidney and liver function test results when available  Plan -Lab reviewed, adequate for treatment, will proceed to cycle 6 docetaxel today and continue every 3 weeks -I will transfer his care to Dr. Cherly Hensen, patient is agreeable. -Follow-up in 3 weeks with next cycle chemotherapy     SUMMARY OF ONCOLOGIC HISTORY: Oncology  History Overview Note   Cancer Staging  Prostate cancer (HCC) Staging form:  Prostate, AJCC 8th Edition - Clinical: Stage IVB (cTX, cNX, pM1b, PSA: 42, Grade Group: 5) - Signed by Benjiman Core, MD on 07/08/2022 Prostate specific antigen (PSA) range: 20 or greater Histologic grading system: 5 grade system     Prostate cancer (HCC)  12/21/2019 Initial Diagnosis   Prostate cancer (HCC)   07/08/2022 Cancer Staging   Staging form: Prostate, AJCC 8th Edition - Clinical: Stage IVB (cTX, cNX, pM1b, PSA: 42, Grade Group: 5) - Signed by Benjiman Core, MD on 07/08/2022 Prostate specific antigen (PSA) range: 20 or greater Histologic grading system: 5 grade system   Metastasis to bone (HCC)  07/08/2022 Initial Diagnosis   Metastasis to bone (HCC)   07/16/2023 PET scan    IMPRESSION: 1. Mixed response to therapy. 2. Interval decrease in size of the prostate gland with mild residual tracer uptake within the right posterior gland. 3. Interval decrease in size of thoracic and abdominal adenopathy. 4. Persistent, multifocal tracer avid bone metastases. Overall, the tracer avid lesions appear increased in size and degree of tracer uptake. Lesion within the right calvarium has resolved in the interval.    Miscellaneous   [facility]      Malignant neoplasm of prostate metastatic to lymph nodes of multiple sites (HCC)  07/08/2022 Initial Diagnosis   Malignant neoplasm of prostate metastatic to lymph nodes of multiple sites [facility])   08/05/2023 -  Chemotherapy   Patient is on Treatment Plan : PROSTATE Docetaxel (75) + Prednisone q21d        Discussed the use of AI scribe software for clinical note transcription with the patient, who gave verbal consent to proceed.  History of Present Illness   A 57 year old patient with a history of prostate cancer presents for a follow-up visit. The patient reports new onset right hip pain that started a week ago. The pain is described as mild and pulling in nature, exacerbated by walking and relieved by rest. The patient also  reports intermittent left leg pain that radiates down the leg and is relieved by squeezing the leg. The patient also reports intermittent burning sensation in the fingertips, which is not worsening. The patient denies any other new symptoms.         All other systems were reviewed with the patient and are negative.  MEDICAL HISTORY:  Past Medical History:  Diagnosis Date   Benign prostate hyperplasia    Elevated PSA    Erectile dysfunction    Hypertension    Obesity (BMI 35.0-39.9 without comorbidity)    Prostate cancer (HCC) 05/2019   Sleep apnea    Urinary retention     SURGICAL HISTORY: Past Surgical History:  Procedure Laterality Date   COLONOSCOPY N/A 10/14/2017   Procedure: COLONOSCOPY;  Surgeon: Iva Boop, MD;  Location: [facility] ENDOSCOPY;  Service: Endoscopy;  Laterality: N/A;   ESOPHAGOGASTRODUODENOSCOPY (EGD) WITH PROPOFOL N/A 08/04/2022   Procedure: ESOPHAGOGASTRODUODENOSCOPY (EGD) WITH PROPOFOL;  Surgeon: Charna Elizabeth, MD;  Location: [facility] ENDOSCOPY;  Service: Gastroenterology;  Laterality: N/A;   IR IMAGING GUIDED PORT INSERTION  08/06/2023    I have reviewed the social history and family history with the patient and they are unchanged from previous note.  ALLERGIES:  has No Known Allergies.  MEDICATIONS:  Current Outpatient Medications  Medication Sig Dispense Refill   albuterol (VENTOLIN HFA) 108 (90 Base) MCG/ACT inhaler Inhale 2 puffs into the lungs every 6 (six) hours as needed  for wheezing or shortness of breath. 18 g 6   ascorbic acid (VITAMIN C) 500 MG tablet Take 500 mg by mouth daily.     aspirin EC 81 MG tablet Take 81 mg by mouth daily. (Patient not taking: Reported on 10/28/2023)     Calcium Carbonate-Vitamin D (CALCIUM 500/D) 500-125 MG-UNIT TABS Take 1 tablet by mouth daily.     cyanocobalamin 1000 MCG tablet Take 2 tablets (2,000 mcg total) by mouth daily. 30 tablet 0   cyclobenzaprine (FLEXERIL) 10 MG tablet Take 1 tablet (10 mg total) by mouth 3  (three) times daily as needed for muscle spasms. 30 tablet 1   fluticasone (FLONASE) 50 MCG/ACT nasal spray Place 2 sprays into both nostrils daily. 16 g 6   gabapentin (NEURONTIN) 100 MG capsule Take 1-2 capsules (100-200 mg total) by mouth 2 (two) times daily as needed for neuropathy 120 capsule 0   hydrochlorothiazide (HYDRODIURIL) 25 MG tablet TAKE 1/2 TABLET (12.5 MG TOTAL) BY MOUTH DAILY. 45 tablet 3   lidocaine-prilocaine (EMLA) cream Apply to affected area once 30 g 3   lisinopril (ZESTRIL) 20 MG tablet Take 1 tablet (20 mg total) by mouth daily. 90 tablet 3   nystatin cream (MYCOSTATIN) Apply 1 Application topically 2 (two) times daily. 30 g 1   ondansetron (ZOFRAN) 8 MG tablet Take 1 tablet (8 mg total) by mouth every 8 (eight) hours as needed for nausea or vomiting. 30 tablet 1   predniSONE (DELTASONE) 5 MG tablet Take 1 tablet (5 mg total) by mouth in the morning and at bedtime. 60 tablet 11   prochlorperazine (COMPAZINE) 10 MG tablet Take 1 tablet (10 mg total) by mouth every 6 (six) hours as needed for nausea or vomiting. 30 tablet 1   sodium chloride (OCEAN) 0.65 % SOLN nasal spray Place 1 spray into both nostrils as needed for congestion. 30 mL 0   tadalafil (CIALIS) 20 MG tablet Take 1 tablet (20 mg total) by mouth daily as needed. 10 tablet 11   tamsulosin (FLOMAX) 0.4 MG CAPS capsule Take 1 capsule (0.4 mg total) by mouth daily. 30 capsule 3   traMADol (ULTRAM) 50 MG tablet Take 1 - 2 tablets (50 - 100 mg total) by mouth every 6 hours as needed for moderate pain or severe pain. 90 tablet 0   VIAGRA 25 MG tablet Take 1 tablet (25 mg total) by mouth daily as needed for erectile dysfunction. 10 tablet 0   No current facility-administered medications for this visit.    PHYSICAL EXAMINATION: ECOG PERFORMANCE STATUS: 1 - Symptomatic but completely ambulatory  Vitals:   11/18/23 0826  BP: 128/80  Pulse: (!) 103  Resp: 18  Temp: 98.1 F (36.7 C)  SpO2: 97%   Wt Readings from  Last 3 Encounters:  11/18/23 252 lb 1.6 oz (114.4 kg)  10/28/23 251 lb 4.8 oz (114 kg)  10/07/23 249 lb 14.4 oz (113.4 kg)     GENERAL:alert, no distress and comfortable SKIN: skin color, texture, turgor are normal, no rashes or significant lesions EYES: normal, Conjunctiva are pink and non-injected, sclera clear NECK: supple, thyroid normal size, non-tender, without nodularity LYMPH:  no palpable lymphadenopathy in the cervical, axillary  LUNGS: clear to auscultation and percussion with normal breathing effort HEART: regular rate & rhythm and no murmurs and no lower extremity edema ABDOMEN:abdomen soft, non-tender and normal bowel sounds Musculoskeletal:no cyanosis of digits and no clubbing  NEURO: alert & oriented x 3 with fluent speech, no focal  motor/sensory deficits    LABORATORY DATA:  I have reviewed the data as listed    Latest Ref Rng & Units 11/18/2023    8:09 AM 10/28/2023    7:59 AM 10/07/2023    7:59 AM  CBC  WBC 4.0 - 10.5 K/uL 7.4  6.5  5.4   Hemoglobin 13.0 - 17.0 g/dL 9.5  9.7  9.5   Hematocrit 39.0 - 52.0 % 29.6  30.2  29.1   Platelets 150 - 400 K/uL 262  232  262         Latest Ref Rng & Units 10/28/2023    7:59 AM 10/07/2023    7:59 AM 09/16/2023    8:27 AM  CMP  Glucose 70 - 99 mg/dL 161  096  045   BUN 6 - 20 mg/dL 22  21  23    Creatinine 0.61 - 1.24 mg/dL 4.09  8.11  9.14   Sodium 135 - 145 mmol/L 139  140  139   Potassium 3.5 - 5.1 mmol/L 3.8  3.6  3.7   Chloride 98 - 111 mmol/L 107  106  106   CO2 22 - 32 mmol/L 24  24  26    Calcium 8.9 - 10.3 mg/dL 9.1  9.2  9.4   Total Protein 6.5 - 8.1 g/dL 6.9  6.9  7.0   Total Bilirubin <1.2 mg/dL 0.3  0.3  0.3   Alkaline Phos 38 - 126 U/L 190  215  407   AST 15 - 41 U/L 10  12  9    ALT 0 - 44 U/L 7  11  9        RADIOGRAPHIC STUDIES: I have personally reviewed the radiological images as listed and agreed with the findings in the report. No results found.    Orders Placed This Encounter   Procedures   CBC with Differential (Cancer Center Only)    Standing Status:   Future    Standing Expiration Date:   01/19/2025   CMP (Cancer Center only)    Standing Status:   Future    Standing Expiration Date:   01/19/2025   CBC with Differential (Cancer Center Only)    Standing Status:   Future    Standing Expiration Date:   02/09/2025   CMP (Cancer Center only)    Standing Status:   Future    Standing Expiration Date:   02/09/2025   All questions were answered. The patient knows to call the clinic with any problems, questions or concerns. No barriers to learning was detected. The total time spent in the appointment was 25 minutes.     Malachy Mood, MD 11/18/2023

## 2023-11-19 LAB — PSA, TOTAL AND FREE
PSA, Free Pct: 26.8 %
PSA, Free: 2.36 ng/mL
Prostate Specific Ag, Serum: 8.8 ng/mL — ABNORMAL HIGH (ref 0.0–4.0)

## 2023-11-20 ENCOUNTER — Ambulatory Visit: Payer: Medicaid Other

## 2023-11-20 ENCOUNTER — Inpatient Hospital Stay: Payer: Medicaid Other

## 2023-11-20 VITALS — BP 151/92 | HR 119 | Temp 98.1°F | Resp 18

## 2023-11-20 DIAGNOSIS — C61 Malignant neoplasm of prostate: Secondary | ICD-10-CM | POA: Diagnosis not present

## 2023-11-20 DIAGNOSIS — Z5111 Encounter for antineoplastic chemotherapy: Secondary | ICD-10-CM | POA: Diagnosis not present

## 2023-11-20 DIAGNOSIS — C7951 Secondary malignant neoplasm of bone: Secondary | ICD-10-CM | POA: Diagnosis not present

## 2023-11-20 DIAGNOSIS — C779 Secondary and unspecified malignant neoplasm of lymph node, unspecified: Secondary | ICD-10-CM | POA: Diagnosis not present

## 2023-11-20 DIAGNOSIS — Z5189 Encounter for other specified aftercare: Secondary | ICD-10-CM | POA: Diagnosis not present

## 2023-11-20 DIAGNOSIS — C778 Secondary and unspecified malignant neoplasm of lymph nodes of multiple regions: Secondary | ICD-10-CM

## 2023-11-20 MED ORDER — PEGFILGRASTIM-FPGK 6 MG/0.6ML ~~LOC~~ SOSY
6.0000 mg | PREFILLED_SYRINGE | Freq: Once | SUBCUTANEOUS | Status: AC
  Administered 2023-11-20: 6 mg via SUBCUTANEOUS
  Filled 2023-11-20: qty 0.6

## 2023-11-23 ENCOUNTER — Encounter: Payer: Self-pay | Admitting: Genetic Counselor

## 2023-11-30 ENCOUNTER — Other Ambulatory Visit: Payer: Self-pay

## 2023-12-10 NOTE — Assessment & Plan Note (Signed)
On lisinopril and HCTZ

## 2023-12-10 NOTE — Assessment & Plan Note (Addendum)
Repeat PSA and testosterone today Repeat PSMA PET Docetaxel  If local progression at left hip, will refer for palliative XRT

## 2023-12-10 NOTE — Progress Notes (Addendum)
Patient Care Team: Ivonne Andrew, NP as PCP - General (Pulmonary Disease) Cherlyn Cushing, RN as Oncology Nurse Navigator Malachy Mood, MD as Consulting Physician (Hematology and Oncology)  Clinic Day:  12/11/2023  Referring physician: Malachy Mood, MD  ASSESSMENT & PLAN:   Assessment & Plan: 57 y.o. m. With history of HTN, OSA prostate cancer here for follow up.  Current diagnosis: mCRPC with bone metastases Initial diagnosis: GS 4+5 GG5 PSA 42 with bone metastases Germline testing: [employer] BRIP1 p. [id number] Variant, Likely Benign Somatic testing: negative for HRRm, neg for dMMR. TMB 1 Muts/Mb Previous treatment: ADT (at [facility]), ARPI (was given apalutamide but never started, enza 06/2022-08/2023) PSA nadir 12/16/22 at 0.3  Current treatment: 08/05/23 - current: docetaxel. Completed 6 cycles  This is his first visit with me. PSA response is minimal. Recommend restaging scan.  He is having more pain on the left side of the hip.  Report of improvement after docetaxel at higher dose.  Neuropathy has been intermittent.  We increased the dose this week.  Continue monitor for neuropathy.  We discussed that if signs of progression, consider Pluvicto.  If there is worsening pain from bone metastases, we can refer for palliative radiation in the meantime as well after next dose of docetaxel.  Will follow-up with PET in a few weeks.  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  He knows the maximum dose limit. Follow-up with palliative care NP [name]     The patient understands the plans discussed today and is in agreement with them.  He knows to contact our office if  he develops concerns prior to his next appointment.  Melven Sartorius, MD  [facility] [address] [place] [address] Dept: [phone] Dept Fax: [phone]   Orders Placed This Encounter  Procedures   NM PET (PSMA) SKULL TO MID THIGH    Standing Status:   Future    Expected Date:   12/17/2023    Expiration Date:   12/10/2024    If indicated for the ordered procedure, I authorize the administration of a radiopharmaceutical per Radiology protocol:   Yes    Preferred imaging location?:   [facility]   PSA, total and free    Standing Status:   Future    Number of Occurrences:   1    Expected Date:   12/10/2023    Expiration Date:   12/09/2024   Testosterone    Standing Status:   Future    Number of Occurrences:   1    Expected Date:   12/10/2023    Expiration Date:   12/09/2024      CHIEF COMPLAINT:  CC: mCRPC  Current Treatment:  docetaxel  INTERVAL HISTORY:  Craig Collins is here today for repeat clinical assessment. He is transitioning care to me for history of mCRPC.  Left hip pain since Tuesday. He thinks his neuropathy improved but up on gabapentin. He wonders if left side pain is more. No other new back. He was taking Tramadol but not working. He is taking Tylenol as needed.  No difficulty with urinating, fever or signs of infection.  Appetite is good.  Neuropathy is at hands and feet.  Prostate cancer history  -Diagnosed in 03/2019, initially presented with Gleason score 9 and PSA 42 -he started ADT in 2020, initially responded with PSA dropped to 2.77 in November 2021.  PSA started rising and went to 9.3 in November 2022.  Patient was not very compliant with his treatment. -He received palliative radiation to the left chest wall and left supraclavicular nodes in 2023. -He started Xtandi 160 mg daily in July 2023. He responded well and PSA has been less than 1 since  then. -During his recent visit with Dr. Mena Goes on 06/17/2023, he was found to have elevated PSA 5.73, and he has developed low back pain and bilateral hip pain, concerning for disease progression. -He underwent restaging PSMA PET scan on July 16, 2023. Report of mixed response, with worsening bone metastasis in pelvis and bilateral hip, but the improvement in the adenopathy and bone mets in lower neck and upper chest, probably partially related to previous radiation.  I recommended changing treatment to chemotherapy docetaxel every 3 weeks, for up to 10 cycles.  I have reviewed the past medical history, past surgical history, social history and family history with the patient and they are unchanged from previous note.  Past Medical History:  Diagnosis Date   Benign prostate hyperplasia    Elevated PSA    Erectile dysfunction    Hypertension    Obesity (BMI 35.0-39.9 without comorbidity)    Prostate cancer (HCC) 05/2019   Sleep apnea    Urinary retention     ALLERGIES:  has no known allergies.  MEDICATIONS:  Current Outpatient Medications  Medication Sig Dispense Refill   albuterol (VENTOLIN HFA) 108 (90 Base) MCG/ACT inhaler Inhale 2 puffs into the lungs every 6 (six) hours as needed for wheezing or shortness of breath. 18 g 6   ascorbic acid (VITAMIN C) 500 MG tablet Take 500 mg by mouth daily.     aspirin EC 81 MG tablet Take 81 mg by mouth daily. (Patient not taking: Reported on 10/28/2023)     Calcium Carbonate-Vitamin D (CALCIUM 500/D) 500-125 MG-UNIT TABS Take 1 tablet by mouth daily.     cyanocobalamin 1000 MCG tablet Take 2 tablets (2,000 mcg total) by mouth daily. 30 tablet 0   cyclobenzaprine (FLEXERIL) 10 MG tablet Take 1 tablet (10 mg total) by mouth 3 (three) times daily as needed for muscle spasms. 30 tablet 1   fluticasone (FLONASE) 50 MCG/ACT nasal spray Place 2 sprays into both nostrils daily. 16 g 6   gabapentin (NEURONTIN) 100 MG capsule Take 1-2 capsules (100-200 mg  total) by mouth 2 (two) times daily as needed for neuropathy 120 capsule 0   hydrochlorothiazide (HYDRODIURIL) 25 MG tablet TAKE 1/2 TABLET (12.5 MG TOTAL) BY MOUTH DAILY. 45 tablet 3   lidocaine-prilocaine (EMLA) cream Apply to affected area once 30 g 3   lisinopril (ZESTRIL) 20 MG tablet Take 1 tablet (20 mg total) by mouth daily. 90 tablet 3   nystatin cream (MYCOSTATIN) Apply 1 Application topically 2 (two) times daily. 30 g 1   ondansetron (ZOFRAN) 8 MG tablet Take 1 tablet (8 mg total) by mouth every 8 (eight) hours as needed for nausea or vomiting. 30 tablet 1   predniSONE (DELTASONE) 5 MG tablet Take 1 tablet (5 mg total) by mouth in the morning and at bedtime. 60 tablet 11   prochlorperazine (COMPAZINE) 10 MG tablet Take 1 tablet (10 mg total) by mouth every  6 (six) hours as needed for nausea or vomiting. 30 tablet 1   sodium chloride (OCEAN) 0.65 % SOLN nasal spray Place 1 spray into both nostrils as needed for congestion. 30 mL 0   tadalafil (CIALIS) 20 MG tablet Take 1 tablet (20 mg total) by mouth daily as needed. 10 tablet 11   tamsulosin (FLOMAX) 0.4 MG CAPS capsule Take 1 capsule (0.4 mg total) by mouth daily. 30 capsule 3   traMADol (ULTRAM) 50 MG tablet Take 1 - 2 tablets (50 - 100 mg total) by mouth every 6 hours as needed for moderate pain or severe pain. 90 tablet 0   VIAGRA 25 MG tablet Take 1 tablet (25 mg total) by mouth daily as needed for erectile dysfunction. 10 tablet 0   No current facility-administered medications for this visit.    HISTORY OF PRESENT ILLNESS:   Oncology History Overview Note   Cancer Staging  Prostate cancer [facility]) Staging form: Prostate, AJCC 8th Edition - Clinical: Stage IVB (cTX, cNX, pM1b, PSA: 42, Grade Group: 5) - Signed by Benjiman Core, MD on 07/08/2022 Prostate specific antigen (PSA) range: 20 or greater Histologic grading system: 5 grade system     Prostate cancer (HCC) (Resolved)  12/21/2019 Initial Diagnosis   Prostate cancer  (HCC)   07/08/2022 Cancer Staging   Staging form: Prostate, AJCC 8th Edition - Clinical: Stage IVB (cTX, cNX, pM1b, PSA: 42, Grade Group: 5) - Signed by Benjiman Core, MD on 07/08/2022 Prostate specific antigen (PSA) range: 20 or greater Histologic grading system: 5 grade system   Metastasis to bone (HCC) (Resolved)  07/08/2022 Initial Diagnosis   Metastasis to bone (HCC)   07/16/2023 PET scan    IMPRESSION: 1. Mixed response to therapy. 2. Interval decrease in size of the prostate gland with mild residual tracer uptake within the right posterior gland. 3. Interval decrease in size of thoracic and abdominal adenopathy. 4. Persistent, multifocal tracer avid bone metastases. Overall, the tracer avid lesions appear increased in size and degree of tracer uptake. Lesion within the right calvarium has resolved in the interval.    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   VITALS:  Blood pressure (!) 127/95, pulse (!) 122, temperature (!) 97.2 F (36.2 C), resp. rate 20, weight 242 lb 9.6 oz (110 kg), SpO2 98%.  Wt Readings from Last 3 Encounters:  12/11/23 242 lb 9.6 oz (110 kg)  11/18/23 252 lb 1.6 oz (114.4 kg)  10/28/23 251 lb 4.8 oz (114 kg)    Body mass index is 34.81 kg/m.  Performance status (ECOG): 2 - Symptomatic, <50% confined to bed  PHYSICAL EXAM:   GENERAL:alert, no distress and comfortable SKIN: skin color normal, no rashes  EYES: normal, sclera clear OROPHARYNX: no exudate, no erythema    LUNGS: clear to auscultation with normal breathing effort.  No wheeze or rales HEART:  Tachycardic ABDOMEN: abdomen soft, non-tender and nondistended Musculoskeletal: no edema  LABORATORY DATA:  I have reviewed the data as listed    Component Value Date/Time   NA 138 12/11/2023 1047   NA  142 10/22/2022 1548   K 3.8 12/11/2023 1047   CL 104 12/11/2023 1047   CO2 25 12/11/2023 1047   GLUCOSE 104 (H) 12/11/2023 1047   BUN 18 12/11/2023 1047   BUN 19 10/22/2022 1548   CREATININE 1.10 12/11/2023 1047   CREATININE 1.39 (H) 10/28/2017 0958   CALCIUM 10.2 12/11/2023 1047   PROT 7.4 12/11/2023 1047   PROT 6.6 10/22/2022 1548   ALBUMIN 4.2 12/11/2023 1047   ALBUMIN 3.9 10/22/2022 1548   AST 14 (L) 12/11/2023 1047   ALT 8 12/11/2023 1047   ALKPHOS 234 (H) 12/11/2023 1047   BILITOT 0.9 12/11/2023 1047   GFRNONAA >60 12/11/2023 1047   GFRAA 65 06/18/2020 0921    No results found for: "SPEP", "UPEP"  Lab Results  Component Value Date   WBC 12.1 (H) 12/11/2023   NEUTROABS 10.0 (H) 12/11/2023   HGB 10.6 (L) 12/11/2023   HCT 32.8 (L) 12/11/2023   MCV 92.9 12/11/2023   PLT 280 12/11/2023      Chemistry      Component Value Date/Time   NA 138 12/11/2023 1047   NA 142 10/22/2022 1548   K 3.8 12/11/2023 1047   CL 104 12/11/2023 1047   CO2 25 12/11/2023 1047   BUN 18 12/11/2023 1047   BUN 19 10/22/2022 1548   CREATININE 1.10 12/11/2023 1047   CREATININE 1.39 (H) 10/28/2017 0958      Component Value Date/Time   CALCIUM 10.2 12/11/2023 1047   ALKPHOS 234 (H) 12/11/2023 1047   AST 14 (L) 12/11/2023 1047   ALT 8 12/11/2023 1047   BILITOT 0.9 12/11/2023 1047       RADIOGRAPHIC STUDIES: I have personally reviewed the radiological images as listed and agreed with the findings in the report. No results found.

## 2023-12-11 ENCOUNTER — Inpatient Hospital Stay (HOSPITAL_BASED_OUTPATIENT_CLINIC_OR_DEPARTMENT_OTHER): Payer: Medicaid Other

## 2023-12-11 ENCOUNTER — Inpatient Hospital Stay: Payer: Medicaid Other

## 2023-12-11 ENCOUNTER — Inpatient Hospital Stay: Payer: Medicaid Other | Attending: Nurse Practitioner

## 2023-12-11 VITALS — BP 127/95 | HR 122 | Temp 97.2°F | Resp 20 | Wt 242.6 lb

## 2023-12-11 VITALS — BP 117/82 | HR 116

## 2023-12-11 DIAGNOSIS — C7951 Secondary malignant neoplasm of bone: Secondary | ICD-10-CM | POA: Insufficient documentation

## 2023-12-11 DIAGNOSIS — C61 Malignant neoplasm of prostate: Secondary | ICD-10-CM | POA: Diagnosis not present

## 2023-12-11 DIAGNOSIS — Z5111 Encounter for antineoplastic chemotherapy: Secondary | ICD-10-CM | POA: Insufficient documentation

## 2023-12-11 DIAGNOSIS — Z192 Hormone resistant malignancy status: Secondary | ICD-10-CM

## 2023-12-11 DIAGNOSIS — I10 Essential (primary) hypertension: Secondary | ICD-10-CM | POA: Diagnosis not present

## 2023-12-11 DIAGNOSIS — C778 Secondary and unspecified malignant neoplasm of lymph nodes of multiple regions: Secondary | ICD-10-CM | POA: Diagnosis not present

## 2023-12-11 DIAGNOSIS — G629 Polyneuropathy, unspecified: Secondary | ICD-10-CM | POA: Insufficient documentation

## 2023-12-11 DIAGNOSIS — Z95828 Presence of other vascular implants and grafts: Secondary | ICD-10-CM

## 2023-12-11 DIAGNOSIS — Z5189 Encounter for other specified aftercare: Secondary | ICD-10-CM | POA: Diagnosis not present

## 2023-12-11 DIAGNOSIS — G893 Neoplasm related pain (acute) (chronic): Secondary | ICD-10-CM | POA: Diagnosis not present

## 2023-12-11 LAB — CMP (CANCER CENTER ONLY)
ALT: 8 U/L (ref 0–44)
AST: 14 U/L — ABNORMAL LOW (ref 15–41)
Albumin: 4.2 g/dL (ref 3.5–5.0)
Alkaline Phosphatase: 234 U/L — ABNORMAL HIGH (ref 38–126)
Anion gap: 9 (ref 5–15)
BUN: 18 mg/dL (ref 6–20)
CO2: 25 mmol/L (ref 22–32)
Calcium: 10.2 mg/dL (ref 8.9–10.3)
Chloride: 104 mmol/L (ref 98–111)
Creatinine: 1.1 mg/dL (ref 0.61–1.24)
GFR, Estimated: >60 mL/min (ref >60)
Glucose, Bld: 104 mg/dL — ABNORMAL HIGH (ref 70–99)
Potassium: 3.8 mmol/L (ref 3.5–5.1)
Sodium: 138 mmol/L (ref 135–145)
Total Bilirubin: 0.9 mg/dL (ref <1.2)
Total Protein: 7.4 g/dL (ref 6.5–8.1)

## 2023-12-11 LAB — CBC WITH DIFFERENTIAL (CANCER CENTER ONLY)
Abs Immature Granulocytes: 0.04 10*3/uL (ref 0.00–0.07)
Basophils Absolute: 0 10*3/uL (ref 0.0–0.1)
Basophils Relative: 0 %
Eosinophils Absolute: 0.1 10*3/uL (ref 0.0–0.5)
Eosinophils Relative: 1 %
HCT: 32.8 % — ABNORMAL LOW (ref 39.0–52.0)
Hemoglobin: 10.6 g/dL — ABNORMAL LOW (ref 13.0–17.0)
Immature Granulocytes: 0 %
Lymphocytes Relative: 6 %
Lymphs Abs: 0.7 10*3/uL (ref 0.7–4.0)
MCH: 30 pg (ref 26.0–34.0)
MCHC: 32.3 g/dL (ref 30.0–36.0)
MCV: 92.9 fL (ref 80.0–100.0)
Monocytes Absolute: 1.3 10*3/uL — ABNORMAL HIGH (ref 0.1–1.0)
Monocytes Relative: 10 %
Neutro Abs: 10 10*3/uL — ABNORMAL HIGH (ref 1.7–7.7)
Neutrophils Relative %: 83 %
Platelet Count: 280 10*3/uL (ref 150–400)
RBC: 3.53 MIL/uL — ABNORMAL LOW (ref 4.22–5.81)
RDW: 15 % (ref 11.5–15.5)
WBC Count: 12.1 10*3/uL — ABNORMAL HIGH (ref 4.0–10.5)
nRBC: 0 % (ref 0.0–0.2)

## 2023-12-11 MED ORDER — SODIUM CHLORIDE 0.9% FLUSH
10.0000 mL | INTRAVENOUS | Status: DC | PRN
Start: 2023-12-11 — End: 2023-12-11
  Administered 2023-12-11: 10 mL

## 2023-12-11 MED ORDER — SODIUM CHLORIDE 0.9 % IV SOLN
65.0000 mg/m2 | Freq: Once | INTRAVENOUS | Status: AC
  Administered 2023-12-11: 160 mg via INTRAVENOUS
  Filled 2023-12-11: qty 16

## 2023-12-11 MED ORDER — HEPARIN SOD (PORK) LOCK FLUSH 100 UNIT/ML IV SOLN
500.0000 [IU] | Freq: Once | INTRAVENOUS | Status: AC | PRN
  Administered 2023-12-11: 500 [IU]

## 2023-12-11 MED ORDER — DEXAMETHASONE SODIUM PHOSPHATE 10 MG/ML IJ SOLN
10.0000 mg | Freq: Once | INTRAMUSCULAR | Status: AC
  Administered 2023-12-11: 10 mg via INTRAVENOUS
  Filled 2023-12-11: qty 1

## 2023-12-11 MED ORDER — SODIUM CHLORIDE 0.9% FLUSH
10.0000 mL | Freq: Once | INTRAVENOUS | Status: AC
  Administered 2023-12-11: 10 mL

## 2023-12-11 MED ORDER — SODIUM CHLORIDE 0.9 % IV SOLN: Freq: Once | INTRAVENOUS | Status: AC

## 2023-12-11 NOTE — Patient Instructions (Signed)
 [facility] - A DEPT OF [facility]  Discharge Instructions: Thank you for choosing [facility] to provide your oncology and hematology care.   If you have a lab appointment with the Cancer Center, please go directly to the Cancer Center and check in at the registration area.   Wear comfortable clothing and clothing appropriate for easy access to any Portacath or PICC line.   We strive to give you quality time with your provider. You may need to reschedule your appointment if you arrive late (15 or more minutes).  Arriving late affects you and other patients whose appointments are after yours.  Also, if you miss three or more appointments without notifying the office, you may be dismissed from the clinic at the provider's discretion.      For prescription refill requests, have your pharmacy contact our office and allow 72 hours for refills to be completed.    Today you received the following chemotherapy and/or immunotherapy agents: Docetaxel      To help prevent nausea and vomiting after your treatment, we encourage you to take your nausea medication as directed.  BELOW ARE SYMPTOMS THAT SHOULD BE REPORTED IMMEDIATELY: *FEVER GREATER THAN 100.4 F (38 C) OR HIGHER *CHILLS OR SWEATING *NAUSEA AND VOMITING THAT IS NOT CONTROLLED WITH YOUR NAUSEA MEDICATION *UNUSUAL SHORTNESS OF BREATH *UNUSUAL BRUISING OR BLEEDING *URINARY PROBLEMS (pain or burning when urinating, or frequent urination) *BOWEL PROBLEMS (unusual diarrhea, constipation, pain near the anus) TENDERNESS IN MOUTH AND THROAT WITH OR WITHOUT PRESENCE OF ULCERS (sore throat, sores in mouth, or a toothache) UNUSUAL RASH, SWELLING OR PAIN  UNUSUAL VAGINAL DISCHARGE OR ITCHING   Items with * indicate a potential emergency and should be followed up as soon as possible or go to the Emergency Department if any problems should occur.  Please show the CHEMOTHERAPY ALERT CARD or IMMUNOTHERAPY  ALERT CARD at check-in to the Emergency Department and triage nurse.  Should you have questions after your visit or need to cancel or reschedule your appointment, please contact [facility]  Dept: [phone]  and follow the prompts.  Office hours are 8:00 a.m. to 4:30 p.m. Monday - Friday. Please note that voicemails left after 4:00 p.m. may not be returned until the following business day.  We are closed weekends and major holidays. You have access to a nurse at all times for urgent questions. Please call the main number to the clinic Dept: [phone] and follow the prompts.   For any non-urgent questions, you may also contact your provider using MyChart. We now offer e-Visits for anyone 67 and older to request care online for non-urgent symptoms. For details visit [email or web address].   Also download the MyChart app! Go to the app store, search "MyChart", open the app, select [facility], and log in with your MyChart username and password.`

## 2023-12-11 NOTE — Assessment & Plan Note (Signed)
Gabapentin helps Recommend continue gabapentin

## 2023-12-11 NOTE — Progress Notes (Signed)
Ok to treat with elevated HR per Dr. Cherly Hensen.

## 2023-12-11 NOTE — Assessment & Plan Note (Signed)
Okay to alternate tramadol and Tylenol.  He knows the maximum dose limit. Follow-up with palliative care NP [facility]

## 2023-12-12 LAB — TESTOSTERONE: Testosterone: <3 ng/dL — ABNORMAL LOW (ref 264–916)

## 2023-12-12 LAB — PSA, TOTAL AND FREE
PSA, Free Pct: 28 %
PSA, Free: 4.9 ng/mL
Prostate Specific Ag, Serum: 17.5 ng/mL — ABNORMAL HIGH (ref 0.0–4.0)

## 2023-12-14 ENCOUNTER — Inpatient Hospital Stay: Payer: Medicaid Other

## 2023-12-14 ENCOUNTER — Other Ambulatory Visit: Payer: Self-pay

## 2023-12-14 VITALS — BP 128/87 | HR 109 | Temp 99.3°F | Resp 18

## 2023-12-14 DIAGNOSIS — G893 Neoplasm related pain (acute) (chronic): Secondary | ICD-10-CM | POA: Diagnosis not present

## 2023-12-14 DIAGNOSIS — C7951 Secondary malignant neoplasm of bone: Secondary | ICD-10-CM | POA: Diagnosis not present

## 2023-12-14 DIAGNOSIS — C61 Malignant neoplasm of prostate: Secondary | ICD-10-CM | POA: Diagnosis not present

## 2023-12-14 DIAGNOSIS — Z5111 Encounter for antineoplastic chemotherapy: Secondary | ICD-10-CM | POA: Diagnosis not present

## 2023-12-14 DIAGNOSIS — Z192 Hormone resistant malignancy status: Secondary | ICD-10-CM

## 2023-12-14 DIAGNOSIS — C778 Secondary and unspecified malignant neoplasm of lymph nodes of multiple regions: Secondary | ICD-10-CM | POA: Diagnosis not present

## 2023-12-14 DIAGNOSIS — Z5189 Encounter for other specified aftercare: Secondary | ICD-10-CM | POA: Diagnosis not present

## 2023-12-14 MED ORDER — PEGFILGRASTIM-FPGK 6 MG/0.6ML ~~LOC~~ SOSY
6.0000 mg | PREFILLED_SYRINGE | Freq: Once | SUBCUTANEOUS | Status: AC
  Administered 2023-12-14: 6 mg via SUBCUTANEOUS
  Filled 2023-12-14: qty 0.6

## 2023-12-15 ENCOUNTER — Other Ambulatory Visit: Payer: Self-pay

## 2023-12-20 ENCOUNTER — Other Ambulatory Visit: Payer: Self-pay | Admitting: Nurse Practitioner

## 2023-12-20 ENCOUNTER — Other Ambulatory Visit: Payer: Self-pay | Admitting: Hematology

## 2023-12-20 DIAGNOSIS — C778 Secondary and unspecified malignant neoplasm of lymph nodes of multiple regions: Secondary | ICD-10-CM

## 2023-12-20 DIAGNOSIS — M62830 Muscle spasm of back: Secondary | ICD-10-CM

## 2023-12-21 ENCOUNTER — Other Ambulatory Visit: Payer: Self-pay

## 2023-12-21 MED ORDER — CYCLOBENZAPRINE HCL 10 MG PO TABS
10.0000 mg | ORAL_TABLET | Freq: Three times a day (TID) | ORAL | 0 refills | Status: DC | PRN
  Filled 2023-12-21: qty 30, 10d supply, fill #0

## 2023-12-21 MED ORDER — GABAPENTIN 100 MG PO CAPS
100.0000 mg | ORAL_CAPSULE | Freq: Two times a day (BID) | ORAL | 0 refills | Status: DC | PRN
  Filled 2023-12-21: qty 120, 30d supply, fill #0

## 2023-12-22 ENCOUNTER — Other Ambulatory Visit: Payer: Self-pay

## 2023-12-28 ENCOUNTER — Encounter (HOSPITAL_COMMUNITY)
Admission: RE | Admit: 2023-12-28 | Discharge: 2023-12-28 | Disposition: A | Discharge status: Home / Self Care | Payer: Medicaid Other | Source: Ambulatory Visit

## 2023-12-28 DIAGNOSIS — C61 Malignant neoplasm of prostate: Secondary | ICD-10-CM | POA: Diagnosis not present

## 2023-12-28 DIAGNOSIS — C7951 Secondary malignant neoplasm of bone: Secondary | ICD-10-CM | POA: Diagnosis not present

## 2023-12-28 DIAGNOSIS — Z192 Hormone resistant malignancy status: Secondary | ICD-10-CM | POA: Insufficient documentation

## 2023-12-28 DIAGNOSIS — C775 Secondary and unspecified malignant neoplasm of intrapelvic lymph nodes: Secondary | ICD-10-CM | POA: Diagnosis not present

## 2023-12-28 MED ORDER — FLOTUFOLASTAT F 18 GALLIUM 296-5846 MBQ/ML IV SOLN
8.0000 | Freq: Once | INTRAVENOUS | Status: AC
  Administered 2023-12-28: 8.17 via INTRAVENOUS
  Filled 2023-12-28: qty 8

## 2023-12-29 NOTE — Assessment & Plan Note (Signed)
Will proceed with

## 2023-12-29 NOTE — Assessment & Plan Note (Signed)
 Recommend continue gabapentin

## 2023-12-29 NOTE — Progress Notes (Signed)
 Palliative Medicine [facility]  [phone] [phone]   Name: Craig Collins Date: 12/29/2023 MRN: [id number]  DOB: 02/16/1966  Patient Care Team: Oley Bascom RAMAN, NP as PCP - General (Pulmonary Disease) Vertell Pont, RN as Oncology Nurse Navigator Lanny Callander, MD as Consulting Physician (Hematology and Oncology)   INTERVAL HISTORY: Craig Collins is a 58 y.o. 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 Palliative ask to see for symptom and pain management and goals of care.   SOCIAL HISTORY:     reports that he has never smoked. He has never been exposed to tobacco smoke. He has never used smokeless tobacco. He reports that he does not currently use alcohol. He reports that he does not use drugs.  ADVANCE DIRECTIVES:  None on file  CODE STATUS: Full code  PAST MEDICAL HISTORY: Past Medical History:  Diagnosis Date   Benign prostate hyperplasia    Elevated PSA    Erectile dysfunction    Hypertension    Obesity (BMI 35.0-39.9 without comorbidity)    Prostate cancer (HCC) 05/2019   Sleep apnea    Urinary retention     ALLERGIES:  has no known allergies.  MEDICATIONS:  Current Outpatient Medications  Medication Sig Dispense Refill   albuterol  (VENTOLIN  HFA) 108 (90 Base) MCG/ACT inhaler Inhale 2 puffs into the lungs every 6 (six) hours as needed for wheezing or shortness of breath. 18 g 6   ascorbic acid (VITAMIN C) 500 MG tablet Take 500 mg by mouth daily.     aspirin EC 81 MG tablet Take 81 mg by mouth daily. (Patient not taking: Reported on 10/28/2023)     Calcium Carbonate-Vitamin D  (CALCIUM 500/D) 500-125 MG-UNIT TABS Take 1 tablet by mouth daily.     cyanocobalamin  1000 MCG tablet Take 2 tablets (2,000 mcg total) by mouth daily. 30 tablet 0   cyclobenzaprine  (FLEXERIL ) 10  MG tablet Take 1 tablet (10 mg total) by mouth 3 (three) times daily as needed for muscle spasms. 30 tablet 0   fluticasone  (FLONASE ) 50 MCG/ACT nasal spray Place 2 sprays into both nostrils daily. 16 g 6   gabapentin  (NEURONTIN ) 100 MG capsule Take 1-2 capsules (100-200 mg total) by mouth 2 (two) times daily as needed for neuropathy 120 capsule 0   hydrochlorothiazide  (HYDRODIURIL ) 25 MG tablet TAKE 1/2 TABLET (12.5 MG TOTAL) BY MOUTH DAILY. 45 tablet 3   lidocaine -prilocaine  (EMLA ) cream Apply to affected area once 30 g 3   lisinopril  (ZESTRIL ) 20 MG tablet Take 1 tablet (20 mg total) by mouth daily. 90 tablet 3   nystatin  cream (MYCOSTATIN ) Apply 1 Application topically 2 (two) times daily. 30 g 1   ondansetron  (ZOFRAN ) 8 MG tablet Take 1 tablet (8 mg total) by mouth every 8 (eight) hours as needed for nausea or vomiting. 30 tablet 1   predniSONE  (DELTASONE ) 5 MG tablet Take 1 tablet (5 mg total) by mouth in the morning and at bedtime. 60 tablet 11   prochlorperazine  (COMPAZINE ) 10 MG tablet Take 1 tablet (10 mg total) by mouth every 6 (six) hours as needed for nausea or vomiting. 30 tablet 1   sodium chloride  (OCEAN) 0.65 % SOLN nasal spray Place 1 spray into both nostrils as needed for congestion. 30 mL 0   tadalafil  (CIALIS ) 20 MG tablet Take 1 tablet (20 mg total) by mouth daily as  needed. 10 tablet 11   tamsulosin  (FLOMAX ) 0.4 MG CAPS capsule Take 1 capsule (0.4 mg total) by mouth daily. 30 capsule 3   traMADol  (ULTRAM ) 50 MG tablet Take 1 - 2 tablets (50 - 100 mg total) by mouth every 6 hours as needed for moderate pain or severe pain. 90 tablet 0   VIAGRA  25 MG tablet Take 1 tablet (25 mg total) by mouth daily as needed for erectile dysfunction. 10 tablet 0   No current facility-administered medications for this visit.    VITAL SIGNS: There were no vitals taken for this visit. There were no vitals filed for this visit.  Estimated body mass index is 34.81 kg/m as calculated from the  following:   Height as of 11/18/23: 5' 10 (1.778 m).   Weight as of 12/11/23: 242 lb 9.6 oz (110 kg).   PERFORMANCE STATUS (ECOG) : 1 - Symptomatic but completely ambulatory  Assessment NAD RRR AAO x4  IMPRESSION:  [name] presents to clinic for follow-up. No acute distress. Wife is present. Taking things one day at a time.   Karsen reports a period of increased pain last month, which required the use of tramadol . However, this has significantly improved and he is currently managing his pain with Tylenol  alone. The patient's chemotherapy dosage was recently reduced to help manage his neuropathy, but was subsequently increased due to concerns about cancer progression. Following this adjustment, the patient reports a significant reduction in pain.   The patient is also taking gabapentin  twice daily for neuropathy and prednisone  twice daily, along with his regular vitamins. He reports a good appetite, sometimes needing to control it, and sleeps well, although he sometimes feels he sleeps too much. He remains active and is able to do the things he wants to do.  The patient denies any issues with constipation or diarrhea, reporting regular bowel movements every morning. He also denies any vomiting or nausea. The patient expresses a positive outlook, attributing his well-being to his faith and the care he receives from his healthcare team.  3. Goals of Care  04/16/23-We discussed [name] current illness and what it means in the larger context of his on-going co-morbidities. Natural disease trajectory and expectations were discussed. We discussed the importance of continued conversation with family and their medical providers regarding overall plan of care and treatment options, ensuring decisions are within the context of the patients values and GOCs.   [name] relates that it has become more difficult for him to leave home, mostly due to the pain and fatigue. There are other factors  as well, such as the sweating that he experiences without warning. Patient reports this makes him feel blue. He is hopeful that an improvement in his pain will translate into an improvement in his energy levels.    Mr. Ribas relies on his faith as a source of strength in coping with his illness. He states that he does not want to claim having cancer and intentionally turns off the television when there is content on cancer or death. He is trying to enjoy each day of his life and not look at cancer as a death sentence.   I discussed the importance of continued conversation with family and their medical providers regarding overall plan of care and treatment options, ensuring decisions are within the context of the patients values and GOCs.  PLAN:  Cancer-related Neuropathy Improved with dose reduction of chemotherapy. Currently managed with gabapentin  twice daily. -Continue gabapentin  300mg  twice daily.  Cancer-related Pain Intermittent use of tramadol , currently well-managed with Tylenol . -Continue Tylenol  as needed for pain control. -Keep tramadol  available for breakthrough pain.  General Health Maintenance Good appetite, regular bowel movements, and no nausea or vomiting. Sleeps well but reports feeling like he sleeps too much at times due to treatment.  -Encourage continued activity to maintain energy levels. -I will plan to see patient back in 4-6 weeks. Sooner if needed. Patient expressed understanding and was in agreement with this plan. He also understands that He can call the clinic at any time with any questions, concerns, or complaints.   Any controlled substances utilized were prescribed in the context of palliative care. PDMP has been reviewed.   Visit consisted of counseling and education dealing with the complex and emotionally intense issues of symptom management and palliative care in the setting of serious and potentially life-threatening illness.  Levon Borer, AGPCNP-BC  Palliative Medicine Team/[facility]

## 2023-12-29 NOTE — Progress Notes (Signed)
 Patient Care Team: Craig Bascom RAMAN, NP as PCP - General (Pulmonary Disease) Craig Pont, RN as Oncology Nurse Navigator Craig Callander, MD as Consulting Physician (Hematology and Oncology)  Clinic Day:  12/31/2023  Referring physician: Lanny Callander, MD  ASSESSMENT & PLAN:   Assessment & Plan: 58 y.o. m. With history of HTN, OSA prostate cancer here for follow up.   Current diagnosis: mCRPC with bone metastases Initial diagnosis: GS 4+5 GG5 PSA 42 with bone metastases Germline testing: [employer] BRIP1 p. [id number] Variant, Likely Benign Somatic testing: negative for HRRm, neg for dMMR. TMB 1 Muts/Mb Previous treatment: ADT (at [facility]), ARPI (was given apalutamide  but never started, enza 06/2022-08/2023) PSA nadir 12/16/22 at 0.3   Current treatment: 08/05/23 - current: docetaxel . Completed 7 cycles at reduced dose.  His PET showed interval response.  Overall stable.  Will increase the dose slightly to 65 mg/m2.  He tolerated well. Will increase to 70 mg/m2  Monitor for toxicities with labs, and exam.  Metastatic castration-resistant adenocarcinoma of prostate (HCC) Will proceed with   Neuropathy Recommend continue gabapentin   Cancer related pain Okay to alternate tramadol  and Tylenol .  He knows the maximum dose limit. Follow-up with palliative care NP Craig Collins   Hypertension, essential On lisinopril  and HCTZ  At risk for side effect of medication He is getting ADT and will be getting Xgeva at [facility] Continue Vit D 2000 international units  and calcium 1200mg  daily Control BP and CV risk management   See me in 3 weeks after lab and then treatment. Continue every 3 weeks.  The patient understands the plans discussed today and is in agreement with them.  He knows to contact our office if he develops concerns prior to his next appointment.  [name], MD  [facility] - [facility] [address]  [address] [place] [address] Dept: [phone] Dept Fax: [phone]   Orders Placed This Encounter  Procedures   CBC with Differential (Cancer Center Only)    Standing Status:   Future    Expected Date:   03/24/2024    Expiration Date:   03/24/2025   PSA, total and free    Standing Status:   Future    Expected Date:   03/24/2024    Expiration Date:   03/24/2025   Testosterone     Standing Status:   Future    Expected Date:   03/24/2024    Expiration Date:   03/24/2025   CMP (Cancer Center only)    Standing Status:   Future    Expected Date:   03/24/2024    Expiration Date:   03/24/2025   PSA, total and free    Standing Status:   Future    Expected Date:   01/21/2024    Expiration Date:   01/20/2025   Testosterone     Standing Status:   Future    Expected Date:   01/21/2024    Expiration Date:   01/20/2025   PSA, total and free    Standing Status:   Future    Expected Date:   02/11/2024    Expiration Date:   02/10/2025   Testosterone     Standing Status:   Future    Expected Date:   02/11/2024    Expiration Date:   02/10/2025   CBC with Differential (Cancer Center Only)    Standing Status:   Future    Expected Date:  03/03/2024    Expiration Date:   03/03/2025   PSA, total and free    Standing Status:   Future    Expected Date:   03/03/2024    Expiration Date:   03/03/2025   Testosterone     Standing Status:   Future    Expected Date:   03/03/2024    Expiration Date:   03/03/2025   CMP (Cancer Center only)    Standing Status:   Future    Expected Date:   03/03/2024    Expiration Date:   03/03/2025      CHIEF COMPLAINT:  CC: mCRPC  Current Treatment:  docetaxel   INTERVAL HISTORY:  Craig Collins is here today for repeat clinical assessment. He denies fevers or chills. He reports improved left pain after last dose. He is taking Tylenol  about 2 a day. His appetite is good.  Neuropathy is intermittent and not worsening. Mainly in both hands and feet. He takes gabapentin  200 mg twice  daily.  I have reviewed the past medical history, past surgical history, social history and family history with the patient and they are unchanged from previous note.  ALLERGIES:  has no known allergies.  MEDICATIONS:  Current Outpatient Medications  Medication Sig Dispense Refill   albuterol  (VENTOLIN  HFA) 108 (90 Base) MCG/ACT inhaler Inhale 2 puffs into the lungs every 6 (six) hours as needed for wheezing or shortness of breath. 18 g 6   ascorbic acid (VITAMIN C) 500 MG tablet Take 500 mg by mouth daily.     Calcium Carbonate-Vitamin D  (CALCIUM 500/D) 500-125 MG-UNIT TABS Take 1 tablet by mouth daily.     cyanocobalamin  1000 MCG tablet Take 2 tablets (2,000 mcg total) by mouth daily. 30 tablet 0   cyclobenzaprine  (FLEXERIL ) 10 MG tablet Take 1 tablet (10 mg total) by mouth 3 (three) times daily as needed for muscle spasms. 30 tablet 0   fluticasone  (FLONASE ) 50 MCG/ACT nasal spray Place 2 sprays into both nostrils daily. 16 g 6   gabapentin  (NEURONTIN ) 100 MG capsule Take 1-2 capsules (100-200 mg total) by mouth 2 (two) times daily as needed for neuropathy 120 capsule 0   hydrochlorothiazide  (HYDRODIURIL ) 25 MG tablet TAKE 1/2 TABLET (12.5 MG TOTAL) BY MOUTH DAILY. 45 tablet 3   lidocaine -prilocaine  (EMLA ) cream Apply to affected area once 30 g 3   lisinopril  (ZESTRIL ) 20 MG tablet Take 1 tablet (20 mg total) by mouth daily. 90 tablet 3   nystatin  cream (MYCOSTATIN ) Apply 1 Application topically 2 (two) times daily. 30 g 1   ondansetron  (ZOFRAN ) 8 MG tablet Take 1 tablet (8 mg total) by mouth every 8 (eight) hours as needed for nausea or vomiting. 30 tablet 1   predniSONE  (DELTASONE ) 5 MG tablet Take 1 tablet (5 mg total) by mouth in the morning and at bedtime. 60 tablet 11   prochlorperazine  (COMPAZINE ) 10 MG tablet Take 1 tablet (10 mg total) by mouth every 6 (six) hours as needed for nausea or vomiting. 30 tablet 1   sodium chloride  (OCEAN) 0.65 % SOLN nasal spray Place 1 spray into both  nostrils as needed for congestion. 30 mL 0   tadalafil  (CIALIS ) 20 MG tablet Take 1 tablet (20 mg total) by mouth daily as needed. 10 tablet 11   tamsulosin  (FLOMAX ) 0.4 MG CAPS capsule Take 1 capsule (0.4 mg total) by mouth daily. 30 capsule 3   traMADol  (ULTRAM ) 50 MG tablet Take 1 - 2 tablets (50 - 100 mg total) by mouth every  6 hours as needed for moderate pain or severe pain. 90 tablet 0   VIAGRA  25 MG tablet Take 1 tablet (25 mg total) by mouth daily as needed for erectile dysfunction. 10 tablet 0   aspirin EC 81 MG tablet Take 81 mg by mouth daily. (Patient not taking: Reported on 12/31/2023)     No current facility-administered medications for this visit.    HISTORY OF PRESENT ILLNESS:   Oncology History Overview Note   Cancer Staging  Prostate cancer [facility]) Staging form: Prostate, AJCC 8th Edition - Clinical: Stage IVB (cTX, cNX, pM1b, PSA: 42, Grade Group: 5) - Signed by Amadeo Windell SAILOR, MD on 07/08/2022 Prostate specific antigen (PSA) range: 20 or greater Histologic grading system: 5 grade system     Prostate cancer (HCC) (Resolved)  12/21/2019 Initial Diagnosis   Prostate cancer (HCC)   07/08/2022 Cancer Staging   Staging form: Prostate, AJCC 8th Edition - Clinical: Stage IVB (cTX, cNX, pM1b, PSA: 42, Grade Group: 5) - Signed by Amadeo Windell SAILOR, MD on 07/08/2022 Prostate specific antigen (PSA) range: 20 or greater Histologic grading system: 5 grade system   Metastasis to bone (HCC) (Resolved)  07/08/2022 Initial Diagnosis   Metastasis to bone (HCC)   07/16/2023 PET scan    IMPRESSION: 1. Mixed response to therapy. 2. Interval decrease in size of the prostate gland with mild residual tracer uptake within the right posterior gland. 3. Interval decrease in size of thoracic and abdominal adenopathy. 4. Persistent, multifocal tracer avid bone metastases. Overall, the tracer avid lesions appear increased in size and degree of tracer uptake. Lesion within the right calvarium  has resolved in the interval.    Miscellaneous   FoundationONE      Metastatic castration-resistant adenocarcinoma of prostate (HCC)  07/08/2022 Initial Diagnosis   Malignant neoplasm of prostate metastatic to lymph nodes of multiple sites [facility])   08/05/2023 -  Chemotherapy   Patient is on Treatment Plan : PROSTATE Docetaxel  (75) + Prednisone  q21d     12/28/2023 PET scan   1. Partial response to therapy.  No disease progression. 2. Decreased size and radiotracer activity of RIGHT iliac and retroperitoneal adenopathy. 3. No change in number or pattern of metastatic skeletal disease; however, significant interval decrease in radiotracer activity and increased sclerosis. Lesions remain intensely radiotracer avid but again decreased in avidity 4. No evidence of visceral metastasis or pulmonary metastasis.       REVIEW OF SYSTEMS:   All relevant systems were reviewed with the patient and are negative.   VITALS:  Blood pressure (!) 133/94, pulse 89, temperature 98.8 F (37.1 C), temperature source Oral, resp. rate 18, weight 249 lb 9.6 oz (113.2 kg), SpO2 100%.  Wt Readings from Last 3 Encounters:  12/31/23 249 lb 9.6 oz (113.2 kg)  12/11/23 242 lb 9.6 oz (110 kg)  11/18/23 252 lb 1.6 oz (114.4 kg)    Body mass index is 35.81 kg/m.  Performance status (ECOG): 1 - Symptomatic but completely ambulatory  PHYSICAL EXAM:   GENERAL:alert, no distress and comfortable SKIN: skin color normal, no rashes  EYES: normal, sclera clear OROPHARYNX: no exudate, no erythema    NECK: supple,  non-tender, without nodularity LYMPH:  no palpable cervical lymphadenopathy LUNGS: clear to auscultation with normal breathing effort.  No wheeze or rales HEART: regular rate & rhythm and no murmurs and no lower extremity edema ABDOMEN: abdomen soft, non-tender and nondistended Musculoskeletal: no edema NEURO: alert, fluent speech, no focal motor/sensory deficits.  Strength and  sensation equal  bilaterally.  LABORATORY DATA:  I have reviewed the data as listed    Component Value Date/Time   NA 139 12/31/2023 1021   NA 142 10/22/2022 1548   K 3.7 12/31/2023 1021   CL 106 12/31/2023 1021   CO2 28 12/31/2023 1021   GLUCOSE 92 12/31/2023 1021   BUN 19 12/31/2023 1021   BUN 19 10/22/2022 1548   CREATININE 1.04 12/31/2023 1021   CREATININE 1.39 (H) 10/28/2017 0958   CALCIUM 9.5 12/31/2023 1021   PROT 7.2 12/31/2023 1021   PROT 6.6 10/22/2022 1548   ALBUMIN 4.0 12/31/2023 1021   ALBUMIN 3.9 10/22/2022 1548   AST 9 (L) 12/31/2023 1021   ALT 8 12/31/2023 1021   ALKPHOS 259 (H) 12/31/2023 1021   BILITOT 0.3 12/31/2023 1021   GFRNONAA >60 12/31/2023 1021   GFRAA 65 06/18/2020 0921    No results found for: SPEP, UPEP  Lab Results  Component Value Date   WBC 6.3 12/31/2023   NEUTROABS 4.5 12/31/2023   HGB 10.3 (L) 12/31/2023   HCT 31.9 (L) 12/31/2023   MCV 93.3 12/31/2023   PLT 284 12/31/2023      Chemistry      Component Value Date/Time   NA 139 12/31/2023 1021   NA 142 10/22/2022 1548   K 3.7 12/31/2023 1021   CL 106 12/31/2023 1021   CO2 28 12/31/2023 1021   BUN 19 12/31/2023 1021   BUN 19 10/22/2022 1548   CREATININE 1.04 12/31/2023 1021   CREATININE 1.39 (H) 10/28/2017 0958      Component Value Date/Time   CALCIUM 9.5 12/31/2023 1021   ALKPHOS 259 (H) 12/31/2023 1021   AST 9 (L) 12/31/2023 1021   ALT 8 12/31/2023 1021   BILITOT 0.3 12/31/2023 1021       RADIOGRAPHIC STUDIES: I have personally reviewed the radiological images as listed and agreed with the findings in the report. NM PET (PSMA) SKULL TO MID THIGH Result Date: 12/30/2023 CLINICAL DATA:  Prostate carcinoma with metastatic adenopathy and metastatic skeletal disease. Assess treatment response. EXAM: NUCLEAR MEDICINE PET SKULL BASE TO THIGH TECHNIQUE: 8.2 mCi Flotufolastat (Posluma ) was injected intravenously. Full-ring PET imaging was performed from the skull base to thigh after the  radiotracer. CT data was obtained and used for attenuation correction and anatomic localization. COMPARISON:  PSMA PET scan 07/16/2023 FINDINGS: NECK LEFT supraclavicular nodes without radiotracer activity. Incidental CT finding: None. CHEST No radiotracer accumulation within mediastinal or hilar lymph nodes. No suspicious pulmonary nodules on the CT scan. Incidental CT finding: None. ABDOMEN/PELVIS Prostate: No focal activity in the prostate gland. Lymph nodes: Again demonstrated RIGHT iliac and periaortic retroperitoneal intensely hypermetabolic lymph nodes. For example: Bulky intensely radiotracer avid iliac adenopathy. RIGHT common iliac lymph node measures 17 mm compared to 26 mm with SUV max equal 51 compared SUV max equal 70 (image 162). Hypermetabolic retroperitoneal periaortic nodes noted. For example lymph node RIGHT of the aorta at the bifurcation measures 14 mm (image 144) compares to 19 mm with SUV max equal 32.8 compared SUV max equal 71.1. Retrocrural node also decreased in size and radiotracer activity Liver: No evidence of liver metastasis. Incidental CT finding: None. SKELETON Again demonstrated multifocal intense radiotracer avid skeletal metastasis. No change in number or pattern of metastatic skeletal disease. For example broad lesion in the posterior LEFT iliac bone with SUV max equal 55.8 (image 154) compares to SUV max equal 85.2. Broad lesion in the RIGHT iliac wing with SUV max  equal 39 compared SUV max equal 80 (image [id number]. Lesion at L2 with SUV max equal 39 compared SUV max equal 101 (image 123). Increased sclerosis of this lesion which now measures 28 mm on CT (image 122/series 4 compares to more subtle less stents 17 mm lesion on prior. Likewise the lesions in the pelvis are increased in sclerotic density. IMPRESSION: 1. Partial response to therapy.  No disease progression. 2. Decreased size and radiotracer activity of RIGHT iliac and retroperitoneal adenopathy. 3. No change in number  or pattern of metastatic skeletal disease; however, significant interval decrease in radiotracer activity and increased sclerosis. Lesions remain intensely radiotracer avid but again decreased in avidity 4. No evidence of visceral metastasis or pulmonary metastasis. Electronically Signed   By: Jackquline Boxer M.D.   On: 12/30/2023 12:19

## 2023-12-30 ENCOUNTER — Telehealth: Payer: Self-pay

## 2023-12-30 NOTE — Telephone Encounter (Signed)
 Per secured chat  duet to weather patient was reschedule for Friday 1/10 @315 ... Complete and confirmed.

## 2023-12-31 ENCOUNTER — Inpatient Hospital Stay (HOSPITAL_BASED_OUTPATIENT_CLINIC_OR_DEPARTMENT_OTHER): Payer: Medicaid Other | Attending: Nurse Practitioner

## 2023-12-31 ENCOUNTER — Other Ambulatory Visit: Payer: Self-pay

## 2023-12-31 ENCOUNTER — Inpatient Hospital Stay (HOSPITAL_BASED_OUTPATIENT_CLINIC_OR_DEPARTMENT_OTHER): Payer: Medicaid Other | Admitting: Nurse Practitioner

## 2023-12-31 ENCOUNTER — Inpatient Hospital Stay: Payer: Medicaid Other | Attending: Nurse Practitioner

## 2023-12-31 ENCOUNTER — Inpatient Hospital Stay: Payer: Medicaid Other

## 2023-12-31 VITALS — BP 133/94 | HR 89 | Temp 98.8°F | Resp 18 | Wt 249.6 lb

## 2023-12-31 DIAGNOSIS — Z95828 Presence of other vascular implants and grafts: Secondary | ICD-10-CM

## 2023-12-31 DIAGNOSIS — C61 Malignant neoplasm of prostate: Secondary | ICD-10-CM

## 2023-12-31 DIAGNOSIS — I10 Essential (primary) hypertension: Secondary | ICD-10-CM | POA: Diagnosis not present

## 2023-12-31 DIAGNOSIS — R530 Neoplastic (malignant) related fatigue: Secondary | ICD-10-CM | POA: Diagnosis not present

## 2023-12-31 DIAGNOSIS — Z192 Hormone resistant malignancy status: Secondary | ICD-10-CM | POA: Diagnosis not present

## 2023-12-31 DIAGNOSIS — G629 Polyneuropathy, unspecified: Secondary | ICD-10-CM

## 2023-12-31 DIAGNOSIS — Z9189 Other specified personal risk factors, not elsewhere classified: Secondary | ICD-10-CM | POA: Insufficient documentation

## 2023-12-31 DIAGNOSIS — G893 Neoplasm related pain (acute) (chronic): Secondary | ICD-10-CM | POA: Diagnosis not present

## 2023-12-31 DIAGNOSIS — Z5189 Encounter for other specified aftercare: Secondary | ICD-10-CM | POA: Diagnosis not present

## 2023-12-31 DIAGNOSIS — Z5111 Encounter for antineoplastic chemotherapy: Secondary | ICD-10-CM | POA: Insufficient documentation

## 2023-12-31 DIAGNOSIS — M792 Neuralgia and neuritis, unspecified: Secondary | ICD-10-CM | POA: Diagnosis not present

## 2023-12-31 DIAGNOSIS — C7951 Secondary malignant neoplasm of bone: Secondary | ICD-10-CM | POA: Diagnosis not present

## 2023-12-31 DIAGNOSIS — Z515 Encounter for palliative care: Secondary | ICD-10-CM | POA: Diagnosis not present

## 2023-12-31 LAB — CBC WITH DIFFERENTIAL (CANCER CENTER ONLY)
Abs Immature Granulocytes: 0.02 10*3/uL (ref 0.00–0.07)
Basophils Absolute: 0 10*3/uL (ref 0.0–0.1)
Basophils Relative: 0 %
Eosinophils Absolute: 0 10*3/uL (ref 0.0–0.5)
Eosinophils Relative: 0 %
HCT: 31.9 % — ABNORMAL LOW (ref 39.0–52.0)
Hemoglobin: 10.3 g/dL — ABNORMAL LOW (ref 13.0–17.0)
Immature Granulocytes: 0 %
Lymphocytes Relative: 17 %
Lymphs Abs: 1.1 10*3/uL (ref 0.7–4.0)
MCH: 30.1 pg (ref 26.0–34.0)
MCHC: 32.3 g/dL (ref 30.0–36.0)
MCV: 93.3 fL (ref 80.0–100.0)
Monocytes Absolute: 0.8 10*3/uL (ref 0.1–1.0)
Monocytes Relative: 12 %
Neutro Abs: 4.5 10*3/uL (ref 1.7–7.7)
Neutrophils Relative %: 71 %
Platelet Count: 284 10*3/uL (ref 150–400)
RBC: 3.42 MIL/uL — ABNORMAL LOW (ref 4.22–5.81)
RDW: 15.3 % (ref 11.5–15.5)
WBC Count: 6.3 10*3/uL (ref 4.0–10.5)
nRBC: 0 % (ref 0.0–0.2)

## 2023-12-31 LAB — CMP (CANCER CENTER ONLY)
ALT: 8 U/L (ref 0–44)
AST: 9 U/L — ABNORMAL LOW (ref 15–41)
Albumin: 4 g/dL (ref 3.5–5.0)
Alkaline Phosphatase: 259 U/L — ABNORMAL HIGH (ref 38–126)
Anion gap: 5 (ref 5–15)
BUN: 19 mg/dL (ref 6–20)
CO2: 28 mmol/L (ref 22–32)
Calcium: 9.5 mg/dL (ref 8.9–10.3)
Chloride: 106 mmol/L (ref 98–111)
Creatinine: 1.04 mg/dL (ref 0.61–1.24)
GFR, Estimated: >60 mL/min (ref >60)
Glucose, Bld: 92 mg/dL (ref 70–99)
Potassium: 3.7 mmol/L (ref 3.5–5.1)
Sodium: 139 mmol/L (ref 135–145)
Total Bilirubin: 0.3 mg/dL (ref 0.0–1.2)
Total Protein: 7.2 g/dL (ref 6.5–8.1)

## 2023-12-31 MED ORDER — SODIUM CHLORIDE 0.9% FLUSH
10.0000 mL | Freq: Once | INTRAVENOUS | Status: AC
  Administered 2023-12-31: 10 mL

## 2023-12-31 MED ORDER — HEPARIN SOD (PORK) LOCK FLUSH 100 UNIT/ML IV SOLN
500.0000 [IU] | Freq: Once | INTRAVENOUS | Status: AC | PRN
  Administered 2023-12-31: 500 [IU]

## 2023-12-31 MED ORDER — DEXAMETHASONE SODIUM PHOSPHATE 10 MG/ML IJ SOLN
10.0000 mg | Freq: Once | INTRAMUSCULAR | Status: AC
  Administered 2023-12-31: 10 mg via INTRAVENOUS
  Filled 2023-12-31: qty 1

## 2023-12-31 MED ORDER — SODIUM CHLORIDE 0.9 % IV SOLN
165.0000 mg | Freq: Once | INTRAVENOUS | Status: AC
  Administered 2023-12-31: 165 mg via INTRAVENOUS
  Filled 2023-12-31: qty 16.5

## 2023-12-31 MED ORDER — SODIUM CHLORIDE 0.9 % IV SOLN: Freq: Once | INTRAVENOUS | Status: AC

## 2023-12-31 MED ORDER — SODIUM CHLORIDE 0.9% FLUSH
10.0000 mL | INTRAVENOUS | Status: DC | PRN
  Administered 2023-12-31: 10 mL

## 2023-12-31 NOTE — Assessment & Plan Note (Signed)
 Okay to alternate tramadol and Tylenol.  He knows the maximum dose limit. Follow-up with palliative care NP [facility]

## 2023-12-31 NOTE — Assessment & Plan Note (Signed)
 On lisinopril and HCTZ

## 2023-12-31 NOTE — Assessment & Plan Note (Signed)
 He is getting ADT and will be getting Xgeva at [facility] Continue Vit D 2000 international units  and calcium 1200mg  daily Control BP and CV risk management

## 2023-12-31 NOTE — Patient Instructions (Signed)
 [facility] - A DEPT OF [facility]  Discharge Instructions: Thank you for choosing [facility] to provide your oncology and hematology care.   If you have a lab appointment with the Cancer Center, please go directly to the Cancer Center and check in at the registration area.   Wear comfortable clothing and clothing appropriate for easy access to any Portacath or PICC line.   We strive to give you quality time with your provider. You may need to reschedule your appointment if you arrive late (15 or more minutes).  Arriving late affects you and other patients whose appointments are after yours.  Also, if you miss three or more appointments without notifying the office, you may be dismissed from the clinic at the provider's discretion.      For prescription refill requests, have your pharmacy contact our office and allow 72 hours for refills to be completed.    Today you received the following chemotherapy and/or immunotherapy agents Taxotere.      To help prevent nausea and vomiting after your treatment, we encourage you to take your nausea medication as directed.  BELOW ARE SYMPTOMS THAT SHOULD BE REPORTED IMMEDIATELY: *FEVER GREATER THAN 100.4 F (38 C) OR HIGHER *CHILLS OR SWEATING *NAUSEA AND VOMITING THAT IS NOT CONTROLLED WITH YOUR NAUSEA MEDICATION *UNUSUAL SHORTNESS OF BREATH *UNUSUAL BRUISING OR BLEEDING *URINARY PROBLEMS (pain or burning when urinating, or frequent urination) *BOWEL PROBLEMS (unusual diarrhea, constipation, pain near the anus) TENDERNESS IN MOUTH AND THROAT WITH OR WITHOUT PRESENCE OF ULCERS (sore throat, sores in mouth, or a toothache) UNUSUAL RASH, SWELLING OR PAIN  UNUSUAL VAGINAL DISCHARGE OR ITCHING   Items with * indicate a potential emergency and should be followed up as soon as possible or go to the Emergency Department if any problems should occur.  Please show the CHEMOTHERAPY ALERT CARD or IMMUNOTHERAPY  ALERT CARD at check-in to the Emergency Department and triage nurse.  Should you have questions after your visit or need to cancel or reschedule your appointment, please contact [facility] - A DEPT OF [facility]  Dept: [phone]  and follow the prompts.  Office hours are 8:00 a.m. to 4:30 p.m. Monday - Friday. Please note that voicemails left after 4:00 p.m. may not be returned until the following business day.  We are closed weekends and major holidays. You have access to a nurse at all times for urgent questions. Please call the main number to the clinic Dept: [phone] and follow the prompts.   For any non-urgent questions, you may also contact your provider using MyChart. We now offer e-Visits for anyone 69 and older to request care online for non-urgent symptoms. For details visit [email or web address].   Also download the MyChart app! Go to the app store, search "MyChart", open the app, select [facility], and log in with your MyChart username and password.

## 2024-01-01 ENCOUNTER — Inpatient Hospital Stay: Payer: Medicaid Other

## 2024-01-01 ENCOUNTER — Encounter: Payer: Self-pay | Admitting: Hematology

## 2024-01-01 VITALS — BP 134/92 | HR 87 | Temp 99.2°F | Resp 18

## 2024-01-01 DIAGNOSIS — Z192 Hormone resistant malignancy status: Secondary | ICD-10-CM

## 2024-01-01 DIAGNOSIS — Z5111 Encounter for antineoplastic chemotherapy: Secondary | ICD-10-CM | POA: Diagnosis not present

## 2024-01-01 DIAGNOSIS — C61 Malignant neoplasm of prostate: Secondary | ICD-10-CM

## 2024-01-01 DIAGNOSIS — Z5189 Encounter for other specified aftercare: Secondary | ICD-10-CM | POA: Diagnosis not present

## 2024-01-01 DIAGNOSIS — C7951 Secondary malignant neoplasm of bone: Secondary | ICD-10-CM | POA: Diagnosis not present

## 2024-01-01 MED ORDER — PEGFILGRASTIM-CBQV 6 MG/0.6ML ~~LOC~~ SOSY
6.0000 mg | PREFILLED_SYRINGE | Freq: Once | SUBCUTANEOUS | Status: AC
  Administered 2024-01-01: 6 mg via SUBCUTANEOUS
  Filled 2024-01-01: qty 0.6

## 2024-01-02 ENCOUNTER — Encounter: Payer: Self-pay | Admitting: Nurse Practitioner

## 2024-01-02 ENCOUNTER — Encounter: Payer: Self-pay | Admitting: Hematology

## 2024-01-02 ENCOUNTER — Ambulatory Visit: Payer: Medicaid Other

## 2024-01-02 LAB — TESTOSTERONE: Testosterone: <3 ng/dL — ABNORMAL LOW (ref 264–916)

## 2024-01-02 LAB — PSA, TOTAL AND FREE
PSA, Free Pct: 23.3 %
PSA, Free: 3.54 ng/mL
Prostate Specific Ag, Serum: 15.2 ng/mL — ABNORMAL HIGH (ref 0.0–4.0)

## 2024-01-06 DIAGNOSIS — C61 Malignant neoplasm of prostate: Secondary | ICD-10-CM | POA: Diagnosis not present

## 2024-01-06 DIAGNOSIS — C7951 Secondary malignant neoplasm of bone: Secondary | ICD-10-CM | POA: Diagnosis not present

## 2024-01-06 DIAGNOSIS — C778 Secondary and unspecified malignant neoplasm of lymph nodes of multiple regions: Secondary | ICD-10-CM | POA: Diagnosis not present

## 2024-01-19 ENCOUNTER — Other Ambulatory Visit: Payer: Self-pay

## 2024-01-19 ENCOUNTER — Other Ambulatory Visit: Payer: Self-pay | Admitting: Nurse Practitioner

## 2024-01-19 DIAGNOSIS — J019 Acute sinusitis, unspecified: Secondary | ICD-10-CM

## 2024-01-19 NOTE — Progress Notes (Unsigned)
Patient Care Team: Ivonne Andrew, NP as PCP - General (Pulmonary Disease) Cherlyn Cushing, RN as Oncology Nurse Navigator Pickenpack-Cousar, Arty Baumgartner, NP as Nurse Practitioner [facility] and Palliative Medicine) Malachy Mood, MD as Consulting Physician (Hematology and Oncology)  Clinic Day:  01/21/2024  Referring physician: Malachy Mood, MD  ASSESSMENT & PLAN:   Assessment & Plan: 58 y.o. m. With history of HTN, OSA prostate cancer here for follow up.   Current diagnosis: mCRPC with bone metastases Initial diagnosis: GS 4+5 GG5 PSA 42 with bone metastases Germline testing: [employer] BRIP1 p. [id number] Variant, Likely Benign Somatic testing: negative for HRRm, neg for dMMR. TMB 1 Muts/Mb Previous treatment: ADT (at [facility]), ARPI (was given apalutamide but never started, enza 06/2022-08/2023) PSA nadir 12/16/22 at 0.3   Current treatment: 08/05/23 - current: docetaxel. Completed 7 cycles at reduced dose.   His PET showed interval response.  Overall stable.   Improvement on treatment duration.  Will continue docetaxel dose increased to 70 mg/m2.    Monitor for toxicities with labs, and exam.  Metastatic castration-resistant adenocarcinoma of prostate (HCC) Continue docetaxel 70mg /m2 Monitor toxicities  Neuropathy Recommend continue gabapentin 200 mg twice daily  At risk for side effect of medication He is getting ADT and will be getting Xgeva at [facility] in March Continue Vit D 2000 international units  and calcium 1200mg  daily Control BP and CV risk management  Normocytic anemia Adding b12, folate and ferritin  Hypertension, essential lisinopril and HCTZ    The patient understands the plans discussed today and is in agreement with them.  He knows to contact our office if he develops concerns prior to his next appointment.  Melven Sartorius, MD  [facility] - A DEPT OF [facility] [address] [place] [address] Dept: [phone] Dept Fax: [phone]   Orders Placed This Encounter  Procedures   Prostate-Specific AG, Serum    Standing Status:   Future    Expected Date:   01/21/2024    Expiration Date:   01/20/2025   Prostate-Specific AG, Serum    Standing Status:   Future    Expected Date:   02/11/2024    Expiration Date:   02/10/2025   Prostate-Specific AG, Serum    Standing Status:   Future    Expected Date:   03/03/2024    Expiration Date:   03/03/2025   Prostate-Specific AG, Serum    Standing Status:   Future    Expected Date:   03/24/2024    Expiration Date:   03/24/2025   Folate    Standing Status:   Future    Number of Occurrences:   1    Expected Date:   01/21/2024    Expiration Date:   01/20/2025   Ferritin    Standing Status:   Future    Number of Occurrences:   1    Expected Date:   01/21/2024    Expiration Date:   01/20/2025   Vitamin B12    Standing Status:   Future    Number of Occurrences:   1    Expected Date:   01/21/2024    Expiration Date:   01/20/2025      CHIEF COMPLAINT:  CC: mCRPC  Current Treatment: Docetaxel  INTERVAL HISTORY:  Ziquan is here today for repeat clinical assessment. He denies fevers or chills. He report of left side hip pain started about 2 to  3 days ago.  This time a chemotherapy event lasted longer without pain returning earlier.  He takes Tylenol as needed.  He has not needed tramadol.  Neuropathy has been intermittent without worsening.  No chest pain, shortness of breath, coughing.  His appetite is good.  Reports some abdominal pain, and passing gas.  Reported normal stool without constipation or diarrhea.  He is taking gabapentin 200 mg twice daily.  I have reviewed the past medical history, past surgical history, social history and family history with the patient and they are unchanged from previous note.  ALLERGIES:  has no known allergies.  MEDICATIONS:  Current Outpatient Medications  Medication Sig  Dispense Refill   albuterol (VENTOLIN HFA) 108 (90 Base) MCG/ACT inhaler Inhale 2 puffs into the lungs every 6 (six) hours as needed for wheezing or shortness of breath. 18 g 6   ascorbic acid (VITAMIN C) 500 MG tablet Take 500 mg by mouth daily.     aspirin EC 81 MG tablet Take 81 mg by mouth daily. (Patient not taking: Reported on 12/31/2023)     Calcium Carbonate-Vitamin D (CALCIUM 500/D) 500-125 MG-UNIT TABS Take 1 tablet by mouth daily.     cyanocobalamin 1000 MCG tablet Take 2 tablets (2,000 mcg total) by mouth daily. 30 tablet 0   cyclobenzaprine (FLEXERIL) 10 MG tablet Take 1 tablet (10 mg total) by mouth 3 (three) times daily as needed for muscle spasms. 30 tablet 0   fluticasone (FLONASE) 50 MCG/ACT nasal spray Place 2 sprays into both nostrils daily. 16 g 0   gabapentin (NEURONTIN) 100 MG capsule Take 1-2 capsules (100-200 mg total) by mouth 2 (two) times daily as needed for neuropathy 120 capsule 0   hydrochlorothiazide (HYDRODIURIL) 25 MG tablet TAKE 1/2 TABLET (12.5 MG TOTAL) BY MOUTH DAILY. 45 tablet 3   lidocaine-prilocaine (EMLA) cream Apply to affected area once 30 g 3   lisinopril (ZESTRIL) 20 MG tablet Take 1 tablet (20 mg total) by mouth daily. 90 tablet 3   nystatin cream (MYCOSTATIN) Apply 1 Application topically 2 (two) times daily. 30 g 1   ondansetron (ZOFRAN) 8 MG tablet Take 1 tablet (8 mg total) by mouth every 8 (eight) hours as needed for nausea or vomiting. 30 tablet 1   predniSONE (DELTASONE) 5 MG tablet Take 1 tablet (5 mg total) by mouth in the morning and at bedtime. 60 tablet 11   prochlorperazine (COMPAZINE) 10 MG tablet Take 1 tablet (10 mg total) by mouth every 6 (six) hours as needed for nausea or vomiting. 30 tablet 1   sodium chloride (OCEAN) 0.65 % SOLN nasal spray Place 1 spray into both nostrils as needed for congestion. 30 mL 0   tadalafil (CIALIS) 20 MG tablet Take 1 tablet (20 mg total) by mouth daily as needed. 10 tablet 11   tamsulosin (FLOMAX) 0.4 MG  CAPS capsule Take 1 capsule (0.4 mg total) by mouth daily. 30 capsule 3   traMADol (ULTRAM) 50 MG tablet Take 1 - 2 tablets (50 - 100 mg total) by mouth every 6 hours as needed for moderate pain or severe pain. 90 tablet 0   VIAGRA 25 MG tablet Take 1 tablet (25 mg total) by mouth daily as needed for erectile dysfunction. 10 tablet 0   No current facility-administered medications for this visit.   Facility-Administered Medications Ordered in Other Visits  Medication Dose Route Frequency Provider Last Rate Last Admin   dexamethasone (DECADRON) injection 10 mg  10 mg Intravenous Once  Malachy Mood, MD       DOCEtaxel (TAXOTERE) 160 mg in sodium chloride 0.9 % 250 mL chemo infusion  70 mg/m2 (Treatment Plan Recorded) Intravenous Once Melven Sartorius, MD       heparin lock flush 100 unit/mL  500 Units Intracatheter Once PRN Malachy Mood, MD       sodium chloride flush (NS) 0.9 % injection 10 mL  10 mL Intracatheter PRN Malachy Mood, MD        HISTORY OF PRESENT ILLNESS:   Oncology History Overview Note   Cancer Staging  Prostate cancer [facility]) Staging form: Prostate, AJCC 8th Edition - Clinical: Stage IVB (cTX, cNX, pM1b, PSA: 42, Grade Group: 5) - Signed by Benjiman Core, MD on 07/08/2022 Prostate specific antigen (PSA) range: 20 or greater Histologic grading system: 5 grade system     Prostate cancer (HCC) (Resolved)  12/21/2019 Initial Diagnosis   Prostate cancer (HCC)   07/08/2022 Cancer Staging   Staging form: Prostate, AJCC 8th Edition - Clinical: Stage IVB (cTX, cNX, pM1b, PSA: 42, Grade Group: 5) - Signed by Benjiman Core, MD on 07/08/2022 Prostate specific antigen (PSA) range: 20 or greater Histologic grading system: 5 grade system   Metastasis to bone (HCC) (Resolved)  07/08/2022 Initial Diagnosis   Metastasis to bone (HCC)   07/16/2023 PET scan    IMPRESSION: 1. Mixed response to therapy. 2. Interval decrease in size of the prostate gland with mild residual tracer uptake within  the right posterior gland. 3. Interval decrease in size of thoracic and abdominal adenopathy. 4. Persistent, multifocal tracer avid bone metastases. Overall, the tracer avid lesions appear increased in size and degree of tracer uptake. Lesion within the right calvarium has resolved in the interval.    Miscellaneous   FoundationONE      Metastatic castration-resistant adenocarcinoma of prostate (HCC)  07/08/2022 Initial Diagnosis   Malignant neoplasm of prostate metastatic to lymph nodes of multiple sites [facility])   08/05/2023 -  Chemotherapy   Patient is on Treatment Plan : PROSTATE Docetaxel (75) + Prednisone q21d     12/28/2023 PET scan   1. Partial response to therapy.  No disease progression. 2. Decreased size and radiotracer activity of RIGHT iliac and retroperitoneal adenopathy. 3. No change in number or pattern of metastatic skeletal disease; however, significant interval decrease in radiotracer activity and increased sclerosis. Lesions remain intensely radiotracer avid but again decreased in avidity 4. No evidence of visceral metastasis or pulmonary metastasis.   01/20/2024 Cancer Staging   Staging form: Prostate, AJCC 8th Edition - Clinical: Stage IVB (cTX, cN1, pM1b) - Signed by Melven Sartorius, MD on 01/20/2024       REVIEW OF SYSTEMS:   All relevant systems were reviewed with the patient and are negative.   VITALS:  Blood pressure 122/83, pulse (!) 107, temperature 97.7 F (36.5 C), temperature source Temporal, resp. rate 18, weight 246 lb 4.8 oz (111.7 kg), SpO2 99%.  Wt Readings from Last 3 Encounters:  01/21/24 246 lb 4.8 oz (111.7 kg)  12/31/23 249 lb 9.6 oz (113.2 kg)  12/11/23 242 lb 9.6 oz (110 kg)    Body mass index is 35.34 kg/m.  Performance status (ECOG): 1 - Symptomatic but completely ambulatory  PHYSICAL EXAM:   GENERAL:alert, no distress and comfortable SKIN: skin color normal EYES: normal, sclera clear OROPHARYNX: no exudate, no erythema     NECK: supple,  non-tender, without nodularity LYMPH:  no palpable cervical lymphadenopathy LUNGS: clear to auscultation  with normal breathing effort.  No wheeze or rales HEART: regular rate & rhythm and no murmurs and no lower extremity edema ABDOMEN: abdomen soft, non-tender and nondistended Musculoskeletal: no edema   LABORATORY DATA:  I have reviewed the data as listed    Component Value Date/Time   NA 138 01/21/2024 1145   NA 142 10/22/2022 1548   K 3.7 01/21/2024 1145   CL 105 01/21/2024 1145   CO2 25 01/21/2024 1145   GLUCOSE 97 01/21/2024 1145   BUN 31 (H) 01/21/2024 1145   BUN 19 10/22/2022 1548   CREATININE 1.26 (H) 01/21/2024 1145   CREATININE 1.39 (H) 10/28/2017 0958   CALCIUM 9.7 01/21/2024 1145   PROT 7.5 01/21/2024 1145   PROT 6.6 10/22/2022 1548   ALBUMIN 4.1 01/21/2024 1145   ALBUMIN 3.9 10/22/2022 1548   AST 12 (L) 01/21/2024 1145   ALT 8 01/21/2024 1145   ALKPHOS 239 (H) 01/21/2024 1145   BILITOT 0.4 01/21/2024 1145   GFRNONAA >60 01/21/2024 1145   GFRAA 65 06/18/2020 0921    No results found for: "SPEP", "UPEP"  Lab Results  Component Value Date   WBC 6.5 01/21/2024   NEUTROABS 4.5 01/21/2024   HGB 10.5 (L) 01/21/2024   HCT 32.8 (L) 01/21/2024   MCV 93.2 01/21/2024   PLT 256 01/21/2024      Chemistry      Component Value Date/Time   NA 138 01/21/2024 1145   NA 142 10/22/2022 1548   K 3.7 01/21/2024 1145   CL 105 01/21/2024 1145   CO2 25 01/21/2024 1145   BUN 31 (H) 01/21/2024 1145   BUN 19 10/22/2022 1548   CREATININE 1.26 (H) 01/21/2024 1145   CREATININE 1.39 (H) 10/28/2017 0958      Component Value Date/Time   CALCIUM 9.7 01/21/2024 1145   ALKPHOS 239 (H) 01/21/2024 1145   AST 12 (L) 01/21/2024 1145   ALT 8 01/21/2024 1145   BILITOT 0.4 01/21/2024 1145       RADIOGRAPHIC STUDIES: I have personally reviewed the radiological images as listed and agreed with the findings in the report. NM PET (PSMA) SKULL TO MID THIGH Result  Date: 12/30/2023 CLINICAL DATA:  Prostate carcinoma with metastatic adenopathy and metastatic skeletal disease. Assess treatment response. EXAM: NUCLEAR MEDICINE PET SKULL BASE TO THIGH TECHNIQUE: 8.2 mCi Flotufolastat (Posluma) was injected intravenously. Full-ring PET imaging was performed from the skull base to thigh after the radiotracer. CT data was obtained and used for attenuation correction and anatomic localization. COMPARISON:  PSMA PET scan 07/16/2023 FINDINGS: NECK LEFT supraclavicular nodes without radiotracer activity. Incidental CT finding: None. CHEST No radiotracer accumulation within mediastinal or hilar lymph nodes. No suspicious pulmonary nodules on the CT scan. Incidental CT finding: None. ABDOMEN/PELVIS Prostate: No focal activity in the prostate gland. Lymph nodes: Again demonstrated RIGHT iliac and periaortic retroperitoneal intensely hypermetabolic lymph nodes. For example: Bulky intensely radiotracer avid iliac adenopathy. RIGHT common iliac lymph node measures 17 mm compared to 26 mm with SUV max equal 51 compared SUV max equal 70 (image 162). Hypermetabolic retroperitoneal periaortic nodes noted. For example lymph node RIGHT of the aorta at the bifurcation measures 14 mm (image 144) compares to 19 mm with SUV max equal 32.8 compared SUV max equal 71.1. Retrocrural node also decreased in size and radiotracer activity Liver: No evidence of liver metastasis. Incidental CT finding: None. SKELETON Again demonstrated multifocal intense radiotracer avid skeletal metastasis. No change in number or pattern of metastatic skeletal disease. For  example broad lesion in the posterior LEFT iliac bone with SUV max equal 55.8 (image 154) compares to SUV max equal 85.2. Broad lesion in the RIGHT iliac wing with SUV max equal 39 compared SUV max equal 80 (image [id number]. Lesion at L2 with SUV max equal 39 compared SUV max equal 101 (image 123). Increased sclerosis of this lesion which now measures 28 mm on CT  (image 122/series 4 compares to more subtle less stents 17 mm lesion on prior. Likewise the lesions in the pelvis are increased in sclerotic density. IMPRESSION: 1. Partial response to therapy.  No disease progression. 2. Decreased size and radiotracer activity of RIGHT iliac and retroperitoneal adenopathy. 3. No change in number or pattern of metastatic skeletal disease; however, significant interval decrease in radiotracer activity and increased sclerosis. Lesions remain intensely radiotracer avid but again decreased in avidity 4. No evidence of visceral metastasis or pulmonary metastasis. Electronically Signed   By: Genevive Bi M.D.   On: 12/30/2023 12:19

## 2024-01-20 ENCOUNTER — Other Ambulatory Visit: Payer: Self-pay

## 2024-01-20 DIAGNOSIS — D649 Anemia, unspecified: Secondary | ICD-10-CM | POA: Insufficient documentation

## 2024-01-20 DIAGNOSIS — D61818 Other pancytopenia: Secondary | ICD-10-CM | POA: Insufficient documentation

## 2024-01-20 MED ORDER — FLUTICASONE PROPIONATE 50 MCG/ACT NA SUSP
2.0000 | Freq: Every day | NASAL | 0 refills | Status: DC
  Filled 2024-01-20: qty 16, 30d supply, fill #0

## 2024-01-20 NOTE — Assessment & Plan Note (Signed)
lisinopril and HCTZ

## 2024-01-20 NOTE — Assessment & Plan Note (Signed)
Recommend continue gabapentin

## 2024-01-20 NOTE — Assessment & Plan Note (Signed)
He is getting ADT and will be getting Xgeva at [facility] Continue Vit D 2000 international units  and calcium 1200mg  daily Control BP and CV risk management

## 2024-01-20 NOTE — Assessment & Plan Note (Signed)
Adding b12, folate and ferritin

## 2024-01-20 NOTE — Assessment & Plan Note (Signed)
Continue docetaxel 70mg /m2 Monitor toxicities

## 2024-01-21 ENCOUNTER — Inpatient Hospital Stay: Payer: Medicaid Other

## 2024-01-21 ENCOUNTER — Inpatient Hospital Stay (HOSPITAL_BASED_OUTPATIENT_CLINIC_OR_DEPARTMENT_OTHER): Payer: Medicaid Other

## 2024-01-21 VITALS — BP 122/83 | HR 107 | Temp 97.7°F | Resp 18 | Wt 246.3 lb

## 2024-01-21 VITALS — HR 99

## 2024-01-21 DIAGNOSIS — C61 Malignant neoplasm of prostate: Secondary | ICD-10-CM

## 2024-01-21 DIAGNOSIS — Z192 Hormone resistant malignancy status: Secondary | ICD-10-CM

## 2024-01-21 DIAGNOSIS — I10 Essential (primary) hypertension: Secondary | ICD-10-CM

## 2024-01-21 DIAGNOSIS — D649 Anemia, unspecified: Secondary | ICD-10-CM

## 2024-01-21 DIAGNOSIS — Z5189 Encounter for other specified aftercare: Secondary | ICD-10-CM | POA: Diagnosis not present

## 2024-01-21 DIAGNOSIS — Z95828 Presence of other vascular implants and grafts: Secondary | ICD-10-CM

## 2024-01-21 DIAGNOSIS — Z9189 Other specified personal risk factors, not elsewhere classified: Secondary | ICD-10-CM

## 2024-01-21 DIAGNOSIS — Z5111 Encounter for antineoplastic chemotherapy: Secondary | ICD-10-CM | POA: Diagnosis not present

## 2024-01-21 DIAGNOSIS — C7951 Secondary malignant neoplasm of bone: Secondary | ICD-10-CM | POA: Diagnosis not present

## 2024-01-21 DIAGNOSIS — G629 Polyneuropathy, unspecified: Secondary | ICD-10-CM | POA: Diagnosis not present

## 2024-01-21 LAB — CBC WITH DIFFERENTIAL (CANCER CENTER ONLY)
Abs Immature Granulocytes: 0.03 10*3/uL (ref 0.00–0.07)
Basophils Absolute: 0 10*3/uL (ref 0.0–0.1)
Basophils Relative: 0 %
Eosinophils Absolute: 0 10*3/uL (ref 0.0–0.5)
Eosinophils Relative: 0 %
HCT: 32.8 % — ABNORMAL LOW (ref 39.0–52.0)
Hemoglobin: 10.5 g/dL — ABNORMAL LOW (ref 13.0–17.0)
Immature Granulocytes: 1 %
Lymphocytes Relative: 18 %
Lymphs Abs: 1.2 10*3/uL (ref 0.7–4.0)
MCH: 29.8 pg (ref 26.0–34.0)
MCHC: 32 g/dL (ref 30.0–36.0)
MCV: 93.2 fL (ref 80.0–100.0)
Monocytes Absolute: 0.8 10*3/uL (ref 0.1–1.0)
Monocytes Relative: 12 %
Neutro Abs: 4.5 10*3/uL (ref 1.7–7.7)
Neutrophils Relative %: 69 %
Platelet Count: 256 10*3/uL (ref 150–400)
RBC: 3.52 MIL/uL — ABNORMAL LOW (ref 4.22–5.81)
RDW: 14.6 % (ref 11.5–15.5)
WBC Count: 6.5 10*3/uL (ref 4.0–10.5)
nRBC: 0 % (ref 0.0–0.2)

## 2024-01-21 LAB — CMP (CANCER CENTER ONLY)
ALT: 8 U/L (ref 0–44)
AST: 12 U/L — ABNORMAL LOW (ref 15–41)
Albumin: 4.1 g/dL (ref 3.5–5.0)
Alkaline Phosphatase: 239 U/L — ABNORMAL HIGH (ref 38–126)
Anion gap: 8 (ref 5–15)
BUN: 31 mg/dL — ABNORMAL HIGH (ref 6–20)
CO2: 25 mmol/L (ref 22–32)
Calcium: 9.7 mg/dL (ref 8.9–10.3)
Chloride: 105 mmol/L (ref 98–111)
Creatinine: 1.26 mg/dL — ABNORMAL HIGH (ref 0.61–1.24)
GFR, Estimated: >60 mL/min (ref >60)
Glucose, Bld: 97 mg/dL (ref 70–99)
Potassium: 3.7 mmol/L (ref 3.5–5.1)
Sodium: 138 mmol/L (ref 135–145)
Total Bilirubin: 0.4 mg/dL (ref 0.0–1.2)
Total Protein: 7.5 g/dL (ref 6.5–8.1)

## 2024-01-21 LAB — VITAMIN B12: Vitamin B-12: 6539 pg/mL — ABNORMAL HIGH (ref 180–914)

## 2024-01-21 LAB — FOLATE: Folate: 12.3 ng/mL (ref >5.9)

## 2024-01-21 LAB — FERRITIN: Ferritin: 389 ng/mL — ABNORMAL HIGH (ref 24–336)

## 2024-01-21 MED ORDER — SODIUM CHLORIDE 0.9 % IV SOLN
70.0000 mg/m2 | Freq: Once | INTRAVENOUS | Status: AC
  Administered 2024-01-21: 160 mg via INTRAVENOUS
  Filled 2024-01-21: qty 16

## 2024-01-21 MED ORDER — SODIUM CHLORIDE 0.9% FLUSH
10.0000 mL | INTRAVENOUS | Status: DC | PRN
  Administered 2024-01-21: 10 mL

## 2024-01-21 MED ORDER — HEPARIN SOD (PORK) LOCK FLUSH 100 UNIT/ML IV SOLN
500.0000 [IU] | Freq: Once | INTRAVENOUS | Status: AC | PRN
  Administered 2024-01-21: 500 [IU]

## 2024-01-21 MED ORDER — SODIUM CHLORIDE 0.9% FLUSH
10.0000 mL | Freq: Once | INTRAVENOUS | Status: AC
  Administered 2024-01-21: 10 mL

## 2024-01-21 MED ORDER — DEXAMETHASONE SODIUM PHOSPHATE 10 MG/ML IJ SOLN
10.0000 mg | Freq: Once | INTRAMUSCULAR | Status: AC
  Administered 2024-01-21: 10 mg via INTRAVENOUS
  Filled 2024-01-21: qty 1

## 2024-01-21 MED ORDER — SODIUM CHLORIDE 0.9 % IV SOLN: Freq: Once | INTRAVENOUS | Status: AC

## 2024-01-21 NOTE — Patient Instructions (Signed)
[facility] - A DEPT OF [facility]  Discharge Instructions: Thank you for choosing [facility] to provide your oncology and hematology care.   If you have a lab appointment with the Cancer Center, please go directly to the Cancer Center and check in at the registration area.   Wear comfortable clothing and clothing appropriate for easy access to any Portacath or PICC line.   We strive to give you quality time with your provider. You may need to reschedule your appointment if you arrive late (15 or more minutes).  Arriving late affects you and other patients whose appointments are after yours.  Also, if you miss three or more appointments without notifying the office, you may be dismissed from the clinic at the provider's discretion.      For prescription refill requests, have your pharmacy contact our office and allow 72 hours for refills to be completed.    Today you received the following chemotherapy and/or immunotherapy agents DOCEtaxel (TAXOTERE)      To help prevent nausea and vomiting after your treatment, we encourage you to take your nausea medication as directed.  BELOW ARE SYMPTOMS THAT SHOULD BE REPORTED IMMEDIATELY: *FEVER GREATER THAN 100.4 F (38 C) OR HIGHER *CHILLS OR SWEATING *NAUSEA AND VOMITING THAT IS NOT CONTROLLED WITH YOUR NAUSEA MEDICATION *UNUSUAL SHORTNESS OF BREATH *UNUSUAL BRUISING OR BLEEDING *URINARY PROBLEMS (pain or burning when urinating, or frequent urination) *BOWEL PROBLEMS (unusual diarrhea, constipation, pain near the anus) TENDERNESS IN MOUTH AND THROAT WITH OR WITHOUT PRESENCE OF ULCERS (sore throat, sores in mouth, or a toothache) UNUSUAL RASH, SWELLING OR PAIN  UNUSUAL VAGINAL DISCHARGE OR ITCHING   Items with * indicate a potential emergency and should be followed up as soon as possible or go to the Emergency Department if any problems should occur.  Please show the CHEMOTHERAPY ALERT CARD or  IMMUNOTHERAPY ALERT CARD at check-in to the Emergency Department and triage nurse.  Should you have questions after your visit or need to cancel or reschedule your appointment, please contact [facility] - A DEPT OF [facility]  Dept: [phone]  and follow the prompts.  Office hours are 8:00 a.m. to 4:30 p.m. Monday - Friday. Please note that voicemails left after 4:00 p.m. may not be returned until the following business day.  We are closed weekends and major holidays. You have access to a nurse at all times for urgent questions. Please call the main number to the clinic Dept: [phone] and follow the prompts.   For any non-urgent questions, you may also contact your provider using MyChart. We now offer e-Visits for anyone 76 and older to request care online for non-urgent symptoms. For details visit [email or web address].   Also download the MyChart app! Go to the app store, search "MyChart", open the app, select [place], and log in with your MyChart username and password.

## 2024-01-22 ENCOUNTER — Other Ambulatory Visit: Payer: Self-pay

## 2024-01-22 LAB — PSA, TOTAL AND FREE
PSA, Free Pct: 25.1 %
PSA, Free: 7.92 ng/mL
Prostate Specific Ag, Serum: 31.5 ng/mL — ABNORMAL HIGH (ref 0.0–4.0)

## 2024-01-23 ENCOUNTER — Inpatient Hospital Stay: Payer: Medicaid Other | Attending: Nurse Practitioner

## 2024-01-23 VITALS — BP 121/87 | HR 125 | Temp 97.9°F | Resp 17

## 2024-01-23 DIAGNOSIS — C61 Malignant neoplasm of prostate: Secondary | ICD-10-CM | POA: Insufficient documentation

## 2024-01-23 DIAGNOSIS — Z192 Hormone resistant malignancy status: Secondary | ICD-10-CM

## 2024-01-23 DIAGNOSIS — Z5111 Encounter for antineoplastic chemotherapy: Secondary | ICD-10-CM | POA: Insufficient documentation

## 2024-01-23 DIAGNOSIS — C7951 Secondary malignant neoplasm of bone: Secondary | ICD-10-CM | POA: Diagnosis not present

## 2024-01-23 DIAGNOSIS — Z5189 Encounter for other specified aftercare: Secondary | ICD-10-CM | POA: Insufficient documentation

## 2024-01-23 MED ORDER — PEGFILGRASTIM-CBQV 6 MG/0.6ML ~~LOC~~ SOSY
6.0000 mg | PREFILLED_SYRINGE | Freq: Once | SUBCUTANEOUS | Status: AC
  Administered 2024-01-23: 6 mg via SUBCUTANEOUS
  Filled 2024-01-23: qty 0.6

## 2024-01-23 NOTE — Patient Instructions (Signed)

## 2024-01-26 ENCOUNTER — Other Ambulatory Visit: Payer: Self-pay | Admitting: Nurse Practitioner

## 2024-01-26 DIAGNOSIS — M62830 Muscle spasm of back: Secondary | ICD-10-CM

## 2024-01-26 DIAGNOSIS — C61 Malignant neoplasm of prostate: Secondary | ICD-10-CM

## 2024-01-27 ENCOUNTER — Other Ambulatory Visit: Payer: Self-pay

## 2024-01-27 MED ORDER — GABAPENTIN 100 MG PO CAPS
100.0000 mg | ORAL_CAPSULE | Freq: Two times a day (BID) | ORAL | 0 refills | Status: DC | PRN
  Filled 2024-01-27: qty 120, 30d supply, fill #0

## 2024-01-27 MED ORDER — CYCLOBENZAPRINE HCL 10 MG PO TABS
10.0000 mg | ORAL_TABLET | Freq: Three times a day (TID) | ORAL | 0 refills | Status: DC | PRN
  Filled 2024-01-27: qty 30, 10d supply, fill #0

## 2024-02-02 ENCOUNTER — Other Ambulatory Visit: Payer: Self-pay

## 2024-02-05 NOTE — Progress Notes (Signed)
 Palliative Medicine [facility]  Telephone:([phone]) [phone] [phone]) [phone]   Name: Craig Collins Date: 02/05/2024 MRN: [id number]  DOB: 1966/02/19  Patient Care Team: Ivonne Andrew, NP as PCP - General (Pulmonary Disease) Cherlyn Cushing, RN as Oncology Nurse Navigator Pickenpack-Cousar, Arty Baumgartner, NP as Nurse Practitioner (Hospice and Palliative Medicine) Malachy Mood, MD as Consulting Physician (Hematology and Oncology)   INTERVAL HISTORY: Craig Collins is a 58 y.o. 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 Palliative ask to see for symptom and pain management and goals of care.   SOCIAL HISTORY:     reports that he has never smoked. He has never been exposed to tobacco smoke. He has never used smokeless tobacco. He reports that he does not currently use alcohol. He reports that he does not use drugs.  ADVANCE DIRECTIVES:  None on file  CODE STATUS: Full code  PAST MEDICAL HISTORY: Past Medical History:  Diagnosis Date   Benign prostate hyperplasia    Elevated PSA    Erectile dysfunction    Hypertension    Obesity (BMI 35.0-39.9 without comorbidity)    Prostate cancer (HCC) 05/2019   Sleep apnea    Urinary retention     ALLERGIES:  has no known allergies.  MEDICATIONS:  Current Outpatient Medications  Medication Sig Dispense Refill   albuterol (VENTOLIN HFA) 108 (90 Base) MCG/ACT inhaler Inhale 2 puffs into the lungs every 6 (six) hours as needed for wheezing or shortness of breath. 18 g 6   ascorbic acid (VITAMIN C) 500 MG tablet Take 500 mg by mouth daily.     aspirin EC 81 MG tablet Take 81 mg by mouth daily. (Patient not taking: Reported on 12/31/2023)     Calcium Carbonate-Vitamin D (CALCIUM 500/D) 500-125 MG-UNIT TABS Take 1 tablet by mouth daily.     cyanocobalamin 1000 MCG tablet Take  2 tablets (2,000 mcg total) by mouth daily. 30 tablet 0   cyclobenzaprine (FLEXERIL) 10 MG tablet Take 1 tablet (10 mg total) by mouth 3 (three) times daily as needed for muscle spasms. 30 tablet 0   fluticasone (FLONASE) 50 MCG/ACT nasal spray Place 2 sprays into both nostrils daily. 16 g 0   gabapentin (NEURONTIN) 100 MG capsule Take 1-2 capsules (100-200 mg total) by mouth 2 (two) times daily as needed for neuropathy 120 capsule 0   hydrochlorothiazide (HYDRODIURIL) 25 MG tablet TAKE 1/2 TABLET (12.5 MG TOTAL) BY MOUTH DAILY. 45 tablet 3   lidocaine-prilocaine (EMLA) cream Apply to affected area once 30 g 3   lisinopril (ZESTRIL) 20 MG tablet Take 1 tablet (20 mg total) by mouth daily. 90 tablet 3   nystatin cream (MYCOSTATIN) Apply 1 Application topically 2 (two) times daily. 30 g 1   ondansetron (ZOFRAN) 8 MG tablet Take 1 tablet (8 mg total) by mouth every 8 (eight) hours as needed for nausea or vomiting. 30 tablet 1   predniSONE (DELTASONE) 5 MG tablet Take 1 tablet (5 mg total) by mouth in the morning and at bedtime. 60 tablet 11   prochlorperazine (COMPAZINE) 10 MG tablet Take 1 tablet (10 mg total) by mouth every 6 (six) hours as needed for nausea or vomiting. 30 tablet 1   sodium chloride (OCEAN) 0.65 % SOLN nasal spray Place 1 spray into both nostrils as needed for congestion. 30 mL 0   tadalafil (CIALIS) 20 MG  tablet Take 1 tablet (20 mg total) by mouth daily as needed. 10 tablet 11   tamsulosin (FLOMAX) 0.4 MG CAPS capsule Take 1 capsule (0.4 mg total) by mouth daily. 30 capsule 3   traMADol (ULTRAM) 50 MG tablet Take 1 - 2 tablets (50 - 100 mg total) by mouth every 6 hours as needed for moderate pain or severe pain. 90 tablet 0   VIAGRA 25 MG tablet Take 1 tablet (25 mg total) by mouth daily as needed for erectile dysfunction. 10 tablet 0   No current facility-administered medications for this visit.    VITAL SIGNS: There were no vitals taken for this visit. There were no vitals  filed for this visit.  Estimated body mass index is 35.34 kg/m as calculated from the following:   Height as of 11/18/23: 5\' 10"  (1.778 m).   Weight as of 01/21/24: 246 lb 4.8 oz (111.7 kg).   PERFORMANCE STATUS (ECOG) : 1 - Symptomatic but completely ambulatory  Assessment NAD RRR AAO x4  IMPRESSION:  Mr. Craig Collins is a 58 year old male who presents for symptom management. He is doing exceptionally well. Much appreciative of this. Taking things one day at a time. No issues with appetite. Weight is stable. Reports no concerns of nausea, vomiting, diarrhea, or constipation. Is remaining as active as possible.   Craig Collins reports has significantly improved. He uses Tylenol Arthritis for pain relief, particularly a few days before his scheduled visits. It effectively alleviates his pain. He has not needed to use other pain medications recently such as Tramadol.   He continues to take gabapentin and feels it helps with his symptoms. He experiences occasional tingling, particularly at night when he is relaxed and not moving, but it is not worsening. Some days he does not experience any tingling or numbness. Advised to continue taking as prescribed.   No adjustments to current regimen. Will continue to support and follow.  Goals of Care  04/16/23-We discussed Craig Collins current illness and what it means in the larger context of his on-going co-morbidities. Natural disease trajectory and expectations were discussed. We discussed the importance of continued conversation with family and their medical providers regarding overall plan of care and treatment options, ensuring decisions are within the context of the patients values and GOCs.   Craig Collins relates that it has become more difficult for him to leave home, mostly due to the pain and fatigue. There are other factors as well, such as the sweating that he experiences without warning. Patient reports this makes him feel "blue." He is  hopeful that an improvement in his pain will translate into an improvement in his energy levels.    Craig Collins relies on his faith as a source of strength in coping with his illness. He states that he does not want to claim having cancer and intentionally turns off the television when there is content on cancer or death. He is trying to enjoy each day of his life and not look at cancer as a death sentence.   I discussed the importance of continued conversation with family and their medical providers regarding overall plan of care and treatment options, ensuring decisions are within the context of the patients values and GOCs.  Assessment and Plan  Cancer Related Pain Pain is well controlled with Tylenol ES, mainly used a few days before appointments. No need for Tramadol at this time given improvement in pain. -Continue current regimen of Tylenol as needed.  Neuropathy Tingling and  numbness sensation, more pronounced on certain days, mainly at night. Gabapentin seems to be helping with the symptoms. -Continue Gabapentin 100-200mg  twice daily as needed.  General Health Maintenance -Continue with scheduled infusion today per oncology.  -I will check in with patient in 6-8 weeks. Sooner if needed.  Any controlled substances utilized were prescribed in the context of palliative care. PDMP has been reviewed.   Visit consisted of counseling and education dealing with the complex and emotionally intense issues of symptom management and palliative care in the setting of serious and potentially life-threatening illness.  Willette Alma, AGPCNP-BC  Palliative Medicine Team/[facility]

## 2024-02-10 NOTE — Assessment & Plan Note (Signed)
 Continue docetaxel 70mg /m2 Monitor toxicities Continue ADT at [facility].

## 2024-02-10 NOTE — Assessment & Plan Note (Signed)
 lisinopril and HCTZ

## 2024-02-10 NOTE — Assessment & Plan Note (Signed)
 continue gabapentin 200 mg twice daily

## 2024-02-10 NOTE — Progress Notes (Unsigned)
 Patient Care Team: Ivonne Andrew, NP as PCP - General (Pulmonary Disease) Cherlyn Cushing, RN as Oncology Nurse Navigator Pickenpack-Cousar, Arty Baumgartner, NP as Nurse Practitioner [facility] and Palliative Medicine) Malachy Mood, MD as Consulting Physician (Hematology and Oncology)  Clinic Day:  02/11/2024  Referring physician: Malachy Mood, MD  ASSESSMENT & PLAN:   Assessment & Plan: 58 y.o. m. With history of HTN, OSA prostate cancer here for follow up.  Currently undergoing treatment for mCRPC.   Current diagnosis: mCRPC with bone metastases Initial diagnosis: GS 4+5 GG5 PSA 42 with bone metastases Germline testing: [employer] BRIP1 p. [id number] Variant, Likely Benign Somatic testing: negative for HRRm, neg for dMMR. TMB 1 Muts/Mb Previous treatment: ADT (at [facility]), ARPI (was given apalutamide but never started, enza 06/2022-08/2023) PSA nadir 12/16/22 at 0.3   Current treatment: 08/05/23 - current: docetaxel. Completed 9 cycles at reduced dose. Better tolerance on current dose.   His PET showed interval response.  Overall stable.   Improvement of pain on treatment.  Will continue docetaxel dose increased to 70 mg/m2.    Monitor for toxicities with labs, and exam. Neuropathy stable on current dose. Will continue.   Neuropathy is manageable on gabapentin.  Currently stable.  Metastatic castration-resistant adenocarcinoma of prostate (HCC) Continue docetaxel 70mg /m2 Monitor toxicities Continue ADT at [facility].  Hypertension, essential lisinopril and hydrochlorothiazide daily. BP <140.  Neuropathy continue gabapentin 200 mg twice daily  At risk for side effect of medication He is getting ADT and will be getting Xgeva at [facility] in March pending dental work Continue Vit D 2000 international units and calcium 1200mg  daily Control BP and CV risk management   Follow up as scheduled. The patient understands the plans discussed today and is in agreement with them.  He knows to  contact our office if he develops concerns prior to his next appointment.  Melven Sartorius, MD  [facility] - A DEPT OF [facility] [address] [place] [address] Dept: [phone] Dept Fax: [phone]    CHIEF COMPLAINT:  CC: mCRPC  Current Treatment:  docetaxel  INTERVAL HISTORY:  Craig Collins is here today for repeat clinical assessment. He denies fevers or chills. Report pain resolved after last dose of chemotherapy. He reports pain is always the left hip area and just returned yesterday. He takes 2 of 650 mg tab twice a day with relief. For more than 2 weeks he did not need pain medication. No nausea or vomiting. No constipation, diarrhea once. He has bloated feeling and acid reflux and Mylanta helps. Not daily. His appetite is good. No trouble walking or urinating.   Neuropathy bilateral feet and hand not worsening. Gabapentin 200 mg twice daily helps.  I have reviewed the past medical history, past surgical history, social history and family history with the patient and they are unchanged from previous note.  ALLERGIES:  has no known allergies.  MEDICATIONS:  Current Outpatient Medications  Medication Sig Dispense Refill   albuterol (VENTOLIN HFA) 108 (90 Base) MCG/ACT inhaler Inhale 2 puffs into the lungs every 6 (six) hours as needed for wheezing or shortness of breath. 18 g 6   ascorbic acid (VITAMIN C) 500 MG tablet Take 500 mg by mouth daily.     aspirin EC 81 MG tablet Take 81 mg by mouth daily. (Patient not taking: Reported on 12/31/2023)     Calcium Carbonate-Vitamin D (CALCIUM 500/D) 500-125 MG-UNIT TABS Take 1  tablet by mouth daily.     cyanocobalamin 1000 MCG tablet Take 2 tablets (2,000 mcg total) by mouth daily. 30 tablet 0   cyclobenzaprine (FLEXERIL) 10 MG tablet Take 1 tablet (10 mg total) by mouth 3 (three) times daily as needed for muscle spasms. 30 tablet 0   fluticasone (FLONASE) 50 MCG/ACT  nasal spray Place 2 sprays into both nostrils daily. 16 g 0   gabapentin (NEURONTIN) 100 MG capsule Take 1-2 capsules (100-200 mg total) by mouth 2 (two) times daily as needed for neuropathy 120 capsule 0   hydrochlorothiazide (HYDRODIURIL) 25 MG tablet TAKE 1/2 TABLET (12.5 MG TOTAL) BY MOUTH DAILY. 45 tablet 3   lidocaine-prilocaine (EMLA) cream Apply to affected area once 30 g 3   lisinopril (ZESTRIL) 20 MG tablet Take 1 tablet (20 mg total) by mouth daily. 90 tablet 3   nystatin cream (MYCOSTATIN) Apply 1 Application topically 2 (two) times daily. 30 g 1   ondansetron (ZOFRAN) 8 MG tablet Take 1 tablet (8 mg total) by mouth every 8 (eight) hours as needed for nausea or vomiting. 30 tablet 1   predniSONE (DELTASONE) 5 MG tablet Take 1 tablet (5 mg total) by mouth in the morning and at bedtime. 60 tablet 11   prochlorperazine (COMPAZINE) 10 MG tablet Take 1 tablet (10 mg total) by mouth every 6 (six) hours as needed for nausea or vomiting. 30 tablet 1   sodium chloride (OCEAN) 0.65 % SOLN nasal spray Place 1 spray into both nostrils as needed for congestion. 30 mL 0   tadalafil (CIALIS) 20 MG tablet Take 1 tablet (20 mg total) by mouth daily as needed. 10 tablet 11   tamsulosin (FLOMAX) 0.4 MG CAPS capsule Take 1 capsule (0.4 mg total) by mouth daily. 30 capsule 3   traMADol (ULTRAM) 50 MG tablet Take 1 - 2 tablets (50 - 100 mg total) by mouth every 6 hours as needed for moderate pain or severe pain. 90 tablet 0   VIAGRA 25 MG tablet Take 1 tablet (25 mg total) by mouth daily as needed for erectile dysfunction. 10 tablet 0   No current facility-administered medications for this visit.    HISTORY OF PRESENT ILLNESS:   Oncology History Overview Note   Cancer Staging  Prostate cancer [facility]) Staging form: Prostate, AJCC 8th Edition - Clinical: Stage IVB (cTX, cNX, pM1b, PSA: 42, Grade Group: 5) - Signed by Benjiman Core, MD on 07/08/2022 Prostate specific antigen (PSA) range: 20 or  greater Histologic grading system: 5 grade system     Prostate cancer (HCC) (Resolved)  12/21/2019 Initial Diagnosis   Prostate cancer (HCC)   07/08/2022 Cancer Staging   Staging form: Prostate, AJCC 8th Edition - Clinical: Stage IVB (cTX, cNX, pM1b, PSA: 42, Grade Group: 5) - Signed by Benjiman Core, MD on 07/08/2022 Prostate specific antigen (PSA) range: 20 or greater Histologic grading system: 5 grade system   Metastasis to bone (HCC) (Resolved)  07/08/2022 Initial Diagnosis   Metastasis to bone (HCC)   07/16/2023 PET scan    IMPRESSION: 1. Mixed response to therapy. 2. Interval decrease in size of the prostate gland with mild residual tracer uptake within the right posterior gland. 3. Interval decrease in size of thoracic and abdominal adenopathy. 4. Persistent, multifocal tracer avid bone metastases. Overall, the tracer avid lesions appear increased in size and degree of tracer uptake. Lesion within the right calvarium has resolved in the interval.    Miscellaneous   FoundationONE  Metastatic castration-resistant adenocarcinoma of prostate (HCC)  07/08/2022 Initial Diagnosis   Malignant neoplasm of prostate metastatic to lymph nodes of multiple sites [facility])   08/05/2023 -  Chemotherapy   Patient is on Treatment Plan : PROSTATE Docetaxel (75) + Prednisone q21d     12/28/2023 PET scan   1. Partial response to therapy.  No disease progression. 2. Decreased size and radiotracer activity of RIGHT iliac and retroperitoneal adenopathy. 3. No change in number or pattern of metastatic skeletal disease; however, significant interval decrease in radiotracer activity and increased sclerosis. Lesions remain intensely radiotracer avid but again decreased in avidity 4. No evidence of visceral metastasis or pulmonary metastasis.   01/20/2024 Cancer Staging   Staging form: Prostate, AJCC 8th Edition - Clinical: Stage IVB (cTX, cN1, pM1b) - Signed by Melven Sartorius, MD on  01/20/2024       REVIEW OF SYSTEMS:   All relevant systems were reviewed with the patient and are negative.   VITALS:  Blood pressure 124/89, pulse 100, temperature 98.7 F (37.1 C), temperature source Temporal, resp. rate 18, height 5\' 10"  (1.778 m), weight 245 lb (111.1 kg), SpO2 100%.  Wt Readings from Last 3 Encounters:  02/11/24 245 lb (111.1 kg)  01/21/24 246 lb 4.8 oz (111.7 kg)  12/31/23 249 lb 9.6 oz (113.2 kg)    Body mass index is 35.15 kg/m.  Performance status (ECOG): 1 - Symptomatic but completely ambulatory  PHYSICAL EXAM:   GENERAL:alert, no distress and comfortable SKIN: skin color normal, no rashes  EYES: normal, sclera clear OROPHARYNX: no exudate, no erythema    LUNGS: clear to auscultation with normal breathing effort.  No wheeze or rales HEART: regular rate & rhythm and no murmurs and no lower extremity edema ABDOMEN: abdomen soft, non-tender and nondistended Musculoskeletal: no edema  LABORATORY DATA:  I have reviewed the data as listed    Component Value Date/Time   NA 139 02/11/2024 1125   NA 142 10/22/2022 1548   K 3.5 02/11/2024 1125   CL 105 02/11/2024 1125   CO2 27 02/11/2024 1125   GLUCOSE 94 02/11/2024 1125   BUN 18 02/11/2024 1125   BUN 19 10/22/2022 1548   CREATININE 1.08 02/11/2024 1125   CREATININE 1.39 (H) 10/28/2017 0958   CALCIUM 9.8 02/11/2024 1125   PROT 7.2 02/11/2024 1125   PROT 6.6 10/22/2022 1548   ALBUMIN 4.0 02/11/2024 1125   ALBUMIN 3.9 10/22/2022 1548   AST 9 (L) 02/11/2024 1125   ALT 7 02/11/2024 1125   ALKPHOS 271 (H) 02/11/2024 1125   BILITOT 0.4 02/11/2024 1125   GFRNONAA >60 02/11/2024 1125   GFRAA 65 06/18/2020 0921    No results found for: "SPEP", "UPEP"  Lab Results  Component Value Date   WBC 6.6 02/11/2024   NEUTROABS 4.7 02/11/2024   HGB 10.4 (L) 02/11/2024   HCT 32.6 (L) 02/11/2024   MCV 92.9 02/11/2024   PLT 267 02/11/2024      Chemistry      Component Value Date/Time   NA 139  02/11/2024 1125   NA 142 10/22/2022 1548   K 3.5 02/11/2024 1125   CL 105 02/11/2024 1125   CO2 27 02/11/2024 1125   BUN 18 02/11/2024 1125   BUN 19 10/22/2022 1548   CREATININE 1.08 02/11/2024 1125   CREATININE 1.39 (H) 10/28/2017 0958      Component Value Date/Time   CALCIUM 9.8 02/11/2024 1125   ALKPHOS 271 (H) 02/11/2024 1125   AST 9 (L)  02/11/2024 1125   ALT 7 02/11/2024 1125   BILITOT 0.4 02/11/2024 1125       RADIOGRAPHIC STUDIES: I have personally reviewed the radiological images as listed and agreed with the findings in the report. No results found.

## 2024-02-11 ENCOUNTER — Inpatient Hospital Stay: Payer: Medicaid Other

## 2024-02-11 ENCOUNTER — Inpatient Hospital Stay (HOSPITAL_BASED_OUTPATIENT_CLINIC_OR_DEPARTMENT_OTHER): Payer: Medicaid Other | Admitting: Nurse Practitioner

## 2024-02-11 ENCOUNTER — Inpatient Hospital Stay (HOSPITAL_BASED_OUTPATIENT_CLINIC_OR_DEPARTMENT_OTHER): Payer: Medicaid Other

## 2024-02-11 ENCOUNTER — Encounter: Payer: Self-pay | Admitting: Nurse Practitioner

## 2024-02-11 VITALS — BP 124/89 | HR 100 | Temp 98.7°F | Resp 18 | Ht 70.0 in | Wt 245.0 lb

## 2024-02-11 DIAGNOSIS — C61 Malignant neoplasm of prostate: Secondary | ICD-10-CM | POA: Diagnosis not present

## 2024-02-11 DIAGNOSIS — Z9189 Other specified personal risk factors, not elsewhere classified: Secondary | ICD-10-CM

## 2024-02-11 DIAGNOSIS — I10 Essential (primary) hypertension: Secondary | ICD-10-CM

## 2024-02-11 DIAGNOSIS — G629 Polyneuropathy, unspecified: Secondary | ICD-10-CM

## 2024-02-11 DIAGNOSIS — Z95828 Presence of other vascular implants and grafts: Secondary | ICD-10-CM

## 2024-02-11 DIAGNOSIS — Z515 Encounter for palliative care: Secondary | ICD-10-CM | POA: Diagnosis not present

## 2024-02-11 DIAGNOSIS — G893 Neoplasm related pain (acute) (chronic): Secondary | ICD-10-CM

## 2024-02-11 DIAGNOSIS — M792 Neuralgia and neuritis, unspecified: Secondary | ICD-10-CM | POA: Diagnosis not present

## 2024-02-11 DIAGNOSIS — Z192 Hormone resistant malignancy status: Secondary | ICD-10-CM

## 2024-02-11 DIAGNOSIS — C7951 Secondary malignant neoplasm of bone: Secondary | ICD-10-CM | POA: Diagnosis not present

## 2024-02-11 DIAGNOSIS — Z5189 Encounter for other specified aftercare: Secondary | ICD-10-CM | POA: Diagnosis not present

## 2024-02-11 DIAGNOSIS — Z5111 Encounter for antineoplastic chemotherapy: Secondary | ICD-10-CM | POA: Diagnosis not present

## 2024-02-11 LAB — CMP (CANCER CENTER ONLY)
ALT: 7 U/L (ref 0–44)
AST: 9 U/L — ABNORMAL LOW (ref 15–41)
Albumin: 4 g/dL (ref 3.5–5.0)
Alkaline Phosphatase: 271 U/L — ABNORMAL HIGH (ref 38–126)
Anion gap: 7 (ref 5–15)
BUN: 18 mg/dL (ref 6–20)
CO2: 27 mmol/L (ref 22–32)
Calcium: 9.8 mg/dL (ref 8.9–10.3)
Chloride: 105 mmol/L (ref 98–111)
Creatinine: 1.08 mg/dL (ref 0.61–1.24)
GFR, Estimated: >60 mL/min (ref >60)
Glucose, Bld: 94 mg/dL (ref 70–99)
Potassium: 3.5 mmol/L (ref 3.5–5.1)
Sodium: 139 mmol/L (ref 135–145)
Total Bilirubin: 0.4 mg/dL (ref 0.0–1.2)
Total Protein: 7.2 g/dL (ref 6.5–8.1)

## 2024-02-11 LAB — CBC WITH DIFFERENTIAL (CANCER CENTER ONLY)
Abs Immature Granulocytes: 0.02 10*3/uL (ref 0.00–0.07)
Basophils Absolute: 0 10*3/uL (ref 0.0–0.1)
Basophils Relative: 1 %
Eosinophils Absolute: 0 10*3/uL (ref 0.0–0.5)
Eosinophils Relative: 0 %
HCT: 32.6 % — ABNORMAL LOW (ref 39.0–52.0)
Hemoglobin: 10.4 g/dL — ABNORMAL LOW (ref 13.0–17.0)
Immature Granulocytes: 0 %
Lymphocytes Relative: 16 %
Lymphs Abs: 1.1 10*3/uL (ref 0.7–4.0)
MCH: 29.6 pg (ref 26.0–34.0)
MCHC: 31.9 g/dL (ref 30.0–36.0)
MCV: 92.9 fL (ref 80.0–100.0)
Monocytes Absolute: 0.8 10*3/uL (ref 0.1–1.0)
Monocytes Relative: 12 %
Neutro Abs: 4.7 10*3/uL (ref 1.7–7.7)
Neutrophils Relative %: 71 %
Platelet Count: 267 10*3/uL (ref 150–400)
RBC: 3.51 MIL/uL — ABNORMAL LOW (ref 4.22–5.81)
RDW: 15 % (ref 11.5–15.5)
WBC Count: 6.6 10*3/uL (ref 4.0–10.5)
nRBC: 0 % (ref 0.0–0.2)

## 2024-02-11 MED ORDER — SODIUM CHLORIDE 0.9 % IV SOLN: Freq: Once | INTRAVENOUS | Status: AC

## 2024-02-11 MED ORDER — SODIUM CHLORIDE 0.9% FLUSH
10.0000 mL | Freq: Once | INTRAVENOUS | Status: AC
  Administered 2024-02-11: 10 mL

## 2024-02-11 MED ORDER — SODIUM CHLORIDE 0.9 % IV SOLN
70.0000 mg/m2 | Freq: Once | INTRAVENOUS | Status: AC
  Administered 2024-02-11: 160 mg via INTRAVENOUS
  Filled 2024-02-11: qty 16

## 2024-02-11 MED ORDER — SODIUM CHLORIDE 0.9% FLUSH
10.0000 mL | INTRAVENOUS | Status: DC | PRN
  Administered 2024-02-11: 10 mL

## 2024-02-11 MED ORDER — HEPARIN SOD (PORK) LOCK FLUSH 100 UNIT/ML IV SOLN
500.0000 [IU] | Freq: Once | INTRAVENOUS | Status: AC | PRN
  Administered 2024-02-11: 500 [IU]

## 2024-02-11 MED ORDER — DEXAMETHASONE SODIUM PHOSPHATE 10 MG/ML IJ SOLN
10.0000 mg | Freq: Once | INTRAMUSCULAR | Status: AC
Start: 2024-02-11 — End: 2024-02-11
  Administered 2024-02-11: 10 mg via INTRAVENOUS
  Filled 2024-02-11: qty 1

## 2024-02-11 NOTE — Assessment & Plan Note (Signed)
 He is getting ADT and will be getting Xgeva at [facility] in March pending dental work Continue Vit D 2000 international units and calcium 1200mg  daily Control BP and CV risk management

## 2024-02-11 NOTE — Patient Instructions (Signed)
 [facility]  Discharge Instructions: Thank you for choosing [facility] to provide your oncology and hematology care.   If you have a lab appointment with the Cancer Center, please go directly to the Cancer Center and check in at the registration area.   Wear comfortable clothing and clothing appropriate for easy access to any Portacath or PICC line.   We strive to give you quality time with your provider. You may need to reschedule your appointment if you arrive late (15 or more minutes).  Arriving late affects you and other patients whose appointments are after yours.  Also, if you miss three or more appointments without notifying the office, you may be dismissed from the clinic at the provider's discretion.      For prescription refill requests, have your pharmacy contact our office and allow 72 hours for refills to be completed.    Today you received the following chemotherapy and/or immunotherapy agents: Docetaxel      To help prevent nausea and vomiting after your treatment, we encourage you to take your nausea medication as directed.  BELOW ARE SYMPTOMS THAT SHOULD BE REPORTED IMMEDIATELY: *FEVER GREATER THAN 100.4 F (38 C) OR HIGHER *CHILLS OR SWEATING *NAUSEA AND VOMITING THAT IS NOT CONTROLLED WITH YOUR NAUSEA MEDICATION *UNUSUAL SHORTNESS OF BREATH *UNUSUAL BRUISING OR BLEEDING *URINARY PROBLEMS (pain or burning when urinating, or frequent urination) *BOWEL PROBLEMS (unusual diarrhea, constipation, pain near the anus) TENDERNESS IN MOUTH AND THROAT WITH OR WITHOUT PRESENCE OF ULCERS (sore throat, sores in mouth, or a toothache) UNUSUAL RASH, SWELLING OR PAIN  UNUSUAL VAGINAL DISCHARGE OR ITCHING   Items with * indicate a potential emergency and should be followed up as soon as possible or go to the Emergency Department if any problems should occur.  Please show the CHEMOTHERAPY ALERT CARD or IMMUNOTHERAPY  ALERT CARD at check-in to the Emergency Department and triage nurse.  Should you have questions after your visit or need to cancel or reschedule your appointment, please contact [facility] - A DEPT OF [facility]  Dept: [phone]  and follow the prompts.  Office hours are 8:00 a.m. to 4:30 p.m. Monday - Friday. Please note that voicemails left after 4:00 p.m. may not be returned until the following business day.  We are closed weekends and major holidays. You have access to a nurse at all times for urgent questions. Please call the main number to the clinic Dept: [phone] and follow the prompts.   For any non-urgent questions, you may also contact your provider using MyChart. We now offer e-Visits for anyone 58 and older to request care online for non-urgent symptoms. For details visit [email or web address].   Also download the MyChart app! Go to the app store, search "MyChart", open the app, select , and log in with your MyChart username and password.`

## 2024-02-12 LAB — PSA, TOTAL AND FREE
PSA, Free Pct: 21.6 %
PSA, Free: 6.76 ng/mL
Prostate Specific Ag, Serum: 31.3 ng/mL — ABNORMAL HIGH (ref 0.0–4.0)

## 2024-02-13 ENCOUNTER — Inpatient Hospital Stay: Payer: Medicaid Other

## 2024-02-13 VITALS — BP 114/74 | HR 111 | Temp 98.1°F | Resp 17

## 2024-02-13 DIAGNOSIS — C7951 Secondary malignant neoplasm of bone: Secondary | ICD-10-CM | POA: Diagnosis not present

## 2024-02-13 DIAGNOSIS — Z5111 Encounter for antineoplastic chemotherapy: Secondary | ICD-10-CM | POA: Diagnosis not present

## 2024-02-13 DIAGNOSIS — Z192 Hormone resistant malignancy status: Secondary | ICD-10-CM

## 2024-02-13 DIAGNOSIS — Z5189 Encounter for other specified aftercare: Secondary | ICD-10-CM | POA: Diagnosis not present

## 2024-02-13 DIAGNOSIS — C61 Malignant neoplasm of prostate: Secondary | ICD-10-CM | POA: Diagnosis not present

## 2024-02-13 MED ORDER — PEGFILGRASTIM-CBQV 6 MG/0.6ML ~~LOC~~ SOSY
6.0000 mg | PREFILLED_SYRINGE | Freq: Once | SUBCUTANEOUS | Status: AC
  Administered 2024-02-13: 6 mg via SUBCUTANEOUS

## 2024-02-17 ENCOUNTER — Other Ambulatory Visit: Payer: Self-pay | Admitting: Nurse Practitioner

## 2024-02-17 ENCOUNTER — Other Ambulatory Visit: Payer: Self-pay

## 2024-02-17 DIAGNOSIS — C778 Secondary and unspecified malignant neoplasm of lymph nodes of multiple regions: Secondary | ICD-10-CM

## 2024-02-17 DIAGNOSIS — J019 Acute sinusitis, unspecified: Secondary | ICD-10-CM

## 2024-02-17 MED ORDER — FLUTICASONE PROPIONATE 50 MCG/ACT NA SUSP
2.0000 | Freq: Every day | NASAL | 0 refills | Status: DC
  Filled 2024-02-17: qty 16, 30d supply, fill #0
  Filled 2024-03-17: qty 16, 30d supply, fill #1
  Filled 2024-04-06 – 2024-04-08 (×2): qty 16, 30d supply, fill #2

## 2024-02-17 MED ORDER — GABAPENTIN 100 MG PO CAPS
100.0000 mg | ORAL_CAPSULE | Freq: Two times a day (BID) | ORAL | 0 refills | Status: DC | PRN
  Filled 2024-02-17 – 2024-02-22 (×2): qty 120, 30d supply, fill #0

## 2024-02-18 ENCOUNTER — Other Ambulatory Visit: Payer: Self-pay

## 2024-02-19 ENCOUNTER — Other Ambulatory Visit: Payer: Self-pay

## 2024-02-22 ENCOUNTER — Other Ambulatory Visit: Payer: Self-pay

## 2024-02-25 ENCOUNTER — Other Ambulatory Visit: Payer: Self-pay

## 2024-03-02 NOTE — Progress Notes (Addendum)
 Patient Care Team: Ivonne Andrew, NP as PCP - General (Pulmonary Disease) Cherlyn Cushing, RN as Oncology Nurse Navigator Pickenpack-Cousar, Arty Baumgartner, NP as Nurse Practitioner [facility] and Palliative Medicine) Malachy Mood, MD as Consulting Physician (Hematology and Oncology)  Clinic Day:  03/03/2024  Referring physician: Malachy Mood, MD  ASSESSMENT & PLAN:   Assessment & Plan:  Addendum Patient's PSA continues to rise despite adequate treatment. Concerning for prostate cancer progression. It has been trending up. From 15, to 69, now 40.5. Recommend proceed with PSMA PET for evaluation of disease progression.  58 y.o. m. With history of HTN, OSA prostate cancer here for follow up.  Currently undergoing treatment for mCRPC.   Current diagnosis: mCRPC with bone metastases Initial diagnosis: GS 4+5 GG5 PSA 42 with bone metastases Germline testing: [employer] BRIP1 p. [id number] Variant, Likely Benign Somatic testing: negative for HRRm, neg for dMMR. TMB 1 Muts/Mb Previous treatment: ADT (at [facility]), ARPI (was given apalutamide but never started, enza 06/2022-08/2023) PSA nadir 12/16/22 at 0.3  Current treatment: 08/05/23 - current: docetaxel. Completed 9 cycles at reduced dose. Better tolerance on current dose.   New left sided pelvic, hip pain radiating down the left thigh and leg. Previously with metastases. Will evaluate for progression or new concerns by MRI.   Will continue docetaxel dose increased to 70 mg/m2.   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 Neuropathy comes and goes Continue ADT at [facility].  Low back pain radiating to left lower extremity With history of bone metastases will obtain MRI of L hip and L spine  Neuropathy continue gabapentin 100 mg twice daily  Hypertension, essential lisinopril and hydrochlorothiazide daily. BP <140.    The patient  understands the plans discussed today and is in agreement with them.  He knows to contact our office if he develops concerns prior to his next appointment.  Melven Sartorius, MD  [facility] - A DEPT OF [facility] [address] [place] [address] Dept: [phone] Dept Fax: [phone]   Orders Placed This Encounter  Procedures   MR Lumbar Spine W Wo Contrast    Standing Status:   Future    Expected Date:   03/10/2024    Expiration Date:   03/03/2025    If indicated for the ordered procedure, I authorize the administration of contrast media per Radiology protocol:   Yes    What is the patient's sedation requirement?:   No Sedation    Does the patient have a pacemaker or implanted devices?:   No    Use SRS Protocol?:   No    Preferred imaging location?:   [facility] (table limit - 550 lbs)   MR HIP LEFT W WO CONTRAST    Standing Status:   Future    Expected Date:   03/10/2024    Expiration Date:   03/03/2025    If indicated for the ordered procedure, I authorize the administration of contrast media per Radiology protocol:   Yes    What is the patient's sedation requirement?:   No Sedation    Does the patient have a pacemaker or implanted devices?:   No    Preferred imaging location?:   [facility] (table limit - 550 lbs)      CHIEF COMPLAINT:  CC: mCRPC  Current Treatment:  ADT/docetaxel  INTERVAL  HISTORY:  Craig Collins is here today for repeat clinical assessment. He denies fevers or chills. His appetite is good.  Some flashes tolerable. No chest pain, coughing, short of breath, stomach pain, nausea, vomiting. Having gas bloating sensation certain food makes it worse.  Pain on the left hip area radiating down to left leg. Slightly different and stopped if pinched his thigh. Walking with limp with pain started.  I have reviewed the past medical history, past surgical history, social history and  family history with the patient and they are unchanged from previous note.  ALLERGIES:  has no known allergies.  MEDICATIONS:  Current Outpatient Medications  Medication Sig Dispense Refill   albuterol (VENTOLIN HFA) 108 (90 Base) MCG/ACT inhaler Inhale 2 puffs into the lungs every 6 (six) hours as needed for wheezing or shortness of breath. 18 g 6   ascorbic acid (VITAMIN C) 500 MG tablet Take 500 mg by mouth daily.     aspirin EC 81 MG tablet Take 81 mg by mouth daily.     Calcium Carbonate-Vitamin D (CALCIUM 500/D) 500-125 MG-UNIT TABS Take 1 tablet by mouth daily.     cetirizine (ZYRTEC) 10 MG tablet Take 1 tablet (10 mg total) by mouth daily. 30 tablet 11   cyanocobalamin 1000 MCG tablet Take 2 tablets (2,000 mcg total) by mouth daily. 30 tablet 0   cyclobenzaprine (FLEXERIL) 10 MG tablet Take 1 tablet (10 mg total) by mouth 3 (three) times daily as needed for muscle spasms. 30 tablet 0   fluticasone (FLONASE) 50 MCG/ACT nasal spray Place 2 sprays into both nostrils daily. 48 g 0   gabapentin (NEURONTIN) 100 MG capsule Take 1-2 capsules (100-200 mg total) by mouth 2 (two) times daily as needed for neuropathy 120 capsule 0   hydrochlorothiazide (HYDRODIURIL) 25 MG tablet TAKE 1/2 TABLET (12.5 MG TOTAL) BY MOUTH DAILY. 45 tablet 3   lidocaine-prilocaine (EMLA) cream Apply to affected area once 30 g 3   lisinopril (ZESTRIL) 20 MG tablet Take 1 tablet (20 mg total) by mouth daily. 90 tablet 3   nystatin cream (MYCOSTATIN) Apply 1 Application topically 2 (two) times daily. 30 g 1   ondansetron (ZOFRAN) 8 MG tablet Take 1 tablet (8 mg total) by mouth every 8 (eight) hours as needed for nausea or vomiting. 30 tablet 1   predniSONE (DELTASONE) 5 MG tablet Take 1 tablet (5 mg total) by mouth in the morning and at bedtime. 60 tablet 11   prochlorperazine (COMPAZINE) 10 MG tablet Take 1 tablet (10 mg total) by mouth every 6 (six) hours as needed for nausea or vomiting. 30 tablet 1   sodium chloride  (OCEAN) 0.65 % SOLN nasal spray Place 1 spray into both nostrils as needed for congestion. 30 mL 0   tadalafil (CIALIS) 20 MG tablet Take 1 tablet (20 mg total) by mouth daily as needed. 10 tablet 11   tamsulosin (FLOMAX) 0.4 MG CAPS capsule Take 1 capsule (0.4 mg total) by mouth daily. 30 capsule 3   traMADol (ULTRAM) 50 MG tablet Take 1 - 2 tablets (50 - 100 mg total) by mouth every 6 hours as needed for moderate pain or severe pain. 90 tablet 0   VIAGRA 25 MG tablet Take 1 tablet (25 mg total) by mouth daily as needed for erectile dysfunction. 10 tablet 0   No current facility-administered medications for this visit.    HISTORY OF PRESENT ILLNESS:   Oncology History Overview Note   Cancer Staging  Prostate cancer (  HCC) Staging form: Prostate, AJCC 8th Edition - Clinical: Stage IVB (cTX, cNX, pM1b, PSA: 42, Grade Group: 5) - Signed by Benjiman Core, MD on 07/08/2022 Prostate specific antigen (PSA) range: 20 or greater Histologic grading system: 5 grade system     Prostate cancer (HCC) (Resolved)  12/21/2019 Initial Diagnosis   Prostate cancer (HCC)   07/08/2022 Cancer Staging   Staging form: Prostate, AJCC 8th Edition - Clinical: Stage IVB (cTX, cNX, pM1b, PSA: 42, Grade Group: 5) - Signed by Benjiman Core, MD on 07/08/2022 Prostate specific antigen (PSA) range: 20 or greater Histologic grading system: 5 grade system   Metastasis to bone (HCC) (Resolved)  07/08/2022 Initial Diagnosis   Metastasis to bone (HCC)   07/16/2023 PET scan    IMPRESSION: 1. Mixed response to therapy. 2. Interval decrease in size of the prostate gland with mild residual tracer uptake within the right posterior gland. 3. Interval decrease in size of thoracic and abdominal adenopathy. 4. Persistent, multifocal tracer avid bone metastases. Overall, the tracer avid lesions appear increased in size and degree of tracer uptake. Lesion within the right calvarium has resolved in the interval.     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 Partial response to therapy.  No disease progression. 2. Decreased size and radiotracer activity of RIGHT iliac and retroperitoneal adenopathy. 3. No change in number or pattern of metastatic skeletal disease; however, significant interval decrease in radiotracer activity and increased sclerosis. Lesions remain intensely radiotracer avid but again decreased in avidity 4. No evidence of visceral metastasis or pulmonary metastasis.   01/20/2024 Cancer Staging   Staging form: Prostate, AJCC 8th Edition - Clinical: Stage IVB (cTX, cN1, pM1b) - Signed by Melven Sartorius, MD on 01/20/2024       REVIEW OF SYSTEMS:   All relevant systems were reviewed with the patient and are negative.   VITALS:  Blood pressure 123/72, pulse (!) 119, temperature 97.7 F (36.5 C), temperature source Temporal, resp. rate 18, weight 248 lb 11.2 oz (112.8 kg), SpO2 98%.  Wt Readings from Last 3 Encounters:  03/03/24 248 lb 11.2 oz (112.8 kg)  03/03/24 248 lb 3.2 oz (112.6 kg)  02/11/24 245 lb (111.1 kg)    Body mass index is 35.68 kg/m.  Performance status (ECOG): 1 - Symptomatic but completely ambulatory  PHYSICAL EXAM:   GENERAL: alert, no distress and comfortable LUNGS: clear to auscultation with normal breathing effort.  No wheeze or rales HEART: regular rate & rhythm and no murmurs and no lower extremity edema ABDOMEN: abdomen soft, non-tender and nondistended Musculoskeletal: no edema NEURO: alert, Strength and sensation equal bilaterally.  LABORATORY DATA:  I have reviewed the data as listed    Component Value Date/Time   NA 137 03/03/2024 1139    NA 142 10/22/2022 1548   K 3.9 03/03/2024 1139   CL 104 03/03/2024 1139   CO2 28 03/03/2024 1139   GLUCOSE 108 (H) 03/03/2024 1139   BUN 22 (H) 03/03/2024 1139   BUN 19 10/22/2022 1548   CREATININE 1.17 03/03/2024 1139   CREATININE 1.39 (H) 10/28/2017 0958   CALCIUM 9.3 03/03/2024 1139   PROT 7.3 03/03/2024 1139  PROT 6.6 10/22/2022 1548   ALBUMIN 4.1 03/03/2024 1139   ALBUMIN 3.9 10/22/2022 1548   AST 11 (L) 03/03/2024 1139   ALT 8 03/03/2024 1139   ALKPHOS 195 (H) 03/03/2024 1139   BILITOT 0.3 03/03/2024 1139   GFRNONAA >60 03/03/2024 1139   GFRAA 65 06/18/2020 0921    No results found for: "SPEP", "UPEP"  Lab Results  Component Value Date   WBC 9.5 03/03/2024   NEUTROABS 7.8 (H) 03/03/2024   HGB 9.5 (L) 03/03/2024   HCT 29.9 (L) 03/03/2024   MCV 93.4 03/03/2024   PLT 234 03/03/2024      Chemistry      Component Value Date/Time   NA 137 03/03/2024 1139   NA 142 10/22/2022 1548   K 3.9 03/03/2024 1139   CL 104 03/03/2024 1139   CO2 28 03/03/2024 1139   BUN 22 (H) 03/03/2024 1139   BUN 19 10/22/2022 1548   CREATININE 1.17 03/03/2024 1139   CREATININE 1.39 (H) 10/28/2017 0958      Component Value Date/Time   CALCIUM 9.3 03/03/2024 1139   ALKPHOS 195 (H) 03/03/2024 1139   AST 11 (L) 03/03/2024 1139   ALT 8 03/03/2024 1139   BILITOT 0.3 03/03/2024 1139       RADIOGRAPHIC STUDIES: I have personally reviewed the radiological images as listed and agreed with the findings in the report. No results found.

## 2024-03-03 ENCOUNTER — Inpatient Hospital Stay: Payer: Medicaid Other | Attending: Nurse Practitioner

## 2024-03-03 ENCOUNTER — Inpatient Hospital Stay (HOSPITAL_BASED_OUTPATIENT_CLINIC_OR_DEPARTMENT_OTHER): Payer: Medicaid Other

## 2024-03-03 ENCOUNTER — Other Ambulatory Visit: Payer: Self-pay

## 2024-03-03 ENCOUNTER — Inpatient Hospital Stay: Payer: Medicaid Other

## 2024-03-03 ENCOUNTER — Ambulatory Visit: Payer: Self-pay | Admitting: Nurse Practitioner

## 2024-03-03 ENCOUNTER — Encounter: Payer: Self-pay | Admitting: Nurse Practitioner

## 2024-03-03 VITALS — HR 99 | Resp 20

## 2024-03-03 VITALS — BP 99/67 | HR 113 | Temp 98.1°F | Wt 248.2 lb

## 2024-03-03 VITALS — BP 123/72 | HR 119 | Temp 97.7°F | Resp 18 | Wt 248.7 lb

## 2024-03-03 DIAGNOSIS — G629 Polyneuropathy, unspecified: Secondary | ICD-10-CM | POA: Diagnosis not present

## 2024-03-03 DIAGNOSIS — J302 Other seasonal allergic rhinitis: Secondary | ICD-10-CM | POA: Diagnosis not present

## 2024-03-03 DIAGNOSIS — M545 Low back pain, unspecified: Secondary | ICD-10-CM | POA: Diagnosis not present

## 2024-03-03 DIAGNOSIS — C61 Malignant neoplasm of prostate: Secondary | ICD-10-CM | POA: Diagnosis not present

## 2024-03-03 DIAGNOSIS — I10 Essential (primary) hypertension: Secondary | ICD-10-CM

## 2024-03-03 DIAGNOSIS — M79605 Pain in left leg: Secondary | ICD-10-CM | POA: Insufficient documentation

## 2024-03-03 DIAGNOSIS — C7951 Secondary malignant neoplasm of bone: Secondary | ICD-10-CM | POA: Insufficient documentation

## 2024-03-03 DIAGNOSIS — Z95828 Presence of other vascular implants and grafts: Secondary | ICD-10-CM

## 2024-03-03 DIAGNOSIS — Z192 Hormone resistant malignancy status: Secondary | ICD-10-CM

## 2024-03-03 DIAGNOSIS — Z5111 Encounter for antineoplastic chemotherapy: Secondary | ICD-10-CM | POA: Insufficient documentation

## 2024-03-03 LAB — CBC WITH DIFFERENTIAL (CANCER CENTER ONLY)
Abs Immature Granulocytes: 0.02 10*3/uL (ref 0.00–0.07)
Basophils Absolute: 0 10*3/uL (ref 0.0–0.1)
Basophils Relative: 0 %
Eosinophils Absolute: 0 10*3/uL (ref 0.0–0.5)
Eosinophils Relative: 0 %
HCT: 29.9 % — ABNORMAL LOW (ref 39.0–52.0)
Hemoglobin: 9.5 g/dL — ABNORMAL LOW (ref 13.0–17.0)
Immature Granulocytes: 0 %
Lymphocytes Relative: 8 %
Lymphs Abs: 0.8 10*3/uL (ref 0.7–4.0)
MCH: 29.7 pg (ref 26.0–34.0)
MCHC: 31.8 g/dL (ref 30.0–36.0)
MCV: 93.4 fL (ref 80.0–100.0)
Monocytes Absolute: 0.9 10*3/uL (ref 0.1–1.0)
Monocytes Relative: 9 %
Neutro Abs: 7.8 10*3/uL — ABNORMAL HIGH (ref 1.7–7.7)
Neutrophils Relative %: 83 %
Platelet Count: 234 10*3/uL (ref 150–400)
RBC: 3.2 MIL/uL — ABNORMAL LOW (ref 4.22–5.81)
RDW: 15.2 % (ref 11.5–15.5)
WBC Count: 9.5 10*3/uL (ref 4.0–10.5)
nRBC: 0 % (ref 0.0–0.2)

## 2024-03-03 LAB — CMP (CANCER CENTER ONLY)
ALT: 8 U/L (ref 0–44)
AST: 11 U/L — ABNORMAL LOW (ref 15–41)
Albumin: 4.1 g/dL (ref 3.5–5.0)
Alkaline Phosphatase: 195 U/L — ABNORMAL HIGH (ref 38–126)
Anion gap: 5 (ref 5–15)
BUN: 22 mg/dL — ABNORMAL HIGH (ref 6–20)
CO2: 28 mmol/L (ref 22–32)
Calcium: 9.3 mg/dL (ref 8.9–10.3)
Chloride: 104 mmol/L (ref 98–111)
Creatinine: 1.17 mg/dL (ref 0.61–1.24)
GFR, Estimated: >60 mL/min (ref >60)
Glucose, Bld: 108 mg/dL — ABNORMAL HIGH (ref 70–99)
Potassium: 3.9 mmol/L (ref 3.5–5.1)
Sodium: 137 mmol/L (ref 135–145)
Total Bilirubin: 0.3 mg/dL (ref 0.0–1.2)
Total Protein: 7.3 g/dL (ref 6.5–8.1)

## 2024-03-03 MED ORDER — SODIUM CHLORIDE 0.9% FLUSH
10.0000 mL | INTRAVENOUS | Status: DC | PRN
  Administered 2024-03-03: 10 mL

## 2024-03-03 MED ORDER — SODIUM CHLORIDE 0.9 % IV SOLN
70.0000 mg/m2 | Freq: Once | INTRAVENOUS | Status: AC
  Administered 2024-03-03: 160 mg via INTRAVENOUS
  Filled 2024-03-03: qty 16

## 2024-03-03 MED ORDER — CETIRIZINE HCL 10 MG PO TABS
10.0000 mg | ORAL_TABLET | Freq: Every day | ORAL | 11 refills | Status: DC
  Filled 2024-03-03: qty 30, 30d supply, fill #0
  Filled 2024-04-06: qty 30, 30d supply, fill #1
  Filled 2024-05-20: qty 30, 30d supply, fill #2

## 2024-03-03 MED ORDER — HEPARIN SOD (PORK) LOCK FLUSH 100 UNIT/ML IV SOLN
500.0000 [IU] | Freq: Once | INTRAVENOUS | Status: AC | PRN
  Administered 2024-03-03: 500 [IU]

## 2024-03-03 MED ORDER — SODIUM CHLORIDE 0.9% FLUSH
10.0000 mL | Freq: Once | INTRAVENOUS | Status: AC
  Administered 2024-03-03: 10 mL

## 2024-03-03 MED ORDER — SODIUM CHLORIDE 0.9 % IV SOLN: Freq: Once | INTRAVENOUS | Status: AC

## 2024-03-03 MED ORDER — DEXAMETHASONE SODIUM PHOSPHATE 10 MG/ML IJ SOLN
10.0000 mg | Freq: Once | INTRAMUSCULAR | Status: AC
  Administered 2024-03-03: 10 mg via INTRAVENOUS
  Filled 2024-03-03: qty 1

## 2024-03-03 NOTE — Assessment & Plan Note (Signed)
 lisinopril and hydrochlorothiazide daily. BP <140.

## 2024-03-03 NOTE — Patient Instructions (Signed)
 [facility]  Discharge Instructions: Thank you for choosing [facility] to provide your oncology and hematology care.   If you have a lab appointment with the Cancer Center, please go directly to the Cancer Center and check in at the registration area.   Wear comfortable clothing and clothing appropriate for easy access to any Portacath or PICC line.   We strive to give you quality time with your provider. You may need to reschedule your appointment if you arrive late (15 or more minutes).  Arriving late affects you and other patients whose appointments are after yours.  Also, if you miss three or more appointments without notifying the office, you may be dismissed from the clinic at the provider's discretion.      For prescription refill requests, have your pharmacy contact our office and allow 72 hours for refills to be completed.    Today you received the following chemotherapy and/or immunotherapy agents: Docetaxel      To help prevent nausea and vomiting after your treatment, we encourage you to take your nausea medication as directed.  BELOW ARE SYMPTOMS THAT SHOULD BE REPORTED IMMEDIATELY: *FEVER GREATER THAN 100.4 F (38 C) OR HIGHER *CHILLS OR SWEATING *NAUSEA AND VOMITING THAT IS NOT CONTROLLED WITH YOUR NAUSEA MEDICATION *UNUSUAL SHORTNESS OF BREATH *UNUSUAL BRUISING OR BLEEDING *URINARY PROBLEMS (pain or burning when urinating, or frequent urination) *BOWEL PROBLEMS (unusual diarrhea, constipation, pain near the anus) TENDERNESS IN MOUTH AND THROAT WITH OR WITHOUT PRESENCE OF ULCERS (sore throat, sores in mouth, or a toothache) UNUSUAL RASH, SWELLING OR PAIN  UNUSUAL VAGINAL DISCHARGE OR ITCHING   Items with * indicate a potential emergency and should be followed up as soon as possible or go to the Emergency Department if any problems should occur.  Please show the CHEMOTHERAPY ALERT CARD or IMMUNOTHERAPY  ALERT CARD at check-in to the Emergency Department and triage nurse.  Should you have questions after your visit or need to cancel or reschedule your appointment, please contact [facility] - A DEPT OF [facility]  Dept: [phone]  and follow the prompts.  Office hours are 8:00 a.m. to 4:30 p.m. Monday - Friday. Please note that voicemails left after 4:00 p.m. may not be returned until the following business day.  We are closed weekends and major holidays. You have access to a nurse at all times for urgent questions. Please call the main number to the clinic Dept: [phone] and follow the prompts.   For any non-urgent questions, you may also contact your provider using MyChart. We now offer e-Visits for anyone 58 and older to request care online for non-urgent symptoms. For details visit [email or web address].   Also download the MyChart app! Go to the app store, search "MyChart", open the app, select , and log in with your MyChart username and password.`

## 2024-03-03 NOTE — Assessment & Plan Note (Addendum)
 Continue docetaxel 70mg /m2 Monitor toxicities. Neuropathy comes and goes Continue ADT at [facility].

## 2024-03-03 NOTE — Progress Notes (Signed)
 Subjective   Patient ID: Craig Collins, male    DOB: 1966/06/15, 58 y.o.   MRN: [id number]  Chief Complaint  Patient presents with   Medical Management of Chronic Issues    Referring provider: Ivonne Andrew, NP  [name] is a 58 y.o. male with Past Medical History: No date: Benign prostate hyperplasia No date: Elevated PSA No date: Erectile dysfunction No date: Hypertension No date: Obesity (BMI 35.0-39.9 without comorbidity) 05/2019: Prostate cancer (HCC) No date: Sleep apnea No date: Urinary retention   HPI  Patient presents today for follow-up visit. He does continue to follow with oncology for prostate cancer. Currently undergoing chemo. Is requesting medications for allergies. He is up-to-date on blood work through oncology.  Denies f/c/s, n/v/d, hemoptysis, PND, leg swelling. Denies chest pain or edema.      No Known Allergies  Immunization History  Administered Date(s) Administered   Influenza,inj,Quad PF,6+ Mos 10/08/2020   PFIZER(Purple Top)SARS-COV-2 Vaccination 03/06/2020, 04/19/2020, 11/13/2020    Tobacco History: Social History   Tobacco Use  Smoking Status Never   Passive exposure: Never  Smokeless Tobacco Never   Counseling given: Not Answered   Outpatient Encounter Medications as of 03/03/2024  Medication Sig   albuterol (VENTOLIN HFA) 108 (90 Base) MCG/ACT inhaler Inhale 2 puffs into the lungs every 6 (six) hours as needed for wheezing or shortness of breath.   ascorbic acid (VITAMIN C) 500 MG tablet Take 500 mg by mouth daily.   aspirin EC 81 MG tablet Take 81 mg by mouth daily.   Calcium Carbonate-Vitamin D (CALCIUM 500/D) 500-125 MG-UNIT TABS Take 1 tablet by mouth daily.   cetirizine (ZYRTEC) 10 MG tablet Take 1 tablet (10 mg total) by mouth daily.   cyanocobalamin 1000 MCG tablet Take 2 tablets (2,000 mcg total) by mouth daily.   cyclobenzaprine (FLEXERIL) 10 MG tablet Take 1 tablet (10 mg total) by mouth 3 (three) times daily  as needed for muscle spasms.   fluticasone (FLONASE) 50 MCG/ACT nasal spray Place 2 sprays into both nostrils daily.   gabapentin (NEURONTIN) 100 MG capsule Take 1-2 capsules (100-200 mg total) by mouth 2 (two) times daily as needed for neuropathy   hydrochlorothiazide (HYDRODIURIL) 25 MG tablet TAKE 1/2 TABLET (12.5 MG TOTAL) BY MOUTH DAILY.   lidocaine-prilocaine (EMLA) cream Apply to affected area once   lisinopril (ZESTRIL) 20 MG tablet Take 1 tablet (20 mg total) by mouth daily.   nystatin cream (MYCOSTATIN) Apply 1 Application topically 2 (two) times daily.   ondansetron (ZOFRAN) 8 MG tablet Take 1 tablet (8 mg total) by mouth every 8 (eight) hours as needed for nausea or vomiting.   predniSONE (DELTASONE) 5 MG tablet Take 1 tablet (5 mg total) by mouth in the morning and at bedtime.   prochlorperazine (COMPAZINE) 10 MG tablet Take 1 tablet (10 mg total) by mouth every 6 (six) hours as needed for nausea or vomiting.   sodium chloride (OCEAN) 0.65 % SOLN nasal spray Place 1 spray into both nostrils as needed for congestion.   tadalafil (CIALIS) 20 MG tablet Take 1 tablet (20 mg total) by mouth daily as needed.   tamsulosin (FLOMAX) 0.4 MG CAPS capsule Take 1 capsule (0.4 mg total) by mouth daily.   traMADol (ULTRAM) 50 MG tablet Take 1 - 2 tablets (50 - 100 mg total) by mouth every 6 hours as needed for moderate pain or severe pain.   VIAGRA 25 MG tablet Take 1 tablet (25 mg total)  by mouth daily as needed for erectile dysfunction.   No facility-administered encounter medications on file as of 03/03/2024.    Review of Systems  Review of Systems  Constitutional: Negative.   HENT: Negative.    Cardiovascular: Negative.   Gastrointestinal: Negative.   Allergic/Immunologic: Negative.   Neurological: Negative.   Psychiatric/Behavioral: Negative.       Objective:   BP 99/67   Pulse (!) 113   Temp 98.1 F (36.7 C) (Oral)   Wt 248 lb 3.2 oz (112.6 kg)   SpO2 99%   BMI 35.61 kg/m    Wt Readings from Last 5 Encounters:  03/03/24 248 lb 3.2 oz (112.6 kg)  02/11/24 245 lb (111.1 kg)  01/21/24 246 lb 4.8 oz (111.7 kg)  12/31/23 249 lb 9.6 oz (113.2 kg)  12/11/23 242 lb 9.6 oz (110 kg)     Physical Exam Vitals and nursing note reviewed.  Constitutional:      General: He is not in acute distress.    Appearance: He is well-developed.  Cardiovascular:     Rate and Rhythm: Normal rate and regular rhythm.  Pulmonary:     Effort: Pulmonary effort is normal.     Breath sounds: Normal breath sounds.  Skin:    General: Skin is warm and dry.  Neurological:     Mental Status: He is alert and oriented to person, place, and time.       Assessment & Plan:   Seasonal allergies -     Cetirizine HCl; Take 1 tablet (10 mg total) by mouth daily.  Dispense: 30 tablet; Refill: 11      Return in about 3 months (around 06/03/2024).   Ivonne Andrew, NP 03/03/2024

## 2024-03-03 NOTE — Assessment & Plan Note (Addendum)
 With history of bone metastases will obtain MRI of L hip and L spine

## 2024-03-03 NOTE — Assessment & Plan Note (Signed)
 continue gabapentin 100 mg twice daily

## 2024-03-04 ENCOUNTER — Other Ambulatory Visit: Payer: Self-pay

## 2024-03-04 DIAGNOSIS — R972 Elevated prostate specific antigen [PSA]: Secondary | ICD-10-CM

## 2024-03-04 DIAGNOSIS — Z192 Hormone resistant malignancy status: Secondary | ICD-10-CM

## 2024-03-04 LAB — TESTOSTERONE: Testosterone: 3 ng/dL — ABNORMAL LOW (ref 264–916)

## 2024-03-04 LAB — PROSTATE-SPECIFIC AG, SERUM (LABCORP): Prostate Specific Ag, Serum: 40.5 ng/mL — ABNORMAL HIGH (ref 0.0–4.0)

## 2024-03-05 ENCOUNTER — Inpatient Hospital Stay: Payer: Medicaid Other

## 2024-03-05 VITALS — BP 136/80 | HR 116 | Temp 97.5°F | Resp 18

## 2024-03-05 DIAGNOSIS — Z192 Hormone resistant malignancy status: Secondary | ICD-10-CM

## 2024-03-05 DIAGNOSIS — C7951 Secondary malignant neoplasm of bone: Secondary | ICD-10-CM | POA: Diagnosis not present

## 2024-03-05 DIAGNOSIS — C61 Malignant neoplasm of prostate: Secondary | ICD-10-CM | POA: Diagnosis not present

## 2024-03-05 DIAGNOSIS — Z5111 Encounter for antineoplastic chemotherapy: Secondary | ICD-10-CM | POA: Diagnosis not present

## 2024-03-05 MED ORDER — PEGFILGRASTIM-CBQV 6 MG/0.6ML ~~LOC~~ SOSY
6.0000 mg | PREFILLED_SYRINGE | Freq: Once | SUBCUTANEOUS | Status: AC
  Administered 2024-03-05: 6 mg via SUBCUTANEOUS
  Filled 2024-03-05: qty 0.6

## 2024-03-15 ENCOUNTER — Ambulatory Visit (HOSPITAL_COMMUNITY)
Admission: RE | Admit: 2024-03-15 | Discharge: 2024-03-15 | Disposition: A | Discharge status: Home / Self Care | Source: Ambulatory Visit

## 2024-03-15 ENCOUNTER — Ambulatory Visit (HOSPITAL_COMMUNITY)

## 2024-03-15 ENCOUNTER — Other Ambulatory Visit: Payer: Self-pay

## 2024-03-15 DIAGNOSIS — N400 Enlarged prostate without lower urinary tract symptoms: Secondary | ICD-10-CM | POA: Diagnosis not present

## 2024-03-15 DIAGNOSIS — Z192 Hormone resistant malignancy status: Secondary | ICD-10-CM | POA: Diagnosis not present

## 2024-03-15 DIAGNOSIS — M79605 Pain in left leg: Secondary | ICD-10-CM | POA: Diagnosis not present

## 2024-03-15 DIAGNOSIS — R609 Edema, unspecified: Secondary | ICD-10-CM | POA: Diagnosis not present

## 2024-03-15 DIAGNOSIS — M5126 Other intervertebral disc displacement, lumbar region: Secondary | ICD-10-CM | POA: Diagnosis not present

## 2024-03-15 DIAGNOSIS — C7951 Secondary malignant neoplasm of bone: Secondary | ICD-10-CM | POA: Diagnosis not present

## 2024-03-15 DIAGNOSIS — M5136 Other intervertebral disc degeneration, lumbar region with discogenic back pain only: Secondary | ICD-10-CM | POA: Diagnosis not present

## 2024-03-15 DIAGNOSIS — C61 Malignant neoplasm of prostate: Secondary | ICD-10-CM | POA: Diagnosis not present

## 2024-03-15 DIAGNOSIS — M545 Low back pain, unspecified: Secondary | ICD-10-CM | POA: Diagnosis not present

## 2024-03-15 DIAGNOSIS — C801 Malignant (primary) neoplasm, unspecified: Secondary | ICD-10-CM | POA: Diagnosis not present

## 2024-03-15 MED ORDER — GADOBUTROL 1 MMOL/ML IV SOLN
10.0000 mL | Freq: Once | INTRAVENOUS | Status: AC | PRN
  Administered 2024-03-15: 10 mL via INTRAVENOUS

## 2024-03-15 NOTE — Progress Notes (Signed)
 Patient reports pain over the left hip area, and lower back.  MRI reviewed reported large 7.5 cm size tumor over the left femur.  Also extensive disease in the lumbar spine.  Discussed consultation for palliative radiation.  We will hold chemo in the meantime.

## 2024-03-17 ENCOUNTER — Other Ambulatory Visit: Payer: Self-pay

## 2024-03-17 ENCOUNTER — Other Ambulatory Visit: Payer: Self-pay | Admitting: Nurse Practitioner

## 2024-03-17 DIAGNOSIS — I10 Essential (primary) hypertension: Secondary | ICD-10-CM

## 2024-03-17 DIAGNOSIS — R339 Retention of urine, unspecified: Secondary | ICD-10-CM

## 2024-03-17 DIAGNOSIS — R972 Elevated prostate specific antigen [PSA]: Secondary | ICD-10-CM

## 2024-03-17 DIAGNOSIS — N400 Enlarged prostate without lower urinary tract symptoms: Secondary | ICD-10-CM

## 2024-03-17 MED ORDER — HYDROCHLOROTHIAZIDE 25 MG PO TABS
ORAL_TABLET | ORAL | 3 refills | Status: AC
  Filled 2024-03-17: qty 45, 90d supply, fill #0
  Filled 2024-06-23: qty 45, 90d supply, fill #1
  Filled 2024-08-18 – 2024-11-22 (×3): qty 45, 90d supply, fill #2

## 2024-03-17 MED ORDER — TAMSULOSIN HCL 0.4 MG PO CAPS
0.4000 mg | ORAL_CAPSULE | Freq: Every day | ORAL | 3 refills | Status: DC
Start: 2024-03-17 — End: 2024-08-18
  Filled 2024-03-17: qty 30, 30d supply, fill #0
  Filled 2024-04-06 – 2024-04-08 (×2): qty 30, 30d supply, fill #1
  Filled 2024-05-20: qty 30, 30d supply, fill #2
  Filled 2024-07-06: qty 30, 30d supply, fill #3

## 2024-03-18 ENCOUNTER — Other Ambulatory Visit: Payer: Self-pay

## 2024-03-18 ENCOUNTER — Ambulatory Visit
Admission: RE | Admit: 2024-03-18 | Discharge: 2024-03-18 | Disposition: A | Discharge status: Home / Self Care | Source: Ambulatory Visit | Attending: Urology | Admitting: Urology

## 2024-03-18 ENCOUNTER — Ambulatory Visit
Admission: RE | Admit: 2024-03-18 | Discharge: 2024-03-18 | Disposition: A | Discharge status: Home / Self Care | Source: Ambulatory Visit | Attending: Radiation Oncology | Admitting: Radiation Oncology

## 2024-03-18 ENCOUNTER — Encounter: Payer: Self-pay | Admitting: Urology

## 2024-03-18 VITALS — BP 140/94 | HR 111 | Temp 97.1°F | Resp 18 | Ht 70.0 in | Wt 244.4 lb

## 2024-03-18 DIAGNOSIS — M47819 Spondylosis without myelopathy or radiculopathy, site unspecified: Secondary | ICD-10-CM | POA: Insufficient documentation

## 2024-03-18 DIAGNOSIS — C61 Malignant neoplasm of prostate: Secondary | ICD-10-CM | POA: Diagnosis not present

## 2024-03-18 DIAGNOSIS — Z192 Hormone resistant malignancy status: Secondary | ICD-10-CM

## 2024-03-18 DIAGNOSIS — Z7982 Long term (current) use of aspirin: Secondary | ICD-10-CM | POA: Insufficient documentation

## 2024-03-18 DIAGNOSIS — C7951 Secondary malignant neoplasm of bone: Secondary | ICD-10-CM

## 2024-03-18 DIAGNOSIS — G4730 Sleep apnea, unspecified: Secondary | ICD-10-CM | POA: Diagnosis not present

## 2024-03-18 DIAGNOSIS — Z79899 Other long term (current) drug therapy: Secondary | ICD-10-CM | POA: Insufficient documentation

## 2024-03-18 DIAGNOSIS — M2578 Osteophyte, vertebrae: Secondary | ICD-10-CM | POA: Diagnosis not present

## 2024-03-18 DIAGNOSIS — M51369 Other intervertebral disc degeneration, lumbar region without mention of lumbar back pain or lower extremity pain: Secondary | ICD-10-CM | POA: Insufficient documentation

## 2024-03-18 DIAGNOSIS — M5127 Other intervertebral disc displacement, lumbosacral region: Secondary | ICD-10-CM | POA: Diagnosis not present

## 2024-03-18 DIAGNOSIS — Z923 Personal history of irradiation: Secondary | ICD-10-CM | POA: Insufficient documentation

## 2024-03-18 DIAGNOSIS — Z7952 Long term (current) use of systemic steroids: Secondary | ICD-10-CM | POA: Insufficient documentation

## 2024-03-18 DIAGNOSIS — K209 Esophagitis, unspecified without bleeding: Secondary | ICD-10-CM | POA: Diagnosis not present

## 2024-03-18 NOTE — Progress Notes (Signed)
 Histology and Location of Primary Cancer: Prostate Ca metastatic  Sites of Visceral and Bony Metastatic Disease: Multiple areas left pelvis/left femur  Location(s) of Symptomatic Metastases:  Multiple areas left pelvis/left femur  03/15/2024 Dr. Geanie Berlin MR Lumbar Spine with/without Contrast CLINICAL DATA: Low back pain, cancer suspected. New left-sided pain   IMPRESSION: 1. Extensive metastatic disease to the lumbar spine and sacrum as outlined above. No evidence of acute worsening pathologic fracture.  Some loss of height at anterior L5 which appears chronic. No evidence of metastatic disease to the neural structures. 2. L4-5: Moderate disc bulge. Mild facet and ligamentous hypertrophy. Mild stenosis of the lateral recesses with some potential for neural compression on the right. 3. L5-S1: Endplate osteophytes and shallow protrusion of the disc. No compressive effect upon the thecal sac or nerve roots. No foraminal nerve root compression.    03/15/2024 Dr. Geanie Berlin MR HIP LEFT with/without Contrast CLINICAL DATA:  Prostate cancer, residual or recurrent disease suspected new left side pain with know metastases   IMPRESSION: 1. Numerous marrow replacing metastatic bone lesions throughout the pelvis, bilateral femurs, and included lower lumbar spine. 2. Large lesion within the intertrochanteric and subtrochanteric aspects of the left femur measures up to 7.5 cm in size and occupies the full width of the medullary space. There is no extraosseous soft tissue component or pathologic fracture of this lesion although this is at high risk for potential pathologic fracture. 3. Large lesion in the posterior left acetabulum and left ischium with cortical breakthrough and small amount of extraosseous tumor in the adjacent soft tissues. 4. Edema in the left quadratus femoris muscle, likely secondary to irritation from left ischial bone lesion. 5. Right common and external iliac chain  lymphadenopathy, as seen on prior PET-CT.    Past/Anticipated chemotherapy by medical oncology, if any:    Pain on a scale of 0-10 is:  2/10 left hip and lower back   If Spine Met(s), symptoms, if any, include: Bowel/Bladder retention or incontinence (please describe): No Numbness or weakness in extremities (please describe): Neuropathy hands and feet from chemo Current Decadron regimen, if applicable: No , prednisone  Ambulatory status? Walker? Wheelchair?:  Independent  SAFETY ISSUES: Prior radiation? Yes Pacemaker/ICD? No Possible current pregnancy? Male Is the patient on methotrexate? No  Current Complaints / other details:

## 2024-03-18 NOTE — Progress Notes (Signed)
 Docetaxel discontinue. Will proceed with palliative radiation and then evaluate for Pluvicto.

## 2024-03-18 NOTE — Progress Notes (Signed)
 Radiation Oncology         [phone] ________________________________  Outpatient Re-Consultation  Name: Craig Collins MRN: [id number]  Date of Service: 03/18/2024 DOB: 06-Dec-1966  WU:[id number], Gildardo Pounds, NP  Melven Sartorius, MD   REFERRING PHYSICIAN: Melven Sartorius, MD  DIAGNOSIS: 58 y/o man with painful bony metastases in the left and right hips and lumbar spine secondary to castrate resistant metastatic prostate cancer     ICD-10-CM   1. Secondary cancer of bone (HCC)  C79.51     2. Metastatic castration-resistant adenocarcinoma of prostate (HCC)  C61    Z19.2     3. Malignant neoplasm of prostate metastatic to bone [facility])  C61    C79.51       HISTORY OF PRESENT ILLNESS: Craig Collins is a 58 y.o. male seen at the request of Dr. Cherly Hensen. In summary, he was initially diagnosed with advanced, metastatic, Gleason 4+5 adenocarcinoma of the prostate with a PSA of 42 at the time of diagnosis in 06/11/2019 under the care of of Dr. Vernie Ammons. He was treated with ADT and had a good response with a PSA decrease to 2.77 in November 2021 and a restaging CT scan in February 2021 showing improved lymphadenopathy. His PSA began to rise, at 5.87 in February 2022 and up to 99.3 in November 2022 despite a testosterone level at 19. He was intermittently noncompliant with follow-up but did reestablish care with Dr. Mena Goes in November 2022 when his PSA was 99.3 and he got a 70-month ADT injection at that time. His PSA again decreased to 22.10 but he developed palpable, painful left supraclavicular lymphadenopathy. A PSMA PET scan was performed on 06/10/2022 showing residual prostate disease with intense activity within the gland and extending into the seminal vesicles, with bulky nodal metastases within the pelvis, periaortic, retroperitoneum and retrocrural space, lymphadenopathy in the mediastinum and bulky metastatic disease in the left supraclavicular nodal station as well as extensive activity in the  axial and appendicular skeleton with associated sclerotic changes on the CT portion of exam. He had previously been prescribed Xtandi but never started the medication due to lack of insurance coverage. He was referred to Dr. Clelia Croft for consult on 07/08/2022 and the recommendation was to start [facility] and consider palliative radiation to the painful sites of disease in the left chest wall/ribs and left supraclavicular region.   We met with him 07/10/22 and he was in agreement to proceed with the recommended 2-week course of daily palliative radiotherapy to the sites of painful metastases which was delivered between 07/16/2022 to 07/29/2022.  He was hospitalized for approximately 1 week after completing the radiation due to severe esophagitis but once that resolved he did have significant improvement in the treatment sites with excellent pain control.  He continued on the [facility] and ADT but unfortunately the PSA continued to rise so this was discontinued and he was started on docetaxel on 08/05/2023.  He has continued the docetaxel and is currently s/p 9 cycles.  The PSA initially responded well but has been increasing for the last 2 months at 15 in January 2025, 31 in February 2025 and most recently up to 40.5 on 03/03/2024, despite castrate T levels. He hada 6 month Lupron injection on 01/07/24 with Dr. Mena Goes at [facility]. A PSMA PET scan from January 2025 showed a partial response to treatment with no change in the number of skeletal mets but a decrease in tracer activity.  The skeletal lesions remain intensely avid but  less than that seen in July 2024.    He has been having some pain in the left hip and low back for the better part of a year but over the last month, the pain in the left hip has become persistent and progressively worsening, to the point that it is now interfering with his daily activities and wakes him from sleep at night.  He had an MRI of the lumbar spine and left hip on 03/15/2024.  The  lumbar spine MRI showed extensive metastatic disease throughout the lumbar spine and sacrum without any evidence of epidural disease.  The MRI of the left hip showed a 7.5 cm lesion occupying the full width of the medullary space within the intertrochanteric and subtrochanteric region of the left femur.  No pathologic fracture or extraosseous component the high risk for fracture.  There was also a large lesion in the posterior left acetabulum and left ischium with cortical breakthrough and a small amount of extraosseous tumor.  Multiple additional areas throughout the sacrum and pelvis and a 2.2 cm lesion within the anterior right femoral head/neck without fracture.  PREVIOUS RADIATION THERAPY: Yes   07/16/22 - 07/29/22:  The painful metastatic disease in the left chest wall and left supraclavicular/infraclavicular lymph nodes were treated to 30 Gy in 10 fractions of 3 Gy each  PAST MEDICAL HISTORY:  Past Medical History:  Diagnosis Date   Benign prostate hyperplasia    Elevated PSA    Erectile dysfunction    Hypertension    Obesity (BMI 35.0-39.9 without comorbidity)    Prostate cancer (HCC) 05/2019   Sleep apnea    Urinary retention       PAST SURGICAL HISTORY: Past Surgical History:  Procedure Laterality Date   COLONOSCOPY N/A 10/14/2017   Procedure: COLONOSCOPY;  Surgeon: Iva Boop, MD;  Location: [facility] ENDOSCOPY;  Service: Endoscopy;  Laterality: N/A;   ESOPHAGOGASTRODUODENOSCOPY (EGD) WITH PROPOFOL N/A 08/04/2022   Procedure: ESOPHAGOGASTRODUODENOSCOPY (EGD) WITH PROPOFOL;  Surgeon: Charna Elizabeth, MD;  Location: [facility] ENDOSCOPY;  Service: Gastroenterology;  Laterality: N/A;   IR IMAGING GUIDED PORT INSERTION  08/06/2023    FAMILY HISTORY:  Family History  Problem Relation Age of Onset   Leukemia Mother 40 - 59   Hypertension Brother    Prostate cancer Maternal Uncle    Prostate cancer Maternal Grandfather    Hypertension Other    Prostate cancer Cousin        maternal first  cousin   Colon cancer Neg Hx     SOCIAL HISTORY:  Social History   Socioeconomic History   Marital status: Married    Spouse name: Not on file   Number of children: 2   Years of education: Not on file   Highest education level: 12th grade  Occupational History   Not on file  Tobacco Use   Smoking status: Never    Passive exposure: Never   Smokeless tobacco: Never  Vaping Use   Vaping status: Never Used  Substance and Sexual Activity   Alcohol use: Not Currently   Drug use: No   Sexual activity: Yes    Birth control/protection: Condom  Other Topics Concern   Not on file  Social History Narrative   Not on file   Social Drivers of Health   Financial Resource Strain: Patient Declined (02/28/2024)   Overall Financial Resource Strain (CARDIA)    Difficulty of Paying Living Expenses: Patient declined  Food Insecurity: No Food Insecurity (03/18/2024)   Hunger Vital Sign  Worried About Programme researcher, broadcasting/film/video in the Last Year: Never true    Ran Out of Food in the Last Year: Never true  Recent Concern: Food Insecurity - Food Insecurity Present (02/28/2024)   Hunger Vital Sign    Worried About Running Out of Food in the Last Year: Sometimes true    Ran Out of Food in the Last Year: Patient declined  Transportation Needs: No Transportation Needs (02/28/2024)   PRAPARE - Administrator, Civil Service (Medical): No    Lack of Transportation (Non-Medical): No  Physical Activity: Insufficiently Active (02/28/2024)   Exercise Vital Sign    Days of Exercise per Week: 3 days    Minutes of Exercise per Session: 30 min  Stress: No Stress Concern Present (02/28/2024)   [employer] of Occupational Health - Occupational Stress Questionnaire    Feeling of Stress : Only a little  Social Connections: Moderately Isolated (02/28/2024)   Social Connection and Isolation Panel [NHANES]    Frequency of Communication with Friends and Family: More than three times a week    Frequency of  Social Gatherings with Friends and Family: Three times a week    Attends Religious Services: 1 to 4 times per year    Active Member of Clubs or Organizations: No    Attends Banker Meetings: Not on file    Marital Status: Separated  Intimate Partner Violence: Not At Risk (03/18/2024)   Humiliation, Afraid, Rape, and Kick questionnaire    Fear of Current or Ex-Partner: No    Emotionally Abused: No    Physically Abused: No    Sexually Abused: No    ALLERGIES: Patient has no known allergies.  MEDICATIONS:  Current Outpatient Medications  Medication Sig Dispense Refill   albuterol (VENTOLIN HFA) 108 (90 Base) MCG/ACT inhaler Inhale 2 puffs into the lungs every 6 (six) hours as needed for wheezing or shortness of breath. 18 g 6   ascorbic acid (VITAMIN C) 500 MG tablet Take 500 mg by mouth daily.     aspirin EC 81 MG tablet Take 81 mg by mouth daily.     Calcium Carbonate-Vitamin D (CALCIUM 500/D) 500-125 MG-UNIT TABS Take 1 tablet by mouth daily.     cetirizine (ZYRTEC) 10 MG tablet Take 1 tablet (10 mg total) by mouth daily. 30 tablet 11   cyanocobalamin 1000 MCG tablet Take 2 tablets (2,000 mcg total) by mouth daily. 30 tablet 0   cyclobenzaprine (FLEXERIL) 10 MG tablet Take 1 tablet (10 mg total) by mouth 3 (three) times daily as needed for muscle spasms. 30 tablet 0   fluticasone (FLONASE) 50 MCG/ACT nasal spray Place 2 sprays into both nostrils daily. 48 g 0   gabapentin (NEURONTIN) 100 MG capsule Take 1-2 capsules (100-200 mg total) by mouth 2 (two) times daily as needed for neuropathy 120 capsule 0   hydrochlorothiazide (HYDRODIURIL) 25 MG tablet TAKE 1/2 TABLET (12.5 MG TOTAL) BY MOUTH DAILY. 45 tablet 3   lidocaine-prilocaine (EMLA) cream Apply to affected area once 30 g 3   lisinopril (ZESTRIL) 20 MG tablet Take 1 tablet (20 mg total) by mouth daily. 90 tablet 3   nystatin cream (MYCOSTATIN) Apply 1 Application topically 2 (two) times daily. 30 g 1   ondansetron  (ZOFRAN) 8 MG tablet Take 1 tablet (8 mg total) by mouth every 8 (eight) hours as needed for nausea or vomiting. 30 tablet 1   predniSONE (DELTASONE) 5 MG tablet Take 1 tablet (5 mg  total) by mouth in the morning and at bedtime. 60 tablet 11   prochlorperazine (COMPAZINE) 10 MG tablet Take 1 tablet (10 mg total) by mouth every 6 (six) hours as needed for nausea or vomiting. 30 tablet 1   sodium chloride (OCEAN) 0.65 % SOLN nasal spray Place 1 spray into both nostrils as needed for congestion. 30 mL 0   tadalafil (CIALIS) 20 MG tablet Take 1 tablet (20 mg total) by mouth daily as needed. 10 tablet 11   tamsulosin (FLOMAX) 0.4 MG CAPS capsule Take 1 capsule (0.4 mg total) by mouth daily. 30 capsule 3   traMADol (ULTRAM) 50 MG tablet Take 1 - 2 tablets (50 - 100 mg total) by mouth every 6 hours as needed for moderate pain or severe pain. 90 tablet 0   VIAGRA 25 MG tablet Take 1 tablet (25 mg total) by mouth daily as needed for erectile dysfunction. 10 tablet 0   No current facility-administered medications for this encounter.    REVIEW OF SYSTEMS:  On review of systems, the patient reports that he is doing fair in general.  He denies any chest pain, shortness of breath, cough, fevers, chills, night sweats, or recent unintended weight changes.  He denies any bowel or bladder disturbances, and denies abdominal pain, nausea or vomiting.  He reports severe pain in the left hip that is persistent and requires pain medication around-the-clock in order to remain functional.  He also reports low back pain that occasionally radiates posteriorly down the back of his leg to his foot but he denies any associated paresthesias or focal weakness in the lower extremities.  He also reports occasional pain in the right hip that is becoming more noticeable but otherwise, he denies any new musculoskeletal or joint aches or pains. A complete review of systems is obtained and is otherwise negative.    PHYSICAL EXAM:  Wt  Readings from Last 3 Encounters:  03/18/24 244 lb 6 oz (110.8 kg)  03/03/24 248 lb 11.2 oz (112.8 kg)  03/03/24 248 lb 3.2 oz (112.6 kg)   Temp Readings from Last 3 Encounters:  03/18/24 (!) 97.1 F (36.2 C) (Temporal)  03/05/24 (!) 97.5 F (36.4 C) (Temporal)  03/03/24 97.7 F (36.5 C) (Temporal)   BP Readings from Last 3 Encounters:  03/18/24 (!) 140/94  03/05/24 136/80  03/03/24 123/72   Pulse Readings from Last 3 Encounters:  03/18/24 (!) 111  03/05/24 (!) 116  03/03/24 99   Pain Assessment Pain Score: 2  Pain Loc: Hip (left hip and lower back)/10  In general this is a well appearing African-American man in no acute distress.  He's alert and oriented x4 and appropriate throughout the examination. Cardiopulmonary assessment is negative for acute distress and he exhibits normal effort.   KPS = 80  100 - Normal; no complaints; no evidence of disease. 90   - Able to carry on normal activity; minor signs or symptoms of disease. 80   - Normal activity with effort; some signs or symptoms of disease. 47   - Cares for self; unable to carry on normal activity or to do active work. 60   - Requires occasional assistance, but is able to care for most of his personal needs. 50   - Requires considerable assistance and frequent medical care. 40   - Disabled; requires special care and assistance. 30   - Severely disabled; hospital admission is indicated although death not imminent. 20   - Very sick; hospital admission necessary;  active supportive treatment necessary. 10   - Moribund; fatal processes progressing rapidly. 0     - Dead  Karnofsky DA, Abelmann WH, Craver LS and [facility] [phone]) The use of the nitrogen mustards in the palliative treatment of carcinoma: with particular reference to bronchogenic carcinoma Cancer 1 634-56  LABORATORY DATA:  Lab Results  Component Value Date   WBC 9.5 03/03/2024   HGB 9.5 (L) 03/03/2024   HCT 29.9 (L) 03/03/2024   MCV 93.4 03/03/2024    PLT 234 03/03/2024   Lab Results  Component Value Date   NA 137 03/03/2024   K 3.9 03/03/2024   CL 104 03/03/2024   CO2 28 03/03/2024   Lab Results  Component Value Date   ALT 8 03/03/2024   AST 11 (L) 03/03/2024   ALKPHOS 195 (H) 03/03/2024   BILITOT 0.3 03/03/2024     RADIOGRAPHY: MR HIP LEFT W WO CONTRAST Result Date: 03/15/2024 CLINICAL DATA:  Prostate cancer, residual or recurrent disease suspected new left side pain with know metastases EXAM: MRI OF THE LEFT HIP WITHOUT AND WITH CONTRAST TECHNIQUE: Multiplanar, multisequence MR imaging was performed both before and after administration of intravenous contrast. CONTRAST:  10mL GADAVIST GADOBUTROL 1 MMOL/ML IV SOLN COMPARISON:  PET-CT 12/28/2023 FINDINGS: Bones/Joint/Cartilage Numerous marrow replacing metastatic bone lesions throughout the pelvis, bilateral femurs, and included lower lumbar spine. Large lesion within the intertrochanteric and subtrochanteric aspects of the left femur measures up to 7.5 cm in size and occupies the full width of the medullary space. There is no extraosseous soft tissue component or pathologic fracture of this lesion although this is at high risk for potential pathologic fracture. Large lesion in the posterior left acetabulum and left ischium with cortical breakthrough and small amount of extraosseous tumor in the adjacent soft tissues (series 25, image 17). There appears to be a small amount of extraosseous soft tissue component associated with large right iliac wing bone lesion. Multiple additional lesions throughout the sacrum and pelvis without pathologic fracture or soft tissue component. 2.2 cm lesion within the anterior aspect of the right femoral head/neck without pathologic fracture. Ligaments Intact. Muscles and Tendons Edema in the left quadratus femoris muscle, likely secondary to irritation from left ischial bone lesion. Soft tissues Swelling soft tissues within normal limits. No inguinal  lymphadenopathy. Right common and external iliac chain lymphadenopathy (series 19, image 28), as seen on prior PET-CT. Prostate gland is enlarged and heterogeneous. IMPRESSION: 1. Numerous marrow replacing metastatic bone lesions throughout the pelvis, bilateral femurs, and included lower lumbar spine. 2. Large lesion within the intertrochanteric and subtrochanteric aspects of the left femur measures up to 7.5 cm in size and occupies the full width of the medullary space. There is no extraosseous soft tissue component or pathologic fracture of this lesion although this is at high risk for potential pathologic fracture. 3. Large lesion in the posterior left acetabulum and left ischium with cortical breakthrough and small amount of extraosseous tumor in the adjacent soft tissues. 4. Edema in the left quadratus femoris muscle, likely secondary to irritation from left ischial bone lesion. 5. Right common and external iliac chain lymphadenopathy, as seen on prior PET-CT. These results will be called to the ordering clinician or representative by the Radiologist Assistant, and communication documented in the PACS or [employer]. Electronically Signed   By: Duanne Guess D.O.   On: 03/15/2024 15:34   MR Lumbar Spine W Wo Contrast Result Date: 03/15/2024 CLINICAL DATA:  Low back pain, cancer  suspected. New left-sided pain EXAM: MRI LUMBAR SPINE WITHOUT AND WITH CONTRAST TECHNIQUE: Multiplanar and multiecho pulse sequences of the lumbar spine were obtained without and with intravenous contrast. CONTRAST:  10mL GADAVIST GADOBUTROL 1 MMOL/ML IV SOLN COMPARISON:  PET scan 12/28/2023 FINDINGS: Segmentation:  5 lumbar type vertebral bodies. Alignment:  No malalignment. Vertebrae: T12: Metastatic disease in the left inferior corner of the vertebral body with extension to the proximal pedicle. L1: Metastatic disease in the right posterior vertebral body and right pedicle and diffusely through the posterior elements. L2:  Metastatic disease within the central right vertebral body. Focal involvement in the left posterior elements. L3: Negative for tumor. L4: Small foci of tumor within the vertebral body. L5: Mild chronic loss of height anteriorly. Tumor within the anterior vertebral body. Tumor in the pedicles and posterior elements more extensive on the right than the left. Sacrum: Extensive tumor infiltration within S1 and S2 with involvement of the sacral ala regions, left more extensive than right. Conus medullaris and cauda equina: Conus extends to the L1 level. Conus and cauda equina appear normal. No evidence of metastatic disease to the neural structures. Paraspinal and other soft tissues: Negative Disc levels: No significant disc level finding from T12-L1 through L2-3. L3-4: Mild disc bulge. Mild facet hypertrophy. No compressive stenosis. L4-5: Moderate disc bulge. Mild facet and ligamentous hypertrophy. Mild stenosis of the lateral recesses with some potential for neural compression on the right. L5-S1: Endplate osteophytes and shallow protrusion of the disc. No compressive effect upon the thecal sac or nerve roots. No foraminal nerve root compression. IMPRESSION: 1. Extensive metastatic disease to the lumbar spine and sacrum as outlined above. No evidence of acute worsening pathologic fracture. Some loss of height at anterior L5 which appears chronic. No evidence of metastatic disease to the neural structures. 2. L4-5: Moderate disc bulge. Mild facet and ligamentous hypertrophy. Mild stenosis of the lateral recesses with some potential for neural compression on the right. 3. L5-S1: Endplate osteophytes and shallow protrusion of the disc. No compressive effect upon the thecal sac or nerve roots. No foraminal nerve root compression. Electronically Signed   By: Paulina Fusi M.D.   On: 03/15/2024 14:17      IMPRESSION/PLAN: 1. 58 y.o. man with painful bony metastases in the left and right hips and lumbar spine secondary to  castrate resistant metastatic prostate cancer   Today, we talked to the patient and his wife about the findings and workup thus far. We discussed the natural history of metastatic prostate cancer and general treatment, highlighting the role of palliative radiotherapy in the management of painful osseous disease. We discussed the available radiation techniques, and focused on the details and logistics of delivery.  The recommendation is for a 2-week course of daily palliative radiotherapy to the lumbar spine, left hip and right hip.  We reviewed the anticipated acute and late sequelae associated with radiation in this setting. The patient was encouraged to ask questions that were answered to his stated satisfaction.  At the conclusion of our conversation, the patient elects to proceed with the recommended 2-week course of daily palliative radiotherapy to the lumbar spine, left hip and right hip.  He appears to have a good understanding of his disease and our treatment recommendations which are of palliative intent.  He has freely signed written consent to proceed today in the office and a copy of this document will be placed in his medical record.  We will share our discussion with Dr. Cherly Hensen and  proceed with CT simulation/treatment planning following our visit this morning, in anticipation of beginning his treatments on Monday, 03/21/2024.  He will resume his chemotherapy after completion of the radiotherapy.  He will also continue in routine follow-up under the care of direction of Dr. Mena Goes at [facility] for continued ADT.  His next Lupron injection will be due in July 2025.  We enjoyed meeting with him and his wife again today and look forward to continuing to participate in his care.  We personally spent 60 minutes in this encounter including chart review, reviewing radiological studies, meeting face-to-face with the patient, entering orders and completing documentation.    Marguarite Arbour,  PA-C    Margaretmary Dys, MD  [facility]  Radiation Oncology Direct Dial: [phone]  Fax: [phone] [email or web address]  Skype  [email or web address]

## 2024-03-18 NOTE — Progress Notes (Signed)
  Radiation Oncology         (336) [phone] ________________________________  Name: Craig Collins MRN: [id number]  Date: 03/18/2024  DOB: 1966-02-12  SIMULATION AND TREATMENT PLANNING NOTE    ICD-10-CM   1. Metastatic castration-resistant adenocarcinoma of prostate (HCC)  C61    Z19.2     2. Malignant neoplasm of prostate metastatic to bone [facility])  C61    C79.51       DIAGNOSIS:  58 y/o man with painful bony metastases in the left and right hips and lumbar spine secondary to castrate resistant metastatic prostate cancer   NARRATIVE:  The patient was brought to the CT Simulation planning suite.  Identity was confirmed.  All relevant records and images related to the planned course of therapy were reviewed.  The patient freely provided informed written consent to proceed with treatment after reviewing the details related to the planned course of therapy. The consent form was witnessed and verified by the simulation staff.  Then, the patient was set-up in a stable reproducible  supine position for radiation therapy.  CT images were obtained.  Surface markings were placed.  The CT images were loaded into the planning software.  Then the target and avoidance structures were contoured.  Treatment planning then occurred.  The radiation prescription was entered and confirmed.  Then, I designed and supervised the construction of a total of 3 medically necessary complex treatment devices consisting of leg positioner and MLC apertures to cover the treated hip and lumbar spine areas.  I have requested : 3D Simulation  I have requested a DVH of the following structures: *** Rectum, Bladder, femoral heads and target.  PLAN:  The targets in the lumbar spine, left hip and right hip will be treated to 30 Gy in 10 fractions.  ________________________________  Artist Pais Kathrynn Running, M.D.

## 2024-03-21 ENCOUNTER — Ambulatory Visit (HOSPITAL_COMMUNITY)
Admission: RE | Admit: 2024-03-21 | Discharge: 2024-03-21 | Disposition: A | Discharge status: Home / Self Care | Source: Ambulatory Visit

## 2024-03-21 DIAGNOSIS — C61 Malignant neoplasm of prostate: Secondary | ICD-10-CM | POA: Diagnosis not present

## 2024-03-21 DIAGNOSIS — C775 Secondary and unspecified malignant neoplasm of intrapelvic lymph nodes: Secondary | ICD-10-CM | POA: Diagnosis not present

## 2024-03-21 DIAGNOSIS — Z192 Hormone resistant malignancy status: Secondary | ICD-10-CM | POA: Insufficient documentation

## 2024-03-21 DIAGNOSIS — C7951 Secondary malignant neoplasm of bone: Secondary | ICD-10-CM | POA: Diagnosis not present

## 2024-03-21 DIAGNOSIS — C772 Secondary and unspecified malignant neoplasm of intra-abdominal lymph nodes: Secondary | ICD-10-CM | POA: Diagnosis not present

## 2024-03-21 MED ORDER — FLOTUFOLASTAT F 18 GALLIUM 296-5846 MBQ/ML IV SOLN
7.9700 | Freq: Once | INTRAVENOUS | Status: AC
  Administered 2024-03-21: 7.97 via INTRAVENOUS

## 2024-03-22 NOTE — Progress Notes (Unsigned)
 [facility] OFFICE PROGRESS NOTE  Patient Care Team: Ivonne Andrew, NP as PCP - General (Pulmonary Disease) Cherlyn Cushing, RN as Oncology Nurse Navigator Pickenpack-Cousar, Arty Baumgartner, NP as Nurse Practitioner (Hospice and Palliative Medicine) Malachy Mood, MD as Consulting Physician (Hematology and Oncology)  58 y.o. m. With history of HTN, OSA prostate cancer here for follow up.  Currently undergoing treatment for mCRPC.   Current diagnosis: mCRPC with bone metastases Initial diagnosis: GS 4+5 GG5 PSA 42 with bone metastases Germline testing: [employer] genetics BRIP1 p. [id number] Variant, Likely Benign Somatic testing: negative for HRRm, neg for dMMR. TMB 1 Muts/Mb Previous treatment: ADT (at [facility]), ARPI (was given apalutamide but never started, enza 06/2022-08/2023) PSA nadir 12/16/22 at 0.3   Current treatment: 08/05/23 - current: docetaxel. Completed 9 cycles at reduced dose. Better tolerance on current dose.   Now having progression of rising PSA and PSMA PET showed disease progression.  Plan for palliative radiation.  Afterwards, will refer for Pluvicto.  Discussed with patient today. Assessment & Plan   No orders of the defined types were placed in this encounter.    Melven Sartorius, MD  INTERVAL HISTORY: he returns for treatment follow-up Complications related to previous cycle of chemotherapy included {NG Chemo Complications (Optional):32385}  Oncology History Overview Note   Cancer Staging  Prostate cancer [facility]) Staging form: Prostate, AJCC 8th Edition - Clinical: Stage IVB (cTX, cNX, pM1b, PSA: 42, Grade Group: 5) - Signed by Benjiman Core, MD on 07/08/2022 Prostate specific antigen (PSA) range: 20 or greater Histologic grading system: 5 grade system     Prostate cancer (HCC) (Resolved)  12/21/2019 Initial Diagnosis   Prostate cancer (HCC)   07/08/2022 Cancer Staging   Staging form: Prostate, AJCC 8th Edition - Clinical: Stage IVB (cTX, cNX,  pM1b, PSA: 42, Grade Group: 5) - Signed by Benjiman Core, MD on 07/08/2022 Prostate specific antigen (PSA) range: 20 or greater Histologic grading system: 5 grade system   Metastasis to bone (HCC) (Resolved)  07/08/2022 Initial Diagnosis   Metastasis to bone (HCC)   07/16/2023 PET scan    IMPRESSION: 1. Mixed response to therapy. 2. Interval decrease in size of the prostate gland with mild residual tracer uptake within the right posterior gland. 3. Interval decrease in size of thoracic and abdominal adenopathy. 4. Persistent, multifocal tracer avid bone metastases. Overall, the tracer avid lesions appear increased in size and degree of tracer uptake. Lesion within the right calvarium has resolved in the interval.    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 Partial response to therapy.  No disease progression. 2. Decreased size and radiotracer activity of RIGHT iliac and retroperitoneal adenopathy. 3. No change in number or pattern of metastatic skeletal disease; however, significant interval decrease in radiotracer activity and increased sclerosis. Lesions remain intensely radiotracer avid but again decreased in avidity 4. No evidence of visceral metastasis or pulmonary metastasis.   01/20/2024 Cancer Staging   Staging form: Prostate, AJCC 8th Edition - Clinical: Stage IVB (cTX, cN1, pM1b) - Signed by Melven Sartorius, MD on 01/20/2024      PHYSICAL EXAMINATION: ECOG PERFORMANCE STATUS: {CHL ONC ECOG EA:[id number]}  There were no vitals filed for this visit. There were no vitals filed for this visit.  Relevant data reviewed during this visit included ***

## 2024-03-22 NOTE — Assessment & Plan Note (Signed)
 Stop docetaxel Plan for a 2-week course of daily palliative radiotherapy to the lumbar spine, left hip and right hip. Will complete on 4/18 Referral for Pluvicto after radiation Continue ADT at [facility].

## 2024-03-23 ENCOUNTER — Other Ambulatory Visit: Payer: Self-pay

## 2024-03-24 ENCOUNTER — Other Ambulatory Visit: Payer: Self-pay

## 2024-03-24 ENCOUNTER — Inpatient Hospital Stay (HOSPITAL_BASED_OUTPATIENT_CLINIC_OR_DEPARTMENT_OTHER): Payer: Medicaid Other | Admitting: Nurse Practitioner

## 2024-03-24 ENCOUNTER — Encounter: Payer: Self-pay | Admitting: Hematology

## 2024-03-24 ENCOUNTER — Inpatient Hospital Stay: Payer: Medicaid Other

## 2024-03-24 ENCOUNTER — Inpatient Hospital Stay: Payer: Medicaid Other | Attending: Nurse Practitioner

## 2024-03-24 ENCOUNTER — Ambulatory Visit (HOSPITAL_COMMUNITY)
Admission: RE | Admit: 2024-03-24 | Discharge: 2024-03-24 | Disposition: A | Discharge status: Home / Self Care | Source: Ambulatory Visit

## 2024-03-24 ENCOUNTER — Encounter: Payer: Self-pay | Admitting: Nurse Practitioner

## 2024-03-24 ENCOUNTER — Ambulatory Visit: Payer: Medicaid Other

## 2024-03-24 ENCOUNTER — Other Ambulatory Visit: Payer: Self-pay | Admitting: *Deleted

## 2024-03-24 VITALS — BP 121/70 | HR 60 | Temp 98.2°F | Resp 13 | Wt 243.0 lb

## 2024-03-24 DIAGNOSIS — G629 Polyneuropathy, unspecified: Secondary | ICD-10-CM

## 2024-03-24 DIAGNOSIS — Z9189 Other specified personal risk factors, not elsewhere classified: Secondary | ICD-10-CM | POA: Diagnosis not present

## 2024-03-24 DIAGNOSIS — Z515 Encounter for palliative care: Secondary | ICD-10-CM

## 2024-03-24 DIAGNOSIS — Z192 Hormone resistant malignancy status: Secondary | ICD-10-CM

## 2024-03-24 DIAGNOSIS — G893 Neoplasm related pain (acute) (chronic): Secondary | ICD-10-CM | POA: Diagnosis not present

## 2024-03-24 DIAGNOSIS — C779 Secondary and unspecified malignant neoplasm of lymph node, unspecified: Secondary | ICD-10-CM | POA: Diagnosis not present

## 2024-03-24 DIAGNOSIS — D649 Anemia, unspecified: Secondary | ICD-10-CM

## 2024-03-24 DIAGNOSIS — Z79899 Other long term (current) drug therapy: Secondary | ICD-10-CM | POA: Diagnosis not present

## 2024-03-24 DIAGNOSIS — C7951 Secondary malignant neoplasm of bone: Secondary | ICD-10-CM | POA: Diagnosis not present

## 2024-03-24 DIAGNOSIS — C61 Malignant neoplasm of prostate: Secondary | ICD-10-CM

## 2024-03-24 DIAGNOSIS — Z95828 Presence of other vascular implants and grafts: Secondary | ICD-10-CM

## 2024-03-24 DIAGNOSIS — Z7189 Other specified counseling: Secondary | ICD-10-CM | POA: Diagnosis not present

## 2024-03-24 DIAGNOSIS — G4730 Sleep apnea, unspecified: Secondary | ICD-10-CM | POA: Diagnosis not present

## 2024-03-24 LAB — CMP (CANCER CENTER ONLY)
ALT: 7 U/L (ref 0–44)
AST: 11 U/L — ABNORMAL LOW (ref 15–41)
Albumin: 4 g/dL (ref 3.5–5.0)
Alkaline Phosphatase: 187 U/L — ABNORMAL HIGH (ref 38–126)
Anion gap: 10 (ref 5–15)
BUN: 15 mg/dL (ref 6–20)
CO2: 25 mmol/L (ref 22–32)
Calcium: 9.7 mg/dL (ref 8.9–10.3)
Chloride: 103 mmol/L (ref 98–111)
Creatinine: 1.2 mg/dL (ref 0.61–1.24)
GFR, Estimated: >60 mL/min (ref >60)
Glucose, Bld: 104 mg/dL — ABNORMAL HIGH (ref 70–99)
Potassium: 3.8 mmol/L (ref 3.5–5.1)
Sodium: 138 mmol/L (ref 135–145)
Total Bilirubin: 0.4 mg/dL (ref 0.0–1.2)
Total Protein: 7.6 g/dL (ref 6.5–8.1)

## 2024-03-24 LAB — CBC WITH DIFFERENTIAL (CANCER CENTER ONLY)
Abs Immature Granulocytes: 0.04 10*3/uL (ref 0.00–0.07)
Basophils Absolute: 0 10*3/uL (ref 0.0–0.1)
Basophils Relative: 0 %
Eosinophils Absolute: 0 10*3/uL (ref 0.0–0.5)
Eosinophils Relative: 0 %
HCT: 29.4 % — ABNORMAL LOW (ref 39.0–52.0)
Hemoglobin: 9.6 g/dL — ABNORMAL LOW (ref 13.0–17.0)
Immature Granulocytes: 0 %
Lymphocytes Relative: 10 %
Lymphs Abs: 0.9 10*3/uL (ref 0.7–4.0)
MCH: 29.9 pg (ref 26.0–34.0)
MCHC: 32.7 g/dL (ref 30.0–36.0)
MCV: 91.6 fL (ref 80.0–100.0)
Monocytes Absolute: 1 10*3/uL (ref 0.1–1.0)
Monocytes Relative: 11 %
Neutro Abs: 7.2 10*3/uL (ref 1.7–7.7)
Neutrophils Relative %: 79 %
Platelet Count: 313 10*3/uL (ref 150–400)
RBC: 3.21 MIL/uL — ABNORMAL LOW (ref 4.22–5.81)
RDW: 14.4 % (ref 11.5–15.5)
WBC Count: 9.2 10*3/uL (ref 4.0–10.5)
nRBC: 0 % (ref 0.0–0.2)

## 2024-03-24 MED ORDER — HEPARIN SOD (PORK) LOCK FLUSH 100 UNIT/ML IV SOLN
500.0000 [IU] | Freq: Once | INTRAVENOUS | Status: AC
  Administered 2024-03-24: 500 [IU]

## 2024-03-24 MED ORDER — SODIUM CHLORIDE 0.9% FLUSH
10.0000 mL | Freq: Once | INTRAVENOUS | Status: AC
  Administered 2024-03-24: 10 mL

## 2024-03-24 MED ORDER — METOCLOPRAMIDE HCL 10 MG PO TABS
10.0000 mg | ORAL_TABLET | Freq: Four times a day (QID) | ORAL | 3 refills | Status: AC | PRN
  Filled 2024-03-24: qty 90, 23d supply, fill #0

## 2024-03-24 MED ORDER — XTAMPZA ER 9 MG PO C12A
9.0000 mg | EXTENDED_RELEASE_CAPSULE | Freq: Two times a day (BID) | ORAL | 0 refills | Status: DC
Start: 2024-03-24 — End: 2024-06-15
  Filled 2024-03-24: qty 60, 30d supply, fill #0

## 2024-03-24 NOTE — Assessment & Plan Note (Addendum)
 continue gabapentin 100 mg twice daily

## 2024-03-24 NOTE — Assessment & Plan Note (Addendum)
 Okay to take Tylenol 650 mg twice daily as needed.  He knows the maximum dose limit. Follow-up with palliative care NP [facility]

## 2024-03-24 NOTE — Consult Note (Signed)
 Chief Complaint: Patient with metastatic castrate resistant prostate cancer. Evaluation for   Lu 177 PSMA therapy (Pluvicto).  Referring Physician(s):Cheng    Patient Status: [facility] - Out-pt  History of Present Illness: BURLON CENTRELLA is a 58 y.o. male with castrate resistant metastatic prostate adenocarcinoma.  Initial diagnosis in 2018 with Gleason 4+5=9 prostate adenocarcinoma.  Subsequent androgen deprivation within nadir of PSA equal 0.3.  Progression to bone metastasis.     Patient status post 11 cycles taxane chemotherapy.  Most recent PSMA PET scan demonstrated progression of skeletal metastasis and nodal metastasis.  PSA slowly equal 40.       Past Medical History:  Diagnosis Date   Benign prostate hyperplasia    Elevated PSA    Erectile dysfunction    Hypertension    Obesity (BMI 35.0-39.9 without comorbidity)    Prostate cancer (HCC) 05/2019   Sleep apnea    Urinary retention     Past Surgical History:  Procedure Laterality Date   COLONOSCOPY N/A 10/14/2017   Procedure: COLONOSCOPY;  Surgeon: Iva Boop, MD;  Location[facility] ENDOSCOPY;  Service: Endoscopy;  Laterality: N/A;   ESOPHAGOGASTRODUODENOSCOPY (EGD) WITH PROPOFOL N/A 08/04/2022   Procedure: ESOPHAGOGASTRODUODENOSCOPY (EGD) WITH PROPOFOL;  Surgeon: Charna Elizabeth, MD;  Location: [facility] ENDOSCOPY;  Service: Gastroenterology;  Laterality: N/A;   IR IMAGING GUIDED PORT INSERTION  08/06/2023    Allergies: Patient has no known allergies.  Medications: Prior to Admission medications   Medication Sig Start Date End Date Taking? Authorizing Provider  albuterol (VENTOLIN HFA) 108 (90 Base) MCG/ACT inhaler Inhale 2 puffs into the lungs every 6 (six) hours as needed for wheezing or shortness of breath. 03/03/23   Omar Person, MD  ascorbic acid (VITAMIN C) 500 MG tablet Take 500 mg by mouth daily.    [provider]  aspirin EC 81 MG tablet Take 81 mg by mouth daily.    [provider]   Calcium Carbonate-Vitamin D (CALCIUM 500/D) 500-125 MG-UNIT TABS Take 1 tablet by mouth daily.    [provider]  cetirizine (ZYRTEC) 10 MG tablet Take 1 tablet (10 mg total) by mouth daily. 03/03/24   Ivonne Andrew, NP  cyanocobalamin 1000 MCG tablet Take 2 tablets (2,000 mcg total) by mouth daily. 06/17/23   Pickenpack-Cousar, Arty Baumgartner, NP  cyclobenzaprine (FLEXERIL) 10 MG tablet Take 1 tablet (10 mg total) by mouth 3 (three) times daily as needed for muscle spasms. 01/27/24   Carlean Jews, NP  fluticasone (FLONASE) 50 MCG/ACT nasal spray Place 2 sprays into both nostrils daily. 02/17/24   Ivonne Andrew, NP  gabapentin (NEURONTIN) 100 MG capsule Take 1-2 capsules (100-200 mg total) by mouth 2 (two) times daily as needed for neuropathy 02/17/24   Malachy Mood, MD  hydrochlorothiazide (HYDRODIURIL) 25 MG tablet TAKE 1/2 TABLET (12.5 MG TOTAL) BY MOUTH DAILY. 03/17/24   Ivonne Andrew, NP  lisinopril (ZESTRIL) 20 MG tablet Take 1 tablet (20 mg total) by mouth daily. 09/04/23   Ivonne Andrew, NP  metoCLOPramide (REGLAN) 10 MG tablet Take 1 tablet (10 mg total) by mouth every 6 (six) hours as needed for nausea. 03/24/24   Pickenpack-Cousar, Arty Baumgartner, NP  nystatin cream (MYCOSTATIN) Apply 1 Application topically 2 (two) times daily. 10/07/23   Carlean Jews, NP  oxyCODONE ER (XTAMPZA ER) 9 MG C12A Take 9 mg by mouth every 12 (twelve) hours. 03/24/24   Pickenpack-Cousar, Arty Baumgartner, NP  sodium chloride (OCEAN) 0.65 % SOLN  nasal spray Place 1 spray into both nostrils as needed for congestion. 01/19/23   Donell Beers, FNP  tadalafil (CIALIS) 20 MG tablet Take 1 tablet (20 mg total) by mouth daily as needed. 03/18/23     tamsulosin (FLOMAX) 0.4 MG CAPS capsule Take 1 capsule (0.4 mg total) by mouth daily. 03/17/24   Ivonne Andrew, NP  traMADol (ULTRAM) 50 MG tablet Take 1 - 2 tablets (50 - 100 mg total) by mouth every 6 hours as needed for moderate pain or severe pain. 11/18/23    Pickenpack-Cousar, Arty Baumgartner, NP  VIAGRA 25 MG tablet Take 1 tablet (25 mg total) by mouth daily as needed for erectile dysfunction. 10/22/22   Ivonne Andrew, NP     Family History  Problem Relation Age of Onset   Leukemia Mother 20 - 90   Hypertension Brother    Prostate cancer Maternal Uncle    Prostate cancer Maternal Grandfather    Hypertension Other    Prostate cancer Cousin        maternal first cousin   Colon cancer Neg Hx     Social History   Socioeconomic History   Marital status: Married    Spouse name: Not on file   Number of children: 2   Years of education: Not on file   Highest education level: 12th grade  Occupational History   Not on file  Tobacco Use   Smoking status: Never    Passive exposure: Never   Smokeless tobacco: Never  Vaping Use   Vaping status: Never Used  Substance and Sexual Activity   Alcohol use: Not Currently   Drug use: No   Sexual activity: Yes    Birth control/protection: Condom  Other Topics Concern   Not on file  Social History Narrative   Not on file   Social Drivers of Health   Financial Resource Strain: Patient Declined (02/28/2024)   Overall Financial Resource Strain (CARDIA)    Difficulty of Paying Living Expenses: Patient declined  Food Insecurity: No Food Insecurity (03/18/2024)   Hunger Vital Sign    Worried About Running Out of Food in the Last Year: Never true    Ran Out of Food in the Last Year: Never true  Recent Concern: Food Insecurity - Food Insecurity Present (02/28/2024)   Hunger Vital Sign    Worried About Running Out of Food in the Last Year: Sometimes true    Ran Out of Food in the Last Year: Patient declined  Transportation Needs: No Transportation Needs (02/28/2024)   PRAPARE - Administrator, Civil Service (Medical): No    Lack of Transportation (Non-Medical): No  Physical Activity: Insufficiently Active (02/28/2024)   Exercise Vital Sign    Days of Exercise per Week: 3 days    Minutes of  Exercise per Session: 30 min  Stress: No Stress Concern Present (02/28/2024)   [employer] of Occupational Health - Occupational Stress Questionnaire    Feeling of Stress : Only a little  Social Connections: Moderately Isolated (02/28/2024)   Social Connection and Isolation Panel [NHANES]    Frequency of Communication with Friends and Family: More than three times a week    Frequency of Social Gatherings with Friends and Family: Three times a week    Attends Religious Services: 1 to 4 times per year    Active Member of Clubs or Organizations: No    Attends Banker Meetings: Not on file    Marital Status:  Separated    ECOG Status: 1 - Symptomatic but completely ambulatory  Review of Systems: A 12 point ROS discussed and pertinent positives are indicated in the HPI above.  All other systems are negative.  Review of Systems Patient experiencing significant LEFT hip pain.  Hip pain control with Tylenol.   Hot flashes related to hormone therapy.]   Vital Signs: There were no vitals taken for this visit.  Physical Exam  [Well-appearing 58 year old male.   Imaging: PSMA PET scan: 03/21/24 1. Overall widespread skeletal radiotracer avid metastasis and lymphadenopathy is similar to prior with evidence of mild interval progression. 2. Mild progression of metastatic adenopathy in the retroperitoneum and LEFT supraclavicular nodal stations. Extensive pelvic metastatic adenopathy similar prior. 3. Widespread skeletal distal adjacent skeletal metastasis with increased in lesions within the thoracic spine.   Labs: Recent Labs    12/31/23 1021 01/21/24 1145 02/11/24 1125 03/03/24 1139  PSA1 15.2* 31.5* 31.3* 40.5*    CBC: Recent Labs    01/21/24 1145 02/11/24 1125 03/03/24 1139 03/24/24 0804  WBC 6.5 6.6 9.5 9.2  HGB 10.5* 10.4* 9.5* 9.6*  HCT 32.8* 32.6* 29.9* 29.4*  PLT 256 267 234 313    COAGS: No results for input(s): "INR", "APTT" in the last 8760  hours.  BMP: Recent Labs    01/21/24 1145 02/11/24 1125 03/03/24 1139 03/24/24 0804  NA 138 139 137 138  K 3.7 3.5 3.9 3.8  CL 105 105 104 103  CO2 25 27 28 25   GLUCOSE 97 94 108* 104*  BUN 31* 18 22* 15  CALCIUM 9.7 9.8 9.3 9.7  CREATININE 1.26* 1.08 1.17 1.20  GFRNONAA >60 >60 >60 >60    LIVER FUNCTION TESTS: Recent Labs    01/21/24 1145 02/11/24 1125 03/03/24 1139 03/24/24 0804  BILITOT 0.4 0.4 0.3 0.4  AST 12* 9* 11* 11*  ALT 8 7 8 7   ALKPHOS 239* 271* 195* 187*  PROT 7.5 7.2 7.3 7.6  ALBUMIN 4.1 4.0 4.1 4.0    TUMOR MARKERS: PSA = 40  Assessment and Plan:  [Patient is good candidate Lu 177 PSMA therapy ( vipivotide tetraxetan).  Patient demonstrates wide spread skeletal metastasis with mild progression metastatic lymphadenopathy and skeletal disease identified on recent PSMA PET scan.  Additionally patient's PSA is gradually increasing.  Patient has demonstrated progression on androgen deprivation and taxane chemotherapy.   Patient explained major and minor risks and benefits of therapy.  Major benefit being progression-free survival.  Major risk being myelosuppression and renal toxicity. Minor toxicity of xerostomia.  All the patient's questions were answered.  Patient accompanied by wife who was also present for consult.    Patient is scheduled for 6 treatments spaced 6 weeks apart.  Recommend following up with oncologist for CBC and CMP 1 week prior to each treatment to assess safety of continuing with therapy.      Thank you for this interesting consult.  I greatly enjoyed meeting RAQUAN IANNONE and look forward to participating in their care.  A copy of this report was sent to the requesting provider on this date.  Electronically Signed: Patriciaann Clan, MD 03/24/2024, 2:01 PM   I spent a total of  30 Minutes   in face to face in clinical consultation, greater than 50% of which was counseling/coordinating care for metastatic neuroendocrine  tumor.

## 2024-03-24 NOTE — Progress Notes (Signed)
 Palliative Medicine [facility]  [phone] [phone]   Name: Craig Collins Date: 03/24/2024 MRN: [id number]  DOB: 08/24/1966  Patient Care Team: Ivonne Andrew, NP as PCP - General (Pulmonary Disease) Cherlyn Cushing, RN as Oncology Nurse Navigator Pickenpack-Cousar, Arty Baumgartner, NP as Nurse Practitioner (Hospice and Palliative Medicine) Malachy Mood, MD as Consulting Physician (Hematology and Oncology)    INTERVAL HISTORY: Craig Collins is a 58 y.o. 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 Palliative ask to see for symptom and pain management and goals of care.   SOCIAL HISTORY:     reports that he has never smoked. He has never been exposed to tobacco smoke. He has never used smokeless tobacco. He reports that he does not currently use alcohol. He reports that he does not use drugs.  ADVANCE DIRECTIVES:  None on file   CODE STATUS: Full code  PAST MEDICAL HISTORY: Past Medical History:  Diagnosis Date   Benign prostate hyperplasia    Elevated PSA    Erectile dysfunction    Hypertension    Obesity (BMI 35.0-39.9 without comorbidity)    Prostate cancer (HCC) 05/2019   Sleep apnea    Urinary retention     ALLERGIES:  has no known allergies.  MEDICATIONS:  Current Outpatient Medications  Medication Sig Dispense Refill   metoCLOPramide (REGLAN) 10 MG tablet Take 1 tablet (10 mg total) by mouth every 6 (six) hours as needed for nausea. 90 tablet 3   oxyCODONE ER (XTAMPZA ER) 9 MG C12A Take 9 mg by mouth every 12 (twelve) hours. 60 capsule 0   albuterol (VENTOLIN HFA) 108 (90 Base) MCG/ACT inhaler Inhale 2 puffs into the lungs every 6 (six) hours as needed for wheezing or shortness of breath. 18 g 6   ascorbic acid (VITAMIN C) 500 MG tablet Take 500 mg by mouth daily.      aspirin EC 81 MG tablet Take 81 mg by mouth daily.     Calcium Carbonate-Vitamin D (CALCIUM 500/D) 500-125 MG-UNIT TABS Take 1 tablet by mouth daily.     cetirizine (ZYRTEC) 10 MG tablet Take 1 tablet (10 mg total) by mouth daily. 30 tablet 11   cyanocobalamin 1000 MCG tablet Take 2 tablets (2,000 mcg total) by mouth daily. 30 tablet 0   cyclobenzaprine (FLEXERIL) 10 MG tablet Take 1 tablet (10 mg total) by mouth 3 (three) times daily as needed for muscle spasms. 30 tablet 0   fluticasone (FLONASE) 50 MCG/ACT nasal spray Place 2 sprays into both nostrils daily. 48 g 0   gabapentin (NEURONTIN) 100 MG capsule Take 1-2 capsules (100-200 mg total) by mouth 2 (two) times daily as needed for neuropathy 120 capsule 0   hydrochlorothiazide (HYDRODIURIL) 25 MG tablet TAKE 1/2 TABLET (12.5 MG TOTAL) BY MOUTH DAILY. 45 tablet 3   lisinopril (ZESTRIL) 20 MG tablet Take 1 tablet (20 mg total) by mouth daily. 90 tablet 3   nystatin cream (MYCOSTATIN) Apply 1 Application topically 2 (two) times daily. 30 g 1   sodium chloride (OCEAN) 0.65 % SOLN nasal spray Place 1 spray into both nostrils as needed for congestion. 30 mL 0   tadalafil (CIALIS) 20 MG tablet Take 1 tablet (20 mg total) by mouth daily as needed. 10 tablet 11   tamsulosin (FLOMAX) 0.4 MG CAPS capsule Take 1 capsule (0.4 mg total) by mouth  daily. 30 capsule 3   traMADol (ULTRAM) 50 MG tablet Take 1 - 2 tablets (50 - 100 mg total) by mouth every 6 hours as needed for moderate pain or severe pain. 90 tablet 0   VIAGRA 25 MG tablet Take 1 tablet (25 mg total) by mouth daily as needed for erectile dysfunction. 10 tablet 0   No current facility-administered medications for this visit.    VITAL SIGNS: There were no vitals taken for this visit. There were no vitals filed for this visit.  Estimated body mass index is 34.87 kg/m as calculated from the following:   Height as of 03/18/24: 5\' 10"  (1.778 m).   Weight as of an earlier encounter on 03/24/24: 243  lb (110.2 kg).   PERFORMANCE STATUS (ECOG) : 1 - Symptomatic but completely ambulatory  Physical Exam General: NAD Cardiovascular: regular rate and rhythm Pulmonary: normal breathing pattern Extremities: no edema, no joint deformities Skin: no rashes Neurological: AAO x3  IMPRESSION: Discussed the use of AI scribe software for clinical note transcription with the patient, who gave verbal consent to proceed.  History of Present Illness Craig Collins is a 58 year old male who presents for pain management and symptom control. No acute distress. No family present. Denies concerns with nausea, vomiting, constipation, or diarrhea. He takes Miralax two to three times a week to manage constipation, which he reports is effective. Appetite continues to be well. Weight stable at 243lbs. He is trying to remain as active as possible. He was seen by Dr. Cherly Hensen today. Patient is scheduled to start radiation next week to his left femur and spine. In addition potential changes to oncology regimen due to some progression.   He experiences significant hot flashes, particularly at night, which disrupt his sleep. He describes them as 'terrible' and notes that they are worse when he is sleeping, sometimes causing him to sweat profusely, which concerns his family members. He also experiences chills during the day, requiring him to put on additional clothing attributed to his random hot flashes.  [name] reports that his pain overall is controlled. He manages with Tylenol a couple of times a week, finding it effective. When the pain becomes severe, he takes two tramadol tablets, which cause a loopy feeling' and make him feel 'off.' He is trying to minimize use as much as possible however does feel that when pain is severe Tramadol helps. In the past he has tried [place], a slow-release medication, which he prefers and finds effective when taken with Tylenol. He is requesting a refill. He continues to take  gabapentin, two tablets in the morning and two at night. We discussed continuing with current regimen.   He experiences gastrointestinal discomfort, feeling full quickly and sometimes experiencing muscle spasms in his stomach area, and as if there is a knot to abdominal area. He occasionally takes Mylanta for relief when symptoms become severe. Education provided on use, efficacy,and potential side effects of Reglan.   Goals of Care  Mr. Maldonado is realistic in his understanding of current cancer state and expectations. He continues to remain hopeful for stability. Goals remain clear to continue to treat the treatable allowing him every opportunity to continue thriving. Relies on his Ephri[employer] Knuckles faith.   04/16/23-We discussed Mr. Azeez current illness and what it means in the larger context of his on-going co-morbidities. Natural disease trajectory and expectations were discussed. We discussed the importance of continued conversation with family and their medical providers regarding overall plan of care  and treatment options, ensuring decisions are within the context of the patients values and GOCs.   Mr. Speckman relates that it has become more difficult for him to leave home, mostly due to the pain and fatigue. There are other factors as well, such as the sweating that he experiences without warning. Patient reports this makes him feel "blue." He is hopeful that an improvement in his pain will translate into an improvement in his energy levels.    Mr. Harshberger relies on his faith as a source of strength in coping with his illness. He states that he does not want to claim having cancer and intentionally turns off the television when there is content on cancer or death. He is trying to enjoy each day of his life and not look at cancer as a death sentence. I discussed the importance of continued conversation with family and their medical providers regarding overall plan of care and treatment options,  ensuring decisions are within the context of the patients values and GOCs. Assessment & Plan Pain management Pain managed with Tylenol and tramadol; tramadol causes somnolence and euphoria.However effective in the setting of severe pain. Prefers Xtampza, effective with Tylenol.  - Continue Gabapentin 300mg  2 tablets twice daily. Marlowe Kays 9mg  every 12 hours  -Continue Tramadol and Tylenol as needed.   Hot flashes Severe nocturnal hot flashes likely due to hormonal treatment, causing diaphoresis and sleep disruption.  Radiation therapy Scheduled for ten-day radiation therapy starting Monday. Consider alternative IV radiation if needed.  Gastrointestinal discomfort Bloating, early satiety, and abdominal spasms. Occasionally uses Melantra. Reglan considered for spasms and fullness. - Prescribe Reglan 10mg  up to three times daily as needed.  Follow-up Follow-up with patient in 4-6 weeks in collaboration with oncology visits. Sooner if needed.    Patient expressed understanding and was in agreement with this plan. He also understands that He can call the clinic at any time with any questions, concerns, or complaints.   Any controlled substances utilized were prescribed in the context of palliative care. PDMP has been reviewed.   Visit consisted of counseling and education dealing with the complex and emotionally intense issues of symptom management and palliative care in the setting of serious and potentially life-threatening illness.  Willette Alma, AGPCNP-BC  Palliative Medicine Team/[facility]

## 2024-03-24 NOTE — Assessment & Plan Note (Addendum)
 normal b12, folate and ferritin Secondary to treatment and prostate cancer Will monitor before each Pluvicto Repeat lab in 4 weeks.

## 2024-03-24 NOTE — Assessment & Plan Note (Addendum)
 He is getting ADT and will be getting Xgeva at [facility] in March pending dental work Continue Vit D 2000 international units and calcium 1200mg  daily Control BP and CV risk management

## 2024-03-25 ENCOUNTER — Other Ambulatory Visit: Payer: Self-pay

## 2024-03-25 ENCOUNTER — Telehealth: Payer: Self-pay

## 2024-03-25 LAB — PSA, TOTAL AND FREE
PSA, Free Pct: 20.9 %
PSA, Free: 10.7 ng/mL
Prostate Specific Ag, Serum: 51.1 ng/mL — ABNORMAL HIGH (ref 0.0–4.0)

## 2024-03-25 LAB — TESTOSTERONE: Testosterone: 7 ng/dL — ABNORMAL LOW (ref 264–916)

## 2024-03-25 LAB — PROSTATE-SPECIFIC AG, SERUM (LABCORP): Prostate Specific Ag, Serum: 55.5 ng/mL — ABNORMAL HIGH (ref 0.0–4.0)

## 2024-03-25 NOTE — Telephone Encounter (Signed)
 Craig Collins rescheduled his appointment.

## 2024-03-25 NOTE — Telephone Encounter (Signed)
 Patient scheduled appointments. Patient is aware of all appointment details.

## 2024-03-26 ENCOUNTER — Ambulatory Visit: Payer: Medicaid Other

## 2024-03-28 ENCOUNTER — Ambulatory Visit
Admission: RE | Admit: 2024-03-28 | Discharge: 2024-03-28 | Disposition: A | Discharge status: Home / Self Care | Source: Ambulatory Visit | Attending: Radiation Oncology | Admitting: Radiation Oncology

## 2024-03-28 ENCOUNTER — Other Ambulatory Visit: Payer: Self-pay

## 2024-03-28 DIAGNOSIS — Z192 Hormone resistant malignancy status: Secondary | ICD-10-CM | POA: Diagnosis not present

## 2024-03-28 DIAGNOSIS — Z510 Encounter for antineoplastic radiation therapy: Secondary | ICD-10-CM | POA: Diagnosis not present

## 2024-03-28 DIAGNOSIS — C7951 Secondary malignant neoplasm of bone: Secondary | ICD-10-CM | POA: Insufficient documentation

## 2024-03-28 DIAGNOSIS — C61 Malignant neoplasm of prostate: Secondary | ICD-10-CM | POA: Insufficient documentation

## 2024-03-28 LAB — RAD ONC ARIA SESSION SUMMARY

## 2024-03-28 NOTE — Progress Notes (Signed)
 Chart reviewed. Will be getting Pluvicto on 4/24.  Please move appt from 5/5 to 4/21 with lab, follow up MD visit and potential blood transfusion afterward after radiation and before Pluvicto. Thank you

## 2024-03-29 ENCOUNTER — Other Ambulatory Visit: Payer: Self-pay

## 2024-03-29 ENCOUNTER — Ambulatory Visit
Admission: RE | Admit: 2024-03-29 | Discharge: 2024-03-29 | Disposition: A | Discharge status: Home / Self Care | Source: Ambulatory Visit | Attending: Radiation Oncology

## 2024-03-29 DIAGNOSIS — C7951 Secondary malignant neoplasm of bone: Secondary | ICD-10-CM | POA: Diagnosis not present

## 2024-03-29 DIAGNOSIS — Z192 Hormone resistant malignancy status: Secondary | ICD-10-CM | POA: Diagnosis not present

## 2024-03-29 DIAGNOSIS — Z510 Encounter for antineoplastic radiation therapy: Secondary | ICD-10-CM | POA: Diagnosis not present

## 2024-03-29 DIAGNOSIS — C61 Malignant neoplasm of prostate: Secondary | ICD-10-CM | POA: Diagnosis not present

## 2024-03-29 LAB — RAD ONC ARIA SESSION SUMMARY

## 2024-03-30 ENCOUNTER — Other Ambulatory Visit: Payer: Self-pay

## 2024-03-30 ENCOUNTER — Ambulatory Visit
Admission: RE | Admit: 2024-03-30 | Discharge: 2024-03-30 | Disposition: A | Discharge status: Home / Self Care | Source: Ambulatory Visit | Attending: Radiation Oncology | Admitting: Radiation Oncology

## 2024-03-30 DIAGNOSIS — Z510 Encounter for antineoplastic radiation therapy: Secondary | ICD-10-CM | POA: Diagnosis not present

## 2024-03-30 DIAGNOSIS — C61 Malignant neoplasm of prostate: Secondary | ICD-10-CM | POA: Diagnosis not present

## 2024-03-30 DIAGNOSIS — C7951 Secondary malignant neoplasm of bone: Secondary | ICD-10-CM | POA: Diagnosis not present

## 2024-03-30 DIAGNOSIS — Z192 Hormone resistant malignancy status: Secondary | ICD-10-CM | POA: Diagnosis not present

## 2024-03-30 LAB — RAD ONC ARIA SESSION SUMMARY

## 2024-03-31 ENCOUNTER — Other Ambulatory Visit: Payer: Self-pay

## 2024-03-31 ENCOUNTER — Encounter (HOSPITAL_COMMUNITY)

## 2024-03-31 ENCOUNTER — Ambulatory Visit
Admission: RE | Admit: 2024-03-31 | Discharge: 2024-03-31 | Disposition: A | Discharge status: Home / Self Care | Source: Ambulatory Visit | Attending: Radiation Oncology | Admitting: Radiation Oncology

## 2024-03-31 DIAGNOSIS — C7951 Secondary malignant neoplasm of bone: Secondary | ICD-10-CM | POA: Diagnosis not present

## 2024-03-31 DIAGNOSIS — C61 Malignant neoplasm of prostate: Secondary | ICD-10-CM | POA: Diagnosis not present

## 2024-03-31 LAB — RAD ONC ARIA SESSION SUMMARY

## 2024-04-01 ENCOUNTER — Other Ambulatory Visit: Payer: Self-pay

## 2024-04-01 ENCOUNTER — Ambulatory Visit
Admission: RE | Admit: 2024-04-01 | Discharge: 2024-04-01 | Disposition: A | Discharge status: Home / Self Care | Source: Ambulatory Visit | Attending: Radiation Oncology | Admitting: Radiation Oncology

## 2024-04-01 DIAGNOSIS — C61 Malignant neoplasm of prostate: Secondary | ICD-10-CM | POA: Diagnosis not present

## 2024-04-01 DIAGNOSIS — Z192 Hormone resistant malignancy status: Secondary | ICD-10-CM | POA: Diagnosis not present

## 2024-04-01 DIAGNOSIS — Z510 Encounter for antineoplastic radiation therapy: Secondary | ICD-10-CM | POA: Diagnosis not present

## 2024-04-01 DIAGNOSIS — C7951 Secondary malignant neoplasm of bone: Secondary | ICD-10-CM | POA: Diagnosis not present

## 2024-04-01 LAB — RAD ONC ARIA SESSION SUMMARY

## 2024-04-04 ENCOUNTER — Other Ambulatory Visit: Payer: Self-pay

## 2024-04-04 ENCOUNTER — Ambulatory Visit

## 2024-04-04 ENCOUNTER — Ambulatory Visit
Admission: RE | Admit: 2024-04-04 | Discharge: 2024-04-04 | Disposition: A | Discharge status: Home / Self Care | Source: Ambulatory Visit | Attending: Radiation Oncology | Admitting: Radiation Oncology

## 2024-04-04 DIAGNOSIS — C61 Malignant neoplasm of prostate: Secondary | ICD-10-CM | POA: Diagnosis not present

## 2024-04-04 DIAGNOSIS — C7951 Secondary malignant neoplasm of bone: Secondary | ICD-10-CM | POA: Diagnosis not present

## 2024-04-04 LAB — RAD ONC ARIA SESSION SUMMARY

## 2024-04-04 NOTE — Progress Notes (Signed)
 Craig Collins was in clinic to see doctor about left side scrotum/ head of penis numbness when bathing x 2 days.  Reports this numbness last only for a few minutes then it gone when walking.  He wasn't sure if radiation or chemo related.  Patient had to go couldn't wait for Dr. Lorri Rota.  After RN spoke briefly with Dr. Lorri Rota he concluded that its related to the tumor which is pressing the nerve.  Dr. Lorri Rota feels that the radiation will help resolve this issue.  RN will call patient to relay message.  Vitals: 97.4-115-20-147/99 O2 sat 100% RA.  Craig Collins was made aware of the conversation with Dr. Lorri Rota, will continue to monitor and inform staff if anything new occurs.

## 2024-04-05 ENCOUNTER — Other Ambulatory Visit: Payer: Self-pay

## 2024-04-05 ENCOUNTER — Ambulatory Visit
Admission: RE | Admit: 2024-04-05 | Discharge: 2024-04-05 | Disposition: A | Discharge status: Home / Self Care | Source: Ambulatory Visit | Attending: Radiation Oncology | Admitting: Radiation Oncology

## 2024-04-05 DIAGNOSIS — C61 Malignant neoplasm of prostate: Secondary | ICD-10-CM | POA: Diagnosis not present

## 2024-04-05 DIAGNOSIS — C7951 Secondary malignant neoplasm of bone: Secondary | ICD-10-CM | POA: Diagnosis not present

## 2024-04-05 LAB — RAD ONC ARIA SESSION SUMMARY

## 2024-04-06 ENCOUNTER — Ambulatory Visit
Admission: RE | Admit: 2024-04-06 | Discharge: 2024-04-06 | Disposition: A | Discharge status: Home / Self Care | Source: Ambulatory Visit | Attending: Radiation Oncology | Admitting: Radiation Oncology

## 2024-04-06 ENCOUNTER — Other Ambulatory Visit: Payer: Self-pay

## 2024-04-06 DIAGNOSIS — Z192 Hormone resistant malignancy status: Secondary | ICD-10-CM | POA: Diagnosis not present

## 2024-04-06 DIAGNOSIS — Z510 Encounter for antineoplastic radiation therapy: Secondary | ICD-10-CM | POA: Diagnosis not present

## 2024-04-06 DIAGNOSIS — C61 Malignant neoplasm of prostate: Secondary | ICD-10-CM | POA: Diagnosis not present

## 2024-04-06 DIAGNOSIS — C7951 Secondary malignant neoplasm of bone: Secondary | ICD-10-CM | POA: Diagnosis not present

## 2024-04-06 LAB — RAD ONC ARIA SESSION SUMMARY
Course Elapsed Days: 9
Plan Fractions Treated to Date: 8
Plan Fractions Treated to Date: 8
Plan Prescribed Dose Per Fraction: 3 Gy
Plan Prescribed Dose Per Fraction: 3 Gy
Plan Total Fractions Prescribed: 10
Plan Total Fractions Prescribed: 10
Plan Total Prescribed Dose: 30 Gy
Plan Total Prescribed Dose: 30 Gy
Reference Point Dosage Given to Date: 24 Gy
Reference Point Dosage Given to Date: 24 Gy
Reference Point Session Dosage Given: 3 Gy
Reference Point Session Dosage Given: 3 Gy
Session Number: 8

## 2024-04-07 ENCOUNTER — Other Ambulatory Visit: Payer: Self-pay

## 2024-04-07 ENCOUNTER — Ambulatory Visit
Admission: RE | Admit: 2024-04-07 | Discharge: 2024-04-07 | Disposition: A | Discharge status: Home / Self Care | Source: Ambulatory Visit | Attending: Radiation Oncology | Admitting: Radiation Oncology

## 2024-04-07 DIAGNOSIS — C61 Malignant neoplasm of prostate: Secondary | ICD-10-CM | POA: Diagnosis not present

## 2024-04-07 DIAGNOSIS — C7951 Secondary malignant neoplasm of bone: Secondary | ICD-10-CM | POA: Diagnosis not present

## 2024-04-07 LAB — RAD ONC ARIA SESSION SUMMARY
Course Elapsed Days: 10
Plan Fractions Treated to Date: 9
Plan Fractions Treated to Date: 9
Plan Prescribed Dose Per Fraction: 3 Gy
Plan Prescribed Dose Per Fraction: 3 Gy
Plan Total Fractions Prescribed: 10
Plan Total Fractions Prescribed: 10
Plan Total Prescribed Dose: 30 Gy
Plan Total Prescribed Dose: 30 Gy
Reference Point Dosage Given to Date: 27 Gy
Reference Point Dosage Given to Date: 27 Gy
Reference Point Session Dosage Given: 3 Gy
Reference Point Session Dosage Given: 3 Gy
Session Number: 9

## 2024-04-08 ENCOUNTER — Ambulatory Visit
Admission: RE | Admit: 2024-04-08 | Discharge: 2024-04-08 | Disposition: A | Discharge status: Home / Self Care | Source: Ambulatory Visit | Attending: Radiation Oncology | Admitting: Radiation Oncology

## 2024-04-08 ENCOUNTER — Other Ambulatory Visit: Payer: Self-pay

## 2024-04-08 DIAGNOSIS — Z510 Encounter for antineoplastic radiation therapy: Secondary | ICD-10-CM | POA: Diagnosis not present

## 2024-04-08 DIAGNOSIS — Z192 Hormone resistant malignancy status: Secondary | ICD-10-CM | POA: Diagnosis not present

## 2024-04-08 DIAGNOSIS — C61 Malignant neoplasm of prostate: Secondary | ICD-10-CM | POA: Diagnosis not present

## 2024-04-08 DIAGNOSIS — C7951 Secondary malignant neoplasm of bone: Secondary | ICD-10-CM | POA: Diagnosis not present

## 2024-04-08 LAB — RAD ONC ARIA SESSION SUMMARY
Course Elapsed Days: 11
Plan Fractions Treated to Date: 10
Plan Fractions Treated to Date: 10
Plan Prescribed Dose Per Fraction: 3 Gy
Plan Prescribed Dose Per Fraction: 3 Gy
Plan Total Fractions Prescribed: 10
Plan Total Fractions Prescribed: 10
Plan Total Prescribed Dose: 30 Gy
Plan Total Prescribed Dose: 30 Gy
Reference Point Dosage Given to Date: 30 Gy
Reference Point Dosage Given to Date: 30 Gy
Reference Point Session Dosage Given: 3 Gy
Reference Point Session Dosage Given: 3 Gy
Session Number: 10

## 2024-04-11 NOTE — Radiation Completion Notes (Addendum)
  Radiation Oncology         [phone]) [phone] ________________________________  Name: Craig Collins MRN: [id number]  Date: 04/08/2024  DOB: 1966/09/25  Referring Physician: Janeann Mean, M.D. Date of Service: 2024-04-11 Radiation Oncologist: Bartholome Ligas, M.D. [facility] -      RADIATION ONCOLOGY END OF TREATMENT NOTE     Diagnosis:  58 y/o man with painful bony metastases in the left and right hips and lumbar spine secondary to castrate resistant metastatic prostate cancer   Intent: Palliative     ==========DELIVERED PLANS==========  First Treatment Date: 2024-03-28 Last Treatment Date: 2024-04-08   Plan Name: [id number] Site: Hip, Left Technique: 3D Mode: Photon Dose Per Fraction: 3 Gy Prescribed Dose (Delivered / Prescribed): 30 Gy / 30 Gy Prescribed Fxs (Delivered / Prescribed): 10 / 10   Plan Name: Pelvis_R Site: Hip, Right Technique: 3D Mode: Photon Dose Per Fraction: 3 Gy Prescribed Dose (Delivered / Prescribed): 30 Gy / 30 Gy Prescribed Fxs (Delivered / Prescribed): 10 / 10     ==========ON TREATMENT VISIT DATES========== 2024-04-01, 2024-04-04    See weekly On Treatment Notes in Epic for details in the Media tab (listed as Progress notes on the On Treatment Visit Dates listed above).  The patient will receive a call in about one month from the radiation oncology department. He will continue follow up with his urologist, Dr. Derrick Fling, and medical oncologist, Dr. Alita Irwin, as well.  ------------------------------------------------   [name], MD [facility]  Radiation Oncology Direct Dial: [phone]  Fax: [phone] [email or web address]  Skype  LinkedIn

## 2024-04-12 ENCOUNTER — Telehealth: Payer: Self-pay

## 2024-04-12 NOTE — Telephone Encounter (Signed)
 Pt shows up to the office today, thinking he has an appointment w/ Dr. Alita Irwin. Informed that no appointment is scheduled w/ Dr. Alita Irwin until 5/22. Pt states he thought he was supposed to be evaluated prior to Pluvicto  on 04/14/24 in case he needs a blood transfusion. Discussed w/ Dr. Alita Irwin, who states that unless pt is sob or fatigued, he does not need to see him until 5/22. Informed pt, and he reports no fatigue or sob. Instructed him to call office if he develops any s/s of anemia such as fatigue or sob; otherwise, he will not need to see Dr. Alita Irwin until 5/22. He verbalizes understanding.

## 2024-04-14 ENCOUNTER — Encounter (HOSPITAL_COMMUNITY)
Admission: RE | Admit: 2024-04-14 | Discharge: 2024-04-14 | Disposition: A | Discharge status: Home / Self Care | Source: Ambulatory Visit

## 2024-04-14 ENCOUNTER — Other Ambulatory Visit: Payer: Self-pay

## 2024-04-14 VITALS — BP 114/77 | HR 102 | Resp 16

## 2024-04-14 DIAGNOSIS — C779 Secondary and unspecified malignant neoplasm of lymph node, unspecified: Secondary | ICD-10-CM | POA: Diagnosis not present

## 2024-04-14 DIAGNOSIS — Z192 Hormone resistant malignancy status: Secondary | ICD-10-CM | POA: Insufficient documentation

## 2024-04-14 DIAGNOSIS — C7951 Secondary malignant neoplasm of bone: Secondary | ICD-10-CM | POA: Diagnosis not present

## 2024-04-14 DIAGNOSIS — C61 Malignant neoplasm of prostate: Secondary | ICD-10-CM | POA: Diagnosis not present

## 2024-04-14 DIAGNOSIS — D6481 Anemia due to antineoplastic chemotherapy: Secondary | ICD-10-CM | POA: Diagnosis not present

## 2024-04-14 LAB — CBC WITH DIFFERENTIAL/PLATELET
Abs Immature Granulocytes: 0.02 10*3/uL (ref 0.00–0.07)
Basophils Absolute: 0 10*3/uL (ref 0.0–0.1)
Basophils Relative: 0 %
Eosinophils Absolute: 0.4 10*3/uL (ref 0.0–0.5)
Eosinophils Relative: 8 %
HCT: 28.9 % — ABNORMAL LOW (ref 39.0–52.0)
Hemoglobin: 9.3 g/dL — ABNORMAL LOW (ref 13.0–17.0)
Immature Granulocytes: 0 %
Lymphocytes Relative: 8 %
Lymphs Abs: 0.4 10*3/uL — ABNORMAL LOW (ref 0.7–4.0)
MCH: 29.8 pg (ref 26.0–34.0)
MCHC: 32.2 g/dL (ref 30.0–36.0)
MCV: 92.6 fL (ref 80.0–100.0)
Monocytes Absolute: 0.5 10*3/uL (ref 0.1–1.0)
Monocytes Relative: 11 %
Neutro Abs: 3.7 10*3/uL (ref 1.7–7.7)
Neutrophils Relative %: 73 %
Platelets: 263 10*3/uL (ref 150–400)
RBC: 3.12 MIL/uL — ABNORMAL LOW (ref 4.22–5.81)
RDW: 14.6 % (ref 11.5–15.5)
WBC: 5 10*3/uL (ref 4.0–10.5)
nRBC: 0 % (ref 0.0–0.2)

## 2024-04-14 MED ORDER — SODIUM CHLORIDE 0.9 % IV BOLUS
1000.0000 mL | Freq: Once | INTRAVENOUS | Status: AC
Start: 2024-04-14 — End: ?

## 2024-04-14 MED ORDER — SODIUM CHLORIDE 0.9 % IV BOLUS
1000.0000 mL | Freq: Once | INTRAVENOUS | Status: AC
  Administered 2024-04-14: 1000 mL via INTRAVENOUS

## 2024-04-14 MED ORDER — LUTETIUM LU 177 VIPIVOTIDE TET 1000 MBQ/ML IV SOLN
209.9958 | Freq: Once | INTRAVENOUS | Status: AC
  Administered 2024-04-14: 209.9958 via INTRAVENOUS

## 2024-04-14 NOTE — Progress Notes (Signed)
 CLINICAL DATA: [58 year old male with castrate resistant metastatic prostate carcinoma.  Taxane chemotherapy 11 cycles.  Recent radiation external beam 2 hip on the LEFT.  PSMA PET scan demonstrate multifocal skeletal metastasis and metastatic adenopathy.]  EXAM: NUCLEAR MEDICINE PLUVICTO  INJECTION  TECHNIQUE: Infusion: The nuclear medicine technologist and I personally verified the dose activity to be delivered as specified in the written directive, and verified the patient identification via 2 separate methods.  Initial flush of the intravenous catheter was performed was sterile saline. The dose syringe was connected to the catheter and the Lu-177 Pluvicto  administered over a 1 to 10 min infusion. Single 10 cc  lushes with normal saline follow the dose. No complications were noted. The entire IV tubing, venocatheter, stopcock and syringes was removed in total, placed in a disposal bag and sent for assay of the residual activity, which will be reported at a later time in our EMR by the physics staff. Pressure was applied to the venipuncture site, and a compression bandage placed. Patient monitored for 1 hour following infusion.    Radiation Safety personnel were present to perform the discharge survey, as detailed on their documentation. After a short period of observation, the patient had his IV removed.  RADIOPHARMACEUTICALS: [Two hundred ten] microcuries Lu-177 PLUVICTO   FINDINGS: Current Infusion: [1]  Planned Infusions: 6    Patient presented to nuclear medicine for treatment. The patient's most recent blood counts were reviewed and remains a adequate candidate to proceed with Lu-177 Pluvicto .    Patient reports some fatigue and muscle ache.  Improved bone pain following external beam radiation to the hip.     Patient remains mildly anemic related to prior chemotherapy with hemoglobin equal 9.3.  Renal function and liver function normal.  PSA rising.     The patient was  situated in an infusion suite with a contact barrier placed under the arm. Intravenous access was established, using sterile technique, and a normal saline infusion from a syringe was started.     Micro-dosimetry: The prescribed radiation activity was assayed and confirmed to be within specified tolerance.  IMPRESSION: Current Infusion: [1]  Planned Infusions: 6    [The patient tolerated the infusion well. The patient will return in one month for ongoing care.]

## 2024-04-14 NOTE — Progress Notes (Signed)
 Pt. Tolerated first Plovicto treatment well.

## 2024-04-14 NOTE — Written Directive (Addendum)
  PLUVICTO   THERAPY   RADIOPHARMACEUTICAL: Lutetium 177 vipivotide tetraxetan (Pluvicto )     PRESCRIBED DOSE FOR ADMINISTRATION:  200 mCi   ROUTE OFADMINISTRATION:  IV   DIAGNOSIS:  metastatic castration resistant adenocarcinoma of prostate, Metastatic castration-resistant adenocarcinoma of prostate    REFERRING PHYSICIAN: Janeann Mean   TREATMENT #: 1   ADDITIONAL PHYSICIAN COMMENTS/NOTES:   AUTHORIZED USER SIGNATURE & TIME STAMP: Reino Carbo, MD   04/14/24    12:36 PM

## 2024-04-15 ENCOUNTER — Other Ambulatory Visit: Payer: Self-pay | Admitting: Hematology

## 2024-04-15 DIAGNOSIS — C778 Secondary and unspecified malignant neoplasm of lymph nodes of multiple regions: Secondary | ICD-10-CM

## 2024-04-18 ENCOUNTER — Other Ambulatory Visit: Payer: Self-pay

## 2024-04-18 MED ORDER — GABAPENTIN 100 MG PO CAPS
100.0000 mg | ORAL_CAPSULE | Freq: Two times a day (BID) | ORAL | 0 refills | Status: DC | PRN
  Filled 2024-04-18: qty 120, 30d supply, fill #0

## 2024-04-21 ENCOUNTER — Ambulatory Visit

## 2024-04-21 ENCOUNTER — Other Ambulatory Visit

## 2024-04-25 ENCOUNTER — Other Ambulatory Visit: Payer: Self-pay

## 2024-04-25 ENCOUNTER — Other Ambulatory Visit

## 2024-04-25 ENCOUNTER — Other Ambulatory Visit (HOSPITAL_COMMUNITY): Payer: Self-pay

## 2024-04-25 ENCOUNTER — Ambulatory Visit

## 2024-04-25 ENCOUNTER — Other Ambulatory Visit: Payer: Self-pay | Admitting: Hematology

## 2024-04-25 DIAGNOSIS — C778 Secondary and unspecified malignant neoplasm of lymph nodes of multiple regions: Secondary | ICD-10-CM

## 2024-05-06 NOTE — Progress Notes (Addendum)
  Radiation Oncology         (336) [phone] ________________________________  Name: Craig Collins MRN: [id number]  Date of Service: 05/06/2024  DOB: [birth year]  Post Treatment Telephone Note  Diagnosis:  C79.51 Secondary malignant neoplasm of bone (as documented in provider EOT note)  The patient was available for call today.  The patient did notnote fatigue during radiation. The patient did not note skin changes in the field of radiation during therapy. The patient has noticed improvement in pain in the area(s) treated with radiation. The patient states "He is NOT taking dexamethasone ." The patient does not have symptoms of  weakness or loss of control of the extremities. The patient does not have symptoms of headache. The patient does not have symptoms of seizure or uncontrolled movement. The patient does not have symptoms of changes in vision. The patient does not have changes in speech. The patient does not have confusion.   The patient is scheduled for ongoing care with Dr. Alita Irwin in medical oncology. The patient was encouraged to call if he develops concerns or questions regarding radiation.   This concludes the interaction.  Avery Bodo, LPN

## 2024-05-10 ENCOUNTER — Ambulatory Visit
Admission: RE | Admit: 2024-05-10 | Discharge: 2024-05-10 | Disposition: A | Discharge status: Home / Self Care | Source: Ambulatory Visit

## 2024-05-10 DIAGNOSIS — C7951 Secondary malignant neoplasm of bone: Secondary | ICD-10-CM | POA: Insufficient documentation

## 2024-05-10 DIAGNOSIS — C61 Malignant neoplasm of prostate: Secondary | ICD-10-CM | POA: Insufficient documentation

## 2024-05-12 ENCOUNTER — Other Ambulatory Visit: Payer: Self-pay

## 2024-05-12 ENCOUNTER — Inpatient Hospital Stay

## 2024-05-12 ENCOUNTER — Inpatient Hospital Stay: Attending: Nurse Practitioner

## 2024-05-12 ENCOUNTER — Encounter: Payer: Self-pay | Admitting: Nurse Practitioner

## 2024-05-12 ENCOUNTER — Inpatient Hospital Stay (HOSPITAL_BASED_OUTPATIENT_CLINIC_OR_DEPARTMENT_OTHER): Admitting: Nurse Practitioner

## 2024-05-12 ENCOUNTER — Inpatient Hospital Stay (HOSPITAL_BASED_OUTPATIENT_CLINIC_OR_DEPARTMENT_OTHER): Attending: Nurse Practitioner

## 2024-05-12 VITALS — BP 115/73 | HR 92 | Temp 97.3°F | Resp 20 | Wt 244.4 lb

## 2024-05-12 DIAGNOSIS — Z515 Encounter for palliative care: Secondary | ICD-10-CM

## 2024-05-12 DIAGNOSIS — T451X5A Adverse effect of antineoplastic and immunosuppressive drugs, initial encounter: Secondary | ICD-10-CM

## 2024-05-12 DIAGNOSIS — K5903 Drug induced constipation: Secondary | ICD-10-CM | POA: Diagnosis not present

## 2024-05-12 DIAGNOSIS — C61 Malignant neoplasm of prostate: Secondary | ICD-10-CM | POA: Diagnosis not present

## 2024-05-12 DIAGNOSIS — R530 Neoplastic (malignant) related fatigue: Secondary | ICD-10-CM

## 2024-05-12 DIAGNOSIS — Z192 Hormone resistant malignancy status: Secondary | ICD-10-CM

## 2024-05-12 DIAGNOSIS — M792 Neuralgia and neuritis, unspecified: Secondary | ICD-10-CM

## 2024-05-12 DIAGNOSIS — G629 Polyneuropathy, unspecified: Secondary | ICD-10-CM

## 2024-05-12 DIAGNOSIS — D6481 Anemia due to antineoplastic chemotherapy: Secondary | ICD-10-CM

## 2024-05-12 DIAGNOSIS — C7951 Secondary malignant neoplasm of bone: Secondary | ICD-10-CM | POA: Diagnosis not present

## 2024-05-12 DIAGNOSIS — Z95828 Presence of other vascular implants and grafts: Secondary | ICD-10-CM

## 2024-05-12 DIAGNOSIS — Z9189 Other specified personal risk factors, not elsewhere classified: Secondary | ICD-10-CM | POA: Diagnosis not present

## 2024-05-12 DIAGNOSIS — D649 Anemia, unspecified: Secondary | ICD-10-CM

## 2024-05-12 DIAGNOSIS — C778 Secondary and unspecified malignant neoplasm of lymph nodes of multiple regions: Secondary | ICD-10-CM | POA: Diagnosis not present

## 2024-05-12 LAB — CBC WITH DIFFERENTIAL (CANCER CENTER ONLY)
Abs Immature Granulocytes: 0.01 10*3/uL (ref 0.00–0.07)
Basophils Absolute: 0 10*3/uL (ref 0.0–0.1)
Basophils Relative: 0 %
Eosinophils Absolute: 0.2 10*3/uL (ref 0.0–0.5)
Eosinophils Relative: 5 %
HCT: 27 % — ABNORMAL LOW (ref 39.0–52.0)
Hemoglobin: 8.7 g/dL — ABNORMAL LOW (ref 13.0–17.0)
Immature Granulocytes: 0 %
Lymphocytes Relative: 15 %
Lymphs Abs: 0.5 10*3/uL — ABNORMAL LOW (ref 0.7–4.0)
MCH: 29.2 pg (ref 26.0–34.0)
MCHC: 32.2 g/dL (ref 30.0–36.0)
MCV: 90.6 fL (ref 80.0–100.0)
Monocytes Absolute: 0.5 10*3/uL (ref 0.1–1.0)
Monocytes Relative: 15 %
Neutro Abs: 2.1 10*3/uL (ref 1.7–7.7)
Neutrophils Relative %: 65 %
Platelet Count: 211 10*3/uL (ref 150–400)
RBC: 2.98 MIL/uL — ABNORMAL LOW (ref 4.22–5.81)
RDW: 14.7 % (ref 11.5–15.5)
WBC Count: 3.2 10*3/uL — ABNORMAL LOW (ref 4.0–10.5)
nRBC: 0 % (ref 0.0–0.2)

## 2024-05-12 LAB — CMP (CANCER CENTER ONLY)
ALT: 9 U/L (ref 0–44)
AST: 11 U/L — ABNORMAL LOW (ref 15–41)
Albumin: 3.7 g/dL (ref 3.5–5.0)
Alkaline Phosphatase: 99 U/L (ref 38–126)
Anion gap: 8 (ref 5–15)
BUN: 15 mg/dL (ref 6–20)
CO2: 26 mmol/L (ref 22–32)
Calcium: 9.1 mg/dL (ref 8.9–10.3)
Chloride: 107 mmol/L (ref 98–111)
Creatinine: 1.19 mg/dL (ref 0.61–1.24)
GFR, Estimated: >60 mL/min (ref >60)
Glucose, Bld: 100 mg/dL — ABNORMAL HIGH (ref 70–99)
Potassium: 3.8 mmol/L (ref 3.5–5.1)
Sodium: 141 mmol/L (ref 135–145)
Total Bilirubin: 0.3 mg/dL (ref 0.0–1.2)
Total Protein: 6.7 g/dL (ref 6.5–8.1)

## 2024-05-12 LAB — SAMPLE TO BLOOD BANK

## 2024-05-12 MED ORDER — HEPARIN SOD (PORK) LOCK FLUSH 100 UNIT/ML IV SOLN
500.0000 [IU] | Freq: Once | INTRAVENOUS | Status: DC
Start: 2024-05-12 — End: 2024-05-12

## 2024-05-12 MED ORDER — SODIUM CHLORIDE 0.9% FLUSH
10.0000 mL | Freq: Once | INTRAVENOUS | Status: AC
  Administered 2024-05-12: 10 mL

## 2024-05-12 MED ORDER — PREGABALIN 50 MG PO CAPS
50.0000 mg | ORAL_CAPSULE | Freq: Every evening | ORAL | 0 refills | Status: DC
  Filled 2024-05-12: qty 30, 30d supply, fill #0
  Filled 2024-06-23: qty 30, 30d supply, fill #1

## 2024-05-12 NOTE — Assessment & Plan Note (Addendum)
 Repeat lab  in one week Repeat lab on 7/10 with transfusion appointment Transfuse 1 Unit of pRBC if HGB < 7

## 2024-05-12 NOTE — Progress Notes (Signed)
  Cancer Center OFFICE PROGRESS NOTE  Patient Care Team: Jerrlyn Morel, NP as PCP - General (Pulmonary Disease) Katheleen Palmer, RN as Oncology Nurse Navigator Pickenpack-Cousar, Giles Labrum, NP as Nurse Practitioner (Hospice and Palliative Medicine) Sonja Bassett, MD as Consulting Physician (Hematology and Oncology)  58 y.o. m. With history of HTN, OSA prostate cancer here for follow up.  Currently undergoing treatment for mCRPC.   Current diagnosis: mCRPC with bone metastases Initial diagnosis: GS 4+5 GG5 PSA 42 with bone metastases Germline testing: [employer] genetics BRIP1 p. [id number] Variant, Likely Benign Somatic testing: negative for HRRm, neg for dMMR. TMB 1 Muts/Mb Previous treatment:  ADT (at [facility] every 6 months with Dr. Judeth Notch), ARPI (was given apalutamide  but never started, enza 06/2022-08/2023) PSA nadir 12/16/22 at 0.3 08/05/23 - 03/05/24: docetaxel . Completed 11 cycles with progression. Current treatment: 04/14/24 C1 Pluvicto   Next treatment on 6/5.  On gabapentin  200 mg twice daily and neuropathy uncontrolled. Some drowsiness with it. Will change therapy. Assessment & Plan Metastatic castration-resistant adenocarcinoma of prostate (HCC) 04/14/24 C1 Pluvicto  Labs in 1 week before each dose Continue ADT at [facility]. Anemia associated with chemotherapy Repeat lab  in one week Repeat lab on 7/10 with transfusion appointment Transfuse 1 Unit of pRBC if HGB < 7  At risk for side effect of medication He is getting ADT and will be getting Xgeva at [facility] in March pending dental work Continue Vit D 2000 international units and calcium 1200mg  daily Control BP and CV risk management Neuropathy Will stop AM  gabapentin  for 3 days, then stop PM dose and start Lyrica 50 mg nightly.  Orders Placed This Encounter  Procedures   CBC with Differential (Cancer Center Only)    Standing Status:   Future    Expected Date:   05/19/2024    Expiration Date:   05/12/2025   Sample  to Blood Bank    Standing Status:   Future    Expiration Date:   05/12/2025     Lowanda Ruddy, MD  INTERVAL HISTORY: Patient returns for follow-up with his wife. Pain improved after first pluvicto . No worsening back pain. Feeling some pulling on the left. No profound fatigue. Pain is 90-95% better. He only need Tylenol  once a day.   Some dry mouth. Drinking a lot of fluid and no trouble urinating. No diarrhea. No bleeding.  Neuropathy on gabapentin  uncontrolled. Causing drowsiness.  Oncology History Overview Note   Cancer Staging  Prostate cancer [facility]) Staging form: Prostate, AJCC 8th Edition - Clinical: Stage IVB (cTX, cNX, pM1b, PSA: 42, Grade Group: 5) - Signed by Renna Cary, MD on 07/08/2022 Prostate specific antigen (PSA) range: 20 or greater Histologic grading system: 5 grade system     Prostate cancer (HCC) (Resolved)  12/21/2019 Initial Diagnosis   Prostate cancer (HCC)   07/08/2022 Cancer Staging   Staging form: Prostate, AJCC 8th Edition - Clinical: Stage IVB (cTX, cNX, pM1b, PSA: 42, Grade Group: 5) - Signed by Renna Cary, MD on 07/08/2022 Prostate specific antigen (PSA) range: 20 or greater Histologic grading system: 5 grade system   Metastasis to bone (HCC) (Resolved)  07/08/2022 Initial Diagnosis   Metastasis to bone (HCC)   07/16/2023 PET scan    IMPRESSION: 1. Mixed response to therapy. 2. Interval decrease in size of the prostate gland with mild residual tracer uptake within the right posterior gland. 3. Interval decrease in size of thoracic and abdominal adenopathy. 4. Persistent, multifocal tracer avid bone metastases. Overall,  the tracer avid lesions appear increased in size and degree of tracer uptake. Lesion within the right calvarium has resolved in the interval.    Miscellaneous   FoundationONE      Metastatic castration-resistant adenocarcinoma of prostate (HCC)  07/08/2022 Initial Diagnosis   Malignant neoplasm of prostate  metastatic to lymph nodes of multiple sites [facility])   08/05/2023 - 03/05/2024 Chemotherapy   Patient is on Treatment Plan : PROSTATE Docetaxel  (75) + Prednisone  q21d     12/28/2023 PET scan   1. Partial response to therapy.  No disease progression. 2. Decreased size and radiotracer activity of RIGHT iliac and retroperitoneal adenopathy. 3. No change in number or pattern of metastatic skeletal disease; however, significant interval decrease in radiotracer activity and increased sclerosis. Lesions remain intensely radiotracer avid but again decreased in avidity 4. No evidence of visceral metastasis or pulmonary metastasis.   01/20/2024 Cancer Staging   Staging form: Prostate, AJCC 8th Edition - Clinical: Stage IVB (cTX, cN1, pM1b) - Signed by Lowanda Ruddy, MD on 01/20/2024      PHYSICAL EXAMINATION: ECOG PERFORMANCE STATUS: 1 - Symptomatic but completely ambulatory  Vitals:   05/12/24 0930  BP: 115/73  Pulse: 92  Resp: 20  Temp: (!) 97.3 F (36.3 C)  SpO2: 97%   Filed Weights   05/12/24 0930  Weight: 244 lb 6.4 oz (110.9 kg)    GENERAL: alert, no distress and comfortable SKIN: skin color normal  LUNGS: clear to auscultation and percussion with normal breathing effort HEART: regular rate & rhythm  ABDOMEN: abdomen soft, non-tender and nondistended.   Relevant data reviewed during this visit included labs.

## 2024-05-12 NOTE — Assessment & Plan Note (Addendum)
 He is getting ADT and will be getting Xgeva at [facility] in March pending dental work Continue Vit D 2000 international units and calcium 1200mg  daily Control BP and CV risk management

## 2024-05-12 NOTE — Assessment & Plan Note (Addendum)
 04/14/24 C1 Pluvicto  Labs in 1 week before each dose Continue ADT at [facility].

## 2024-05-12 NOTE — Progress Notes (Signed)
 Palliative Medicine [facility]  Telephone:(336) [phone] Fax:(336) [phone]   Name: Craig Collins Date: 05/12/2024 MRN: [id number]  DOB: December 13, 1966  Patient Care Team: Jerrlyn Morel, NP as PCP - General (Pulmonary Disease) Katheleen Palmer, RN as Oncology Nurse Navigator Pickenpack-Cousar, Giles Labrum, NP as Nurse Practitioner (Hospice and Palliative Medicine) Sonja Masthope, MD as Consulting Physician (Hematology and Oncology)    INTERVAL HISTORY: Craig Collins is a 59 y.o. 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 Palliative ask to see for symptom and pain management and goals of care.   SOCIAL HISTORY:     reports that he has never smoked. He has never been exposed to tobacco smoke. He has never used smokeless tobacco. He reports that he does not currently use alcohol. He reports that he does not use drugs.  ADVANCE DIRECTIVES:  None on file   CODE STATUS: Full code  PAST MEDICAL HISTORY: Past Medical History:  Diagnosis Date   Benign prostate hyperplasia    Elevated PSA    Erectile dysfunction    Hypertension    Obesity (BMI 35.0-39.9 without comorbidity)    Prostate cancer (HCC) 05/2019   Sleep apnea    Urinary retention     ALLERGIES:  has no known allergies.  MEDICATIONS:  Current Outpatient Medications  Medication Sig Dispense Refill   albuterol  (VENTOLIN  HFA) 108 (90 Base) MCG/ACT inhaler Inhale 2 puffs into the lungs every 6 (six) hours as needed for wheezing or shortness of breath. 18 g 6   ascorbic acid (VITAMIN C) 500 MG tablet Take 500 mg by mouth daily.     aspirin EC 81 MG tablet Take 81 mg by mouth daily.     Calcium Carbonate-Vitamin D (CALCIUM 500/D) 500-125 MG-UNIT TABS Take 1 tablet by mouth daily.     cetirizine  (ZYRTEC ) 10 MG tablet Take 1 tablet (10 mg total) by mouth  daily. 30 tablet 11   cyanocobalamin  1000 MCG tablet Take 2 tablets (2,000 mcg total) by mouth daily. 30 tablet 0   cyclobenzaprine  (FLEXERIL ) 10 MG tablet Take 1 tablet (10 mg total) by mouth 3 (three) times daily as needed for muscle spasms. 30 tablet 0   fluticasone  (FLONASE ) 50 MCG/ACT nasal spray Place 2 sprays into both nostrils daily. 48 g 0   gabapentin  (NEURONTIN ) 100 MG capsule Take 1-2 capsules (100-200 mg total) by mouth 2 (two) times daily as needed for neuropathy 120 capsule 0   hydrochlorothiazide  (HYDRODIURIL ) 25 MG tablet TAKE 1/2 TABLET (12.5 MG TOTAL) BY MOUTH DAILY. 45 tablet 3   lisinopril  (ZESTRIL ) 20 MG tablet Take 1 tablet (20 mg total) by mouth daily. 90 tablet 3   metoCLOPramide  (REGLAN ) 10 MG tablet Take 1 tablet (10 mg total) by mouth every 6 (six) hours as needed for nausea. 90 tablet 3   nystatin  cream (MYCOSTATIN ) Apply 1 Application topically 2 (two) times daily. 30 g 1   oxyCODONE  ER (XTAMPZA  ER) 9 MG C12A Take 9 mg by mouth every 12 (twelve) hours. 60 capsule 0   sodium chloride  (OCEAN) 0.65 % SOLN nasal spray Place 1 spray into both nostrils as needed for congestion. 30 mL 0   tadalafil  (CIALIS ) 20 MG tablet Take 1 tablet (20 mg total) by mouth daily as needed. 10 tablet 11   tamsulosin  (FLOMAX ) 0.4 MG CAPS capsule Take 1 capsule (0.4 mg total) by mouth  daily. 30 capsule 3   traMADol  (ULTRAM ) 50 MG tablet Take 1 - 2 tablets (50 - 100 mg total) by mouth every 6 hours as needed for moderate pain or severe pain. 90 tablet 0   VIAGRA  25 MG tablet Take 1 tablet (25 mg total) by mouth daily as needed for erectile dysfunction. 10 tablet 0   No current facility-administered medications for this visit.    VITAL SIGNS: There were no vitals taken for this visit. There were no vitals filed for this visit.  Estimated body mass index is 35.07 kg/m as calculated from the following:   Height as of 03/18/24: 5\' 10"  (1.778 m).   Weight as of an earlier encounter on 05/12/24:  244 lb 6.4 oz (110.9 kg).   PERFORMANCE STATUS (ECOG) : 1 - Symptomatic but completely ambulatory  Physical Exam General: NAD Cardiovascular: regular rate and rhythm Pulmonary: normal breathing pattern Extremities: no edema, no joint deformities Skin: no rashes Neurological: AAO x3  IMPRESSION: Discussed the use of AI scribe software for clinical note transcription with the patient, who gave verbal consent to proceed.  History of Present Illness Craig Collins is a 58 year old male who presents for a follow-up visit. He is doing exceptionally well overall. No acute distress. Denies concerns with nausea, vomiting, or diarrhea. He experiences mild constipation, for which he takes Miralax, effectively alleviating the symptoms.  His appetite remains stable, and he is not experiencing significant pain. His energy levels are gradually improving, and he is sleeping well at night.  He experiences variable pain levels, which are less severe than before. He manages the pain with Tylenol . Patient seen by Dr. Alita Irwin today with plants to transition from gabapentin  to Lyrica, taken once daily at night.  No new symptom needs. Will continue to support as needed.   Goals of Care 03/24/24: Craig Collins is realistic in his understanding of current cancer state and expectations. He continues to remain hopeful for stability. Goals remain clear to continue to treat the treatable allowing him every opportunity to continue thriving. Relies on his [name] faith.   04/16/23-We discussed Craig Collins current illness and what it means in the larger context of his on-going co-morbidities. Natural disease trajectory and expectations were discussed. We discussed the importance of continued conversation with family and their medical providers regarding overall plan of care and treatment options, ensuring decisions are within the context of the patients values and GOCs.   Craig Collins relates that it has become more  difficult for him to leave home, mostly due to the pain and fatigue. There are other factors as well, such as the sweating that he experiences without warning. Patient reports this makes him feel "blue." He is hopeful that an improvement in his pain will translate into an improvement in his energy levels.    Craig Collins relies on his faith as a source of strength in coping with his illness. He states that he does not want to claim having cancer and intentionally turns off the television when there is content on cancer or death. He is trying to enjoy each day of his life and not look at cancer as a death sentence. I discussed the importance of continued conversation with family and their medical providers regarding overall plan of care and treatment options, ensuring decisions are within the context of the patients values and GOCs. Assessment & Plan Pain management Pain managed with Tylenol  and tramadol  as needed. Does not require frequent use. Intermittent pain, less  severe. Transitioning to Lyrica for evaluation. - Continue Tylenol  as needed. - Initiate Lyrica once daily at night.  Constipation Mild constipation managed with Miralax. - Continue Miralax as needed.  Follow-up Follow-up with patient in 4-6 weeks in collaboration with oncology visits. Sooner if needed.   Patient expressed understanding and was in agreement with this plan. He also understands that He can call the clinic at any time with any questions, concerns, or complaints.   Any controlled substances utilized were prescribed in the context of palliative care. PDMP has been reviewed.   Visit consisted of counseling and education dealing with the complex and emotionally intense issues of symptom management and palliative care in the setting of serious and potentially life-threatening illness.  Dellia Ferguson, AGPCNP-BC  Palliative Medicine Team/[facility]

## 2024-05-12 NOTE — Assessment & Plan Note (Addendum)
 Will stop AM  gabapentin  for 3 days, then stop PM dose and start Lyrica 50 mg nightly.

## 2024-05-13 LAB — PSA, TOTAL AND FREE
PSA, Free Pct: 22.9 %
PSA, Free: 6.59 ng/mL
Prostate Specific Ag, Serum: 28.8 ng/mL — ABNORMAL HIGH (ref 0.0–4.0)

## 2024-05-18 ENCOUNTER — Other Ambulatory Visit: Payer: Self-pay

## 2024-05-18 DIAGNOSIS — C61 Malignant neoplasm of prostate: Secondary | ICD-10-CM

## 2024-05-20 ENCOUNTER — Inpatient Hospital Stay

## 2024-05-20 ENCOUNTER — Other Ambulatory Visit: Payer: Self-pay | Admitting: Nurse Practitioner

## 2024-05-20 ENCOUNTER — Other Ambulatory Visit: Payer: Self-pay

## 2024-05-20 DIAGNOSIS — J019 Acute sinusitis, unspecified: Secondary | ICD-10-CM

## 2024-05-20 DIAGNOSIS — Z95828 Presence of other vascular implants and grafts: Secondary | ICD-10-CM

## 2024-05-20 DIAGNOSIS — D6481 Anemia due to antineoplastic chemotherapy: Secondary | ICD-10-CM | POA: Diagnosis not present

## 2024-05-20 DIAGNOSIS — T451X5A Adverse effect of antineoplastic and immunosuppressive drugs, initial encounter: Secondary | ICD-10-CM | POA: Diagnosis not present

## 2024-05-20 DIAGNOSIS — C61 Malignant neoplasm of prostate: Secondary | ICD-10-CM

## 2024-05-20 DIAGNOSIS — C7951 Secondary malignant neoplasm of bone: Secondary | ICD-10-CM

## 2024-05-20 DIAGNOSIS — C778 Secondary and unspecified malignant neoplasm of lymph nodes of multiple regions: Secondary | ICD-10-CM | POA: Diagnosis not present

## 2024-05-20 LAB — CMP (CANCER CENTER ONLY)
ALT: 7 U/L (ref 0–44)
AST: 10 U/L — ABNORMAL LOW (ref 15–41)
Albumin: 3.9 g/dL (ref 3.5–5.0)
Alkaline Phosphatase: 103 U/L (ref 38–126)
Anion gap: 5 (ref 5–15)
BUN: 17 mg/dL (ref 6–20)
CO2: 27 mmol/L (ref 22–32)
Calcium: 9.2 mg/dL (ref 8.9–10.3)
Chloride: 108 mmol/L (ref 98–111)
Creatinine: 1.1 mg/dL (ref 0.61–1.24)
GFR, Estimated: >60 mL/min (ref >60)
Glucose, Bld: 96 mg/dL (ref 70–99)
Potassium: 3.6 mmol/L (ref 3.5–5.1)
Sodium: 140 mmol/L (ref 135–145)
Total Bilirubin: 0.4 mg/dL (ref 0.0–1.2)
Total Protein: 6.9 g/dL (ref 6.5–8.1)

## 2024-05-20 LAB — CBC WITH DIFFERENTIAL (CANCER CENTER ONLY)
Abs Immature Granulocytes: 0 10*3/uL (ref 0.00–0.07)
Basophils Absolute: 0 10*3/uL (ref 0.0–0.1)
Basophils Relative: 0 %
Eosinophils Absolute: 0.1 10*3/uL (ref 0.0–0.5)
Eosinophils Relative: 4 %
HCT: 27.8 % — ABNORMAL LOW (ref 39.0–52.0)
Hemoglobin: 9 g/dL — ABNORMAL LOW (ref 13.0–17.0)
Immature Granulocytes: 0 %
Lymphocytes Relative: 15 %
Lymphs Abs: 0.4 10*3/uL — ABNORMAL LOW (ref 0.7–4.0)
MCH: 29.2 pg (ref 26.0–34.0)
MCHC: 32.4 g/dL (ref 30.0–36.0)
MCV: 90.3 fL (ref 80.0–100.0)
Monocytes Absolute: 0.4 10*3/uL (ref 0.1–1.0)
Monocytes Relative: 15 %
Neutro Abs: 2 10*3/uL (ref 1.7–7.7)
Neutrophils Relative %: 66 %
Platelet Count: 226 10*3/uL (ref 150–400)
RBC: 3.08 MIL/uL — ABNORMAL LOW (ref 4.22–5.81)
RDW: 14.6 % (ref 11.5–15.5)
WBC Count: 3 10*3/uL — ABNORMAL LOW (ref 4.0–10.5)
nRBC: 0 % (ref 0.0–0.2)

## 2024-05-20 LAB — SAMPLE TO BLOOD BANK

## 2024-05-20 MED ORDER — FLUTICASONE PROPIONATE 50 MCG/ACT NA SUSP
2.0000 | Freq: Every day | NASAL | 0 refills | Status: DC
  Filled 2024-05-20: qty 16, 30d supply, fill #0
  Filled 2024-08-18: qty 16, 30d supply, fill #1

## 2024-05-20 MED ORDER — SODIUM CHLORIDE 0.9% FLUSH
10.0000 mL | Freq: Once | INTRAVENOUS | Status: AC
  Administered 2024-05-20: 10 mL

## 2024-05-20 MED ORDER — HEPARIN SOD (PORK) LOCK FLUSH 100 UNIT/ML IV SOLN
500.0000 [IU] | Freq: Once | INTRAVENOUS | Status: AC
  Administered 2024-05-20: 500 [IU]

## 2024-05-21 LAB — PSA, TOTAL AND FREE
PSA, Free Pct: 21.5 %
PSA, Free: 5.27 ng/mL
Prostate Specific Ag, Serum: 24.5 ng/mL — ABNORMAL HIGH (ref 0.0–4.0)

## 2024-05-23 ENCOUNTER — Other Ambulatory Visit: Payer: Self-pay

## 2024-05-26 ENCOUNTER — Ambulatory Visit (HOSPITAL_COMMUNITY)
Admission: RE | Admit: 2024-05-26 | Discharge: 2024-05-26 | Disposition: A | Discharge status: Home / Self Care | Source: Ambulatory Visit

## 2024-05-26 ENCOUNTER — Encounter (HOSPITAL_COMMUNITY)

## 2024-05-26 DIAGNOSIS — Z192 Hormone resistant malignancy status: Secondary | ICD-10-CM | POA: Diagnosis not present

## 2024-05-26 DIAGNOSIS — C7951 Secondary malignant neoplasm of bone: Secondary | ICD-10-CM | POA: Diagnosis not present

## 2024-05-26 DIAGNOSIS — C779 Secondary and unspecified malignant neoplasm of lymph node, unspecified: Secondary | ICD-10-CM | POA: Diagnosis not present

## 2024-05-26 DIAGNOSIS — C61 Malignant neoplasm of prostate: Secondary | ICD-10-CM | POA: Diagnosis not present

## 2024-05-26 MED ORDER — LUTETIUM LU 177 VIPIVOTIDE TET 1000 MBQ/ML IV SOLN
200.0000 | Freq: Once | INTRAVENOUS | Status: AC
  Administered 2024-05-26: 204.719 via INTRAVENOUS

## 2024-05-26 MED ORDER — SODIUM CHLORIDE 0.9 % IV BOLUS: 1000.0000 mL | Freq: Once | INTRAVENOUS | Status: DC

## 2024-05-26 NOTE — Written Directive (Addendum)
  PLUVICTO   THERAPY   RADIOPHARMACEUTICAL: Lutetium 177 vipivotide tetraxetan (Pluvicto )     PRESCRIBED DOSE FOR ADMINISTRATION:  200 mCi   ROUTE OFADMINISTRATION:  IV   DIAGNOSIS:  metastatic castration resistant adenocarcinoma of prostate Dx: Metastatic castration-resistant adenocarcinoma of prostate (   REFERRING PHYSICIAN: Dr. Alita Irwin   TREATMENT #: 2   ADDITIONAL PHYSICIAN COMMENTS/NOTES:  No labs today AUTHORIZED USER SIGNATURE & TIME STAMP: Reino Carbo, MD   05/26/24    12:27 PM

## 2024-05-26 NOTE — Progress Notes (Signed)
 CLINICAL DATA: [ : 58 year old male with castrate resistant metastatic prostate carcinoma. Taxane chemotherapy 11 cycles. Recent radiation external beam 2 hip on the LEFT. PSMA PET scan demonstrate multifocal skeletal metastasis and metastatic adenopathy.    Second Pluvicto   therapy.  ]  EXAM: NUCLEAR MEDICINE PLUVICTO  INJECTION  TECHNIQUE: Infusion: The nuclear medicine technologist and I personally verified the dose activity to be delivered as specified in the written directive, and verified the patient identification via 2 separate methods.  Initial flush of the intravenous catheter was performed was sterile saline. The dose syringe was connected to the catheter and the Lu-177 Pluvicto  administered over a 1 to 10 min infusion. Single 10 cc  lushes with normal saline follow the dose. No complications were noted. The entire IV tubing, venocatheter, stopcock and syringes was removed in total, placed in a disposal bag and sent for assay of the residual activity, which will be reported at a later time in our EMR by the physics staff. Pressure was applied to the venipuncture site, and a compression bandage placed. Patient monitored for 1 hour following infusion.    Radiation Safety personnel were present to perform the discharge survey, as detailed on their documentation. After a short period of observation, the patient had his IV removed.  RADIOPHARMACEUTICALS: [205] microcuries Lu-177 PLUVICTO   FINDINGS: Current Infusion: [2]  Planned Infusions: 6    Patient presented to nuclear medicine for treatment.    Patient reports relief of bone pain.  Stable CBC wiht  mild anemia.  Normal renal function.     PSA decreased to 24.5 after initial increase to 55.5.  Baseline 40.5.     The patient's most recent blood counts were reviewed and remains a good candidate to proceed with Lu-177 Pluvicto . The patient was situated in an infusion suite with a contact barrier placed under the arm.  Intravenous access was established, using sterile technique, and a normal saline infusion from a syringe was started.     Micro-dosimetry: The prescribed radiation activity was assayed and confirmed to be within specified tolerance.  IMPRESSION: Current Infusion: [2]  Planned Infusions: 6    [The patient tolerated the infusion well. The patient will return in 6 weeks for ongoing care.]

## 2024-06-03 ENCOUNTER — Other Ambulatory Visit (HOSPITAL_COMMUNITY): Payer: Self-pay

## 2024-06-07 ENCOUNTER — Encounter: Payer: Self-pay | Admitting: Hematology

## 2024-06-07 ENCOUNTER — Other Ambulatory Visit (HOSPITAL_COMMUNITY): Payer: Self-pay

## 2024-06-07 MED ORDER — TADALAFIL 20 MG PO TABS
20.0000 mg | ORAL_TABLET | Freq: Every day | ORAL | 1 refills | Status: AC | PRN
  Filled 2024-06-07: qty 10, 10d supply, fill #0

## 2024-06-15 ENCOUNTER — Inpatient Hospital Stay: Attending: Nurse Practitioner | Admitting: Nurse Practitioner

## 2024-06-15 ENCOUNTER — Encounter: Payer: Self-pay | Admitting: Nurse Practitioner

## 2024-06-15 DIAGNOSIS — K5903 Drug induced constipation: Secondary | ICD-10-CM | POA: Diagnosis not present

## 2024-06-15 DIAGNOSIS — C61 Malignant neoplasm of prostate: Secondary | ICD-10-CM | POA: Diagnosis not present

## 2024-06-15 DIAGNOSIS — G893 Neoplasm related pain (acute) (chronic): Secondary | ICD-10-CM

## 2024-06-15 DIAGNOSIS — Z515 Encounter for palliative care: Secondary | ICD-10-CM

## 2024-06-15 DIAGNOSIS — M792 Neuralgia and neuritis, unspecified: Secondary | ICD-10-CM | POA: Diagnosis not present

## 2024-06-15 DIAGNOSIS — Z192 Hormone resistant malignancy status: Secondary | ICD-10-CM

## 2024-06-15 NOTE — Progress Notes (Signed)
 Palliative Medicine [facility]  Telephone:([phone]) [phone] Fax:(336) [phone]   Name: AUDIEL SCHEIBER Date: 06/15/2024 MRN: [id number]  DOB: September 29, 1966  Patient Care Team: Oley Bascom RAMAN, NP as PCP - General (Pulmonary Disease) Vertell Pont, RN as Oncology Nurse Navigator Pickenpack-Cousar, Fannie SAILOR, NP as Nurse Practitioner [facility] and Palliative Medicine) Lanny Callander, MD as Consulting Physician (Hematology and Oncology)   I connected with [name] on 06/15/24 at  4:15 PM EDT by telephone and verified that I am speaking with the correct person using two identifiers.   I discussed the limitations, risks, security and privacy concerns of performing an evaluation and management service by telemedicine and the availability of in-person appointments. I also discussed with the patient that there may be a patient responsible charge related to this service. The patient expressed understanding and agreed to proceed.   Other persons participating in the visit and their role in the encounter: N/A   Patient's location: Home   Provider's location: [facility]   INTERVAL HISTORY: KADDEN OSTERHOUT is a 58 y.o. 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 Palliative ask to see for symptom and pain management and goals of care.   SOCIAL HISTORY:     reports that he has never smoked. He has never been exposed to tobacco smoke. He has never used smokeless tobacco. He reports that he does not currently use alcohol. He reports that he does not use drugs.  ADVANCE DIRECTIVES:  None on file   CODE STATUS: Full code  PAST MEDICAL HISTORY: Past Medical History:  Diagnosis Date   Benign prostate hyperplasia    Elevated PSA    Erectile dysfunction    Hypertension    Obesity (BMI 35.0-39.9 without comorbidity)    Prostate cancer  (HCC) 05/2019   Sleep apnea    Urinary retention     ALLERGIES:  has no known allergies.  MEDICATIONS:  Current Outpatient Medications  Medication Sig Dispense Refill   albuterol  (VENTOLIN  HFA) 108 (90 Base) MCG/ACT inhaler Inhale 2 puffs into the lungs every 6 (six) hours as needed for wheezing or shortness of breath. 18 g 6   ascorbic acid (VITAMIN C) 500 MG tablet Take 500 mg by mouth daily.     aspirin EC 81 MG tablet Take 81 mg by mouth daily.     Calcium Carbonate-Vitamin D (CALCIUM 500/D) 500-125 MG-UNIT TABS Take 1 tablet by mouth daily.     cetirizine  (ZYRTEC ) 10 MG tablet Take 1 tablet (10 mg total) by mouth daily. 30 tablet 11   cyanocobalamin  1000 MCG tablet Take 2 tablets (2,000 mcg total) by mouth daily. 30 tablet 0   cyclobenzaprine  (FLEXERIL ) 10 MG tablet Take 1 tablet (10 mg total) by mouth 3 (three) times daily as needed for muscle spasms. 30 tablet 0   fluticasone  (FLONASE ) 50 MCG/ACT nasal spray Place 2 sprays into both nostrils daily. 48 g 0   gabapentin  (NEURONTIN ) 100 MG capsule Take 1-2 capsules (100-200 mg total) by mouth 2 (two) times daily as needed for neuropathy 120 capsule 0   hydrochlorothiazide  (HYDRODIURIL ) 25 MG tablet TAKE 1/2 TABLET (12.5 MG TOTAL) BY MOUTH DAILY. 45 tablet 3   lisinopril  (ZESTRIL ) 20 MG tablet Take 1 tablet (20 mg total) by mouth daily. 90 tablet 3   metoCLOPramide  (REGLAN ) 10 MG tablet Take 1 tablet (10 mg total) by mouth every 6 (  six) hours as needed for nausea. 90 tablet 3   nystatin  cream (MYCOSTATIN ) Apply 1 Application topically 2 (two) times daily. 30 g 1   oxyCODONE  ER (XTAMPZA  ER) 9 MG C12A Take 9 mg by mouth every 12 (twelve) hours. 60 capsule 0   pregabalin  (LYRICA ) 50 MG capsule Take 1 capsule (50 mg total) by mouth at bedtime. 60 capsule 0   sodium chloride  (OCEAN) 0.65 % SOLN nasal spray Place 1 spray into both nostrils as needed for congestion. 30 mL 0   tadalafil  (CIALIS ) 20 MG tablet Take 1 tablet (20 mg total) by mouth  daily as needed. 10 tablet 1   tamsulosin  (FLOMAX ) 0.4 MG CAPS capsule Take 1 capsule (0.4 mg total) by mouth daily. 30 capsule 3   traMADol  (ULTRAM ) 50 MG tablet Take 1 - 2 tablets (50 - 100 mg total) by mouth every 6 hours as needed for moderate pain or severe pain. 90 tablet 0   VIAGRA  25 MG tablet Take 1 tablet (25 mg total) by mouth daily as needed for erectile dysfunction. 10 tablet 0   No current facility-administered medications for this visit.    VITAL SIGNS: There were no vitals taken for this visit. There were no vitals filed for this visit.  Estimated body mass index is 35.07 kg/m as calculated from the following:   Height as of 03/18/24: 5' 10 (1.778 m).   Weight as of 05/12/24: 244 lb 6.4 oz (110.9 kg).   PERFORMANCE STATUS (ECOG) : 1 - Symptomatic but completely ambulatory  IMPRESSION: Discussed the use of AI scribe software for clinical note transcription with the patient, who gave verbal consent to proceed.  History of Present Illness I connected by phone with Mr. LARWENCE TU for symptom management follow-up. No acute distress noted. Denies concerns with nausea, vomiting, or diarrhea. He experiences mild constipation, for which he takes Miralax, effectively alleviating the symptoms. Occasional fatigue. States he is doing well overall.   He experiences variable pain levels, which are less severe than before. He manages the pain with Tylenol . States main concern is his ongoing neuropathic discomfort. He is currently taking pregabalin  50mg  at bedtime with minimal relief. Education provided and patient recommended to increase to 50mg  twice daily. If he felt more manageable then proceed with 100mg  at bedtime only. He verbalized understanding and appreciation. Tramadol  available as needed.   No new symptom needs. All questions answered and support provided. Will continue to support as needed.   Goals of Care 03/24/24: Mr. Satter is realistic in his understanding of current  cancer state and expectations. He continues to remain hopeful for stability. Goals remain clear to continue to treat the treatable allowing him every opportunity to continue thriving. Relies on his [place] faith.   04/16/23-We discussed Mr. Putman current illness and what it means in the larger context of his on-going co-morbidities. Natural disease trajectory and expectations were discussed. We discussed the importance of continued conversation with family and their medical providers regarding overall plan of care and treatment options, ensuring decisions are within the context of the patients values and GOCs.   Mr. Szymborski relates that it has become more difficult for him to leave home, mostly due to the pain and fatigue. There are other factors as well, such as the sweating that he experiences without warning. Patient reports this makes him feel blue. He is hopeful that an improvement in his pain will translate into an improvement in his energy levels.    Mr. Pinkham  relies on his faith as a source of strength in coping with his illness. He states that he does not want to claim having cancer and intentionally turns off the television when there is content on cancer or death. He is trying to enjoy each day of his life and not look at cancer as a death sentence. I discussed the importance of continued conversation with family and their medical providers regarding overall plan of care and treatment options, ensuring decisions are within the context of the patients values and GOCs. Assessment & Plan Pain management Pain managed with Tylenol  and tramadol  as needed. Does not require frequent use. Intermittent pain, less severe. Transitioning to Lyrica  for evaluation. - Continue Tylenol  as needed. - Increase Lyrica  to 50mg  twice daily or 2 capsules at bedtime.  -Tramadol  as needed.   Constipation Mild constipation managed with Miralax. - Continue Miralax as needed.  Follow-up Follow-up with  patient in 4-6 weeks in collaboration with oncology visits. Sooner if needed.  Patient expressed understanding and was in agreement with this plan. He also understands that He can call the clinic at any time with any questions, concerns, or complaints.   Any controlled substances utilized were prescribed in the context of palliative care. PDMP has been reviewed.   I provided 25 minutes of non face-to-face telephone visit time during this encounter, and > 50% was spent counseling as documented under my assessment & plan. Visit consisted of counseling and education dealing with the complex and emotionally intense issues of symptom management and palliative care in the setting of serious and potentially life-threatening illness.  Levon Borer, AGPCNP-BC  Palliative Medicine Team/[facility]

## 2024-06-16 ENCOUNTER — Other Ambulatory Visit (HOSPITAL_COMMUNITY): Payer: Self-pay

## 2024-06-23 ENCOUNTER — Other Ambulatory Visit: Payer: Self-pay

## 2024-07-01 ENCOUNTER — Inpatient Hospital Stay

## 2024-07-01 ENCOUNTER — Other Ambulatory Visit: Payer: Self-pay

## 2024-07-01 ENCOUNTER — Inpatient Hospital Stay: Attending: Nurse Practitioner

## 2024-07-01 DIAGNOSIS — I129 Hypertensive chronic kidney disease with stage 1 through stage 4 chronic kidney disease, or unspecified chronic kidney disease: Secondary | ICD-10-CM | POA: Diagnosis not present

## 2024-07-01 DIAGNOSIS — D649 Anemia, unspecified: Secondary | ICD-10-CM

## 2024-07-01 DIAGNOSIS — N1831 Chronic kidney disease, stage 3a: Secondary | ICD-10-CM | POA: Insufficient documentation

## 2024-07-01 DIAGNOSIS — C61 Malignant neoplasm of prostate: Secondary | ICD-10-CM

## 2024-07-01 DIAGNOSIS — C7951 Secondary malignant neoplasm of bone: Secondary | ICD-10-CM | POA: Diagnosis not present

## 2024-07-01 DIAGNOSIS — Z95828 Presence of other vascular implants and grafts: Secondary | ICD-10-CM

## 2024-07-01 LAB — CBC WITH DIFFERENTIAL (CANCER CENTER ONLY)
Abs Immature Granulocytes: 0.01 K/uL (ref 0.00–0.07)
Basophils Absolute: 0 K/uL (ref 0.0–0.1)
Basophils Relative: 0 %
Eosinophils Absolute: 0.1 K/uL (ref 0.0–0.5)
Eosinophils Relative: 4 %
HCT: 27.9 % — ABNORMAL LOW (ref 39.0–52.0)
Hemoglobin: 9 g/dL — ABNORMAL LOW (ref 13.0–17.0)
Immature Granulocytes: 0 %
Lymphocytes Relative: 20 %
Lymphs Abs: 0.5 K/uL — ABNORMAL LOW (ref 0.7–4.0)
MCH: 28.6 pg (ref 26.0–34.0)
MCHC: 32.3 g/dL (ref 30.0–36.0)
MCV: 88.6 fL (ref 80.0–100.0)
Monocytes Absolute: 0.3 K/uL (ref 0.1–1.0)
Monocytes Relative: 12 %
Neutro Abs: 1.5 K/uL — ABNORMAL LOW (ref 1.7–7.7)
Neutrophils Relative %: 64 %
Platelet Count: 192 K/uL (ref 150–400)
RBC: 3.15 MIL/uL — ABNORMAL LOW (ref 4.22–5.81)
RDW: 14 % (ref 11.5–15.5)
WBC Count: 2.4 K/uL — ABNORMAL LOW (ref 4.0–10.5)
nRBC: 0 % (ref 0.0–0.2)

## 2024-07-01 LAB — CMP (CANCER CENTER ONLY)
ALT: 6 U/L (ref 0–44)
AST: 9 U/L — ABNORMAL LOW (ref 15–41)
Albumin: 3.7 g/dL (ref 3.5–5.0)
Alkaline Phosphatase: 98 U/L (ref 38–126)
Anion gap: 7 (ref 5–15)
BUN: 23 mg/dL — ABNORMAL HIGH (ref 6–20)
CO2: 25 mmol/L (ref 22–32)
Calcium: 9.3 mg/dL (ref 8.9–10.3)
Chloride: 109 mmol/L (ref 98–111)
Creatinine: 1.38 mg/dL — ABNORMAL HIGH (ref 0.61–1.24)
GFR, Estimated: 59 mL/min — ABNORMAL LOW (ref >60)
Glucose, Bld: 104 mg/dL — ABNORMAL HIGH (ref 70–99)
Potassium: 3.7 mmol/L (ref 3.5–5.1)
Sodium: 141 mmol/L (ref 135–145)
Total Bilirubin: 0.3 mg/dL (ref 0.0–1.2)
Total Protein: 6.7 g/dL (ref 6.5–8.1)

## 2024-07-01 LAB — SAMPLE TO BLOOD BANK

## 2024-07-01 MED ORDER — SODIUM CHLORIDE 0.9% FLUSH
10.0000 mL | Freq: Once | INTRAVENOUS | Status: AC
  Administered 2024-07-01: 10 mL

## 2024-07-02 LAB — PSA, TOTAL AND FREE
PSA, Free Pct: 22 %
PSA, Free: 2.09 ng/mL
Prostate Specific Ag, Serum: 9.5 ng/mL — ABNORMAL HIGH (ref 0.0–4.0)

## 2024-07-07 ENCOUNTER — Encounter (HOSPITAL_COMMUNITY)

## 2024-07-07 NOTE — Assessment & Plan Note (Addendum)
 He is getting ADT and will be getting Xgeva at [facility] in March pending dental work Continue Vit D 2000 international units and calcium 1200mg  daily Control BP and CV risk management

## 2024-07-07 NOTE — Assessment & Plan Note (Addendum)
 Creatinine stable relative to baseline Fluid goal 64 oz per day

## 2024-07-07 NOTE — Progress Notes (Unsigned)
 [facility] OFFICE PROGRESS NOTE  Patient Care Team: Oley Bascom RAMAN, NP as PCP - General (Pulmonary Disease) Vertell Pont, RN as Oncology Nurse Navigator Pickenpack-Cousar, Fannie SAILOR, NP as Nurse Practitioner (Hospice and Palliative Medicine) Lanny Callander, MD as Consulting Physician (Hematology and Oncology)  58 y.o. m. With history of HTN, OSA prostate cancer here for follow up. Currently undergoing treatment for mCRPC.   Current diagnosis: mCRPC with bone metastases Initial diagnosis: GS 4+5 GG5 PSA 42 with bone metastases Germline testing: [employer] genetics BRIP1 p. [id number] Variant, Likely Benign Somatic testing: negative for HRRm, neg for dMMR. TMB 1 Muts/Mb Previous treatment:  ADT (at [facility] every 6 months with Dr. Renelda), ARPI (was given apalutamide  but never started, enza 06/2022-08/2023) PSA nadir 12/16/22 at 0.3 08/05/23 - 03/05/24: docetaxel . Completed 11 cycles with progression. Current treatment: 04/14/24 C1 Pluvicto . Pretreatment PSA 55 05/26/24 C2  PSA down to 9.5. Next treatment on 7/31. Assessment & Plan Metastatic castration-resistant adenocarcinoma of prostate (HCC) Completed C2 Pluvicto  Labs in 1 week before each dose Continue ADT at [facility]. Stage 3a chronic kidney disease (CKD) (HCC) Creatinine stable relative to baseline Fluid goal 64 oz per day Neuropathy Will stop AM  gabapentin  for 3 days, then stop PM dose and start Lyrica  50 mg nightly. At risk for side effect of medication He is getting ADT and will be getting Xgeva at [facility] in March pending dental work Continue Vit D 2000 international units and calcium 1200mg  daily Control BP and CV risk management  No orders of the defined types were placed in this encounter.    [name], MD  INTERVAL HISTORY: Patient returns for follow-up.  Oncology History Overview Note   Cancer Staging  Prostate cancer [facility]) Staging form: Prostate, AJCC 8th Edition - Clinical: Stage IVB (cTX, cNX,  pM1b, PSA: 42, Grade Group: 5) - Signed by Amadeo Windell SAILOR, MD on 07/08/2022 Prostate specific antigen (PSA) range: 20 or greater Histologic grading system: 5 grade system     Prostate cancer (HCC) (Resolved)  12/21/2019 Initial Diagnosis   Prostate cancer (HCC)   07/08/2022 Cancer Staging   Staging form: Prostate, AJCC 8th Edition - Clinical: Stage IVB (cTX, cNX, pM1b, PSA: 42, Grade Group: 5) - Signed by Amadeo Windell SAILOR, MD on 07/08/2022 Prostate specific antigen (PSA) range: 20 or greater Histologic grading system: 5 grade system   Metastasis to bone (HCC) (Resolved)  07/08/2022 Initial Diagnosis   Metastasis to bone (HCC)   07/16/2023 PET scan    IMPRESSION: 1. Mixed response to therapy. 2. Interval decrease in size of the prostate gland with mild residual tracer uptake within the right posterior gland. 3. Interval decrease in size of thoracic and abdominal adenopathy. 4. Persistent, multifocal tracer avid bone metastases. Overall, the tracer avid lesions appear increased in size and degree of tracer uptake. Lesion within the right calvarium has resolved in the interval.    Miscellaneous   [facility]      Metastatic castration-resistant adenocarcinoma of prostate (HCC)  07/08/2022 Initial Diagnosis   Malignant neoplasm of prostate metastatic to lymph nodes of multiple sites [facility])   08/05/2023 - 03/05/2024 Chemotherapy   Patient is on Treatment Plan : PROSTATE Docetaxel  (75) + Prednisone  q21d     12/28/2023 PET scan   1. Partial response to therapy.  No disease progression. 2. Decreased size and radiotracer activity of RIGHT iliac and retroperitoneal adenopathy. 3. No change in number or pattern of metastatic skeletal disease; however, significant interval decrease in  radiotracer activity and increased sclerosis. Lesions remain intensely radiotracer avid but again decreased in avidity 4. No evidence of visceral metastasis or pulmonary metastasis.   01/20/2024 Cancer  Staging   Staging form: Prostate, AJCC 8th Edition - Clinical: Stage IVB (cTX, cN1, pM1b) - Signed by Tina Pauletta BROCKS, MD on 01/20/2024      PHYSICAL EXAMINATION: ECOG PERFORMANCE STATUS: {CHL ONC ECOG ED:[id number]}  There were no vitals filed for this visit. There were no vitals filed for this visit.  GENERAL: alert, no distress and comfortable SKIN: skin color normal and no bruising or petechiae or jaundice on exposed skin EYES: normal, sclera clear OROPHARYNX: no exudate  NECK: No palpable mass LYMPH:  no palpable cervical, axillary lymphadenopathy  LUNGS: clear to auscultation and percussion with normal breathing effort HEART: regular rate & rhythm  ABDOMEN: abdomen soft, non-tender and nondistended. Musculoskeletal: no edema NEURO: no focal motor/sensory deficits  Relevant data reviewed during this visit included ***

## 2024-07-07 NOTE — Assessment & Plan Note (Addendum)
 Completed C2 Pluvicto  Labs in 1 week before each dose Continue ADT at [facility].

## 2024-07-07 NOTE — Assessment & Plan Note (Addendum)
 Will stop AM  gabapentin  for 3 days, then stop PM dose and start Lyrica  50 mg nightly.

## 2024-07-08 ENCOUNTER — Other Ambulatory Visit: Payer: Self-pay

## 2024-07-08 ENCOUNTER — Inpatient Hospital Stay (HOSPITAL_BASED_OUTPATIENT_CLINIC_OR_DEPARTMENT_OTHER)

## 2024-07-08 VITALS — BP 106/79 | Temp 97.0°F | Resp 17 | Ht 70.0 in | Wt 247.0 lb

## 2024-07-08 DIAGNOSIS — D649 Anemia, unspecified: Secondary | ICD-10-CM | POA: Diagnosis not present

## 2024-07-08 DIAGNOSIS — N1831 Chronic kidney disease, stage 3a: Secondary | ICD-10-CM | POA: Diagnosis not present

## 2024-07-08 DIAGNOSIS — Z9189 Other specified personal risk factors, not elsewhere classified: Secondary | ICD-10-CM

## 2024-07-08 DIAGNOSIS — I129 Hypertensive chronic kidney disease with stage 1 through stage 4 chronic kidney disease, or unspecified chronic kidney disease: Secondary | ICD-10-CM | POA: Diagnosis not present

## 2024-07-08 DIAGNOSIS — C61 Malignant neoplasm of prostate: Secondary | ICD-10-CM | POA: Diagnosis not present

## 2024-07-08 DIAGNOSIS — Z192 Hormone resistant malignancy status: Secondary | ICD-10-CM | POA: Diagnosis not present

## 2024-07-08 DIAGNOSIS — G629 Polyneuropathy, unspecified: Secondary | ICD-10-CM

## 2024-07-08 DIAGNOSIS — C7951 Secondary malignant neoplasm of bone: Secondary | ICD-10-CM | POA: Diagnosis not present

## 2024-07-08 NOTE — Assessment & Plan Note (Signed)
Repeat labs in 6 weeks.

## 2024-07-12 ENCOUNTER — Other Ambulatory Visit (HOSPITAL_COMMUNITY)

## 2024-07-21 ENCOUNTER — Ambulatory Visit (HOSPITAL_COMMUNITY)
Admission: RE | Admit: 2024-07-21 | Discharge: 2024-07-21 | Disposition: A | Discharge status: Home / Self Care | Source: Ambulatory Visit

## 2024-07-21 VITALS — BP 119/82 | HR 76 | Resp 15

## 2024-07-21 DIAGNOSIS — D649 Anemia, unspecified: Secondary | ICD-10-CM | POA: Diagnosis not present

## 2024-07-21 DIAGNOSIS — N400 Enlarged prostate without lower urinary tract symptoms: Secondary | ICD-10-CM | POA: Insufficient documentation

## 2024-07-21 DIAGNOSIS — C61 Malignant neoplasm of prostate: Secondary | ICD-10-CM | POA: Insufficient documentation

## 2024-07-21 DIAGNOSIS — R972 Elevated prostate specific antigen [PSA]: Secondary | ICD-10-CM | POA: Insufficient documentation

## 2024-07-21 DIAGNOSIS — Z192 Hormone resistant malignancy status: Secondary | ICD-10-CM | POA: Insufficient documentation

## 2024-07-21 DIAGNOSIS — N289 Disorder of kidney and ureter, unspecified: Secondary | ICD-10-CM | POA: Diagnosis not present

## 2024-07-21 LAB — BASIC METABOLIC PANEL WITH GFR
Anion gap: 10 (ref 5–15)
BUN: 31 mg/dL — ABNORMAL HIGH (ref 6–20)
CO2: 18 mmol/L — ABNORMAL LOW (ref 22–32)
Calcium: 8.7 mg/dL — ABNORMAL LOW (ref 8.9–10.3)
Chloride: 108 mmol/L (ref 98–111)
Creatinine, Ser: 1.44 mg/dL — ABNORMAL HIGH (ref 0.61–1.24)
GFR, Estimated: 56 mL/min — ABNORMAL LOW (ref >60)
Glucose, Bld: 102 mg/dL — ABNORMAL HIGH (ref 70–99)
Potassium: 3.5 mmol/L (ref 3.5–5.1)
Sodium: 136 mmol/L (ref 135–145)

## 2024-07-21 MED ORDER — SODIUM CHLORIDE 0.9 % IV BOLUS
1000.0000 mL | Freq: Once | INTRAVENOUS | Status: AC
  Administered 2024-07-21: 1000 mL via INTRAVENOUS

## 2024-07-21 MED ORDER — LUTETIUM LU 177 VIPIVOTIDE TET 1000 MBQ/ML IV SOLN
207.0000 | Freq: Once | INTRAVENOUS | Status: AC
  Administered 2024-07-21: 207 via INTRAVENOUS

## 2024-07-21 NOTE — Progress Notes (Signed)
 Post-pluvicto  vital signs

## 2024-07-21 NOTE — Progress Notes (Signed)
 CLINICAL DATA: [58 year old male with metastatic prostate adenocarcinoma.]    EXAM: NUCLEAR MEDICINE PLUVICTO  INJECTION  TECHNIQUE: Infusion: The nuclear medicine technologist and I personally verified the dose activity to be delivered as specified in the written directive, and verified the patient identification via 2 separate methods.  Initial flush of the intravenous catheter was performed was sterile saline. The dose syringe was connected to the catheter and the Lu-177 Pluvicto  administered over a 1 to 10 min infusion. Single 10 cc  lushes with normal saline follow the dose. No complications were noted. The entire IV tubing, venocatheter, stopcock and syringes was removed in total, placed in a disposal bag and sent for assay of the residual activity, which will be reported at a later time in our EMR by the physics staff. Pressure was applied to the venipuncture site, and a compression bandage placed. Patient monitored for 1 hour following infusion.    Radiation Safety personnel were present to perform the discharge survey, as detailed on their documentation. After a short period of observation, the patient had his IV removed.  RADIOPHARMACEUTICALS: [Two hundred seven] microcuries Lu-177 PLUVICTO   FINDINGS: Current Infusion: [3]  Planned Infusions: 6    Patient presented to nuclear medicine for treatment.    Patient reports continued relief from bone pain.  One episode of nausea.     Mild renal insufficiency with creatinine equal 1.3.  Patient advised to hydrate.       Mild anemia stable at 9.0    PSA equal 9.5 decreased from 24.5 two  months prior         The patient's most recent blood counts were reviewed and remains a good candidate to proceed with Lu-177 Pluvicto . The patient was situated in an infusion suite with a contact barrier placed under the arm. Intravenous access was established, using sterile technique, and a normal saline infusion from a syringe  was started.     Micro-dosimetry: The prescribed radiation activity was assayed and confirmed to be within specified tolerance.  IMPRESSION: Current Infusion: [3]  Planned Infusions: 6    [The patient tolerated the infusion well. The patient will return in 6 weeks for ongoing care.]

## 2024-07-21 NOTE — Written Directive (Addendum)
  PLUVICTO   THERAPY   RADIOPHARMACEUTICAL: Lutetium 177 vipivotide tetraxetan (Pluvicto )     PRESCRIBED DOSE FOR ADMINISTRATION:  200 mCi   ROUTE OFADMINISTRATION:  IV   DIAGNOSIS: Metastatic castration resistant prostate cancer    REFERRING PHYSICIAN: Donnajean Chihuahua, MD   TREATMENT #: 3   ADDITIONAL PHYSICIAN COMMENTS/NOTES:    AUTHORIZED USER SIGNATURE & TIME STAMP: Norleen GORMAN Boxer, MD   07/21/24    8:38 AM

## 2024-07-21 NOTE — Progress Notes (Signed)
 Pre pluvicto  vital signs.

## 2024-07-22 ENCOUNTER — Other Ambulatory Visit: Payer: Self-pay

## 2024-08-04 ENCOUNTER — Inpatient Hospital Stay: Attending: Nurse Practitioner | Admitting: Nurse Practitioner

## 2024-08-04 ENCOUNTER — Encounter: Payer: Self-pay | Admitting: Nurse Practitioner

## 2024-08-04 DIAGNOSIS — C61 Malignant neoplasm of prostate: Secondary | ICD-10-CM | POA: Diagnosis not present

## 2024-08-04 DIAGNOSIS — Z192 Hormone resistant malignancy status: Secondary | ICD-10-CM

## 2024-08-04 DIAGNOSIS — M792 Neuralgia and neuritis, unspecified: Secondary | ICD-10-CM | POA: Diagnosis not present

## 2024-08-04 DIAGNOSIS — Z515 Encounter for palliative care: Secondary | ICD-10-CM | POA: Diagnosis not present

## 2024-08-04 NOTE — Progress Notes (Signed)
 Palliative Medicine [facility]  [phone] [phone]   Name: Craig Collins Date: 08/04/2024 MRN: [id number]  DOB: [birth year]  Patient Care Team: Oley Bascom RAMAN, NP as PCP - General (Pulmonary Disease) Vertell Pont, RN as Oncology Nurse Navigator Pickenpack-Cousar, Fannie SAILOR, NP as Nurse Practitioner (Hospice and Palliative Medicine) Lanny Callander, MD as Consulting Physician (Hematology and Oncology)   I connected with Craig Collins on 08/04/24 at 12:30 PM EDT by telephone and verified that I am speaking with the correct person using two identifiers.   I discussed the limitations, risks, security and privacy concerns of performing an evaluation and management service by telemedicine and the availability of in-person appointments. I also discussed with the patient that there may be a patient responsible charge related to this service. The patient expressed understanding and agreed to proceed.   Other persons participating in the visit and their role in the encounter: N/A   Patient's location: Home   Provider's location: [facility]   INTERVAL HISTORY: Craig Collins is a 58 y.o. 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 Palliative ask to see for symptom and pain management and goals of care.   SOCIAL HISTORY:     reports that he has never smoked. He has never been exposed to tobacco smoke. He has never used smokeless tobacco. He reports that he does not currently use alcohol. He reports that he does not use drugs.  ADVANCE DIRECTIVES:  None on file   CODE STATUS: Full code  PAST MEDICAL HISTORY: Past Medical History:  Diagnosis Date   Benign prostate hyperplasia    Elevated PSA    Erectile dysfunction    Hypertension    Obesity (BMI 35.0-39.9 without comorbidity)    Prostate cancer  (HCC) 05/2019   Sleep apnea    Urinary retention     ALLERGIES:  has no known allergies.  MEDICATIONS:  Current Outpatient Medications  Medication Sig Dispense Refill   albuterol (VENTOLIN HFA) 108 (90 Base) MCG/ACT inhaler Inhale 2 puffs into the lungs every 6 (six) hours as needed for wheezing or shortness of breath. 18 g 6   ascorbic acid (VITAMIN C) 500 MG tablet Take 500 mg by mouth daily.     Calcium Carbonate-Vitamin D (CALCIUM 500/D) 500-125 MG-UNIT TABS Take 1 tablet by mouth daily.     cetirizine (ZYRTEC) 10 MG tablet Take 1 tablet (10 mg total) by mouth daily. 30 tablet 11   cyanocobalamin 1000 MCG tablet Take 2 tablets (2,000 mcg total) by mouth daily. 30 tablet 0   fluticasone (FLONASE) 50 MCG/ACT nasal spray Place 2 sprays into both nostrils daily. 48 g 0   hydrochlorothiazide (HYDRODIURIL) 25 MG tablet TAKE 1/2 TABLET (12.5 MG TOTAL) BY MOUTH DAILY. 45 tablet 3   lisinopril (ZESTRIL) 20 MG tablet Take 1 tablet (20 mg total) by mouth daily. 90 tablet 3   metoCLOPramide (REGLAN) 10 MG tablet Take 1 tablet (10 mg total) by mouth every 6 (six) hours as needed for nausea. 90 tablet 3   nystatin cream (MYCOSTATIN) Apply 1 Application topically 2 (two) times daily. 30 g 1   sodium chloride (OCEAN) 0.65 % SOLN nasal spray Place 1 spray into both nostrils as needed for congestion. 30 mL 0   tadalafil (CIALIS) 20 MG tablet Take 1 tablet (20 mg total) by mouth daily as needed. 10 tablet 1  tamsulosin  (FLOMAX ) 0.4 MG CAPS capsule Take 1 capsule (0.4 mg total) by mouth daily. 30 capsule 3   traMADol  (ULTRAM ) 50 MG tablet Take 1 - 2 tablets (50 - 100 mg total) by mouth every 6 hours as needed for moderate pain or severe pain. 90 tablet 0   VIAGRA  25 MG tablet Take 1 tablet (25 mg total) by mouth daily as needed for erectile dysfunction. 10 tablet 0   No current facility-administered medications for this visit.    VITAL SIGNS: There were no vitals taken for this visit. There were no  vitals filed for this visit.  Estimated body mass index is 35.44 kg/m as calculated from the following:   Height as of 07/08/24: 5' 10 (1.778 m).   Weight as of 07/08/24: 247 lb (112 kg).   PERFORMANCE STATUS (ECOG) : 1 - Symptomatic but completely ambulatory  IMPRESSION: Discussed the use of AI scribe software for clinical note transcription with the patient, who gave verbal consent to proceed.  History of Present Illness I connected by phone with Craig Collins for symptom management follow-up. No acute distress noted. Denies concerns with nausea, vomiting, or diarrhea. He experiences mild constipation, for which he takes Miralax, effectively alleviating the symptoms. Occasional fatigue. States he is doing well overall.   He is currently undergoing Pluvicto  treatments.  Patient inquiring about lab prior to going infusion.  Will make sure appointment has been scheduled as discussed with Dr. Tina.   No new or uncontrolled symptoms. All questions answered and support provided. Will continue to support as needed.   Goals of Care 03/24/24: Craig Collins is realistic in his understanding of current cancer state and expectations. He continues to remain hopeful for stability. Goals remain clear to continue to treat the treatable allowing him every opportunity to continue thriving. Relies on his [place] faith.   04/16/23-We discussed Craig Collins current illness and what it means in the larger context of his on-going co-morbidities. Natural disease trajectory and expectations were discussed. We discussed the importance of continued conversation with family and their medical providers regarding overall plan of care and treatment options, ensuring decisions are within the context of the patients values and GOCs.   Craig Collins relates that it has become more difficult for him to leave home, mostly due to the pain and fatigue. There are other factors as well, such as the sweating that he experiences  without warning. Patient reports this makes him feel blue. He is hopeful that an improvement in his pain will translate into an improvement in his energy levels.    Mr. Karel relies on his faith as a source of strength in coping with his illness. He states that he does not want to claim having cancer and intentionally turns off the television when there is content on cancer or death. He is trying to enjoy each day of his life and not look at cancer as a death sentence. I discussed the importance of continued conversation with family and their medical providers regarding overall plan of care and treatment options, ensuring decisions are within the context of the patients values and GOCs. Assessment & Plan Pain management Pain managed with Tylenol  and tramadol  as needed. Does not require frequent use. Intermittent pain, less severe. Transitioning to Lyrica  for evaluation. - Continue Tylenol  as needed. - Increase Lyrica  to 50mg  twice daily or 2 capsules at bedtime.  -Tramadol  as needed.   Constipation Mild constipation managed with Miralax. - Continue Miralax as needed.  Follow-up Follow-up with patient in 4-6 weeks in collaboration with oncology visits. Sooner if needed.  Patient expressed understanding and was in agreement with this plan. He also understands that He can call the clinic at any time with any questions, concerns, or complaints.   Any controlled substances utilized were prescribed in the context of palliative care. PDMP has been reviewed.   I provided 25 minutes of non face-to-face telephone visit time during this encounter, and > 50% was spent counseling as documented under my assessment & plan. Visit consisted of counseling and education dealing with the complex and emotionally intense issues of symptom management and palliative care in the setting of serious and potentially life-threatening illness.  Levon Borer, AGPCNP-BC  Palliative Medicine Team/[facility]

## 2024-08-17 ENCOUNTER — Inpatient Hospital Stay

## 2024-08-17 DIAGNOSIS — C778 Secondary and unspecified malignant neoplasm of lymph nodes of multiple regions: Secondary | ICD-10-CM | POA: Diagnosis not present

## 2024-08-17 DIAGNOSIS — C61 Malignant neoplasm of prostate: Secondary | ICD-10-CM

## 2024-08-17 DIAGNOSIS — D649 Anemia, unspecified: Secondary | ICD-10-CM

## 2024-08-17 DIAGNOSIS — C7951 Secondary malignant neoplasm of bone: Secondary | ICD-10-CM | POA: Diagnosis not present

## 2024-08-17 LAB — CMP (CANCER CENTER ONLY)
ALT: 7 U/L (ref 0–44)
AST: 11 U/L — ABNORMAL LOW (ref 15–41)
Albumin: 4.1 g/dL (ref 3.5–5.0)
Alkaline Phosphatase: 98 U/L (ref 38–126)
Anion gap: 7 (ref 5–15)
BUN: 28 mg/dL — ABNORMAL HIGH (ref 6–20)
CO2: 25 mmol/L (ref 22–32)
Calcium: 9.4 mg/dL (ref 8.9–10.3)
Chloride: 107 mmol/L (ref 98–111)
Creatinine: 1.38 mg/dL — ABNORMAL HIGH (ref 0.61–1.24)
GFR, Estimated: 59 mL/min — ABNORMAL LOW (ref >60)
Glucose, Bld: 100 mg/dL — ABNORMAL HIGH (ref 70–99)
Potassium: 3.7 mmol/L (ref 3.5–5.1)
Sodium: 139 mmol/L (ref 135–145)
Total Bilirubin: 0.3 mg/dL (ref 0.0–1.2)
Total Protein: 7.1 g/dL (ref 6.5–8.1)

## 2024-08-17 LAB — CBC WITH DIFFERENTIAL (CANCER CENTER ONLY)
Abs Immature Granulocytes: 0.01 K/uL (ref 0.00–0.07)
Basophils Absolute: 0 K/uL (ref 0.0–0.1)
Basophils Relative: 0 %
Eosinophils Absolute: 0.1 K/uL (ref 0.0–0.5)
Eosinophils Relative: 3 %
HCT: 28.8 % — ABNORMAL LOW (ref 39.0–52.0)
Hemoglobin: 9.4 g/dL — ABNORMAL LOW (ref 13.0–17.0)
Immature Granulocytes: 0 %
Lymphocytes Relative: 15 %
Lymphs Abs: 0.4 K/uL — ABNORMAL LOW (ref 0.7–4.0)
MCH: 28.7 pg (ref 26.0–34.0)
MCHC: 32.6 g/dL (ref 30.0–36.0)
MCV: 87.8 fL (ref 80.0–100.0)
Monocytes Absolute: 0.5 K/uL (ref 0.1–1.0)
Monocytes Relative: 16 %
Neutro Abs: 2 K/uL (ref 1.7–7.7)
Neutrophils Relative %: 66 %
Platelet Count: 173 K/uL (ref 150–400)
RBC: 3.28 MIL/uL — ABNORMAL LOW (ref 4.22–5.81)
RDW: 13.5 % (ref 11.5–15.5)
WBC Count: 3 K/uL — ABNORMAL LOW (ref 4.0–10.5)
nRBC: 0 % (ref 0.0–0.2)

## 2024-08-17 LAB — SAMPLE TO BLOOD BANK

## 2024-08-18 ENCOUNTER — Other Ambulatory Visit: Payer: Self-pay

## 2024-08-18 ENCOUNTER — Other Ambulatory Visit: Payer: Self-pay | Admitting: Nurse Practitioner

## 2024-08-18 ENCOUNTER — Telehealth: Payer: Self-pay

## 2024-08-18 ENCOUNTER — Encounter (HOSPITAL_COMMUNITY)
Admission: RE | Admit: 2024-08-18 | Discharge: 2024-08-18 | Disposition: A | Discharge status: Home / Self Care | Source: Ambulatory Visit

## 2024-08-18 DIAGNOSIS — Z192 Hormone resistant malignancy status: Secondary | ICD-10-CM | POA: Insufficient documentation

## 2024-08-18 DIAGNOSIS — N400 Enlarged prostate without lower urinary tract symptoms: Secondary | ICD-10-CM

## 2024-08-18 DIAGNOSIS — C61 Malignant neoplasm of prostate: Secondary | ICD-10-CM | POA: Insufficient documentation

## 2024-08-18 DIAGNOSIS — R972 Elevated prostate specific antigen [PSA]: Secondary | ICD-10-CM

## 2024-08-18 DIAGNOSIS — R339 Retention of urine, unspecified: Secondary | ICD-10-CM

## 2024-08-18 LAB — TESTOSTERONE: Testosterone: 5 ng/dL — ABNORMAL LOW (ref 264–916)

## 2024-08-18 LAB — PROSTATE-SPECIFIC AG, SERUM (LABCORP): Prostate Specific Ag, Serum: 3.5 ng/mL (ref 0.0–4.0)

## 2024-08-18 MED ORDER — TAMSULOSIN HCL 0.4 MG PO CAPS
0.4000 mg | ORAL_CAPSULE | Freq: Every day | ORAL | 3 refills | Status: AC
  Filled 2024-08-18: qty 30, 30d supply, fill #0
  Filled 2024-11-02 – 2024-11-22 (×2): qty 30, 30d supply, fill #1
  Filled 2024-12-27: qty 30, 30d supply, fill #2

## 2024-08-18 NOTE — Written Directive (Addendum)
  PLUVICTO   THERAPY   RADIOPHARMACEUTICAL: Lutetium 177 vipivotide tetraxetan (Pluvicto )     PRESCRIBED DOSE FOR ADMINISTRATION:  200 mCi   ROUTE OFADMINISTRATION:  IV   DIAGNOSIS:  Metastatic castration resistant prostate cancer   REFERRING PHYSICIAN: Pauletta Chihuahua, MD   TREATMENT #: 4   ADDITIONAL PHYSICIAN COMMENTS/NOTES:   AUTHORIZED USER SIGNATURE & TIME STAMP: Norleen GORMAN Boxer, MD   09/06/24    9:26 AM

## 2024-08-23 ENCOUNTER — Other Ambulatory Visit: Payer: Self-pay

## 2024-08-27 ENCOUNTER — Other Ambulatory Visit: Payer: Self-pay

## 2024-08-27 ENCOUNTER — Ambulatory Visit
Admission: EM | Admit: 2024-08-27 | Discharge: 2024-08-27 | Disposition: A | Discharge status: Home / Self Care | Attending: Emergency Medicine | Admitting: Emergency Medicine

## 2024-08-27 ENCOUNTER — Encounter: Payer: Self-pay | Admitting: *Deleted

## 2024-08-27 DIAGNOSIS — U071 COVID-19: Secondary | ICD-10-CM

## 2024-08-27 LAB — POC SOFIA SARS ANTIGEN FIA: SARS Coronavirus 2 Ag: POSITIVE — AB

## 2024-08-27 MED ORDER — BENZONATATE 100 MG PO CAPS: 100.0000 mg | ORAL_CAPSULE | Freq: Three times a day (TID) | ORAL | 0 refills | Status: DC | PRN

## 2024-08-27 MED ORDER — ACETAMINOPHEN 325 MG PO TABS
975.0000 mg | ORAL_TABLET | Freq: Once | ORAL | Status: AC
  Administered 2024-08-27: 975 mg via ORAL

## 2024-08-27 NOTE — ED Triage Notes (Signed)
 Pt reports runny nose and slight cough x 1 day. He had been around a child who tested + covid this week. No meds taken today

## 2024-08-27 NOTE — Discharge Instructions (Addendum)
 Covid test is positive Use tylenol  every 4-6 hours for any fever/aches/pains Drink LOTS of water! You can use plain mucinex (guaifenesin) for congestion  The tessalon  cough pills can be taken 3 times daily, 1-2 pills at a time.

## 2024-08-27 NOTE — ED Provider Notes (Signed)
 [facility]    CSN: [id number] Arrival date & time: 08/27/24  0820      History   Chief Complaint Chief Complaint  Patient presents with   Nasal Congestion    HPI Craig Collins is a 58 y.o. male.  A few days ago, COVID exposure Yesterday developed runny nose and slight cough. No fever. No chills/sweats Not short of breath, wheezing, chest pain or tightness  Undergoing treatment for prostate cancer Followed by oncology Next visit in 4 days  Past Medical History:  Diagnosis Date   Benign prostate hyperplasia    Elevated PSA    Erectile dysfunction    Hypertension    Obesity (BMI 35.0-39.9 without comorbidity)    Prostate cancer (HCC) 05/2019   Sleep apnea    Urinary retention     Patient Active Problem List   Diagnosis Date Noted   Malignant neoplasm of prostate metastatic to bone (HCC) 03/18/2024   Low back pain radiating to left lower extremity 03/03/2024   Normocytic anemia 01/20/2024   At risk for side effect of medication 12/31/2023   Neuropathy 12/11/2023   Port-A-Cath in place 08/13/2023   Cancer related pain 07/21/2023   Tonsillar exudate 01/19/2023   Acute rhinosinusitis 01/19/2023   Pneumonia of left lower lobe due to infectious organism 11/17/2022   Thrush 10/22/2022   Genetic testing 09/26/2022   Family history of prostate cancer 09/11/2022   Anemia associated with chemotherapy 08/05/2022   Morbid obesity (HCC) 08/05/2022   Radiation esophagitis 08/04/2022   Hypokalemia 08/04/2022   Metastatic castration-resistant adenocarcinoma of prostate (HCC) 07/08/2022   Stage 3a chronic kidney disease (CKD) (HCC) 02/08/2022   Hypertensive urgency 06/20/2020   Muscle strain 12/21/2019   Urinary retention 04/16/2019   Benign prostatic hyperplasia with elevated prostate specific antigen (PSA) 04/16/2019   Elevated PSA 04/16/2019   Hypertension, essential 04/16/2019   Benign neoplasm of rectum    Diverticulosis of colon with hemorrhage     Rectal bleeding 10/13/2017   Acute blood loss anemia 10/13/2017   Acute renal failure (ARF) (HCC) 10/13/2017   Acute GI bleeding 10/13/2017   Hematochezia     Past Surgical History:  Procedure Laterality Date   COLONOSCOPY N/A 10/14/2017   Procedure: COLONOSCOPY;  Surgeon: Avram Lupita BRAVO, MD;  Location: [facility] ENDOSCOPY;  Service: Endoscopy;  Laterality: N/A;   ESOPHAGOGASTRODUODENOSCOPY (EGD) WITH PROPOFOL  N/A 08/04/2022   Procedure: ESOPHAGOGASTRODUODENOSCOPY (EGD) WITH PROPOFOL ;  Surgeon: Kristie Lamprey, MD;  Location: [facility] ENDOSCOPY;  Service: Gastroenterology;  Laterality: N/A;   IR IMAGING GUIDED PORT INSERTION  08/06/2023       Home Medications    Prior to Admission medications   Medication Sig Start Date End Date Taking? Authorizing Provider  benzonatate  (TESSALON ) 100 MG capsule Take 1 capsule (100 mg total) by mouth 3 (three) times daily as needed for cough. 08/27/24  Yes Kate Larock, [place], PA-C  albuterol  (VENTOLIN  HFA) 108 (90 Base) MCG/ACT inhaler Inhale 2 puffs into the lungs every 6 (six) hours as needed for wheezing or shortness of breath. 03/03/23   Gladis Leonor HERO, MD  ascorbic acid (VITAMIN C) 500 MG tablet Take 500 mg by mouth daily.    [provider]  Calcium Carbonate-Vitamin D (CALCIUM 500/D) 500-125 MG-UNIT TABS Take 1 tablet by mouth daily.    [provider]  cetirizine  (ZYRTEC ) 10 MG tablet Take 1 tablet (10 mg total) by mouth daily. 03/03/24   Oley Bascom RAMAN, NP  cyanocobalamin  1000 MCG tablet Take 2 tablets (  2,000 mcg total) by mouth daily. 06/17/23   Pickenpack-Cousar, Athena N, NP  fluticasone  (FLONASE ) 50 MCG/ACT nasal spray Place 2 sprays into both nostrils daily. 05/20/24   Oley Bascom RAMAN, NP  hydrochlorothiazide  (HYDRODIURIL ) 25 MG tablet TAKE 1/2 TABLET (12.5 MG TOTAL) BY MOUTH DAILY. 03/17/24   Oley Bascom RAMAN, NP  lisinopril  (ZESTRIL ) 20 MG tablet Take 1 tablet (20 mg total) by mouth daily. 09/04/23   Oley Bascom RAMAN, NP  metoCLOPramide   (REGLAN ) 10 MG tablet Take 1 tablet (10 mg total) by mouth every 6 (six) hours as needed for nausea. 03/24/24   Pickenpack-Cousar, Athena N, NP  nystatin  cream (MYCOSTATIN ) Apply 1 Application topically 2 (two) times daily. 10/07/23   Boscia, Heather E, NP  sodium chloride  (OCEAN) 0.65 % SOLN nasal spray Place 1 spray into both nostrils as needed for congestion. 01/19/23   Paseda, Folashade R, FNP  tadalafil  (CIALIS ) 20 MG tablet Take 1 tablet (20 mg total) by mouth daily as needed. 06/07/24     tamsulosin  (FLOMAX ) 0.4 MG CAPS capsule Take 1 capsule (0.4 mg total) by mouth daily. 08/18/24   Oley Bascom RAMAN, NP  traMADol  (ULTRAM ) 50 MG tablet Take 1 - 2 tablets (50 - 100 mg total) by mouth every 6 hours as needed for moderate pain or severe pain. 11/18/23   Pickenpack-Cousar, Fannie SAILOR, NP  VIAGRA  25 MG tablet Take 1 tablet (25 mg total) by mouth daily as needed for erectile dysfunction. 10/22/22   Oley Bascom RAMAN, NP    Family History Family History  Problem Relation Age of Onset   Leukemia Mother 36 - 39   Hypertension Brother    Prostate cancer Maternal Uncle    Prostate cancer Maternal Grandfather    Hypertension Other    Prostate cancer Cousin        maternal first cousin   Colon cancer Neg Hx     Social History Social History   Tobacco Use   Smoking status: Never    Passive exposure: Never   Smokeless tobacco: Never  Vaping Use   Vaping status: Never Used  Substance Use Topics   Alcohol use: Not Currently   Drug use: No     Allergies   Patient has no known allergies.   Review of Systems Review of Systems   Physical Exam Triage Vital Signs ED Triage Vitals  Encounter Vitals Group     BP 08/27/24 0935 122/81     Girls Systolic BP Percentile --      Girls Diastolic BP Percentile --      Boys Systolic BP Percentile --      Boys Diastolic BP Percentile --      Pulse Rate 08/27/24 0935 (!) 111     Resp 08/27/24 0935 18     Temp 08/27/24 0935 99.7 F (37.6 C)      Temp Source 08/27/24 0935 Oral     SpO2 08/27/24 0935 97 %     Weight --      Height --      Head Circumference --      Peak Flow --      Pain Score 08/27/24 0930 1     Pain Loc --      Pain Education --      Exclude from Growth Chart --    No data found.  Updated Vital Signs BP 122/81 (BP Location: Left Arm)   Pulse (!) 102   Temp 99.7 F (37.6 C) (Oral)  Resp 18   SpO2 97%   TEMP RECHECK 99.0 BEFORE MEDICINE  Physical Exam Vitals and nursing note reviewed.  Constitutional:      General: He is not in acute distress.    Appearance: He is not diaphoretic.  HENT:     Right Ear: Tympanic membrane and ear canal normal.     Left Ear: Tympanic membrane and ear canal normal.     Nose: Congestion present. No rhinorrhea.     Mouth/Throat:     Mouth: Mucous membranes are moist.     Pharynx: Oropharynx is clear. No posterior oropharyngeal erythema.  Eyes:     Conjunctiva/sclera: Conjunctivae normal.     Pupils: Pupils are equal, round, and reactive to light.  Cardiovascular:     Rate and Rhythm: Normal rate and regular rhythm.     Pulses: Normal pulses.     Heart sounds: Normal heart sounds.  Pulmonary:     Effort: Pulmonary effort is normal. No respiratory distress.     Breath sounds: Normal breath sounds. No wheezing, rhonchi or rales.  Abdominal:     Palpations: Abdomen is soft.     Tenderness: There is no abdominal tenderness.  Musculoskeletal:     Cervical back: Normal range of motion. No rigidity or tenderness.  Lymphadenopathy:     Cervical: No cervical adenopathy.  Skin:    General: Skin is warm and dry.  Neurological:     Mental Status: He is alert and oriented to person, place, and time.     UC Treatments / Results  Labs (all labs ordered are listed, but only abnormal results are displayed) Labs Reviewed  POC SOFIA SARS ANTIGEN FIA - Abnormal; Notable for the following components:      Result Value   SARS Coronavirus 2 Ag Positive (*)    All other  components within normal limits    EKG   Radiology No results found.  Procedures Procedures (including critical care time)  Medications Ordered in UC Medications  acetaminophen  (TYLENOL ) tablet 975 mg (975 mg Oral Given 08/27/24 0958)    Initial Impression / Assessment and Plan / UC Course  I have reviewed the triage vital signs and the nursing notes.  Pertinent labs & imaging results that were available during my care of the patient were reviewed by me and considered in my medical decision making (see chart for details).   Initial temp 99.7 but recheck before intervention is 99.0 Tachycardic 111, down to 102. Reports he usually has a resting heart rate in the high 90s. Tylenol  dose given in clinic Positive covid test.  Supportive care at home. Try tessalon  TID prn for cough. We had discussed antivirals and they were offered, patient declines at this time.  Discussed monitoring for fever. If occurs, needs emergency department eval since as is undergoing cancer treatment; neutropenic fever workup. Other ED precautions No questions at this time, agrees to plan  Final Clinical Impressions(s) / UC Diagnoses   Final diagnoses:  COVID-19     Discharge Instructions      Covid test is positive Use tylenol  every 4-6 hours for any fever/aches/pains Drink LOTS of water! You can use plain mucinex (guaifenesin) for congestion  The tessalon  cough pills can be taken 3 times daily, 1-2 pills at a time.     ED Prescriptions     Medication Sig Dispense Auth. Provider   benzonatate  (TESSALON ) 100 MG capsule Take 1 capsule (100 mg total) by mouth 3 (three) times daily as needed  for cough. 30 capsule Jesselee Poth, [place], PA-C      PDMP not reviewed this encounter.   Thaddaeus Granja, [place], [place] 08/27/24 1103

## 2024-08-30 ENCOUNTER — Telehealth: Payer: Self-pay | Admitting: *Deleted

## 2024-08-30 NOTE — Telephone Encounter (Signed)
 Craig Collins states he was diagnosed with Covid on Saturday. No fever at present, only stuffy nose. Calling to check on appts. Dr Tina notified.

## 2024-08-30 NOTE — Telephone Encounter (Signed)
 Spoke with NM. They are OK to proceed with Pluvicto  on 9/19 as it will have been 10 days from diagnosis. Dr Tina is OK to cancel labs and see him 3-4 weeks after Pluvicto  with labs.   Pt notified

## 2024-09-01 ENCOUNTER — Other Ambulatory Visit

## 2024-09-02 ENCOUNTER — Telehealth: Payer: Self-pay

## 2024-09-02 ENCOUNTER — Ambulatory Visit

## 2024-09-02 NOTE — Telephone Encounter (Signed)
 Scheduled appointments per 9/11 secure chat. Called and left a VM with appointment details for the patient.

## 2024-09-05 ENCOUNTER — Ambulatory Visit: Payer: Self-pay | Admitting: Nurse Practitioner

## 2024-09-05 ENCOUNTER — Encounter: Payer: Self-pay | Admitting: Nurse Practitioner

## 2024-09-05 ENCOUNTER — Other Ambulatory Visit: Payer: Self-pay

## 2024-09-05 VITALS — BP 90/65 | HR 98 | Temp 98.2°F | Wt 239.6 lb

## 2024-09-05 DIAGNOSIS — I499 Cardiac arrhythmia, unspecified: Secondary | ICD-10-CM

## 2024-09-05 DIAGNOSIS — J302 Other seasonal allergic rhinitis: Secondary | ICD-10-CM

## 2024-09-05 DIAGNOSIS — J019 Acute sinusitis, unspecified: Secondary | ICD-10-CM | POA: Diagnosis not present

## 2024-09-05 MED ORDER — FLUTICASONE PROPIONATE 50 MCG/ACT NA SUSP
2.0000 | Freq: Every day | NASAL | 0 refills | Status: AC
  Filled 2024-09-05: qty 48, 90d supply, fill #0

## 2024-09-05 MED ORDER — CETIRIZINE HCL 10 MG PO TABS
10.0000 mg | ORAL_TABLET | Freq: Every day | ORAL | 11 refills | Status: AC
  Filled 2024-09-05: qty 30, 30d supply, fill #0

## 2024-09-05 NOTE — Progress Notes (Signed)
 Subjective   Patient ID: Craig Collins, male    DOB: 01-09-1966, 58 y.o.   MRN: [id number]  Chief Complaint  Patient presents with   Medical Management of Chronic Issues    Referring provider: Oley Bascom RAMAN, NP  Craig Collins is a 58 y.o. male with Past Medical History: No date: Benign prostate hyperplasia No date: Elevated PSA No date: Erectile dysfunction No date: Hypertension No date: Obesity (BMI 35.0-39.9 without comorbidity) 05/2019: Prostate cancer (HCC) No date: Sleep apnea No date: Urinary retention   HPI  Patient presents today for follow-up visit. He does continue to follow with oncology for prostate cancer. Currently undergoing chemo. Is requesting medications for allergies. He is up-to-date on blood work through oncology.  Patient's blood pressure is low in office today.  We discussed that he can decrease hydrochlorothiazide  to a half a tablet daily.  He does check blood pressures at home and we will have him follow-up for blood pressure recheck in the office as well.  Patient's heart was irregular upon exam today.  We will order EKG.  Denies f/c/s, n/v/d, hemoptysis, PND, leg swelling. Denies chest pain or edema.      No Known Allergies  Immunization History  Administered Date(s) Administered   Influenza,inj,Quad PF,6+ Mos 10/08/2020   PFIZER(Purple Top)SARS-COV-2 Vaccination 03/06/2020, 04/19/2020, 11/13/2020    Tobacco History: Social History   Tobacco Use  Smoking Status Never   Passive exposure: Never  Smokeless Tobacco Never   Counseling given: Not Answered   Outpatient Encounter Medications as of 09/05/2024  Medication Sig   albuterol  (VENTOLIN  HFA) 108 (90 Base) MCG/ACT inhaler Inhale 2 puffs into the lungs every 6 (six) hours as needed for wheezing or shortness of breath.   ascorbic acid (VITAMIN C) 500 MG tablet Take 500 mg by mouth daily.   benzonatate  (TESSALON ) 100 MG capsule Take 1 capsule (100 mg total) by mouth 3 (three) times  daily as needed for cough.   Calcium Carbonate-Vitamin D (CALCIUM 500/D) 500-125 MG-UNIT TABS Take 1 tablet by mouth daily.   cyanocobalamin  1000 MCG tablet Take 2 tablets (2,000 mcg total) by mouth daily.   hydrochlorothiazide  (HYDRODIURIL ) 25 MG tablet TAKE 1/2 TABLET (12.5 MG TOTAL) BY MOUTH DAILY.   lisinopril  (ZESTRIL ) 20 MG tablet Take 1 tablet (20 mg total) by mouth daily.   metoCLOPramide  (REGLAN ) 10 MG tablet Take 1 tablet (10 mg total) by mouth every 6 (six) hours as needed for nausea.   nystatin  cream (MYCOSTATIN ) Apply 1 Application topically 2 (two) times daily.   sodium chloride  (OCEAN) 0.65 % SOLN nasal spray Place 1 spray into both nostrils as needed for congestion.   tadalafil  (CIALIS ) 20 MG tablet Take 1 tablet (20 mg total) by mouth daily as needed.   tamsulosin  (FLOMAX ) 0.4 MG CAPS capsule Take 1 capsule (0.4 mg total) by mouth daily.   traMADol  (ULTRAM ) 50 MG tablet Take 1 - 2 tablets (50 - 100 mg total) by mouth every 6 hours as needed for moderate pain or severe pain.   VIAGRA  25 MG tablet Take 1 tablet (25 mg total) by mouth daily as needed for erectile dysfunction.   [DISCONTINUED] cetirizine  (ZYRTEC ) 10 MG tablet Take 1 tablet (10 mg total) by mouth daily.   [DISCONTINUED] fluticasone  (FLONASE ) 50 MCG/ACT nasal spray Place 2 sprays into both nostrils daily.   cetirizine  (ZYRTEC ) 10 MG tablet Take 1 tablet (10 mg total) by mouth daily.   fluticasone  (FLONASE ) 50 MCG/ACT nasal spray Place 2  sprays into both nostrils daily.   No facility-administered encounter medications on file as of 09/05/2024.    Review of Systems  Review of Systems  Constitutional: Negative.   HENT: Negative.    Cardiovascular: Negative.   Gastrointestinal: Negative.   Allergic/Immunologic: Negative.   Neurological: Negative.   Psychiatric/Behavioral: Negative.       Objective:   BP 90/65   Pulse 98   Temp 98.2 F (36.8 C) (Oral)   Wt 239 lb 9.6 oz (108.7 kg)   SpO2 100%   BMI 34.38  kg/m   Wt Readings from Last 5 Encounters:  09/05/24 239 lb 9.6 oz (108.7 kg)  07/08/24 247 lb (112 kg)  05/12/24 244 lb 6.4 oz (110.9 kg)  03/24/24 243 lb (110.2 kg)  03/18/24 244 lb 6 oz (110.8 kg)     Physical Exam Vitals and nursing note reviewed.  Constitutional:      General: He is not in acute distress.    Appearance: He is well-developed.  Cardiovascular:     Rate and Rhythm: Normal rate and regular rhythm.  Pulmonary:     Effort: Pulmonary effort is normal.     Breath sounds: Normal breath sounds.  Skin:    General: Skin is warm and dry.  Neurological:     Mental Status: He is alert and oriented to person, place, and time.       Assessment & Plan:   Irregular heart rhythm -     EKG 12-Lead  Seasonal allergies -     Cetirizine  HCl; Take 1 tablet (10 mg total) by mouth daily.  Dispense: 30 tablet; Refill: 11  Acute rhinosinusitis -     Fluticasone  Propionate; Place 2 sprays into both nostrils daily.  Dispense: 48 g; Refill: 0     Return in about 4 weeks (around 10/03/2024) for bp recheck - hypotension.   [name], NP 09/05/2024

## 2024-09-06 ENCOUNTER — Ambulatory Visit (HOSPITAL_COMMUNITY)
Admission: RE | Admit: 2024-09-06 | Discharge: 2024-09-06 | Disposition: A | Discharge status: Home / Self Care | Source: Ambulatory Visit

## 2024-09-06 ENCOUNTER — Other Ambulatory Visit: Payer: Self-pay

## 2024-09-06 DIAGNOSIS — C61 Malignant neoplasm of prostate: Secondary | ICD-10-CM | POA: Insufficient documentation

## 2024-09-06 DIAGNOSIS — D72819 Decreased white blood cell count, unspecified: Secondary | ICD-10-CM | POA: Diagnosis not present

## 2024-09-06 DIAGNOSIS — C7951 Secondary malignant neoplasm of bone: Secondary | ICD-10-CM | POA: Insufficient documentation

## 2024-09-06 DIAGNOSIS — D649 Anemia, unspecified: Secondary | ICD-10-CM | POA: Insufficient documentation

## 2024-09-06 DIAGNOSIS — N289 Disorder of kidney and ureter, unspecified: Secondary | ICD-10-CM | POA: Diagnosis not present

## 2024-09-06 DIAGNOSIS — Z192 Hormone resistant malignancy status: Secondary | ICD-10-CM | POA: Diagnosis not present

## 2024-09-06 MED ORDER — SODIUM CHLORIDE 0.9 % IV BOLUS
1000.0000 mL | Freq: Once | INTRAVENOUS | Status: AC
  Administered 2024-09-06: 1000 mL via INTRAVENOUS

## 2024-09-06 MED ORDER — SODIUM CHLORIDE 0.9 % IV BOLUS: 1000.0000 mL | Freq: Once | INTRAVENOUS | Status: DC

## 2024-09-06 MED ORDER — LUTETIUM LU 177 VIPIVOTIDE TET 1000 MBQ/ML IV SOLN
209.6000 | Freq: Once | INTRAVENOUS | Status: AC
Start: 2024-09-06 — End: 2024-09-06
  Administered 2024-09-06: 209.6 via INTRAVENOUS

## 2024-09-06 NOTE — Progress Notes (Signed)
 CLINICAL DATA: [Castrate resistant metastatic prostate adenocarcinoma.  Multifocal bone metastasis.]    EXAM: NUCLEAR MEDICINE PLUVICTO  INJECTION  TECHNIQUE: Infusion: The nuclear medicine technologist and I personally verified the dose activity to be delivered as specified in the written directive, and verified the patient identification via 2 separate methods.  Initial flush of the intravenous catheter was performed was sterile saline. The dose syringe was connected to the catheter and the Lu-177 Pluvicto  administered over a 1 to 10 min infusion. Single 10 cc  lushes with normal saline follow the dose. No complications were noted. The entire IV tubing, venocatheter, stopcock and syringes was removed in total, placed in a disposal bag and sent for assay of the residual activity, which will be reported at a later time in our EMR by the physics staff. Pressure was applied to the venipuncture site, and a compression bandage placed. Patient monitored for 1 hour following infusion.    Radiation Safety personnel were present to perform the discharge survey, as detailed on their documentation. After a short period of observation, the patient had his IV removed.  RADIOPHARMACEUTICALS: [209.6] microcuries Lu-177 PLUVICTO   FINDINGS: Current Infusion: 4]  Planned Infusions: 6    Patient presented to nuclear medicine for treatment. The patient's most recent blood counts were reviewed and remains a good candidate to proceed with Lu-177 Pluvicto .     Patient reports mild fatigue.  Some intermittent LEFT hip pain and back pain.  Overall pain well controlled.       Mild stable renal insufficiency with creatinine equal 1.38.  Patient advised to hydrate following therapy.     Mild improvement in leukopenia.  Stable mild anemia (hemoglobin equal 9.4).     Continued interval improvement PSA.  PSA equal 3.5 decreased from 9.5.       The patient was situated in an infusion suite with a  contact barrier placed under the arm. Intravenous access was established, using sterile technique, and a normal saline infusion from a syringe was started.     Micro-dosimetry: The prescribed radiation activity was assayed and confirmed to be within specified tolerance.  IMPRESSION: Current Infusion: [4]  Planned Infusions: 6    [The patient tolerated the infusion well. The patient will return in 6 weeks for ongoing care

## 2024-09-08 ENCOUNTER — Inpatient Hospital Stay: Attending: Nurse Practitioner | Admitting: Nurse Practitioner

## 2024-09-08 ENCOUNTER — Encounter: Payer: Self-pay | Admitting: Nurse Practitioner

## 2024-09-08 DIAGNOSIS — Z515 Encounter for palliative care: Secondary | ICD-10-CM | POA: Diagnosis not present

## 2024-09-08 DIAGNOSIS — C61 Malignant neoplasm of prostate: Secondary | ICD-10-CM

## 2024-09-08 DIAGNOSIS — R530 Neoplastic (malignant) related fatigue: Secondary | ICD-10-CM

## 2024-09-08 DIAGNOSIS — Z192 Hormone resistant malignancy status: Secondary | ICD-10-CM | POA: Diagnosis not present

## 2024-09-08 NOTE — Progress Notes (Signed)
 Palliative Medicine [facility]  [phone]) [phone] Fax:(336) [phone]   Name: Craig Collins Date: 09/08/2024 MRN: [id number]  DOB: January 15, 1966  Patient Care Team: Oley Bascom RAMAN, NP as PCP - General (Pulmonary Disease) Vertell Pont, RN as Oncology Nurse Navigator Pickenpack-Cousar, Fannie SAILOR, NP as Nurse Practitioner [facility] and Palliative Medicine) Lanny Callander, MD as Consulting Physician (Hematology and Oncology)   I connected with Craig Collins on 09/08/24 at  3:00 PM EDT by telephone and verified that I am speaking with the correct person using two identifiers.   I discussed the limitations, risks, security and privacy concerns of performing an evaluation and management service by telemedicine and the availability of in-person appointments. I also discussed with the patient that there may be a patient responsible charge related to this service. The patient expressed understanding and agreed to proceed.   Other persons participating in the visit and their role in the encounter: N/A   Patient's location: Home   Provider's location: [facility]   INTERVAL HISTORY: Craig Collins is a 58 y.o. 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 Palliative ask to see for symptom and pain management and goals of care.   SOCIAL HISTORY:     reports that he has never smoked. He has never been exposed to tobacco smoke. He has never used smokeless tobacco. He reports that he does not currently use alcohol. He reports that he does not use drugs.  ADVANCE DIRECTIVES:  None on file   CODE STATUS: Full code  PAST MEDICAL HISTORY: Past Medical History:  Diagnosis Date   Benign prostate hyperplasia    Elevated PSA    Erectile dysfunction    Hypertension    Obesity (BMI 35.0-39.9 without comorbidity)    Prostate cancer  (HCC) 05/2019   Sleep apnea    Urinary retention     ALLERGIES:  has no known allergies.  MEDICATIONS:  Current Outpatient Medications  Medication Sig Dispense Refill   albuterol  (VENTOLIN  HFA) 108 (90 Base) MCG/ACT inhaler Inhale 2 puffs into the lungs every 6 (six) hours as needed for wheezing or shortness of breath. 18 g 6   ascorbic acid (VITAMIN C) 500 MG tablet Take 500 mg by mouth daily.     benzonatate  (TESSALON ) 100 MG capsule Take 1 capsule (100 mg total) by mouth 3 (three) times daily as needed for cough. 30 capsule 0   Calcium Carbonate-Vitamin D (CALCIUM 500/D) 500-125 MG-UNIT TABS Take 1 tablet by mouth daily.     cetirizine  (ZYRTEC ) 10 MG tablet Take 1 tablet (10 mg total) by mouth daily. 30 tablet 11   cyanocobalamin  1000 MCG tablet Take 2 tablets (2,000 mcg total) by mouth daily. 30 tablet 0   fluticasone  (FLONASE ) 50 MCG/ACT nasal spray Place 2 sprays into both nostrils daily. 48 g 0   hydrochlorothiazide  (HYDRODIURIL ) 25 MG tablet TAKE 1/2 TABLET (12.5 MG TOTAL) BY MOUTH DAILY. 45 tablet 3   lisinopril  (ZESTRIL ) 20 MG tablet Take 1 tablet (20 mg total) by mouth daily. 90 tablet 3   metoCLOPramide  (REGLAN ) 10 MG tablet Take 1 tablet (10 mg total) by mouth every 6 (six) hours as needed for nausea. 90 tablet 3   nystatin  cream (MYCOSTATIN ) Apply 1 Application topically 2 (two) times daily. 30 g 1   sodium chloride  (OCEAN) 0.65 % SOLN nasal spray Place 1 spray into both nostrils as  needed for congestion. 30 mL 0   tadalafil  (CIALIS ) 20 MG tablet Take 1 tablet (20 mg total) by mouth daily as needed. 10 tablet 1   tamsulosin  (FLOMAX ) 0.4 MG CAPS capsule Take 1 capsule (0.4 mg total) by mouth daily. 30 capsule 3   traMADol  (ULTRAM ) 50 MG tablet Take 1 - 2 tablets (50 - 100 mg total) by mouth every 6 hours as needed for moderate pain or severe pain. 90 tablet 0   VIAGRA  25 MG tablet Take 1 tablet (25 mg total) by mouth daily as needed for erectile dysfunction. 10 tablet 0   No  current facility-administered medications for this visit.   Facility-Administered Medications Ordered in Other Visits  Medication Dose Route Frequency Provider Last Rate Last Admin   sodium chloride  0.9 % bolus 1,000 mL  1,000 mL Intravenous Once Malva PARAS, MD        VITAL SIGNS: There were no vitals taken for this visit. There were no vitals filed for this visit.  Estimated body mass index is 34.38 kg/m as calculated from the following:   Height as of 07/08/24: 5' 10 (1.778 m).   Weight as of 09/05/24: 239 lb 9.6 oz (108.7 kg).   PERFORMANCE STATUS (ECOG) : 1 - Symptomatic but completely ambulatory  IMPRESSION: Discussed the use of AI scribe software for clinical note transcription with the patient, who gave verbal consent to proceed.  History of Present Illness I connected by phone with Craig Collins for symptom management follow-up. No acute distress noted. Denies concerns with nausea, vomiting, or diarrhea. Occasional fatigue. States he is doing well overall after recent diagnosis of COVID (08/27/2024).   He is currently undergoing Pluvicto  treatments.  Tolerating well.  No new or uncontrolled symptoms. All questions answered and support provided. Will continue to support as needed.   Goals of Care 03/24/24: Craig Collins is realistic in his understanding of current cancer state and expectations. He continues to remain hopeful for stability. Goals remain clear to continue to treat the treatable allowing him every opportunity to continue thriving. Relies on his Craig Collins faith.   04/16/23-We discussed Craig Collins current illness and what it means in the larger context of his on-going co-morbidities. Natural disease trajectory and expectations were discussed. We discussed the importance of continued conversation with family and their medical providers regarding overall plan of care and treatment options, ensuring decisions are within the context of the patients values and GOCs.    Craig Collins relates that it has become more difficult for him to leave home, mostly due to the pain and fatigue. There are other factors as well, such as the sweating that he experiences without warning. Patient reports this makes him feel blue. He is hopeful that an improvement in his pain will translate into an improvement in his energy levels.    Craig Collins relies on his faith as a source of strength in coping with his illness. He states that he does not want to claim having cancer and intentionally turns off the television when there is content on cancer or death. He is trying to enjoy each day of his life and not look at cancer as a death sentence. I discussed the importance of continued conversation with family and their medical providers regarding overall plan of care and treatment options, ensuring decisions are within the context of the patients values and GOCs. Assessment & Plan Pain management Pain managed with Tylenol  and tramadol  as needed. Does not require frequent use. Intermittent  pain, less severe. Transitioning to Lyrica  for evaluation. - Continue Tylenol  as needed. - Increase Lyrica  to 50mg  twice daily or 2 capsules at bedtime.  -Tramadol  as needed.   Constipation Mild constipation managed with Miralax. - Continue Miralax as needed.  Follow-up Follow-up with patient in 4-6 weeks in collaboration with oncology visits. Sooner if needed.  Patient expressed understanding and was in agreement with this plan. He also understands that He can call the clinic at any time with any questions, concerns, or complaints.   Any controlled substances utilized were prescribed in the context of palliative care. PDMP has been reviewed.   I provided 20 minutes of non face-to-face telephone visit time during this encounter, and > 50% was spent counseling as documented under my assessment & plan. Visit consisted of counseling and education dealing with the complex and emotionally intense  issues of symptom management and palliative care in the setting of serious and potentially life-threatening illness.  Levon Borer, AGPCNP-BC  Palliative Medicine Team/[facility]

## 2024-09-14 DIAGNOSIS — C7951 Secondary malignant neoplasm of bone: Secondary | ICD-10-CM | POA: Diagnosis not present

## 2024-09-14 DIAGNOSIS — C61 Malignant neoplasm of prostate: Secondary | ICD-10-CM | POA: Diagnosis not present

## 2024-09-26 ENCOUNTER — Other Ambulatory Visit: Payer: Self-pay | Admitting: *Deleted

## 2024-09-26 DIAGNOSIS — C61 Malignant neoplasm of prostate: Secondary | ICD-10-CM

## 2024-09-26 DIAGNOSIS — D649 Anemia, unspecified: Secondary | ICD-10-CM

## 2024-09-26 NOTE — Assessment & Plan Note (Addendum)
 He is getting ADT and will be getting Xgeva at [facility] Continue Vit D 2000 international units and calcium 1200mg  daily Control BP and CV risk management Next dental appointment

## 2024-09-26 NOTE — Assessment & Plan Note (Addendum)
 Completed C4 Pluvicto  Labs in 1 week before each dose Continue ADT at [facility].

## 2024-09-26 NOTE — Assessment & Plan Note (Addendum)
 Will repeat Ferritin, b12 and folate

## 2024-09-26 NOTE — Progress Notes (Unsigned)
 [facility] OFFICE PROGRESS NOTE  Patient Care Team: Oley Bascom RAMAN, NP as PCP - General (Pulmonary Disease) Vertell Pont, RN as Oncology Nurse Navigator Pickenpack-Cousar, Fannie SAILOR, NP as Nurse Practitioner (Hospice and Palliative Medicine) Lanny Callander, MD as Consulting Physician (Hematology and Oncology)  58 y.o. m. With history of HTN, OSA prostate cancer here for follow up. Currently undergoing treatment for mCRPC.    Current diagnosis: mCRPC with bone metastases Initial diagnosis: GS 4+5 GG5 PSA 42 with bone metastases Germline testing: [employer] genetics BRIP1 p. [id number] Variant, Likely Benign Somatic testing: negative for HRRm, neg for dMMR. TMB 1 Muts/Mb Previous treatment:  ADT (at [facility] every 6 months with Dr. Renelda), ARPI (was given apalutamide  but never started, enza 06/2022-08/2023) PSA nadir 12/16/22 at 0.3 08/05/23 - 03/05/24: docetaxel . Completed 11 cycles with progression. Current treatment: 04/14/24 C1 Pluvicto . Pretreatment PSA 55 05/26/24 C2 07/21/24 C3 09/06/24 C4   PSA down to 3.5.  Tolerating well and feeling better without concerning new symptoms. Assessment & Plan Metastatic castration-resistant adenocarcinoma of prostate (HCC) Completed C4 Pluvicto  Labs in 1 week before each dose Continue ADT at [facility]. Next in Jan Stage 3a chronic kidney disease (CKD) (HCC) Creatinine stable relative to baseline Advised fluid goal 64 oz per day Normocytic anemia Will repeat Ferritin, b12 and folate next visit At risk for side effect of medication He is getting ADT and will be getting Marzette (started in Aug 2025) at [facility] Continue Vit D 2000 international units and calcium 1200mg  daily Control BP and CV risk management Next dental appointment   Return on week of 12/9 with labs.  Orders Placed This Encounter  Procedures   CBC with Differential (Cancer Center Only)    Standing Status:   Future    Expiration Date:   09/27/2025   CMP (Cancer Center  only)    Standing Status:   Future    Expiration Date:   09/27/2025   PSA    Standing Status:   Future    Expiration Date:   09/27/2025   Testosterone     Standing Status:   Future    Expiration Date:   09/27/2025   Iron and Iron Binding Capacity ([facility] only)    Standing Status:   Future    Expiration Date:   09/27/2025   Ferritin    Standing Status:   Future    Expiration Date:   09/27/2025   Folate    Standing Status:   Future    Expiration Date:   09/27/2025   Vitamin B12    Standing Status:   Future    Expiration Date:   09/27/2025   Sample to Blood Bank    Standing Status:   Future    Expiration Date:   09/27/2025     [name], MD  INTERVAL HISTORY: Patient returns for follow-up.  Oncology History Overview Note   Cancer Staging  Prostate cancer [facility]) Staging form: Prostate, AJCC 8th Edition - Clinical: Stage IVB (cTX, cNX, pM1b, PSA: 42, Grade Group: 5) - Signed by Amadeo Windell SAILOR, MD on 07/08/2022 Prostate specific antigen (PSA) range: 20 or greater Histologic grading system: 5 grade system     Prostate cancer (HCC) (Resolved)  12/21/2019 Initial Diagnosis   Prostate cancer (HCC)   07/08/2022 Cancer Staging   Staging form: Prostate, AJCC 8th Edition - Clinical: Stage IVB (cTX, cNX, pM1b, PSA: 42, Grade Group: 5) - Signed by Amadeo Windell SAILOR, MD on 07/08/2022 Prostate specific antigen (PSA)  range: 20 or greater Histologic grading system: 5 grade system   Metastatic castration-resistant adenocarcinoma of prostate (HCC)  07/08/2022 Initial Diagnosis   Malignant neoplasm of prostate metastatic to lymph nodes of multiple sites [facility])   08/05/2023 - 03/05/2024 Chemotherapy   Patient is on Treatment Plan : PROSTATE Docetaxel  (75) + Prednisone  q21d     12/28/2023 PET scan   1. Partial response to therapy.  No disease progression. 2. Decreased size and radiotracer activity of RIGHT iliac and retroperitoneal adenopathy. 3. No change in number or pattern of metastatic  skeletal disease; however, significant interval decrease in radiotracer activity and increased sclerosis. Lesions remain intensely radiotracer avid but again decreased in avidity 4. No evidence of visceral metastasis or pulmonary metastasis.   01/20/2024 Cancer Staging   Staging form: Prostate, AJCC 8th Edition - Clinical: Stage IVB (cTX, cN1, pM1b) - Signed by Tina Pauletta BROCKS, MD on 01/20/2024      PHYSICAL EXAMINATION: ECOG PERFORMANCE STATUS: 1 - Symptomatic but completely ambulatory  Vitals:   09/27/24 1046  BP: 116/79  Pulse: 78  Resp: 18  Temp: 97.9 F (36.6 C)  SpO2: 100%   Filed Weights   09/27/24 1046  Weight: 238 lb (108 kg)    GENERAL: alert, no distress and comfortable EYES: sclera clear LYMPH:  no palpable cervical lymphadenopathy  LUNGS: clear to auscultation and no wheeze or rales with normal breathing effort HEART: regular rate & rhythm  ABDOMEN: abdomen soft, non-tender and nondistended. Musculoskeletal: no edema   Relevant data reviewed during this visit included labs.  New labs ordered.

## 2024-09-26 NOTE — Assessment & Plan Note (Addendum)
 Creatinine stable relative to baseline Fluid goal 64 oz per day

## 2024-09-27 ENCOUNTER — Inpatient Hospital Stay (HOSPITAL_BASED_OUTPATIENT_CLINIC_OR_DEPARTMENT_OTHER): Admitting: Nurse Practitioner

## 2024-09-27 ENCOUNTER — Telehealth: Payer: Self-pay

## 2024-09-27 ENCOUNTER — Inpatient Hospital Stay

## 2024-09-27 ENCOUNTER — Inpatient Hospital Stay: Attending: Nurse Practitioner

## 2024-09-27 ENCOUNTER — Encounter: Payer: Self-pay | Admitting: Nurse Practitioner

## 2024-09-27 ENCOUNTER — Inpatient Hospital Stay (HOSPITAL_BASED_OUTPATIENT_CLINIC_OR_DEPARTMENT_OTHER)

## 2024-09-27 ENCOUNTER — Other Ambulatory Visit: Payer: Self-pay | Admitting: *Deleted

## 2024-09-27 VITALS — BP 116/79 | HR 78 | Temp 97.9°F | Resp 18 | Ht 70.0 in | Wt 238.0 lb

## 2024-09-27 DIAGNOSIS — R630 Anorexia: Secondary | ICD-10-CM

## 2024-09-27 DIAGNOSIS — C61 Malignant neoplasm of prostate: Secondary | ICD-10-CM

## 2024-09-27 DIAGNOSIS — D649 Anemia, unspecified: Secondary | ICD-10-CM | POA: Diagnosis not present

## 2024-09-27 DIAGNOSIS — Z515 Encounter for palliative care: Secondary | ICD-10-CM

## 2024-09-27 DIAGNOSIS — G893 Neoplasm related pain (acute) (chronic): Secondary | ICD-10-CM

## 2024-09-27 DIAGNOSIS — N1831 Chronic kidney disease, stage 3a: Secondary | ICD-10-CM

## 2024-09-27 DIAGNOSIS — Z9189 Other specified personal risk factors, not elsewhere classified: Secondary | ICD-10-CM

## 2024-09-27 DIAGNOSIS — C7951 Secondary malignant neoplasm of bone: Secondary | ICD-10-CM | POA: Insufficient documentation

## 2024-09-27 DIAGNOSIS — Z192 Hormone resistant malignancy status: Secondary | ICD-10-CM

## 2024-09-27 DIAGNOSIS — I129 Hypertensive chronic kidney disease with stage 1 through stage 4 chronic kidney disease, or unspecified chronic kidney disease: Secondary | ICD-10-CM | POA: Diagnosis not present

## 2024-09-27 DIAGNOSIS — R530 Neoplastic (malignant) related fatigue: Secondary | ICD-10-CM

## 2024-09-27 LAB — CBC WITH DIFFERENTIAL (CANCER CENTER ONLY)
Abs Immature Granulocytes: 0.01 K/uL (ref 0.00–0.07)
Basophils Absolute: 0 K/uL (ref 0.0–0.1)
Basophils Relative: 0 %
Eosinophils Absolute: 0.1 K/uL (ref 0.0–0.5)
Eosinophils Relative: 3 %
HCT: 29 % — ABNORMAL LOW (ref 39.0–52.0)
Hemoglobin: 9.4 g/dL — ABNORMAL LOW (ref 13.0–17.0)
Immature Granulocytes: 0 %
Lymphocytes Relative: 13 %
Lymphs Abs: 0.4 K/uL — ABNORMAL LOW (ref 0.7–4.0)
MCH: 28.7 pg (ref 26.0–34.0)
MCHC: 32.4 g/dL (ref 30.0–36.0)
MCV: 88.7 fL (ref 80.0–100.0)
Monocytes Absolute: 0.5 K/uL (ref 0.1–1.0)
Monocytes Relative: 17 %
Neutro Abs: 1.8 K/uL (ref 1.7–7.7)
Neutrophils Relative %: 67 %
Platelet Count: 189 K/uL (ref 150–400)
RBC: 3.27 MIL/uL — ABNORMAL LOW (ref 4.22–5.81)
RDW: 14.5 % (ref 11.5–15.5)
WBC Count: 2.8 K/uL — ABNORMAL LOW (ref 4.0–10.5)
nRBC: 0 % (ref 0.0–0.2)

## 2024-09-27 LAB — CMP (CANCER CENTER ONLY)
ALT: 8 U/L (ref 0–44)
AST: 11 U/L — ABNORMAL LOW (ref 15–41)
Albumin: 4.3 g/dL (ref 3.5–5.0)
Alkaline Phosphatase: 79 U/L (ref 38–126)
Anion gap: 6 (ref 5–15)
BUN: 19 mg/dL (ref 6–20)
CO2: 25 mmol/L (ref 22–32)
Calcium: 9 mg/dL (ref 8.9–10.3)
Chloride: 108 mmol/L (ref 98–111)
Creatinine: 0.96 mg/dL (ref 0.61–1.24)
GFR, Estimated: >60 mL/min (ref >60)
Glucose, Bld: 99 mg/dL (ref 70–99)
Potassium: 3.7 mmol/L (ref 3.5–5.1)
Sodium: 139 mmol/L (ref 135–145)
Total Bilirubin: 0.3 mg/dL (ref 0.0–1.2)
Total Protein: 7.8 g/dL (ref 6.5–8.1)

## 2024-09-27 LAB — SAMPLE TO BLOOD BANK

## 2024-09-27 NOTE — Telephone Encounter (Signed)
 Scheduled patient for next appointments. Called and spoke with the patient, he is aware.

## 2024-09-27 NOTE — Progress Notes (Signed)
 Palliative Medicine [facility]  Telephone:(336) [phone] Fax:(336) [phone]   Name: Craig Collins Date: 09/27/2024 MRN: [id number]  DOB: 09-03-1966  Patient Care Team: Oley Bascom RAMAN, NP as PCP - General (Pulmonary Disease) Vertell Pont, RN as Oncology Nurse Navigator Pickenpack-Cousar, Fannie SAILOR, NP as Nurse Practitioner (Hospice and Palliative Medicine) Lanny Callander, MD as Consulting Physician (Hematology and Oncology) Tina Pauletta BROCKS, MD as Consulting Physician (Oncology)    INTERVAL HISTORY: Craig Collins is a 58 y.o. 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 Palliative ask to see for symptom and pain management and goals of care.   SOCIAL HISTORY:     reports that he has never smoked. He has never been exposed to tobacco smoke. He has never used smokeless tobacco. He reports that he does not currently use alcohol. He reports that he does not use drugs.  ADVANCE DIRECTIVES:  None on file   CODE STATUS: Full code  PAST MEDICAL HISTORY: Past Medical History:  Diagnosis Date   Benign prostate hyperplasia    Elevated PSA    Erectile dysfunction    Hypertension    Obesity (BMI 35.0-39.9 without comorbidity)    Prostate cancer (HCC) 05/2019   Sleep apnea    Urinary retention     ALLERGIES:  has no known allergies.  MEDICATIONS:  Current Outpatient Medications  Medication Sig Dispense Refill   albuterol  (VENTOLIN  HFA) 108 (90 Base) MCG/ACT inhaler Inhale 2 puffs into the lungs every 6 (six) hours as needed for wheezing or shortness of breath. 18 g 6   ascorbic acid (VITAMIN C) 500 MG tablet Take 500 mg by mouth daily.     Calcium Carbonate-Vitamin D (CALCIUM 500/D) 500-125 MG-UNIT TABS Take 1 tablet by mouth daily.     cetirizine  (ZYRTEC ) 10 MG tablet Take 1 tablet (10 mg total) by mouth  daily. 30 tablet 11   cyanocobalamin  1000 MCG tablet Take 2 tablets (2,000 mcg total) by mouth daily. 30 tablet 0   fluticasone  (FLONASE ) 50 MCG/ACT nasal spray Place 2 sprays into both nostrils daily. 48 g 0   hydrochlorothiazide  (HYDRODIURIL ) 25 MG tablet TAKE 1/2 TABLET (12.5 MG TOTAL) BY MOUTH DAILY. 45 tablet 3   lisinopril  (ZESTRIL ) 20 MG tablet Take 1 tablet (20 mg total) by mouth daily. 90 tablet 3   metoCLOPramide  (REGLAN ) 10 MG tablet Take 1 tablet (10 mg total) by mouth every 6 (six) hours as needed for nausea. 90 tablet 3   nystatin  cream (MYCOSTATIN ) Apply 1 Application topically 2 (two) times daily. 30 g 1   sodium chloride  (OCEAN) 0.65 % SOLN nasal spray Place 1 spray into both nostrils as needed for congestion. 30 mL 0   tadalafil  (CIALIS ) 20 MG tablet Take 1 tablet (20 mg total) by mouth daily as needed. 10 tablet 1   tamsulosin  (FLOMAX ) 0.4 MG CAPS capsule Take 1 capsule (0.4 mg total) by mouth daily. 30 capsule 3   traMADol  (ULTRAM ) 50 MG tablet Take 1 - 2 tablets (50 - 100 mg total) by mouth every 6 hours as needed for moderate pain or severe pain. 90 tablet 0   VIAGRA  25 MG tablet Take 1 tablet (25 mg total) by mouth daily as needed for erectile dysfunction. 10 tablet 0   No current facility-administered medications for this visit.    VITAL SIGNS: There were no vitals taken for this  visit. There were no vitals filed for this visit.  Estimated body mass index is 34.15 kg/m as calculated from the following:   Height as of an earlier encounter on 09/27/24: 5' 10 (1.778 m).   Weight as of an earlier encounter on 09/27/24: 238 lb (108 kg).   PERFORMANCE STATUS (ECOG) : 1 - Symptomatic but completely ambulatory  IMPRESSION: Discussed the use of AI scribe software for clinical note transcription with the patient, who gave verbal consent to proceed.  History of Present Illness Craig Collins presents to clinic for follow-up. No acute distress noted. Accompanied by his  wife. Occasional fatigue.   He is currently undergoing Pluvicto  treatments.  Tolerating well.  He experiences dry mouth, which affects his appetite and alters the taste of food, making it less appealing. He uses biotin for relief, which helps slightly, and drinks plenty of water while eating to prevent dryness. He also notes that food sometimes feels like it's getting stuck in his mouth due to the dryness.  He experiences occasional constipation and diarrhea. No nausea or vomiting.  He has lower back and hip pain. He takes Tylenol  for pain relief . Is not requiring use of gabapentin  or Tramadol  at this time.   He sleeps well at night and can fall asleep easily at any time, a pattern consistent throughout his life.  No new or uncontrolled symptoms. All questions answered and support provided. Will continue to support as needed.   Goals of Care 03/24/24: Craig Collins is realistic in his understanding of current cancer state and expectations. He continues to remain hopeful for stability. Goals remain clear to continue to treat the treatable allowing him every opportunity to continue thriving. Relies on his [place] faith.   04/16/23-We discussed Craig Collins current illness and what it means in the larger context of his on-going co-morbidities. Natural disease trajectory and expectations were discussed. We discussed the importance of continued conversation with family and their medical providers regarding overall plan of care and treatment options, ensuring decisions are within the context of the patients values and GOCs.   Craig Collins relates that it has become more difficult for him to leave home, mostly due to the pain and fatigue. There are other factors as well, such as the sweating that he experiences without warning. Patient reports this makes him feel blue. He is hopeful that an improvement in his pain will translate into an improvement in his energy levels.    Craig Collins relies on his  faith as a source of strength in coping with his illness. He states that he does not want to claim having cancer and intentionally turns off the television when there is content on cancer or death. He is trying to enjoy each day of his life and not look at cancer as a death sentence. I discussed the importance of continued conversation with family and their medical providers regarding overall plan of care and treatment options, ensuring decisions are within the context of the patients values and GOCs. Assessment & Plan Xerostomia with associated anorexia Xerostomia is causing food to be less appealing and requires drinking fluids while eating to prevent dryness. Biotin is being used with some relief. - Continue biotin for xerostomia - Encourage increased fluid intake during meals  Lower back and hip pain Chronic lower back and hip pain, possibly a side effect of current treatment. Pain is managed with Tylenol . Gabapentin  is discontinued, and tramadol  is not currently being taken. - Continue Tylenol  for pain  management  Constipation and diarrhea Intermittent episodes of constipation and diarrhea reported.  Follow-up Follow-up with patient in 4-6 weeks in collaboration with oncology visits. Sooner if needed.  Patient expressed understanding and was in agreement with this plan. He also understands that He can call the clinic at any time with any questions, concerns, or complaints.   Any controlled substances utilized were prescribed in the context of palliative care. PDMP has been reviewed.   Visit consisted of counseling and education dealing with the complex and emotionally intense issues of symptom management and palliative care in the setting of serious and potentially life-threatening illness.  Levon Borer, AGPCNP-BC  Palliative Medicine Team/[facility]

## 2024-09-28 ENCOUNTER — Inpatient Hospital Stay

## 2024-09-28 LAB — PSA, TOTAL AND FREE
PSA, Free Pct: 21.7 %
PSA, Free: 0.26 ng/mL
Prostate Specific Ag, Serum: 1.2 ng/mL (ref 0.0–4.0)

## 2024-09-29 ENCOUNTER — Other Ambulatory Visit (HOSPITAL_COMMUNITY)

## 2024-10-03 ENCOUNTER — Ambulatory Visit: Payer: Self-pay | Admitting: Nurse Practitioner

## 2024-10-03 ENCOUNTER — Encounter: Payer: Self-pay | Admitting: Nurse Practitioner

## 2024-10-03 VITALS — BP 113/69 | HR 80 | Ht 70.0 in | Wt 237.8 lb

## 2024-10-03 DIAGNOSIS — Z13220 Encounter for screening for lipoid disorders: Secondary | ICD-10-CM | POA: Diagnosis not present

## 2024-10-03 NOTE — Progress Notes (Signed)
 Subjective   Patient ID: Craig Collins, male    DOB: Nov 09, 1966, 58 y.o.   MRN: [id number]  Chief Complaint  Patient presents with   Hypertension    4 week follow up b/p, pt has been checking his b/p at home he said this morning it was 107 on the top. Pt is has checking once a day, he stated his  in same range of normal    Referring provider: Oley Bascom RAMAN, NP  [name] is a 58 y.o. male with Past Medical History: No date: Benign prostate hyperplasia No date: Elevated PSA No date: Erectile dysfunction No date: Hypertension No date: Obesity (BMI 35.0-39.9 without comorbidity) 05/2019: Prostate cancer (HCC) No date: Sleep apnea No date: Urinary retention   HPI  Patient presents today for follow-up visit. He does continue to follow with oncology for prostate cancer. Currently undergoing chemo. Is requesting medications for allergies. He is up-to-date on blood work through oncology.  Denies f/c/s, n/v/d, hemoptysis, PND, leg swelling. Denies chest pain or edema.     No Known Allergies  Immunization History  Administered Date(s) Administered   Influenza,inj,Quad PF,6+ Mos 10/08/2020   PFIZER(Purple Top)SARS-COV-2 Vaccination 03/06/2020, 04/19/2020, 11/13/2020    Tobacco History: Social History   Tobacco Use  Smoking Status Never   Passive exposure: Never  Smokeless Tobacco Never   Counseling given: Not Answered   Outpatient Encounter Medications as of 10/03/2024  Medication Sig   albuterol  (VENTOLIN  HFA) 108 (90 Base) MCG/ACT inhaler Inhale 2 puffs into the lungs every 6 (six) hours as needed for wheezing or shortness of breath.   ascorbic acid (VITAMIN C) 500 MG tablet Take 500 mg by mouth daily.   Calcium Carbonate-Vitamin D (CALCIUM 500/D) 500-125 MG-UNIT TABS Take 1 tablet by mouth daily.   cetirizine  (ZYRTEC ) 10 MG tablet Take 1 tablet (10 mg total) by mouth daily.   cyanocobalamin  1000 MCG tablet Take 2 tablets (2,000 mcg total) by mouth daily.    fluticasone  (FLONASE ) 50 MCG/ACT nasal spray Place 2 sprays into both nostrils daily.   hydrochlorothiazide  (HYDRODIURIL ) 25 MG tablet TAKE 1/2 TABLET (12.5 MG TOTAL) BY MOUTH DAILY.   lisinopril  (ZESTRIL ) 20 MG tablet Take 1 tablet (20 mg total) by mouth daily.   metoCLOPramide  (REGLAN ) 10 MG tablet Take 1 tablet (10 mg total) by mouth every 6 (six) hours as needed for nausea.   nystatin  cream (MYCOSTATIN ) Apply 1 Application topically 2 (two) times daily.   sodium chloride  (OCEAN) 0.65 % SOLN nasal spray Place 1 spray into both nostrils as needed for congestion.   tadalafil  (CIALIS ) 20 MG tablet Take 1 tablet (20 mg total) by mouth daily as needed.   tamsulosin  (FLOMAX ) 0.4 MG CAPS capsule Take 1 capsule (0.4 mg total) by mouth daily.   traMADol  (ULTRAM ) 50 MG tablet Take 1 - 2 tablets (50 - 100 mg total) by mouth every 6 hours as needed for moderate pain or severe pain.   VIAGRA  25 MG tablet Take 1 tablet (25 mg total) by mouth daily as needed for erectile dysfunction.   No facility-administered encounter medications on file as of 10/03/2024.    Review of Systems  Review of Systems  Constitutional: Negative.   HENT: Negative.    Cardiovascular: Negative.   Gastrointestinal: Negative.   Allergic/Immunologic: Negative.   Neurological: Negative.   Psychiatric/Behavioral: Negative.       Objective:   BP 113/69   Pulse 80   Ht 5' 10 (1.778 m)  Wt 237 lb 12.8 oz (107.9 kg)   SpO2 100%   BMI 34.12 kg/m   Wt Readings from Last 5 Encounters:  10/03/24 237 lb 12.8 oz (107.9 kg)  09/27/24 238 lb (108 kg)  09/05/24 239 lb 9.6 oz (108.7 kg)  07/08/24 247 lb (112 kg)  05/12/24 244 lb 6.4 oz (110.9 kg)     Physical Exam Vitals and nursing note reviewed.  Constitutional:      General: He is not in acute distress.    Appearance: He is well-developed.  Cardiovascular:     Rate and Rhythm: Normal rate and regular rhythm.  Pulmonary:     Effort: Pulmonary effort is normal.      Breath sounds: Normal breath sounds.  Skin:    General: Skin is warm and dry.  Neurological:     Mental Status: He is alert and oriented to person, place, and time.       Assessment & Plan:   Lipid screening -     Lipid panel     Return in about 6 months (around 04/03/2025).   [name], NP 10/03/2024

## 2024-10-04 LAB — LIPID PANEL
Chol/HDL Ratio: 4.2 ratio (ref 0.0–5.0)
Cholesterol, Total: 198 mg/dL (ref 100–199)
HDL: 47 mg/dL (ref >39)
LDL Chol Calc (NIH): 134 mg/dL — ABNORMAL HIGH (ref 0–99)
Triglycerides: 94 mg/dL (ref 0–149)
VLDL Cholesterol Cal: 17 mg/dL (ref 5–40)

## 2024-10-06 ENCOUNTER — Ambulatory Visit: Payer: Self-pay | Admitting: Nurse Practitioner

## 2024-10-11 ENCOUNTER — Inpatient Hospital Stay

## 2024-10-13 ENCOUNTER — Inpatient Hospital Stay

## 2024-10-18 ENCOUNTER — Ambulatory Visit (HOSPITAL_COMMUNITY)
Admission: RE | Admit: 2024-10-18 | Discharge: 2024-10-18 | Disposition: A | Discharge status: Home / Self Care | Source: Ambulatory Visit

## 2024-10-18 VITALS — BP 115/80 | HR 78 | Resp 16

## 2024-10-18 DIAGNOSIS — Z192 Hormone resistant malignancy status: Secondary | ICD-10-CM | POA: Insufficient documentation

## 2024-10-18 DIAGNOSIS — C61 Malignant neoplasm of prostate: Secondary | ICD-10-CM | POA: Diagnosis not present

## 2024-10-18 DIAGNOSIS — N400 Enlarged prostate without lower urinary tract symptoms: Secondary | ICD-10-CM | POA: Insufficient documentation

## 2024-10-18 DIAGNOSIS — R5081 Fever presenting with conditions classified elsewhere: Secondary | ICD-10-CM | POA: Diagnosis not present

## 2024-10-18 DIAGNOSIS — D709 Neutropenia, unspecified: Secondary | ICD-10-CM | POA: Diagnosis not present

## 2024-10-18 DIAGNOSIS — R972 Elevated prostate specific antigen [PSA]: Secondary | ICD-10-CM | POA: Diagnosis not present

## 2024-10-18 DIAGNOSIS — D649 Anemia, unspecified: Secondary | ICD-10-CM | POA: Diagnosis not present

## 2024-10-18 LAB — CBC WITH DIFFERENTIAL/PLATELET
Abs Immature Granulocytes: 0 K/uL (ref 0.00–0.07)
Basophils Absolute: 0 K/uL (ref 0.0–0.1)
Basophils Relative: 0 %
Eosinophils Absolute: 0.1 K/uL (ref 0.0–0.5)
Eosinophils Relative: 6 %
HCT: 24.1 % — ABNORMAL LOW (ref 39.0–52.0)
Hemoglobin: 7.5 g/dL — ABNORMAL LOW (ref 13.0–17.0)
Immature Granulocytes: 0 %
Lymphocytes Relative: 19 %
Lymphs Abs: 0.4 K/uL — ABNORMAL LOW (ref 0.7–4.0)
MCH: 29 pg (ref 26.0–34.0)
MCHC: 31.1 g/dL (ref 30.0–36.0)
MCV: 93.1 fL (ref 80.0–100.0)
Monocytes Absolute: 0.3 K/uL (ref 0.1–1.0)
Monocytes Relative: 12 %
Neutro Abs: 1.4 K/uL — ABNORMAL LOW (ref 1.7–7.7)
Neutrophils Relative %: 63 %
Platelets: 142 K/uL — ABNORMAL LOW (ref 150–400)
RBC: 2.59 MIL/uL — ABNORMAL LOW (ref 4.22–5.81)
RDW: 14.7 % (ref 11.5–15.5)
WBC: 2.3 K/uL — ABNORMAL LOW (ref 4.0–10.5)
nRBC: 0 % (ref 0.0–0.2)

## 2024-10-18 MED ORDER — LUTETIUM LU 177 VIPIVOTIDE TET 1000 MBQ/ML IV SOLN
200.0000 | Freq: Once | INTRAVENOUS | Status: AC
  Administered 2024-10-18: 201.9133 via INTRAVENOUS

## 2024-10-18 MED ORDER — SODIUM CHLORIDE 0.9 % IV BOLUS: 1000.0000 mL | Freq: Once | INTRAVENOUS | Status: DC

## 2024-10-18 MED ORDER — SODIUM CHLORIDE 0.9 % IV BOLUS
1000.0000 mL | Freq: Once | INTRAVENOUS | Status: AC
  Administered 2024-10-18: 1000 mL via INTRAVENOUS

## 2024-10-18 NOTE — Progress Notes (Signed)
 EXAM: NUCLEAR MEDICINE PLUVICTO  INJECTION 10/18/2024 04:32:36 PM TECHNIQUE: Patient presented to nuclear medicine for treatment of metastatic prostate cancer. PSMA PET scan was reviewed. The patient's most recent labs were reviewed and it was determined to proceed with Lu-177 Pluvicto . Written informed consent was obtained from the patient. Written instructions for radiation safety were provided. The radiopharmaceutical was injected intravenously according to local protocol. RADIOPHARMACEUTICAL: 201.9133 mCi Lu-177 PLUVICTO  COMPARISON: PSMA PET scan 03/21/2024. CLINICAL HISTORY: Metastatic castration resistant adenocarcinoma of prostate.   FINDINGS: Current Infusion: Treatment number 5. PSA continues to decline with PSA equal to 1.2, decreased from most recent value of 3.5. Stable anemia and neutropenia white blood cell count equal to 2.8. Normal renal function. Planned Infusions: 6 IMPRESSION: Treatment 5 of 6 planned Lu-177 Pluvicto  treatments. PSA continues to decline to 1.2 from 3.5. Stable anemia and neutropenia with white blood cell count of 2.8. Normal renal function. The infusion was well tolerated. Planned return in 6 weeks for the next infusion.

## 2024-10-18 NOTE — Written Directive (Addendum)
  PLUVICTO   THERAPY   RADIOPHARMACEUTICAL: Lutetium 177 vipivotide tetraxetan (Pluvicto )     PRESCRIBED DOSE FOR ADMINISTRATION:  200 mCi   ROUTE OFADMINISTRATION:  IV   DIAGNOSIS:  Metastatic castration resistant adenocarcinoma of prostate, Metastatic castration-resistant adenocarcinoma of prostate    REFERRING PHYSICIAN: Dr. FABIENE Chihuahua   TREATMENT #: 5   ADDITIONAL PHYSICIAN COMMENTS/NOTES: LABS ordered.... Do not need to wait on results  AUTHORIZED USER SIGNATURE & TIME STAMP: Norleen GORMAN Boxer, MD   10/18/24    10:02 AM

## 2024-10-19 DIAGNOSIS — C7951 Secondary malignant neoplasm of bone: Secondary | ICD-10-CM | POA: Diagnosis not present

## 2024-11-02 ENCOUNTER — Other Ambulatory Visit: Payer: Self-pay | Admitting: Nurse Practitioner

## 2024-11-02 ENCOUNTER — Other Ambulatory Visit: Payer: Self-pay

## 2024-11-02 DIAGNOSIS — I10 Essential (primary) hypertension: Secondary | ICD-10-CM

## 2024-11-02 MED ORDER — LISINOPRIL 20 MG PO TABS
20.0000 mg | ORAL_TABLET | Freq: Every day | ORAL | 1 refills | Status: AC
  Filled 2024-11-02 – 2024-11-22 (×2): qty 90, 90d supply, fill #0

## 2024-11-10 ENCOUNTER — Other Ambulatory Visit (HOSPITAL_COMMUNITY)

## 2024-11-10 ENCOUNTER — Other Ambulatory Visit: Payer: Self-pay

## 2024-11-11 ENCOUNTER — Other Ambulatory Visit: Payer: Self-pay

## 2024-11-22 ENCOUNTER — Other Ambulatory Visit: Payer: Self-pay

## 2024-11-23 DIAGNOSIS — C7951 Secondary malignant neoplasm of bone: Secondary | ICD-10-CM | POA: Diagnosis not present

## 2024-11-28 NOTE — Progress Notes (Unsigned)
 [facility] OFFICE PROGRESS NOTE  Patient Care Team: Oley Bascom RAMAN, NP as PCP - General (Pulmonary Disease) Vertell Pont, RN as Oncology Nurse Navigator Pickenpack-Cousar, Fannie SAILOR, NP as Nurse Practitioner (Hospice and Palliative Medicine) Lanny Callander, MD as Consulting Physician (Hematology and Oncology) Tina Pauletta BROCKS, MD as Consulting Physician (Oncology)  Assessment & Plan Metastatic castration-resistant adenocarcinoma of prostate [facility]) Completed C5 Pluvicto  Repeat lab and PET in about 8 weeks Continue ADT at [facility]. Next in Jan  No orders of the defined types were placed in this encounter.    Pauletta BROCKS Tina, MD  INTERVAL HISTORY: Patient returns for follow-up.  Oncology History Overview Note   Cancer Staging  Prostate cancer [facility]) Staging form: Prostate, AJCC 8th Edition - Clinical: Stage IVB (cTX, cNX, pM1b, PSA: 42, Grade Group: 5) - Signed by Amadeo Windell SAILOR, MD on 07/08/2022 Prostate specific antigen (PSA) range: 20 or greater Histologic grading system: 5 grade system     Prostate cancer (HCC) (Resolved)  12/21/2019 Initial Diagnosis   Prostate cancer (HCC)   07/08/2022 Cancer Staging   Staging form: Prostate, AJCC 8th Edition - Clinical: Stage IVB (cTX, cNX, pM1b, PSA: 42, Grade Group: 5) - Signed by Amadeo Windell SAILOR, MD on 07/08/2022 Prostate specific antigen (PSA) range: 20 or greater Histologic grading system: 5 grade system   Metastatic castration-resistant adenocarcinoma of prostate (HCC)  07/08/2022 Initial Diagnosis   Malignant neoplasm of prostate metastatic to lymph nodes of multiple sites [facility])   08/05/2023 - 03/05/2024 Chemotherapy   Patient is on Treatment Plan : PROSTATE Docetaxel  (75) + Prednisone  q21d     12/28/2023 PET scan   1. Partial response to therapy.  No disease progression. 2. Decreased size and radiotracer activity of RIGHT iliac and retroperitoneal adenopathy. 3. No change in number or pattern of metastatic skeletal disease;  however, significant interval decrease in radiotracer activity and increased sclerosis. Lesions remain intensely radiotracer avid but again decreased in avidity 4. No evidence of visceral metastasis or pulmonary metastasis.   01/20/2024 Cancer Staging   Staging form: Prostate, AJCC 8th Edition - Clinical: Stage IVB (cTX, cN1, pM1b) - Signed by Tina Pauletta BROCKS, MD on 01/20/2024      PHYSICAL EXAMINATION: ECOG PERFORMANCE STATUS: {CHL ONC ECOG ED:[id number]}  There were no vitals filed for this visit. There were no vitals filed for this visit.  GENERAL: alert, no distress and comfortable SKIN: skin color normal and no jaundice or bruising or petechiae on exposed skin EYES: normal, sclera clear OROPHARYNX: no exudate  NECK: No palpable mass LYMPH:  no palpable cervical, axillary lymphadenopathy  LUNGS: clear to auscultation and no wheeze or rales with normal breathing effort HEART: regular rate & rhythm  ABDOMEN: abdomen soft, non-tender and nondistended. Musculoskeletal: no edema NEURO: no focal motor/sensory deficits  Relevant data reviewed during this visit included labs.  New labs ordered.

## 2024-11-28 NOTE — Assessment & Plan Note (Addendum)
 Completed C5 Pluvicto  Repeat lab and PET on 2/16 and see me on 2/23 Continue ADT every 6 months at [facility]. Next in Jan

## 2024-11-29 ENCOUNTER — Inpatient Hospital Stay (HOSPITAL_BASED_OUTPATIENT_CLINIC_OR_DEPARTMENT_OTHER): Admitting: Nurse Practitioner

## 2024-11-29 ENCOUNTER — Ambulatory Visit: Payer: Self-pay

## 2024-11-29 ENCOUNTER — Inpatient Hospital Stay

## 2024-11-29 ENCOUNTER — Encounter: Payer: Self-pay | Admitting: Nurse Practitioner

## 2024-11-29 ENCOUNTER — Inpatient Hospital Stay: Attending: Nurse Practitioner

## 2024-11-29 ENCOUNTER — Encounter: Payer: Self-pay | Admitting: Neurology

## 2024-11-29 VITALS — BP 106/64 | HR 87 | Temp 97.5°F | Resp 20 | Wt 236.4 lb

## 2024-11-29 DIAGNOSIS — G629 Polyneuropathy, unspecified: Secondary | ICD-10-CM

## 2024-11-29 DIAGNOSIS — C778 Secondary and unspecified malignant neoplasm of lymph nodes of multiple regions: Secondary | ICD-10-CM | POA: Insufficient documentation

## 2024-11-29 DIAGNOSIS — G893 Neoplasm related pain (acute) (chronic): Secondary | ICD-10-CM | POA: Diagnosis not present

## 2024-11-29 DIAGNOSIS — D61818 Other pancytopenia: Secondary | ICD-10-CM

## 2024-11-29 DIAGNOSIS — Z9189 Other specified personal risk factors, not elsewhere classified: Secondary | ICD-10-CM

## 2024-11-29 DIAGNOSIS — Z192 Hormone resistant malignancy status: Secondary | ICD-10-CM | POA: Diagnosis not present

## 2024-11-29 DIAGNOSIS — D649 Anemia, unspecified: Secondary | ICD-10-CM

## 2024-11-29 DIAGNOSIS — R197 Diarrhea, unspecified: Secondary | ICD-10-CM | POA: Diagnosis not present

## 2024-11-29 DIAGNOSIS — Z515 Encounter for palliative care: Secondary | ICD-10-CM | POA: Diagnosis not present

## 2024-11-29 DIAGNOSIS — C61 Malignant neoplasm of prostate: Secondary | ICD-10-CM | POA: Diagnosis not present

## 2024-11-29 DIAGNOSIS — Y842 Radiological procedure and radiotherapy as the cause of abnormal reaction of the patient, or of later complication, without mention of misadventure at the time of the procedure: Secondary | ICD-10-CM

## 2024-11-29 DIAGNOSIS — K117 Disturbances of salivary secretion: Secondary | ICD-10-CM

## 2024-11-29 DIAGNOSIS — Z95828 Presence of other vascular implants and grafts: Secondary | ICD-10-CM

## 2024-11-29 DIAGNOSIS — K5900 Constipation, unspecified: Secondary | ICD-10-CM

## 2024-11-29 LAB — CBC WITH DIFFERENTIAL (CANCER CENTER ONLY)
Abs Immature Granulocytes: 0.01 K/uL (ref 0.00–0.07)
Basophils Absolute: 0 K/uL (ref 0.0–0.1)
Basophils Relative: 0 %
Eosinophils Absolute: 0.2 K/uL (ref 0.0–0.5)
Eosinophils Relative: 6 %
HCT: 27.2 % — ABNORMAL LOW (ref 39.0–52.0)
Hemoglobin: 8.7 g/dL — ABNORMAL LOW (ref 13.0–17.0)
Immature Granulocytes: 0 %
Lymphocytes Relative: 18 %
Lymphs Abs: 0.4 K/uL — ABNORMAL LOW (ref 0.7–4.0)
MCH: 28.7 pg (ref 26.0–34.0)
MCHC: 32 g/dL (ref 30.0–36.0)
MCV: 89.8 fL (ref 80.0–100.0)
Monocytes Absolute: 0.3 K/uL (ref 0.1–1.0)
Monocytes Relative: 12 %
Neutro Abs: 1.6 K/uL — ABNORMAL LOW (ref 1.7–7.7)
Neutrophils Relative %: 64 %
Platelet Count: 134 K/uL — ABNORMAL LOW (ref 150–400)
RBC: 3.03 MIL/uL — ABNORMAL LOW (ref 4.22–5.81)
RDW: 14.1 % (ref 11.5–15.5)
WBC Count: 2.5 K/uL — ABNORMAL LOW (ref 4.0–10.5)
nRBC: 0 % (ref 0.0–0.2)

## 2024-11-29 LAB — CMP (CANCER CENTER ONLY)
ALT: 8 U/L (ref 0–44)
AST: 12 U/L — ABNORMAL LOW (ref 15–41)
Albumin: 4.1 g/dL (ref 3.5–5.0)
Alkaline Phosphatase: 76 U/L (ref 38–126)
Anion gap: 10 (ref 5–15)
BUN: 24 mg/dL — ABNORMAL HIGH (ref 6–20)
CO2: 23 mmol/L (ref 22–32)
Calcium: 9.2 mg/dL (ref 8.9–10.3)
Chloride: 107 mmol/L (ref 98–111)
Creatinine: 1.2 mg/dL (ref 0.61–1.24)
GFR, Estimated: >60 mL/min (ref >60)
Glucose, Bld: 102 mg/dL — ABNORMAL HIGH (ref 70–99)
Potassium: 4 mmol/L (ref 3.5–5.1)
Sodium: 140 mmol/L (ref 135–145)
Total Bilirubin: 0.2 mg/dL (ref 0.0–1.2)
Total Protein: 7 g/dL (ref 6.5–8.1)

## 2024-11-29 LAB — VITAMIN B12: Vitamin B-12: 1046 pg/mL — ABNORMAL HIGH (ref 180–914)

## 2024-11-29 LAB — SAMPLE TO BLOOD BANK

## 2024-11-29 LAB — FERRITIN: Ferritin: 202 ng/mL (ref 24–336)

## 2024-11-29 LAB — IRON AND IRON BINDING CAPACITY (CC-WL,HP ONLY)
Iron: 77 ug/dL (ref 45–182)
Saturation Ratios: 27 % (ref 17.9–39.5)
TIBC: 287 ug/dL (ref 250–450)
UIBC: 211 ug/dL

## 2024-11-29 LAB — FOLATE: Folate: 4.3 ng/mL — ABNORMAL LOW (ref >5.9)

## 2024-11-29 LAB — PSA: Prostatic Specific Antigen: 0.51 ng/mL (ref 0.00–4.00)

## 2024-11-29 NOTE — Assessment & Plan Note (Addendum)
 He is getting ADT and Marzette (started in Aug 2025) at [facility] Continue Vit D 2000 international units and calcium 1200mg  daily Control BP and CV risk management Next dental appointment

## 2024-11-29 NOTE — Assessment & Plan Note (Addendum)
 Likely secondary to history of chemotherapy and Pluvicto . Will repeat Ferritin, b12 and folate

## 2024-11-29 NOTE — Assessment & Plan Note (Addendum)
 Lyrica  causing heartburn. Gabapentin  not working. Will hold off further medication Refer to neurology

## 2024-11-29 NOTE — Progress Notes (Signed)
 Palliative Medicine [facility]  [phone] [phone]) [phone]   Name: Craig Collins Date: 11/29/2024 MRN: [id number]  DOB: 30-Dec-1965  Patient Care Team: Oley Bascom RAMAN, NP as PCP - General (Pulmonary Disease) Vertell Pont, RN as Oncology Nurse Navigator Pickenpack-Cousar, Fannie SAILOR, NP as Nurse Practitioner (Hospice and Palliative Medicine) Lanny Callander, MD as Consulting Physician (Hematology and Oncology) Tina Pauletta BROCKS, MD as Consulting Physician (Oncology)    INTERVAL HISTORY: Craig Collins is a 57 y.o. 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 Palliative ask to see for symptom and pain management and goals of care.   SOCIAL HISTORY:     reports that he has never smoked. He has never been exposed to tobacco smoke. He has never used smokeless tobacco. He reports that he does not currently use alcohol. He reports that he does not use drugs.  ADVANCE DIRECTIVES:  None on file   CODE STATUS: Full code  PAST MEDICAL HISTORY: Past Medical History:  Diagnosis Date   Benign prostate hyperplasia    Elevated PSA    Erectile dysfunction    Hypertension    Obesity (BMI 35.0-39.9 without comorbidity)    Prostate cancer (HCC) 05/2019   Sleep apnea    Urinary retention     ALLERGIES:  has no known allergies.  MEDICATIONS:  Current Outpatient Medications  Medication Sig Dispense Refill   albuterol  (VENTOLIN  HFA) 108 (90 Base) MCG/ACT inhaler Inhale 2 puffs into the lungs every 6 (six) hours as needed for wheezing or shortness of breath. 18 g 6   ascorbic acid (VITAMIN C) 500 MG tablet Take 500 mg by mouth daily.     Calcium Carbonate-Vitamin D  (CALCIUM 500/D) 500-125 MG-UNIT TABS Take 1 tablet by mouth daily.     cetirizine  (ZYRTEC ) 10 MG tablet Take 1 tablet (10 mg total) by mouth  daily. 30 tablet 11   cyanocobalamin  1000 MCG tablet Take 2 tablets (2,000 mcg total) by mouth daily. 30 tablet 0   cyclobenzaprine  (FLEXERIL ) 10 MG tablet 3 (three) times daily as needed for muscle spasms (One PRN).     fluticasone  (FLONASE ) 50 MCG/ACT nasal spray Place 2 sprays into both nostrils daily. 48 g 0   hydrochlorothiazide  (HYDRODIURIL ) 25 MG tablet TAKE 1/2 TABLET (12.5 MG TOTAL) BY MOUTH DAILY. 45 tablet 3   lisinopril  (ZESTRIL ) 20 MG tablet Take 1 tablet (20 mg total) by mouth daily. 90 tablet 1   metoCLOPramide  (REGLAN ) 10 MG tablet Take 1 tablet (10 mg total) by mouth every 6 (six) hours as needed for nausea. 90 tablet 3   NITROGLYCERIN  SL NITROGLYCERIN  SL     nystatin  cream (MYCOSTATIN ) Apply 1 Application topically 2 (two) times daily. 30 g 1   pantoprazole  (PROTONIX ) 40 MG tablet Take 40 mg by mouth daily. Take PRN     sodium chloride  (OCEAN) 0.65 % SOLN nasal spray Place 1 spray into both nostrils as needed for congestion. 30 mL 0   tadalafil  (CIALIS ) 20 MG tablet Take 1 tablet (20 mg total) by mouth daily as needed. 10 tablet 1   tamsulosin  (FLOMAX ) 0.4 MG CAPS capsule Take 1 capsule (0.4 mg total) by mouth daily. 30 capsule 3   traMADol  (ULTRAM ) 50 MG tablet Take 1 - 2 tablets (50 - 100 mg total) by mouth every 6 hours as needed for moderate pain or severe pain. 90 tablet  0   VIAGRA  25 MG tablet Take 1 tablet (25 mg total) by mouth daily as needed for erectile dysfunction. 10 tablet 0   No current facility-administered medications for this visit.    VITAL SIGNS: There were no vitals taken for this visit. There were no vitals filed for this visit.  Estimated body mass index is 33.92 kg/m as calculated from the following:   Height as of 10/03/24: 5' 10 (1.778 m).   Weight as of an earlier encounter on 11/29/24: 236 lb 6.4 oz (107.2 kg).   PERFORMANCE STATUS (ECOG) : 1 - Symptomatic but completely ambulatory  IMPRESSION: Discussed the use of AI scribe software for  clinical note transcription with the patient, who gave verbal consent to proceed.  History of Present Illness Mr. Craig Collins presents to clinic for follow-up. No acute distress noted. Accompanied by his wife. Occasional fatigue. Doing well overall.   He is currently undergoing Pluvicto  treatments.  Tolerating well. He is currently has one session remaining. Despite the challenges with dry mouth, he feels 'blessed' overall.  He experiences significant dry mouth, describing it as 'real hard' to manage. This symptom has been persistent and affects his ability to eat, as food tends to stick to his teeth and tongue, leading to a loss of taste and decreased appetite. Recently, his appetite has improved, stating 'it's back' and 'came back at Thanksgiving'.  He uses Biotene products, including gum and spray, to alleviate the dry mouth, but finds them only partially effective. Recommended use of lemon candy to help manage the symptoms. He emphasizes the need to drink fluids frequently, especially when eating out, to prevent food from sticking and to maintain his sense of taste.  He has lower back and hip pain. He takes Tylenol  for pain relief . Is not requiring use of gabapentin  or Tramadol  at this time. He has upcoming appointment with Neurologist for his neuropathic symptoms.   No new or uncontrolled symptoms. All questions answered and support provided. Will continue to support as needed.    Goals of Care 03/24/24: Mr. Craig Collins is realistic in his understanding of current cancer state and expectations. He continues to remain hopeful for stability. Goals remain clear to continue to treat the treatable allowing him every opportunity to continue thriving. Relies on his [place] faith.   04/16/23-We discussed Mr. Craig Collins current illness and what it means in the larger context of his on-going co-morbidities. Natural disease trajectory and expectations were discussed. We discussed the importance of  continued conversation with family and their medical providers regarding overall plan of care and treatment options, ensuring decisions are within the context of the patients values and GOCs.   Mr. Craig Collins relates that it has become more difficult for him to leave home, mostly due to the pain and fatigue. There are other factors as well, such as the sweating that he experiences without warning. Patient reports this makes him feel blue. He is hopeful that an improvement in his pain will translate into an improvement in his energy levels.    Mr. Craig Collins relies on his faith as a source of strength in coping with his illness. He states that he does not want to claim having cancer and intentionally turns off the television when there is content on cancer or death. He is trying to enjoy each day of his life and not look at cancer as a death sentence. I discussed the importance of continued conversation with family and their medical providers regarding overall plan of  care and treatment options, ensuring decisions are within the context of the patients values and GOCs. Assessment & Plan Lower back and hip pain Chronic lower back and hip pain, possibly a side effect of current treatment. Pain is managed with Tylenol . Gabapentin  is discontinued, and tramadol  is not currently being taken. - Continue Tylenol  for pain management  Constipation and diarrhea Intermittent episodes of constipation and diarrhea reported.  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 Current management with Biotene products provides inconsistent relief. - Continue using Biotene products for xerostomia management. - Consider using lemon candy to stimulate saliva production. - Encourage increased fluid intake to prevent food sticking to teeth and tongue.  Malignant neoplasm under active treatment Undergoing active treatment with one session  remaining. Appetite has returned, indicating a positive response to treatment. - Continue with the remaining treatment session.  Follow-up Follow-up with patient in 4-6 weeks in collaboration with oncology visits. Sooner if needed.  Patient expressed understanding and was in agreement with this plan. He also understands that He can call the clinic at any time with any questions, concerns, or complaints.   Any controlled substances utilized were prescribed in the context of palliative care. PDMP has been reviewed.   Visit consisted of counseling and education dealing with the complex and emotionally intense issues of symptom management and palliative care in the setting of serious and potentially life-threatening illness.  Levon Borer, AGPCNP-BC  Palliative Medicine Team/[facility]

## 2024-11-30 ENCOUNTER — Other Ambulatory Visit: Payer: Self-pay

## 2024-11-30 ENCOUNTER — Encounter: Payer: Self-pay | Admitting: Hematology

## 2024-11-30 DIAGNOSIS — D649 Anemia, unspecified: Secondary | ICD-10-CM

## 2024-11-30 LAB — TESTOSTERONE: Testosterone: 17 ng/dL — ABNORMAL LOW (ref 264–916)

## 2024-11-30 MED ORDER — FOLIC ACID 1 MG PO TABS: 1.0000 mg | ORAL_TABLET | Freq: Every day | ORAL | 2 refills | Status: DC

## 2024-11-30 MED ORDER — FOLIC ACID 1 MG PO TABS
1.0000 mg | ORAL_TABLET | Freq: Every day | ORAL | 2 refills | Status: AC
  Filled 2024-11-30: qty 30, 30d supply, fill #0
  Filled 2024-12-27: qty 30, 30d supply, fill #1

## 2024-12-02 NOTE — Written Directive (Addendum)
°  PLUVICTO   THERAPY   RADIOPHARMACEUTICAL: Lutetium 177 vipivotide tetraxetan (Pluvicto )     PRESCRIBED DOSE FOR ADMINISTRATION:  200 mCi   ROUTE OFADMINISTRATION:  IV   DIAGNOSIS:   Metastatic castration resistant adenocarcinoma of prostate  Dx: Metastatic castration-resistant adenocarcinoma of prostate (HCC) [C61, Z19.2 (ICD-10-CM)]    REFERRING PHYSICIAN: Tina Pauletta BROCKS, MD   TREATMENT #: 6   ADDITIONAL PHYSICIAN COMMENTS/NOTES: NO LABS TODAY  AUTHORIZED USER SIGNATURE & TIME STAMP: Norleen GORMAN Boxer, MD   12/06/2024    12:42 PM

## 2024-12-06 ENCOUNTER — Inpatient Hospital Stay (HOSPITAL_COMMUNITY): Admission: RE | Admit: 2024-12-06 | Discharge: 2024-12-06 | Disposition: A | Source: Ambulatory Visit

## 2024-12-06 DIAGNOSIS — C61 Malignant neoplasm of prostate: Secondary | ICD-10-CM | POA: Diagnosis present

## 2024-12-06 DIAGNOSIS — Z192 Hormone resistant malignancy status: Secondary | ICD-10-CM | POA: Insufficient documentation

## 2024-12-06 MED ORDER — LUTETIUM LU 177 VIPIVOTIDE TET 1000 MBQ/ML IV SOLN
195.9950 | Freq: Once | INTRAVENOUS | Status: AC
  Administered 2024-12-06: 15:00:00 195.995 via INTRAVENOUS

## 2024-12-06 MED ORDER — SODIUM CHLORIDE 0.9 % IV BOLUS: 1000.0000 mL | Freq: Once | INTRAVENOUS | Status: DC

## 2024-12-06 MED ORDER — HEPARIN SOD (PORK) LOCK FLUSH 100 UNIT/ML IV SOLN
500.0000 [IU] | Freq: Once | INTRAVENOUS | Status: AC
  Administered 2024-12-06: 16:00:00 500 [IU] via INTRAVENOUS
  Filled 2024-12-06: qty 5

## 2024-12-06 NOTE — Progress Notes (Signed)
 EXAM: NUCLEAR MEDICINE PLUVICTO  INJECTION 12/06/2024 04:01:17 PM TECHNIQUE: Patient presented to nuclear medicine for treatment of metastatic prostate cancer. PSMA PET scan was reviewed. The patient's most recent labs were reviewed and it was determined to proceed with Lu-177 Pluvicto . Written informed consent was obtained from the patient. Written instructions for radiation safety were provided. The radiopharmaceutical was injected intravenously according to local protocol. 195.995 mCi Lu-177 Pluvicto  was given via right antecubital intravenous (IV) access at 15:22 over 2 minutes. Initial assay was 196 mCi. RADIOPHARMACEUTICAL: 195.995 mCi Lu-177 PLUVICTO  COMPARISON: None available. CLINICAL HISTORY: Metastatic castration resistant adenocarcinoma of prostate. FINDINGS: Current Infusion: This is the 6th and final treatment. Patient reports no adverse clinical effects. Patient is feeling very well. No complaints of pain. Mild decreased blood counts with hemoglobin equal to 8.7, white blood cells equal to 2.5, and platelets equal to 134. Findings stable from comparison exam. Prostate-specific antigen (PSA) continues to decrease to PSA equal to 0.51 on 11/29/2024. Planned Infusions: 6 IMPRESSION: 6th and final treatment of Lu-177 Pluvicto  for metastatic castration resistant adenocarcinoma of the prostate. Patient reports no adverse clinical effects and is feeling very well. Mild decreased blood counts, stable from the comparison exam. PSA continues to decrease, now 0.51 on 11/29/2024.

## 2024-12-27 ENCOUNTER — Encounter: Payer: Self-pay | Admitting: Hematology

## 2024-12-27 ENCOUNTER — Other Ambulatory Visit: Payer: Self-pay

## 2024-12-27 NOTE — Progress Notes (Unsigned)
 "  Initial neurology clinic note  Reason for Evaluation: Consultation requested by Tina Pauletta BROCKS, MD for an opinion regarding neuropathy. My final recommendations will be communicated back to the requesting physician by way of shared medical record or letter to requesting physician via [place]  mail.  HPI: This is Mr. Craig Collins, a 59 y.o. ***-handed male with a medical history of HTN, OSA, prostate cancer*** who presents to neurology clinic with the chief complaint of ***. The patient is accompanied by ***.  *** Neuropathy per oncology note Gabapentin  did not help Lyrica  caused heart burn Referred to neurology  On B12, folic acid , vit D, calcium, and vit C  Cancer history per Dr. Tina (oncology) note from 11/29/24: Current diagnosis: mCRPC with bone metastases Initial diagnosis: GS 4+5 GG5 PSA 42 with bone metastases Germline testing: [employer] genetics BRIP1 p. [id number] Variant, Likely Benign Somatic testing: negative for HRRm, neg for dMMR. TMB 1 Muts/Mb Previous treatment:  ADT (at [facility] every 6 months with Dr. Renelda), ARPI (was given apalutamide  but never started, enza 06/2022-08/2023) PSA nadir 12/16/22 at 0.3 08/05/23 - 03/05/24: docetaxel . Completed 11 cycles with progression. Current treatment: 04/14/24 C1 Pluvicto . Pretreatment PSA 55 05/26/24 C2 07/21/24 C3 09/06/24 C4 10/18/2024 cycle 5  The patient has not*** had similar episodes of symptoms in the past. ***  Muscle bulk loss? *** Muscle pain? ***  Cramps/Twitching? *** Suggestion of myotonia/difficulty relaxing after contraction? ***  Fatigable weakness?*** Does strength improve after brief exercise?***  Able to brush hair/teeth without difficulty? *** Able to button shirts/use zips? *** Clumsiness/dropping grasped objects?*** Can you arise from squatted position easily? *** Able to get out of chair without using arms? *** Able to walk up steps easily? *** Use an assistive device to walk? *** Significant  imbalance with walking? *** Falls?*** Any change in urine color, especially after exertion/physical activity? ***  The patient denies*** symptoms suggestive of oculobulbar weakness including diplopia, ptosis, dysphagia, poor saliva control, dysarthria/dysphonia, impaired mastication, facial weakness/droop.  There are no*** neuromuscular respiratory weakness symptoms, particularly orthopnea>dyspnea.   Pseudobulbar affect is absent***.  The patient does not*** report symptoms referable to autonomic dysfunction including impaired sweating, heat or cold intolerance, excessive mucosal dryness, gastroparetic early satiety, postprandial abdominal bloating, constipation, bowel or bladder dyscontrol, erectile dysfunction*** or syncope/presyncope/orthostatic intolerance.  There are no*** complaints relating to other symptoms of small fiber modalities including paresthesia/pain.  The patient has not *** noticed any recent skin rashes nor does he*** report any constitutional symptoms like fever, night sweats, anorexia or unintentional weight loss.  EtOH use: ***  Restrictive diet? *** Family history of neuropathy/myopathy/NM disease?***  Previous labs, electrodiagnostics, and neuroimaging are summarized below, but pertinent findings include***  Any biopsy done? *** Current medications being tried for the patient's symptoms include ***  Prior medications that have been tried: ***   MEDICATIONS:  Outpatient Encounter Medications as of 01/06/2025  Medication Sig Note   albuterol  (VENTOLIN  HFA) 108 (90 Base) MCG/ACT inhaler Inhale 2 puffs into the lungs every 6 (six) hours as needed for wheezing or shortness of breath. 03/05/2023: Prn    ascorbic acid (VITAMIN C) 500 MG tablet Take 500 mg by mouth daily.    Calcium Carbonate-Vitamin D  (CALCIUM 500/D) 500-125 MG-UNIT TABS Take 1 tablet by mouth daily.    cetirizine  (ZYRTEC ) 10 MG tablet Take 1 tablet (10 mg total) by mouth daily.    cyanocobalamin  1000  MCG tablet Take 2 tablets (2,000 mcg total) by mouth daily.  cyclobenzaprine  (FLEXERIL ) 10 MG tablet 3 (three) times daily as needed for muscle spasms (One PRN).    fluticasone  (FLONASE ) 50 MCG/ACT nasal spray Place 2 sprays into both nostrils daily.    folic acid  (FOLVITE ) 1 MG tablet Take 1 tablet (1 mg total) by mouth daily.    hydrochlorothiazide  (HYDRODIURIL ) 25 MG tablet TAKE 1/2 TABLET (12.5 MG TOTAL) BY MOUTH DAILY.    lisinopril  (ZESTRIL ) 20 MG tablet Take 1 tablet (20 mg total) by mouth daily.    metoCLOPramide  (REGLAN ) 10 MG tablet Take 1 tablet (10 mg total) by mouth every 6 (six) hours as needed for nausea.    NITROGLYCERIN  SL NITROGLYCERIN  SL    nystatin  cream (MYCOSTATIN ) Apply 1 Application topically 2 (two) times daily.    pantoprazole  (PROTONIX ) 40 MG tablet Take 40 mg by mouth daily. Take PRN    sodium chloride  (OCEAN) 0.65 % SOLN nasal spray Place 1 spray into both nostrils as needed for congestion.    tadalafil  (CIALIS ) 20 MG tablet Take 1 tablet (20 mg total) by mouth daily as needed.    tamsulosin  (FLOMAX ) 0.4 MG CAPS capsule Take 1 capsule (0.4 mg total) by mouth daily.    traMADol  (ULTRAM ) 50 MG tablet Take 1 - 2 tablets (50 - 100 mg total) by mouth every 6 hours as needed for moderate pain or severe pain.    VIAGRA  25 MG tablet Take 1 tablet (25 mg total) by mouth daily as needed for erectile dysfunction.    No facility-administered encounter medications on file as of 01/06/2025.    PAST MEDICAL HISTORY: Past Medical History:  Diagnosis Date   Benign prostate hyperplasia    Elevated PSA    Erectile dysfunction    Hypertension    Obesity (BMI 35.0-39.9 without comorbidity)    Prostate cancer (HCC) 05/2019   Sleep apnea    Urinary retention     PAST SURGICAL HISTORY: Past Surgical History:  Procedure Laterality Date   COLONOSCOPY N/A 10/14/2017   Procedure: COLONOSCOPY;  Surgeon: Avram Lupita BRAVO, MD;  Location: [facility] ENDOSCOPY;  Service: Endoscopy;   Laterality: N/A;   ESOPHAGOGASTRODUODENOSCOPY (EGD) WITH PROPOFOL  N/A 08/04/2022   Procedure: ESOPHAGOGASTRODUODENOSCOPY (EGD) WITH PROPOFOL ;  Surgeon: Kristie Lamprey, MD;  Location: [facility] ENDOSCOPY;  Service: Gastroenterology;  Laterality: N/A;   IR IMAGING GUIDED PORT INSERTION  08/06/2023    ALLERGIES: Allergies[1]  FAMILY HISTORY: Family History  Problem Relation Age of Onset   Leukemia Mother 85 - 43   Hypertension Brother    Prostate cancer Maternal Uncle    Prostate cancer Maternal Grandfather    Hypertension Other    Prostate cancer Cousin        maternal first cousin   Colon cancer Neg Hx     SOCIAL HISTORY: Social History[2] Social History   Social History Narrative   Not on file     OBJECTIVE: PHYSICAL EXAM: There were no vitals taken for this visit.  General:*** General appearance: Awake and alert. No distress. Cooperative with exam.  Skin: No obvious rash or jaundice. HEENT: Atraumatic. Anicteric. Lungs: Non-labored breathing on room air  Heart: Regular Abdomen: Soft, non tender. Extremities: No edema. No obvious deformity.  Musculoskeletal: No obvious joint swelling. Psych: Affect appropriate.  Neurological: Mental Status: Alert. Speech fluent. No pseudobulbar affect Cranial Nerves: CNII: No RAPD. Visual fields grossly intact. CNIII, IV, VI: PERRL. No nystagmus. EOMI. CN V: Facial sensation intact bilaterally to fine touch. [place] clench strong. Jaw jerk***. CN VII: Facial muscles symmetric and  strong. No ptosis at rest or after sustained upgaze***. CN VIII: Hearing grossly intact bilaterally. CN IX: No hypophonia. CN X: Palate elevates symmetrically. CN XI: Full strength shoulder shrug bilaterally. CN XII: Tongue protrusion full and midline. No atrophy or fasciculations. No significant dysarthria*** Motor: Tone is ***. *** fasciculations in *** extremities. *** atrophy. No grip or percussive myotonia.***  Individual muscle group testing (MRC  grade out of 5):  Movement     Neck flexion ***    Neck extension ***     Right Left   Shoulder abduction *** ***   Shoulder adduction *** ***   Shoulder ext rotation *** ***   Shoulder int rotation *** ***   Elbow flexion *** ***   Elbow extension *** ***   Wrist extension *** ***   Wrist flexion *** ***   Finger abduction - FDI *** ***   Finger abduction - ADM *** ***   Finger extension *** ***   Finger distal flexion - 2/3 *** ***   Finger distal flexion - 4/5 *** ***   Thumb flexion - FPL *** ***   Thumb abduction - APB *** ***    Hip flexion *** ***   Hip extension *** ***   Hip adduction *** ***   Hip abduction *** ***   Knee extension *** ***   Knee flexion *** ***   Dorsiflexion *** ***   Plantarflexion *** ***   Inversion *** ***   Eversion *** ***   Great toe extension *** ***   Great toe flexion *** ***     Reflexes:  Right Left   Bicep *** ***   Tricep *** ***   BrRad *** ***   Knee *** ***   Ankle *** ***    Pathological Reflexes: Babinski: *** response bilaterally*** Hoffman: *** Troemner: *** Pectoral: *** Palmomental: *** Facial: *** Midline tap: *** Sensation: Pinprick: *** Vibration: *** Temperature: *** Proprioception: *** Coordination: Intact finger-to- nose-finger bilaterally. Romberg negative.*** Gait: Able to rise from chair with arms crossed unassisted. Normal, narrow-based gait. Able to tandem walk. Able to walk on toes and heels.***  Lab and Test Review: Internal labs: 11/29/24: B12: 1046 Folate: low at 4.3 Ferritin: 202 CMP significant for glucose 102, BUN 24, Cr 1.20 CBC w/ diff significant for WBC 2.5, Hb 8.7, plts 134, MCV 89.8  10/03/24: Lipid panel: tChol 198, LDL 134, TG 94  External labs: ***  Imaging/Procedures: CT head wo contrast (10/11/22): FINDINGS: Brain: No evidence of acute infarction, hemorrhage, hydrocephalus, extra-axial collection or mass lesion/mass effect.   Vascular: No hyperdense vessel or  unexpected calcification.   Skull: No fracture or focal lesion.   Sinuses/Orbits: No acute finding.   Other: None.   IMPRESSION: No intracranial hemorrhage, large territory infarction, or mass effect.  MRI lumbar spine w/wo contrast (03/15/24): FINDINGS: Segmentation:  5 lumbar type vertebral bodies.   Alignment:  No malalignment.   Vertebrae: T12: Metastatic disease in the left inferior corner of the vertebral body with extension to the proximal pedicle.   L1: Metastatic disease in the right posterior vertebral body and right pedicle and diffusely through the posterior elements.   L2: Metastatic disease within the central right vertebral body. Focal involvement in the left posterior elements.   L3: Negative for tumor.   L4: Small foci of tumor within the vertebral body.   L5: Mild chronic loss of height anteriorly. Tumor within the anterior vertebral body. Tumor in the pedicles and posterior elements more extensive  on the right than the left.   Sacrum: Extensive tumor infiltration within S1 and S2 with involvement of the sacral ala regions, left more extensive than right.   Conus medullaris and cauda equina: Conus extends to the L1 level. Conus and cauda equina appear normal. No evidence of metastatic disease to the neural structures.   Paraspinal and other soft tissues: Negative   Disc levels:   No significant disc level finding from T12-L1 through L2-3.   L3-4: Mild disc bulge. Mild facet hypertrophy. No compressive stenosis.   L4-5: Moderate disc bulge. Mild facet and ligamentous hypertrophy. Mild stenosis of the lateral recesses with some potential for neural compression on the right.   L5-S1: Endplate osteophytes and shallow protrusion of the disc. No compressive effect upon the thecal sac or nerve roots. No foraminal nerve root compression.   IMPRESSION: 1. Extensive metastatic disease to the lumbar spine and sacrum as outlined above. No evidence of  acute worsening pathologic fracture. Some loss of height at anterior L5 which appears chronic. No evidence of metastatic disease to the neural structures. 2. L4-5: Moderate disc bulge. Mild facet and ligamentous hypertrophy. Mild stenosis of the lateral recesses with some potential for neural compression on the right. 3. L5-S1: Endplate osteophytes and shallow protrusion of the disc. No compressive effect upon the thecal sac or nerve roots. No foraminal nerve root compression. ***  ASSESSMENT: Craig Collins is a 59 y.o. male who presents for evaluation of ***. *** has a relevant medical history of ***. *** neurological examination is pertinent for ***. Available diagnostic data is significant for ***. This constellation of symptoms and objective data would most likely localize to ***. ***  *** ARPI (abiraterone, enzalutamide , apalutamide , and darolutamide) - low risk of neuropathy Docetaxel  - common cause of neuropathy Pluvicto  can cause paresthesia like symptoms ***  PLAN: -Blood work: *** ***  -Return to clinic ***  The impression above as well as the plan as outlined below were extensively discussed with the patient (in the company of ***) who voiced understanding. All questions were answered to their satisfaction.  The patient was counseled on pertinent fall precautions per the printed material provided today, and as noted under the Patient Instructions section below.***  When available, results of the above investigations and possible further recommendations will be communicated to the patient via telephone/MyChart. Patient to call office if not contacted after expected testing turnaround time.   Total time spent reviewing records, interview, history/exam, documentation, and coordination of care on day of encounter:  *** min   Thank you for allowing me to participate in patient's care.  If I can answer any additional questions, I would be pleased to do so.  Venetia Potters,  MD   CC: Oley Bascom RAMAN, NP [address] [place] [address]  CC: Referring provider: Tina Pauletta BROCKS, MD [address] [place],  [place] [address]     [1] No Known Allergies [2]  Social History Tobacco Use   Smoking status: Never    Passive exposure: Never   Smokeless tobacco: Never  Vaping Use   Vaping status: Never Used  Substance Use Topics   Alcohol use: Not Currently   Drug use: No   "

## 2025-01-06 ENCOUNTER — Ambulatory Visit: Admitting: Neurology

## 2025-02-06 ENCOUNTER — Inpatient Hospital Stay

## 2025-02-06 ENCOUNTER — Encounter (HOSPITAL_COMMUNITY)

## 2025-02-13 ENCOUNTER — Inpatient Hospital Stay

## 2025-04-03 ENCOUNTER — Ambulatory Visit: Payer: Self-pay | Admitting: Nurse Practitioner

## 2025-04-07 ENCOUNTER — Ambulatory Visit: Admitting: Neurology
